# Patient Record
Sex: Female | Born: 1940 | Race: White | Hispanic: No | Smoking: Never smoker
Health system: Southern US, Community
[De-identification: ages and names within clinical notes are randomized; demographics above are authoritative.]

## PROBLEM LIST (undated history)

## (undated) DIAGNOSIS — C50919 Malignant neoplasm of unspecified site of unspecified female breast: Secondary | ICD-10-CM

## (undated) DIAGNOSIS — K5792 Diverticulitis of intestine, part unspecified, without perforation or abscess without bleeding: Secondary | ICD-10-CM

## (undated) DIAGNOSIS — E785 Hyperlipidemia, unspecified: Secondary | ICD-10-CM

## (undated) DIAGNOSIS — Z86010 Personal history of colonic polyps: Secondary | ICD-10-CM

## (undated) DIAGNOSIS — Z923 Personal history of irradiation: Secondary | ICD-10-CM

## (undated) DIAGNOSIS — K5790 Diverticulosis of intestine, part unspecified, without perforation or abscess without bleeding: Secondary | ICD-10-CM

## (undated) DIAGNOSIS — R011 Cardiac murmur, unspecified: Secondary | ICD-10-CM

## (undated) DIAGNOSIS — J302 Other seasonal allergic rhinitis: Secondary | ICD-10-CM

## (undated) DIAGNOSIS — R202 Paresthesia of skin: Secondary | ICD-10-CM

## (undated) DIAGNOSIS — I10 Essential (primary) hypertension: Secondary | ICD-10-CM

## (undated) DIAGNOSIS — Z87440 Personal history of urinary (tract) infections: Secondary | ICD-10-CM

## (undated) DIAGNOSIS — M810 Age-related osteoporosis without current pathological fracture: Secondary | ICD-10-CM

## (undated) DIAGNOSIS — J189 Pneumonia, unspecified organism: Secondary | ICD-10-CM

## (undated) HISTORY — DX: Diverticulitis of intestine, part unspecified, without perforation or abscess without bleeding: K57.92

## (undated) HISTORY — PX: DILATION AND CURETTAGE OF UTERUS: SHX78

## (undated) HISTORY — PX: ABDOMINAL SURGERY: SHX537

## (undated) HISTORY — DX: Diverticulosis of intestine, part unspecified, without perforation or abscess without bleeding: K57.90

## (undated) HISTORY — DX: Malignant neoplasm of unspecified site of unspecified female breast: C50.919

## (undated) HISTORY — DX: Age-related osteoporosis without current pathological fracture: M81.0

## (undated) HISTORY — DX: Hyperlipidemia, unspecified: E78.5

## (undated) HISTORY — DX: Essential (primary) hypertension: I10

## (undated) HISTORY — PX: TONSILLECTOMY AND ADENOIDECTOMY: SUR1326

## (undated) HISTORY — PX: BREAST LUMPECTOMY: SHX2

## (undated) HISTORY — DX: Personal history of colonic polyps: Z86.010

---

## 1963-03-30 HISTORY — PX: ABDOMINAL HYSTERECTOMY: SHX81

## 1999-08-25 ENCOUNTER — Other Ambulatory Visit: Admission: RE | Admit: 1999-08-25 | Discharge: 1999-08-25 | Payer: Self-pay | Admitting: *Deleted

## 1999-10-30 ENCOUNTER — Encounter: Payer: Self-pay | Admitting: Family Medicine

## 1999-10-30 ENCOUNTER — Encounter: Admission: RE | Admit: 1999-10-30 | Discharge: 1999-10-30 | Payer: Self-pay | Admitting: Family Medicine

## 2000-09-01 ENCOUNTER — Other Ambulatory Visit: Admission: RE | Admit: 2000-09-01 | Discharge: 2000-09-01 | Payer: Self-pay | Admitting: *Deleted

## 2000-11-01 ENCOUNTER — Encounter: Admission: RE | Admit: 2000-11-01 | Discharge: 2000-11-01 | Payer: Self-pay | Admitting: Family Medicine

## 2000-11-01 ENCOUNTER — Encounter: Payer: Self-pay | Admitting: Family Medicine

## 2002-03-29 HISTORY — PX: COLONOSCOPY: SHX174

## 2002-04-11 ENCOUNTER — Encounter: Admission: RE | Admit: 2002-04-11 | Discharge: 2002-04-11 | Payer: Self-pay | Admitting: *Deleted

## 2002-04-16 ENCOUNTER — Encounter (INDEPENDENT_AMBULATORY_CARE_PROVIDER_SITE_OTHER): Payer: Self-pay | Admitting: Specialist

## 2002-04-16 ENCOUNTER — Other Ambulatory Visit: Admission: RE | Admit: 2002-04-16 | Discharge: 2002-04-16 | Payer: Self-pay | Admitting: Diagnostic Radiology

## 2002-04-16 ENCOUNTER — Encounter: Admission: RE | Admit: 2002-04-16 | Discharge: 2002-04-16 | Payer: Self-pay | Admitting: *Deleted

## 2002-04-16 HISTORY — PX: BREAST BIOPSY: SHX20

## 2002-04-24 ENCOUNTER — Encounter: Payer: Self-pay | Admitting: Surgery

## 2002-04-24 ENCOUNTER — Ambulatory Visit (HOSPITAL_COMMUNITY): Admission: RE | Admit: 2002-04-24 | Discharge: 2002-04-24 | Payer: Self-pay | Admitting: Surgery

## 2002-05-02 DIAGNOSIS — Z853 Personal history of malignant neoplasm of breast: Secondary | ICD-10-CM | POA: Insufficient documentation

## 2002-05-02 DIAGNOSIS — C50212 Malignant neoplasm of upper-inner quadrant of left female breast: Secondary | ICD-10-CM

## 2002-05-04 ENCOUNTER — Encounter: Payer: Self-pay | Admitting: Surgery

## 2002-05-04 ENCOUNTER — Encounter: Admission: RE | Admit: 2002-05-04 | Discharge: 2002-05-04 | Payer: Self-pay | Admitting: Surgery

## 2002-05-07 ENCOUNTER — Ambulatory Visit (HOSPITAL_BASED_OUTPATIENT_CLINIC_OR_DEPARTMENT_OTHER): Admission: RE | Admit: 2002-05-07 | Discharge: 2002-05-07 | Payer: Self-pay | Admitting: Surgery

## 2002-05-07 ENCOUNTER — Encounter: Admission: RE | Admit: 2002-05-07 | Discharge: 2002-05-07 | Payer: Self-pay | Admitting: Surgery

## 2002-05-07 ENCOUNTER — Encounter: Payer: Self-pay | Admitting: Surgery

## 2002-05-07 ENCOUNTER — Encounter (INDEPENDENT_AMBULATORY_CARE_PROVIDER_SITE_OTHER): Payer: Self-pay | Admitting: *Deleted

## 2002-05-17 ENCOUNTER — Ambulatory Visit: Admission: RE | Admit: 2002-05-17 | Discharge: 2002-06-11 | Payer: Self-pay | Admitting: Radiation Oncology

## 2002-06-07 ENCOUNTER — Encounter: Payer: Self-pay | Admitting: Oncology

## 2002-06-07 ENCOUNTER — Ambulatory Visit (HOSPITAL_COMMUNITY): Admission: RE | Admit: 2002-06-07 | Discharge: 2002-06-07 | Payer: Self-pay | Admitting: Oncology

## 2002-06-15 ENCOUNTER — Ambulatory Visit (HOSPITAL_COMMUNITY): Admission: RE | Admit: 2002-06-15 | Discharge: 2002-09-13 | Payer: Self-pay | Admitting: Radiation Oncology

## 2002-06-28 DIAGNOSIS — Z923 Personal history of irradiation: Secondary | ICD-10-CM

## 2002-06-28 HISTORY — DX: Personal history of irradiation: Z92.3

## 2002-08-20 ENCOUNTER — Encounter: Admission: RE | Admit: 2002-08-20 | Discharge: 2002-11-18 | Payer: Self-pay | Admitting: Radiation Oncology

## 2002-09-19 ENCOUNTER — Encounter: Payer: Self-pay | Admitting: Oncology

## 2002-09-19 ENCOUNTER — Ambulatory Visit (HOSPITAL_COMMUNITY): Admission: RE | Admit: 2002-09-19 | Discharge: 2002-09-19 | Payer: Self-pay | Admitting: Oncology

## 2003-03-06 ENCOUNTER — Encounter: Admission: RE | Admit: 2003-03-06 | Discharge: 2003-03-06 | Payer: Self-pay | Admitting: Oncology

## 2003-04-09 ENCOUNTER — Other Ambulatory Visit: Admission: RE | Admit: 2003-04-09 | Discharge: 2003-04-09 | Payer: Self-pay | Admitting: Obstetrics and Gynecology

## 2003-04-25 ENCOUNTER — Ambulatory Visit: Admission: RE | Admit: 2003-04-25 | Discharge: 2003-04-25 | Payer: Self-pay | Admitting: Radiation Oncology

## 2004-03-16 ENCOUNTER — Encounter: Admission: RE | Admit: 2004-03-16 | Discharge: 2004-03-16 | Payer: Self-pay | Admitting: Oncology

## 2004-03-25 ENCOUNTER — Ambulatory Visit: Payer: Self-pay | Admitting: Oncology

## 2004-07-31 ENCOUNTER — Ambulatory Visit: Payer: Self-pay | Admitting: Oncology

## 2004-10-12 ENCOUNTER — Ambulatory Visit: Payer: Self-pay | Admitting: Internal Medicine

## 2004-10-27 ENCOUNTER — Ambulatory Visit: Payer: Self-pay | Admitting: Family Medicine

## 2004-11-16 ENCOUNTER — Encounter: Admission: RE | Admit: 2004-11-16 | Discharge: 2005-02-14 | Payer: Self-pay | Admitting: Internal Medicine

## 2004-12-14 ENCOUNTER — Emergency Department (HOSPITAL_COMMUNITY): Admission: EM | Admit: 2004-12-14 | Discharge: 2004-12-14 | Payer: Self-pay | Admitting: Emergency Medicine

## 2005-02-16 ENCOUNTER — Ambulatory Visit: Payer: Self-pay | Admitting: Oncology

## 2005-03-17 ENCOUNTER — Encounter: Admission: RE | Admit: 2005-03-17 | Discharge: 2005-03-17 | Payer: Self-pay | Admitting: Oncology

## 2005-06-29 ENCOUNTER — Encounter: Admission: RE | Admit: 2005-06-29 | Discharge: 2005-06-29 | Payer: Self-pay | Admitting: Surgery

## 2005-08-10 ENCOUNTER — Ambulatory Visit: Payer: Self-pay | Admitting: Oncology

## 2005-08-12 LAB — CBC WITH DIFFERENTIAL/PLATELET
Basophils Absolute: 0.1 10*3/uL (ref 0.0–0.1)
EOS%: 2.7 % (ref 0.0–7.0)
Eosinophils Absolute: 0.2 10*3/uL (ref 0.0–0.5)
HCT: 37.8 % (ref 34.8–46.6)
HGB: 13 g/dL (ref 11.6–15.9)
MCH: 29.1 pg (ref 26.0–34.0)
MONO#: 0.6 10*3/uL (ref 0.1–0.9)
NEUT#: 4.6 10*3/uL (ref 1.5–6.5)
NEUT%: 62.3 % (ref 39.6–76.8)
RDW: 15.9 % — ABNORMAL HIGH (ref 11.3–14.5)
WBC: 7.4 10*3/uL (ref 3.9–10.0)
lymph#: 1.9 10*3/uL (ref 0.9–3.3)

## 2005-08-12 LAB — COMPREHENSIVE METABOLIC PANEL
AST: 15 U/L (ref 0–37)
Albumin: 4.3 g/dL (ref 3.5–5.2)
Alkaline Phosphatase: 93 U/L (ref 39–117)
Calcium: 9.5 mg/dL (ref 8.4–10.5)
Chloride: 100 mEq/L (ref 96–112)
Glucose, Bld: 96 mg/dL (ref 70–99)
Potassium: 4 mEq/L (ref 3.5–5.3)
Sodium: 140 mEq/L (ref 135–145)
Total Protein: 7.3 g/dL (ref 6.0–8.3)

## 2006-02-09 ENCOUNTER — Ambulatory Visit: Payer: Self-pay | Admitting: Oncology

## 2006-02-09 ENCOUNTER — Ambulatory Visit: Payer: Self-pay | Admitting: Internal Medicine

## 2006-02-09 LAB — CONVERTED CEMR LAB
ALT: 16 units/L (ref 0–40)
AST: 17 units/L (ref 0–37)
BUN: 17 mg/dL (ref 6–23)
Basophils Relative: 0.9 % (ref 0.0–1.0)
Eosinophil percent: 2.8 % (ref 0.0–5.0)
Hgb A1c MFr Bld: 6.5 % — ABNORMAL HIGH (ref 4.6–6.0)
Lymphocytes Relative: 25.4 % (ref 12.0–46.0)
Microalb Creat Ratio: 2.3 mg/g (ref 0.0–30.0)
Neutro Abs: 4.7 10*3/uL (ref 1.4–7.7)
Platelets: 312 10*3/uL (ref 150–400)
RBC: 4.52 M/uL (ref 3.87–5.11)
RDW: 14 % (ref 11.5–14.6)
WBC: 7.3 10*3/uL (ref 4.5–10.5)

## 2006-02-14 LAB — CBC WITH DIFFERENTIAL/PLATELET
Basophils Absolute: 0 10*3/uL (ref 0.0–0.1)
EOS%: 2 % (ref 0.0–7.0)
Eosinophils Absolute: 0.2 10*3/uL (ref 0.0–0.5)
HCT: 35 % (ref 34.8–46.6)
HGB: 12 g/dL (ref 11.6–15.9)
MCH: 29.5 pg (ref 26.0–34.0)
MCV: 86.1 fL (ref 81.0–101.0)
NEUT#: 5.4 10*3/uL (ref 1.5–6.5)
NEUT%: 68.5 % (ref 39.6–76.8)
RDW: 14.7 % — ABNORMAL HIGH (ref 11.3–14.5)
lymph#: 1.7 10*3/uL (ref 0.9–3.3)

## 2006-02-14 LAB — LACTATE DEHYDROGENASE: LDH: 156 U/L (ref 94–250)

## 2006-02-14 LAB — COMPREHENSIVE METABOLIC PANEL
BUN: 18 mg/dL (ref 6–23)
CO2: 30 mEq/L (ref 19–32)
Calcium: 9.3 mg/dL (ref 8.4–10.5)
Chloride: 103 mEq/L (ref 96–112)
Creatinine, Ser: 0.75 mg/dL (ref 0.40–1.20)
Glucose, Bld: 114 mg/dL — ABNORMAL HIGH (ref 70–99)
Total Bilirubin: 0.3 mg/dL (ref 0.3–1.2)

## 2006-03-02 ENCOUNTER — Ambulatory Visit: Payer: Self-pay | Admitting: Internal Medicine

## 2006-03-17 ENCOUNTER — Encounter: Admission: RE | Admit: 2006-03-17 | Discharge: 2006-03-17 | Payer: Self-pay | Admitting: Oncology

## 2006-06-02 ENCOUNTER — Ambulatory Visit: Payer: Self-pay | Admitting: Internal Medicine

## 2006-06-02 LAB — CONVERTED CEMR LAB
ALT: 16 units/L (ref 0–40)
AST: 18 units/L (ref 0–37)
Hgb A1c MFr Bld: 6.6 % — ABNORMAL HIGH (ref 4.6–6.0)
Total CHOL/HDL Ratio: 3.9
Triglycerides: 151 mg/dL — ABNORMAL HIGH (ref 0–149)
VLDL: 30 mg/dL (ref 0–40)

## 2006-06-10 ENCOUNTER — Ambulatory Visit: Payer: Self-pay | Admitting: Internal Medicine

## 2006-08-19 ENCOUNTER — Ambulatory Visit: Payer: Self-pay | Admitting: Oncology

## 2006-08-24 LAB — COMPREHENSIVE METABOLIC PANEL
BUN: 17 mg/dL (ref 6–23)
CO2: 24 mEq/L (ref 19–32)
Creatinine, Ser: 0.68 mg/dL (ref 0.40–1.20)
Glucose, Bld: 98 mg/dL (ref 70–99)
Total Bilirubin: 0.3 mg/dL (ref 0.3–1.2)

## 2006-08-24 LAB — CBC WITH DIFFERENTIAL/PLATELET
BASO%: 0.5 % (ref 0.0–2.0)
Basophils Absolute: 0 10*3/uL (ref 0.0–0.1)
Eosinophils Absolute: 0.2 10*3/uL (ref 0.0–0.5)
HCT: 36.2 % (ref 34.8–46.6)
LYMPH%: 23.1 % (ref 14.0–48.0)
MCHC: 34.5 g/dL (ref 32.0–36.0)
MONO#: 0.6 10*3/uL (ref 0.1–0.9)
NEUT%: 64.7 % (ref 39.6–76.8)
Platelets: 250 10*3/uL (ref 145–400)
WBC: 6.7 10*3/uL (ref 3.9–10.0)

## 2006-08-24 LAB — LACTATE DEHYDROGENASE: LDH: 166 U/L (ref 94–250)

## 2006-08-24 LAB — CANCER ANTIGEN 27.29: CA 27.29: 9 U/mL (ref 0–39)

## 2006-08-31 ENCOUNTER — Encounter: Payer: Self-pay | Admitting: Internal Medicine

## 2006-09-13 ENCOUNTER — Ambulatory Visit: Payer: Self-pay | Admitting: Internal Medicine

## 2006-09-16 ENCOUNTER — Encounter (INDEPENDENT_AMBULATORY_CARE_PROVIDER_SITE_OTHER): Payer: Self-pay | Admitting: *Deleted

## 2006-09-16 LAB — CONVERTED CEMR LAB
Creatinine,U: 179.7 mg/dL
Hgb A1c MFr Bld: 6.5 % — ABNORMAL HIGH (ref 4.6–6.0)
LDL Cholesterol: 98 mg/dL (ref 0–99)
Microalb Creat Ratio: 3.3 mg/g (ref 0.0–30.0)
VLDL: 34 mg/dL (ref 0–40)

## 2006-10-03 ENCOUNTER — Ambulatory Visit: Payer: Self-pay | Admitting: Internal Medicine

## 2006-10-25 ENCOUNTER — Encounter: Payer: Self-pay | Admitting: Internal Medicine

## 2006-10-27 ENCOUNTER — Telehealth (INDEPENDENT_AMBULATORY_CARE_PROVIDER_SITE_OTHER): Payer: Self-pay | Admitting: *Deleted

## 2006-11-25 ENCOUNTER — Encounter (INDEPENDENT_AMBULATORY_CARE_PROVIDER_SITE_OTHER): Payer: Self-pay | Admitting: *Deleted

## 2006-11-29 ENCOUNTER — Encounter: Admission: RE | Admit: 2006-11-29 | Discharge: 2006-11-29 | Payer: Self-pay | Admitting: Oncology

## 2006-11-29 ENCOUNTER — Ambulatory Visit: Payer: Self-pay | Admitting: Internal Medicine

## 2006-12-02 ENCOUNTER — Encounter (INDEPENDENT_AMBULATORY_CARE_PROVIDER_SITE_OTHER): Payer: Self-pay | Admitting: *Deleted

## 2007-02-06 ENCOUNTER — Ambulatory Visit: Payer: Self-pay | Admitting: Internal Medicine

## 2007-02-06 LAB — CONVERTED CEMR LAB
AST: 19 units/L (ref 0–37)
BUN: 14 mg/dL (ref 6–23)
Cholesterol: 196 mg/dL (ref 0–200)
HDL: 62.4 mg/dL (ref >39.0)
LDL Cholesterol: 96 mg/dL (ref 0–99)
Potassium: 4 meq/L (ref 3.5–5.1)
Triglycerides: 188 mg/dL — ABNORMAL HIGH (ref 0–149)
VLDL: 38 mg/dL (ref 0–40)

## 2007-02-10 DIAGNOSIS — I10 Essential (primary) hypertension: Secondary | ICD-10-CM | POA: Insufficient documentation

## 2007-02-10 DIAGNOSIS — Z9089 Acquired absence of other organs: Secondary | ICD-10-CM | POA: Insufficient documentation

## 2007-02-10 DIAGNOSIS — I152 Hypertension secondary to endocrine disorders: Secondary | ICD-10-CM | POA: Insufficient documentation

## 2007-02-13 ENCOUNTER — Ambulatory Visit: Payer: Self-pay | Admitting: Internal Medicine

## 2007-02-13 DIAGNOSIS — R7303 Prediabetes: Secondary | ICD-10-CM | POA: Insufficient documentation

## 2007-02-15 ENCOUNTER — Ambulatory Visit: Payer: Self-pay | Admitting: Oncology

## 2007-02-17 LAB — COMPREHENSIVE METABOLIC PANEL
AST: 15 U/L (ref 0–37)
Alkaline Phosphatase: 84 U/L (ref 39–117)
BUN: 21 mg/dL (ref 6–23)
Creatinine, Ser: 0.73 mg/dL (ref 0.40–1.20)
Potassium: 4.1 mEq/L (ref 3.5–5.3)
Total Bilirubin: 0.3 mg/dL (ref 0.3–1.2)

## 2007-02-17 LAB — CBC WITH DIFFERENTIAL/PLATELET
Basophils Absolute: 0 10*3/uL (ref 0.0–0.1)
EOS%: 2.1 % (ref 0.0–7.0)
Eosinophils Absolute: 0.2 10*3/uL (ref 0.0–0.5)
HGB: 12.2 g/dL (ref 11.6–15.9)
NEUT#: 4.7 10*3/uL (ref 1.5–6.5)
RDW: 15.4 % — ABNORMAL HIGH (ref 11.3–14.5)
lymph#: 1.8 10*3/uL (ref 0.9–3.3)

## 2007-02-21 ENCOUNTER — Encounter: Payer: Self-pay | Admitting: Internal Medicine

## 2007-02-21 LAB — VITAMIN D PNL(25-HYDRXY+1,25-DIHY)-BLD
Vit D, 1,25-Dihydroxy: 33 pg/mL (ref 6–62)
Vit D, 25-Hydroxy: 39 ng/mL (ref 30–89)

## 2007-03-20 ENCOUNTER — Encounter: Admission: RE | Admit: 2007-03-20 | Discharge: 2007-03-20 | Payer: Self-pay | Admitting: Oncology

## 2007-05-08 ENCOUNTER — Encounter: Payer: Self-pay | Admitting: Internal Medicine

## 2007-07-19 ENCOUNTER — Telehealth (INDEPENDENT_AMBULATORY_CARE_PROVIDER_SITE_OTHER): Payer: Self-pay | Admitting: *Deleted

## 2007-08-01 ENCOUNTER — Encounter: Payer: Self-pay | Admitting: Internal Medicine

## 2007-08-01 ENCOUNTER — Telehealth (INDEPENDENT_AMBULATORY_CARE_PROVIDER_SITE_OTHER): Payer: Self-pay | Admitting: *Deleted

## 2007-08-09 ENCOUNTER — Ambulatory Visit: Payer: Self-pay | Admitting: Internal Medicine

## 2007-08-09 LAB — CONVERTED CEMR LAB
Cholesterol: 209 mg/dL (ref 0–200)
Total CHOL/HDL Ratio: 3.6
Triglycerides: 131 mg/dL (ref 0–149)
VLDL: 26 mg/dL (ref 0–40)

## 2007-08-16 ENCOUNTER — Ambulatory Visit: Payer: Self-pay | Admitting: Internal Medicine

## 2007-08-16 DIAGNOSIS — T887XXA Unspecified adverse effect of drug or medicament, initial encounter: Secondary | ICD-10-CM | POA: Insufficient documentation

## 2007-08-16 LAB — CONVERTED CEMR LAB
Cholesterol, target level: 200 mg/dL
LDL Goal: 100 mg/dL

## 2007-08-17 ENCOUNTER — Ambulatory Visit: Payer: Self-pay | Admitting: Oncology

## 2007-09-01 ENCOUNTER — Encounter: Payer: Self-pay | Admitting: Internal Medicine

## 2007-12-18 ENCOUNTER — Ambulatory Visit: Payer: Self-pay | Admitting: Internal Medicine

## 2007-12-25 ENCOUNTER — Ambulatory Visit: Payer: Self-pay | Admitting: Internal Medicine

## 2007-12-25 DIAGNOSIS — E1169 Type 2 diabetes mellitus with other specified complication: Secondary | ICD-10-CM | POA: Insufficient documentation

## 2007-12-25 DIAGNOSIS — E785 Hyperlipidemia, unspecified: Secondary | ICD-10-CM | POA: Insufficient documentation

## 2007-12-25 LAB — CONVERTED CEMR LAB
Cholesterol: 182 mg/dL (ref 0–200)
Creatinine, Ser: 0.7 mg/dL (ref 0.4–1.2)
HDL: 59.9 mg/dL (ref >39.0)
Total CHOL/HDL Ratio: 3
Triglycerides: 128 mg/dL (ref 0–149)

## 2008-03-26 ENCOUNTER — Encounter: Admission: RE | Admit: 2008-03-26 | Discharge: 2008-03-26 | Payer: Self-pay | Admitting: Oncology

## 2008-04-16 ENCOUNTER — Encounter: Payer: Self-pay | Admitting: Internal Medicine

## 2008-04-23 ENCOUNTER — Ambulatory Visit: Payer: Self-pay | Admitting: Internal Medicine

## 2008-04-28 LAB — CONVERTED CEMR LAB: Hgb A1c MFr Bld: 6.5 % — ABNORMAL HIGH (ref 4.6–6.0)

## 2008-04-29 ENCOUNTER — Encounter: Payer: Self-pay | Admitting: Internal Medicine

## 2008-05-01 ENCOUNTER — Encounter: Payer: Self-pay | Admitting: Internal Medicine

## 2008-08-22 ENCOUNTER — Ambulatory Visit: Payer: Self-pay | Admitting: Internal Medicine

## 2008-08-24 LAB — CONVERTED CEMR LAB: Hgb A1c MFr Bld: 6.4 % (ref 4.6–6.5)

## 2008-08-29 ENCOUNTER — Encounter (INDEPENDENT_AMBULATORY_CARE_PROVIDER_SITE_OTHER): Payer: Self-pay | Admitting: *Deleted

## 2008-09-06 ENCOUNTER — Ambulatory Visit: Payer: Self-pay | Admitting: Oncology

## 2008-09-06 LAB — CBC WITH DIFFERENTIAL/PLATELET
Basophils Absolute: 0 10*3/uL (ref 0.0–0.1)
EOS%: 3.5 % (ref 0.0–7.0)
Eosinophils Absolute: 0.2 10*3/uL (ref 0.0–0.5)
LYMPH%: 26 % (ref 14.0–49.7)
MCH: 29.7 pg (ref 25.1–34.0)
MCV: 85.6 fL (ref 79.5–101.0)
MONO%: 8.5 % (ref 0.0–14.0)
NEUT#: 3.9 10*3/uL (ref 1.5–6.5)
Platelets: 215 10*3/uL (ref 145–400)
RBC: 4.12 10*6/uL (ref 3.70–5.45)

## 2008-09-09 LAB — COMPREHENSIVE METABOLIC PANEL
AST: 17 U/L (ref 0–37)
Alkaline Phosphatase: 67 U/L (ref 39–117)
BUN: 21 mg/dL (ref 6–23)
Glucose, Bld: 85 mg/dL (ref 70–99)
Sodium: 142 mEq/L (ref 135–145)
Total Bilirubin: 0.5 mg/dL (ref 0.3–1.2)

## 2008-09-09 LAB — VITAMIN D 25 HYDROXY (VIT D DEFICIENCY, FRACTURES): Vit D, 25-Hydroxy: 38 ng/mL (ref 30–89)

## 2008-09-13 ENCOUNTER — Encounter: Payer: Self-pay | Admitting: Internal Medicine

## 2008-11-06 ENCOUNTER — Telehealth (INDEPENDENT_AMBULATORY_CARE_PROVIDER_SITE_OTHER): Payer: Self-pay | Admitting: *Deleted

## 2008-12-25 ENCOUNTER — Ambulatory Visit: Payer: Self-pay | Admitting: Internal Medicine

## 2008-12-25 LAB — CONVERTED CEMR LAB
ALT: 16 units/L (ref 0–35)
AST: 21 units/L (ref 0–37)
Albumin: 3.8 g/dL (ref 3.5–5.2)
Alkaline Phosphatase: 72 units/L (ref 39–117)
Cholesterol: 177 mg/dL (ref 0–200)
Creatinine, Ser: 0.8 mg/dL (ref 0.4–1.2)
Creatinine,U: 208.8 mg/dL
HDL: 57.4 mg/dL (ref >39.00)
Microalb Creat Ratio: 4.8 mg/g (ref 0.0–30.0)
TSH: 1.55 microintl units/mL (ref 0.35–5.50)
Total Protein: 7.3 g/dL (ref 6.0–8.3)
Triglycerides: 112 mg/dL (ref 0.0–149.0)

## 2009-01-02 ENCOUNTER — Ambulatory Visit: Payer: Self-pay | Admitting: Internal Medicine

## 2009-03-27 ENCOUNTER — Encounter: Admission: RE | Admit: 2009-03-27 | Discharge: 2009-03-27 | Payer: Self-pay | Admitting: Oncology

## 2009-04-18 ENCOUNTER — Encounter: Payer: Self-pay | Admitting: Internal Medicine

## 2009-07-03 ENCOUNTER — Ambulatory Visit: Payer: Self-pay | Admitting: Internal Medicine

## 2009-07-07 LAB — CONVERTED CEMR LAB: Hgb A1c MFr Bld: 6.2 % (ref 4.6–6.5)

## 2009-08-21 ENCOUNTER — Encounter: Payer: Self-pay | Admitting: Internal Medicine

## 2009-09-03 ENCOUNTER — Ambulatory Visit: Payer: Self-pay | Admitting: Oncology

## 2009-09-05 LAB — CBC WITH DIFFERENTIAL/PLATELET
BASO%: 0.3 % (ref 0.0–2.0)
Eosinophils Absolute: 0.2 10*3/uL (ref 0.0–0.5)
HCT: 35.5 % (ref 34.8–46.6)
MCHC: 34 g/dL (ref 31.5–36.0)
MONO#: 0.5 10*3/uL (ref 0.1–0.9)
NEUT#: 3.8 10*3/uL (ref 1.5–6.5)
NEUT%: 65.2 % (ref 38.4–76.8)
RBC: 4.08 10*6/uL (ref 3.70–5.45)
WBC: 5.9 10*3/uL (ref 3.9–10.3)
lymph#: 1.4 10*3/uL (ref 0.9–3.3)

## 2009-09-05 LAB — COMPREHENSIVE METABOLIC PANEL
ALT: 13 U/L (ref 0–35)
CO2: 25 mEq/L (ref 19–32)
Calcium: 9.5 mg/dL (ref 8.4–10.5)
Chloride: 104 mEq/L (ref 96–112)
Glucose, Bld: 104 mg/dL — ABNORMAL HIGH (ref 70–99)
Sodium: 139 mEq/L (ref 135–145)
Total Protein: 6.7 g/dL (ref 6.0–8.3)

## 2009-09-05 LAB — LACTATE DEHYDROGENASE: LDH: 159 U/L (ref 94–250)

## 2009-09-12 ENCOUNTER — Encounter: Payer: Self-pay | Admitting: Internal Medicine

## 2010-01-08 ENCOUNTER — Ambulatory Visit: Payer: Self-pay | Admitting: Internal Medicine

## 2010-01-19 LAB — CONVERTED CEMR LAB: Hgb A1c MFr Bld: 6.5 % (ref 4.6–6.5)

## 2010-01-28 ENCOUNTER — Ambulatory Visit: Payer: Self-pay | Admitting: Internal Medicine

## 2010-01-28 DIAGNOSIS — M1990 Unspecified osteoarthritis, unspecified site: Secondary | ICD-10-CM | POA: Insufficient documentation

## 2010-01-30 LAB — CONVERTED CEMR LAB: Uric Acid, Serum: 5.9 mg/dL (ref 2.4–7.0)

## 2010-03-10 ENCOUNTER — Telehealth (INDEPENDENT_AMBULATORY_CARE_PROVIDER_SITE_OTHER): Payer: Self-pay | Admitting: *Deleted

## 2010-04-02 ENCOUNTER — Encounter
Admission: RE | Admit: 2010-04-02 | Discharge: 2010-04-02 | Payer: Self-pay | Source: Home / Self Care | Attending: Oncology | Admitting: Oncology

## 2010-04-23 ENCOUNTER — Encounter: Payer: Self-pay | Admitting: Internal Medicine

## 2010-04-28 NOTE — Letter (Signed)
Summary: [facility]   Imported By: Lanelle Bal 05/06/2009 09:19:29  _____________________________________________________________________  External Attachment:    Type:   Image     Comment:   External Document

## 2010-04-28 NOTE — Letter (Signed)
[facility]   Imported By: Lanelle Bal 10/06/2009 09:09:00  _____________________________________________________________________  External Attachment:    Type:   Image     Comment:   External Document

## 2010-04-28 NOTE — Letter (Signed)
Summary: Tiffany Hood OD  Tiffany Hood OD   Imported By: Lanelle Bal 08/30/2009 09:38:38  _____________________________________________________________________  External Attachment:    Type:   Image     Comment:   External Document

## 2010-04-28 NOTE — Assessment & Plan Note (Signed)
Summary: review meds and glucose readings, fasting labs///sph   Vital Signs:  Patient profile:   70 year old female Height:      63 inches Weight:      152.4 pounds BMI:     27.09 Pulse rate:   72 / minute Resp:     15 per minute BP sitting:   118 / 70  (left arm) Cuff size:   large  Vitals Entered By: Shonna Chock CMA (January 28, 2010 9:30 AM) CC: Follow-up visit: Discuss meds and bloodsugar readings, fasting if any labs due, Type 2 diabetes mellitus follow-up Comments **Off Actos since Aug due to Info released concerning- relation to Bladder Cancer   CC:  Follow-up visit: Discuss meds and bloodsugar readings, fasting if any labs due, and Type 2 diabetes mellitus follow-up.  History of Present Illness: Type 2 Diabetes Mellitus Follow-Up      This is a 70 year old woman who presents for Type 2 diabetes mellitus follow-up.  The patient reports polyuria ( ? from diuretic) and weight loss ( 4-5# with diet change & exercise), but denies polydipsia, blurred vision, self managed hypoglycemia, and numbness of extremities.  The patient denies the following symptoms: neuropathic pain, chest pain, vomiting, orthostatic symptoms, poor wound healing, intermittent claudication, vision loss, and foot ulcer.  Since the last visit the patient reports good dietary compliance and exercising regularly(3X/ week as [employer]).  The patient has been measuring capillary blood glucose before breakfast( 92-117).  Since the last visit, the patient reports having had eye care by an Ophthalmologist ( no retinopathy) but  no foot care.  She is concerned about a "hereditary lump" ( F,bro, sister all had this; none had gout) which hurts occasionally. A1c 6.5% on 01/08/2010; it was 6.2% six months ago.She D/Ced Actos 08/2011because of possible bladder cancer from Actos . She has had breast cancer.  Allergies (verified): No Known Drug Allergies  Review of Systems MS:  Complains of joint pain, joint redness, and joint  swelling; Rx: massage .  Physical Exam  General:  well-nourished,in no acute distress; alert,appropriate and cooperative throughout examination Lungs:  Normal respiratory effort, chest expands symmetrically. Lungs are clear to auscultation, no crackles or wheezes. Heart:  normal rate, regular rhythm, no gallop, no rub, no JVD, no HJR, and grade 1 /6 systolic murmur.   Abdomen:  Bowel sounds positive,abdomen soft and non-tender without masses, organomegaly or hernias noted. No AAA or bruits Pulses:  R and L carotid,radial,dorsalis pedis and posterior tibial pulses are full and equal bilaterally Extremities:  No clubbing, cyanosis, edema. Classic OA hand changes. Minimal crepitus of kness . Full ROM . Osteoma  L great toe. Pes planus. Good nail health Neurologic:  alert & oriented X3 and sensation intact to light touch over feet.   Skin:  Intact without suspicious lesions or rashes Psych:  memory intact for recent and remote, normally interactive, and good eye contact.     Impression & Recommendations:  Problem # 1:  DIABETES MELLITUS, TYPE II, CONTROLLED (ICD-250.00)  The following medications were removed from the medication list:    Actos 30 Mg Tabs (Pioglitazone hcl) .Marland Kitchen... 1 by mouth qd Her updated medication list for this problem includes:    Cozaar 50 Mg Tabs (Losartan potassium) .Marland Kitchen... 1 by mouth qd    Adult Aspirin Low Strength 81 Mg Tbdp (Aspirin) .Marland Kitchen... 1 on m,w,f  Problem # 2:  ARTHRALGIA (ICD-719.40)  R/O gout ( doubt); probabled benign Osteoma   Orders: Venipuncture (  16109) TLB-Uric Acid, Blood (84550-URIC) T-Toe(s) (73660TC)  Complete Medication List: 1)  Triamterene-hctz 75-50 Mg Tabs (Triamterene-hctz) .... 1/2 tab daily 2)  Cozaar 50 Mg Tabs (Losartan potassium) .Marland Kitchen.. 1 by mouth qd 3)  Adult Aspirin Low Strength 81 Mg Tbdp (Aspirin) .Marland Kitchen.. 1 on m,w,f 4)  Arimidex 1 Mg Tabs (Anastrozole) .Marland Kitchen.. 1 by mouth qd 5)  Pravastatin Sodium 40 Mg Tabs (Pravastatin sodium) .Marland Kitchen.. 1  by mouth once daily **appointment due** 6)  Caltate With Vit D  7)  Vitamin E 400 Iu  8)  Folic Acid 1 Mg Tabs (Folic acid) .Marland Kitchen.. 1 by mouth once daily 9)  Multivitamin  10)  Fish Oil 1gram Qd  11)  Onetouch Ultra Test Strp (Glucose blood) .... Use as directed once daily 12)  Onetouch Ultrasoft Lancets Misc (Lancets) .... Use as directed once daily 13)  Phisohex 3 % Liqd (Hexachlorophene) .... Mouthwash: use two times a day for tooth implant  Patient Instructions: 1)  Consume < 30 grams of HFCS sugar/ day. Wear support shoes with arch supports. 2)  Please schedule a follow-up appointment in 3 months. 3)  BUN,creat, K+ prior to visit, ICD-9:401.9 4)  Lipid Panel prior to visit, ICD-9:272.4 5)  HbgA1C prior to visit, ICD-9:250.00 6)  Hepatic Panel prior to visit, ICD-9:995.20 7)  TSH prior to visit, ICD-9:244.9.   Orders Added: 1)  Est. Patient Level IV [60454] 2)  Venipuncture [09811] 3)  TLB-Uric Acid, Blood [84550-URIC] 4)  T-Toe(s) [73660TC]

## 2010-04-30 ENCOUNTER — Other Ambulatory Visit: Payer: Self-pay | Admitting: Internal Medicine

## 2010-04-30 ENCOUNTER — Other Ambulatory Visit (INDEPENDENT_AMBULATORY_CARE_PROVIDER_SITE_OTHER): Payer: Medicare Other

## 2010-04-30 ENCOUNTER — Ambulatory Visit: Admit: 2010-04-30 | Payer: Self-pay | Admitting: Internal Medicine

## 2010-04-30 ENCOUNTER — Encounter (INDEPENDENT_AMBULATORY_CARE_PROVIDER_SITE_OTHER): Payer: Self-pay | Admitting: *Deleted

## 2010-04-30 ENCOUNTER — Other Ambulatory Visit: Payer: Self-pay | Admitting: Surgery

## 2010-04-30 DIAGNOSIS — E785 Hyperlipidemia, unspecified: Secondary | ICD-10-CM

## 2010-04-30 DIAGNOSIS — Z9889 Other specified postprocedural states: Secondary | ICD-10-CM

## 2010-04-30 DIAGNOSIS — E119 Type 2 diabetes mellitus without complications: Secondary | ICD-10-CM

## 2010-04-30 DIAGNOSIS — E039 Hypothyroidism, unspecified: Secondary | ICD-10-CM

## 2010-04-30 LAB — CREATININE, SERUM: Creatinine, Ser: 0.6 mg/dL (ref 0.4–1.2)

## 2010-04-30 LAB — LDL CHOLESTEROL, DIRECT: Direct LDL: 98 mg/dL

## 2010-04-30 LAB — HEPATIC FUNCTION PANEL
Alkaline Phosphatase: 83 U/L (ref 39–117)
Bilirubin, Direct: 0.1 mg/dL (ref 0.0–0.3)
Total Protein: 7.1 g/dL (ref 6.0–8.3)

## 2010-04-30 LAB — LIPID PANEL
HDL: 57.8 mg/dL (ref >39.00)
Total CHOL/HDL Ratio: 3
VLDL: 46.6 mg/dL — ABNORMAL HIGH (ref 0.0–40.0)

## 2010-04-30 LAB — HEMOGLOBIN A1C: Hgb A1c MFr Bld: 6.6 % — ABNORMAL HIGH (ref 4.6–6.5)

## 2010-04-30 NOTE — Progress Notes (Signed)
Summary: Losartan 90 day prescription plus refills  Phone Note Refill Request Message from:  Patient on March 10, 2010 11:51 AM  Refills Requested: Medication #1:  COZAAR 50 MG  TABS 1 by mouth qd needs 90 day prescription plus refills----uses mail order pharmacy, but prefers to pick up prescription and mail it herself---I told her it would be ready in 24 hours (Wednesday after lunch) unless she heard from [place]  Next Appointment Scheduled: Thurs 04/30/2010--lab only Initial call taken by: Jerolyn Shin,  March 10, 2010 11:53 AM    Prescriptions: COZAAR 50 MG  TABS (LOSARTAN POTASSIUM) 1 by mouth qd  #90 x 2   Entered by:   Shonna Chock CMA   Authorized by:   Marga Melnick MD   Signed by:   Shonna Chock CMA on 03/10/2010   Method used:   Print then Give to Patient   RxID:   [id number]

## 2010-05-07 ENCOUNTER — Ambulatory Visit
Admission: RE | Admit: 2010-05-07 | Discharge: 2010-05-07 | Disposition: A | Discharge status: Home / Self Care | Payer: Medicare Other | Source: Ambulatory Visit | Attending: Surgery | Admitting: Surgery

## 2010-05-07 DIAGNOSIS — Z9889 Other specified postprocedural states: Secondary | ICD-10-CM

## 2010-05-07 MED ORDER — GADOBENATE DIMEGLUMINE 529 MG/ML IV SOLN
13.0000 mL | Freq: Once | INTRAVENOUS | Status: AC | PRN
  Administered 2010-05-07: 13 mL via INTRAVENOUS

## 2010-05-20 NOTE — Letter (Signed)
Summary: [facility]   Imported By: Maryln Gottron 05/11/2010 13:38:13  _____________________________________________________________________  External Attachment:    Type:   Image     Comment:   External Document

## 2010-08-14 NOTE — Op Note (Signed)
NAME:  Tiffany Hood, Tiffany Hood                        ACCOUNT NO.:  [id number]   MEDICAL RECORD NO.:  [id number]                   PATIENT TYPE:  AMB   LOCATION:  DSC                                  FACILITY:  [facility]   PHYSICIAN:  Currie Paris, M.D.           DATE OF BIRTH:  Aug 13, 1940   DATE OF PROCEDURE:  05/07/2002  DATE OF DISCHARGE:                                 OPERATIVE REPORT   CCS:  #[id number]   PREOPERATIVE DIAGNOSIS:  Carcinoma of the left breast, upper inner quadrant.   POSTOPERATIVE DIAGNOSIS:  Carcinoma of the left breast, upper inner  quadrant.   OPERATION:  Needle guided excision, left breast cancer, with blue dye  injection and sentinel node dissection.   SURGEON:  Currie Paris, M.D.   ANESTHESIA:  General (LMA).   INDICATIONS FOR PROCEDURE:  This patient is a 70 year old who had a  mammogram and ultrasound finding of a mass which core biopsy showed to be a  small invasive cancer.  An MRI showed an adjacent 4.0 mm area that was  thought to be associated.  After a discussion with the patient, she elected  to proceed to a needle guided wide local excision with sentinel node  evaluation.   DESCRIPTION OF PROCEDURE:  The patient was seen in the holding area and had  no further questions.  The radioactive isotope had been injected, as had the  guide wire been placed.  An X marked the area on the breast where the mass  was noted to be on ultrasound.  Preoperatively the patient had no further  questions, so she was taken to the operating room, and after satisfactory  general anesthesia, I injected some Lymphazurin blue, 4 mL, circumareolarly.  The breast was then prepped and draped.  Using the Neoprobe, I identified a  hot area in the axilla and marked it.  I did not see any hot air areas near  the sternum.  I made an elliptical incision directly over the mass and did a  wide excision using cautery.  I went about 2.0 cm above the wire in the  breast tissue,  and then down to the chest wall.  Then I came around medially  down to the chest wall, then inferiorly, going at least 2.0 cm beyond or  inferior to where the guide wire came and then laterally just at the edge of  the incision, since I thought I had the area encompassed nicely.  This  therefore was a complete excision of skin and subcutaneous tissue, breast  down to chest wall.  The mass was not definitely palpable within the  specimen, but I was beyond the tip of the guide wire and felt that we had  good margins.  While waiting for specimen mammography, I injected some Marcaine here to  help with postoperative analgesia, and went ahead and approached the axilla.  An axillary incision was made and using  the Neoprobe, I was able to dissect  out two nodes that were hot, one more so than the other.  Neither were blue,  although I did see a blue lymphatic coming and what appeared to be draining  towards the first of these two nodes, but the blue dye had not yet entered  the node.  These were both excised and no other hot areas were noted.  In  fact, the counts on these were around 200, with a background once they were  removed of 0-5 in the axilla.  No additional blue nodes were found.  No  additional lymphatics were found.  No palpably abnormal nodes were noted.  Again I injected some Marcaine here to help with postoperative analgesia.  I  had used some clips for blood vessel and lymphatic control during this  dissection.  This incision was closed with #3-0 Vicryl, followed by #4-0  Monocryl subcuticular.  Attention was returned to the lumpectomy site.  Radiology did report that  the specimen was within the specimen mammogram.  I put four little clips  around the margins and two median clips to mark deep in the superficial  margins, and then closed the subcutaneous with some #3-0 Vicryl, and the  skin with #4-0 Monocryl subcuticular.  The patient tolerated the procedure well.  There were no  operative  complications.  All counts were correct.                                                Currie Paris, M.D.    CJS/MEDQ  D:  05/07/2002  T:  05/07/2002  Job:  [id number]   cc:   Lacretia Leigh. Quintella Reichert, M.D.  [place] [address] [place]  [place] [address]  Fax: [phone]   [facility]

## 2010-08-17 ENCOUNTER — Other Ambulatory Visit: Payer: Self-pay | Admitting: Internal Medicine

## 2010-09-03 ENCOUNTER — Other Ambulatory Visit (INDEPENDENT_AMBULATORY_CARE_PROVIDER_SITE_OTHER): Payer: Medicare Other

## 2010-09-03 DIAGNOSIS — E119 Type 2 diabetes mellitus without complications: Secondary | ICD-10-CM

## 2010-09-03 NOTE — Progress Notes (Signed)
Labs only

## 2010-09-17 ENCOUNTER — Other Ambulatory Visit: Payer: Self-pay | Admitting: Oncology

## 2010-09-17 ENCOUNTER — Encounter (HOSPITAL_BASED_OUTPATIENT_CLINIC_OR_DEPARTMENT_OTHER): Payer: Medicare Other | Admitting: Oncology

## 2010-09-17 DIAGNOSIS — I10 Essential (primary) hypertension: Secondary | ICD-10-CM

## 2010-09-17 DIAGNOSIS — E7800 Pure hypercholesterolemia, unspecified: Secondary | ICD-10-CM

## 2010-09-17 DIAGNOSIS — C50219 Malignant neoplasm of upper-inner quadrant of unspecified female breast: Secondary | ICD-10-CM

## 2010-09-17 LAB — CBC WITH DIFFERENTIAL/PLATELET
BASO%: 0.4 % (ref 0.0–2.0)
Basophils Absolute: 0 10*3/uL (ref 0.0–0.1)
EOS%: 3.2 % (ref 0.0–7.0)
Eosinophils Absolute: 0.2 10*3/uL (ref 0.0–0.5)
HCT: 38.2 % (ref 34.8–46.6)
HGB: 12.8 g/dL (ref 11.6–15.9)
LYMPH%: 23.2 % (ref 14.0–49.7)
MCH: 29.2 pg (ref 25.1–34.0)
MCHC: 33.6 g/dL (ref 31.5–36.0)
MCV: 87.1 fL (ref 79.5–101.0)
MONO#: 0.5 10*3/uL (ref 0.1–0.9)
MONO%: 7.9 % (ref 0.0–14.0)
NEUT#: 4.3 10*3/uL (ref 1.5–6.5)
NEUT%: 65.3 % (ref 38.4–76.8)
Platelets: 224 10*3/uL (ref 145–400)
RBC: 4.38 10*6/uL (ref 3.70–5.45)
RDW: 14.7 % — ABNORMAL HIGH (ref 11.2–14.5)
WBC: 6.6 10*3/uL (ref 3.9–10.3)
lymph#: 1.5 10*3/uL (ref 0.9–3.3)

## 2010-09-18 LAB — COMPREHENSIVE METABOLIC PANEL
ALT: 15 U/L (ref 0–35)
Albumin: 4.2 g/dL (ref 3.5–5.2)
CO2: 30 mEq/L (ref 19–32)
Calcium: 9.5 mg/dL (ref 8.4–10.5)
Chloride: 103 mEq/L (ref 96–112)
Glucose, Bld: 117 mg/dL — ABNORMAL HIGH (ref 70–99)
Potassium: 4.2 mEq/L (ref 3.5–5.3)
Sodium: 141 mEq/L (ref 135–145)
Total Bilirubin: 0.4 mg/dL (ref 0.3–1.2)
Total Protein: 6.6 g/dL (ref 6.0–8.3)

## 2010-09-18 LAB — CANCER ANTIGEN 27.29: CA 27.29: 14 U/mL (ref 0–39)

## 2010-09-18 LAB — LACTATE DEHYDROGENASE: LDH: 135 U/L (ref 94–250)

## 2010-09-21 ENCOUNTER — Encounter (HOSPITAL_BASED_OUTPATIENT_CLINIC_OR_DEPARTMENT_OTHER): Payer: Medicare Other | Admitting: Oncology

## 2010-09-21 DIAGNOSIS — C50219 Malignant neoplasm of upper-inner quadrant of unspecified female breast: Secondary | ICD-10-CM

## 2010-10-26 ENCOUNTER — Other Ambulatory Visit: Payer: Self-pay | Admitting: Internal Medicine

## 2010-10-29 ENCOUNTER — Other Ambulatory Visit: Payer: Self-pay | Admitting: Internal Medicine

## 2010-10-29 MED ORDER — FOLIC ACID 1 MG PO TABS: 1.0000 mg | ORAL_TABLET | Freq: Every day | ORAL | Status: DC

## 2010-10-29 NOTE — Telephone Encounter (Signed)
RX sent to pharmacy  

## 2010-11-16 ENCOUNTER — Other Ambulatory Visit: Payer: Self-pay | Admitting: Internal Medicine

## 2010-11-16 NOTE — Telephone Encounter (Signed)
Patient needs rx for losartan 50 mg - 90 day  Supply - patient will pick up Tuesday afternoon [id number]

## 2010-11-17 MED ORDER — LOSARTAN POTASSIUM 50 MG PO TABS: 50.0000 mg | ORAL_TABLET | Freq: Every day | ORAL | Status: DC

## 2010-11-17 NOTE — Telephone Encounter (Signed)
Patient aware rx is at the front desk ready for pick-up

## 2010-11-28 ENCOUNTER — Encounter (INDEPENDENT_AMBULATORY_CARE_PROVIDER_SITE_OTHER): Payer: Self-pay | Admitting: Surgery

## 2011-01-13 ENCOUNTER — Other Ambulatory Visit: Payer: Self-pay | Admitting: Internal Medicine

## 2011-01-13 DIAGNOSIS — D7289 Other specified disorders of white blood cells: Secondary | ICD-10-CM

## 2011-01-14 ENCOUNTER — Other Ambulatory Visit (INDEPENDENT_AMBULATORY_CARE_PROVIDER_SITE_OTHER): Payer: Medicare Other

## 2011-01-14 DIAGNOSIS — D7289 Other specified disorders of white blood cells: Secondary | ICD-10-CM

## 2011-01-14 DIAGNOSIS — E119 Type 2 diabetes mellitus without complications: Secondary | ICD-10-CM

## 2011-01-14 NOTE — Progress Notes (Signed)
12  

## 2011-03-17 ENCOUNTER — Encounter: Payer: Self-pay | Admitting: Internal Medicine

## 2011-03-17 ENCOUNTER — Ambulatory Visit (INDEPENDENT_AMBULATORY_CARE_PROVIDER_SITE_OTHER): Payer: Medicare Other | Admitting: Internal Medicine

## 2011-03-17 VITALS — BP 116/70 | HR 98 | Temp 97.6°F | Wt 145.2 lb

## 2011-03-17 DIAGNOSIS — J019 Acute sinusitis, unspecified: Secondary | ICD-10-CM

## 2011-03-17 DIAGNOSIS — R05 Cough: Secondary | ICD-10-CM

## 2011-03-17 DIAGNOSIS — R059 Cough, unspecified: Secondary | ICD-10-CM

## 2011-03-17 DIAGNOSIS — J029 Acute pharyngitis, unspecified: Secondary | ICD-10-CM

## 2011-03-17 MED ORDER — BENZONATATE 100 MG PO CAPS: 100.0000 mg | ORAL_CAPSULE | Freq: Two times a day (BID) | ORAL | Status: AC | PRN

## 2011-03-17 MED ORDER — AMOXICILLIN 500 MG PO CAPS: 500.0000 mg | ORAL_CAPSULE | Freq: Three times a day (TID) | ORAL | Status: AC

## 2011-03-17 NOTE — Progress Notes (Signed)
  Subjective:    Patient ID: Tiffany Hood, female    DOB: Aug 15, 1940, 70 y.o.   MRN: [id number]  HPI Respiratory tract infection Onset/symptoms:12/14 as itchy , sore throat Exposures (illness/environmental/extrinsic):no Progression of symptoms:to head congestion Treatments/response:Mucinex DM helped;Zicam, Coricidin, Robitussin, & Nyquil didn't help Present symptoms: Fever/chills/sweats:no Frontal headache:no Facial pain:no Nasal purulence:yes, brown/ gray; minor epistaxis  Dental pain:no Lymphadenopathy:yes Wheezing/shortness of breath:no Cough/sputum/hemoptysis:cough with scant yellow sputum Associated extrinsic/allergic symptoms:itchy eyes/ sneezing:no Smoking history:never                                                                 Review of Systems     Objective:   Physical Exam  General appearance is of good health and nourishment; no acute distress or increased work of breathing is present.  No  lymphadenopathy about the head, neck, or axilla noted.   Eyes: No conjunctival inflammation or lid edema is present. .  Ears:  External ear exam shows no significant lesions or deformities.  Otoscopic examination reveals clear canals, tympanic membranes are intact bilaterally without bulging, retraction, inflammation or discharge. TMs dull  Nose:  External nasal examination shows no deformity or inflammation. Nasal mucosa are pink and moist without lesions or exudates. No septal dislocation .No obstruction to airflow.   Oral exam: Dental hygiene is good; lips and gums are healthy appearing.There is no oropharyngeal erythema or exudate noted.L oropharynx lower than R Neck:  No deformities, thyromegaly, masses, or tenderness noted.   Supple with full range of motion without pain.   Heart:  Normal rate and regular rhythm. S1 and S2 normal without gallop, murmur, click, rub or other extra sounds.   Lungs:Chest clear to auscultation; no wheezes, rhonchi,rales ,or rubs  present.No increased work of breathing.    Extremities:  No cyanosis, edema, or clubbing  noted . OA finger changes   Skin: Warm & dry           Assessment & Plan:   #1 rhinosinusitis  #2 cough with scant sputum, possibly from postnasal drainage; rule out concomitant bronchitis.  Plan see orders recommendations.

## 2011-03-17 NOTE — Patient Instructions (Signed)
Plain Mucinex for thick secretions ;force NON dairy fluids. Use a Neti pot daily as needed for sinus congestion  

## 2011-03-26 ENCOUNTER — Other Ambulatory Visit: Payer: Self-pay | Admitting: Oncology

## 2011-03-26 ENCOUNTER — Telehealth: Payer: Self-pay | Admitting: Oncology

## 2011-03-26 DIAGNOSIS — Z780 Asymptomatic menopausal state: Secondary | ICD-10-CM

## 2011-03-26 DIAGNOSIS — Z1231 Encounter for screening mammogram for malignant neoplasm of breast: Secondary | ICD-10-CM

## 2011-03-26 NOTE — Telephone Encounter (Signed)
S/w pt today re appts for 1/18 bone density and mammo and 6/21 lb and 6/25 PR (mosaiq - 09/21/10 pof).

## 2011-04-05 ENCOUNTER — Other Ambulatory Visit: Payer: Self-pay | Admitting: Internal Medicine

## 2011-04-16 ENCOUNTER — Ambulatory Visit: Payer: Medicare Other

## 2011-04-16 ENCOUNTER — Other Ambulatory Visit: Payer: Medicare Other

## 2011-04-26 ENCOUNTER — Other Ambulatory Visit: Payer: Self-pay | Admitting: Internal Medicine

## 2011-04-27 NOTE — Telephone Encounter (Signed)
Lipid/Hep 272.4/995.20  

## 2011-04-30 ENCOUNTER — Ambulatory Visit
Admission: RE | Admit: 2011-04-30 | Discharge: 2011-04-30 | Disposition: A | Discharge status: Home / Self Care | Payer: Medicare Other | Source: Ambulatory Visit | Attending: Oncology | Admitting: Oncology

## 2011-04-30 DIAGNOSIS — Z780 Asymptomatic menopausal state: Secondary | ICD-10-CM

## 2011-04-30 DIAGNOSIS — Z1231 Encounter for screening mammogram for malignant neoplasm of breast: Secondary | ICD-10-CM

## 2011-05-27 ENCOUNTER — Other Ambulatory Visit: Payer: Self-pay | Admitting: Internal Medicine

## 2011-05-28 NOTE — Telephone Encounter (Signed)
Patient has appt scheduled in April.  Prescription refilled until then.

## 2011-07-14 ENCOUNTER — Other Ambulatory Visit: Payer: Self-pay | Admitting: Internal Medicine

## 2011-07-16 ENCOUNTER — Other Ambulatory Visit (INDEPENDENT_AMBULATORY_CARE_PROVIDER_SITE_OTHER): Payer: Medicare Other

## 2011-07-16 DIAGNOSIS — E119 Type 2 diabetes mellitus without complications: Secondary | ICD-10-CM

## 2011-07-16 LAB — HEMOGLOBIN A1C: Hgb A1c MFr Bld: 6.4 % (ref 4.6–6.5)

## 2011-07-16 NOTE — Progress Notes (Signed)
LABS ONLY  

## 2011-08-24 ENCOUNTER — Other Ambulatory Visit (INDEPENDENT_AMBULATORY_CARE_PROVIDER_SITE_OTHER): Payer: Medicare Other

## 2011-08-24 ENCOUNTER — Telehealth: Payer: Self-pay | Admitting: Internal Medicine

## 2011-08-24 DIAGNOSIS — T887XXA Unspecified adverse effect of drug or medicament, initial encounter: Secondary | ICD-10-CM

## 2011-08-24 DIAGNOSIS — E785 Hyperlipidemia, unspecified: Secondary | ICD-10-CM

## 2011-08-24 LAB — LIPID PANEL
HDL: 61.5 mg/dL (ref >39.00)
Total CHOL/HDL Ratio: 3
VLDL: 40.4 mg/dL — ABNORMAL HIGH (ref 0.0–40.0)

## 2011-08-24 LAB — HEPATIC FUNCTION PANEL
AST: 18 U/L (ref 0–37)
Albumin: 3.8 g/dL (ref 3.5–5.2)
Alkaline Phosphatase: 77 U/L (ref 39–117)
Bilirubin, Direct: 0 mg/dL (ref 0.0–0.3)
Total Bilirubin: 0.4 mg/dL (ref 0.3–1.2)

## 2011-08-24 LAB — LDL CHOLESTEROL, DIRECT: Direct LDL: 111.6 mg/dL

## 2011-08-24 NOTE — Progress Notes (Signed)
Labs only

## 2011-08-24 NOTE — Telephone Encounter (Signed)
Pt came into the office today for labs and states she would like a 90 day supply of pravastatin sodium 40mg . Last fill 07-15-11. She uses [facility] on [address].

## 2011-08-24 NOTE — Telephone Encounter (Signed)
Patient had labs done today, rx request will be held until labs return and reviewed

## 2011-08-25 MED ORDER — PRAVASTATIN SODIUM 40 MG PO TABS: ORAL_TABLET | ORAL | Status: DC

## 2011-08-25 NOTE — Telephone Encounter (Signed)
RX sent in, Per Dr.Hopper patient needs to schedule a CPX with in the next 3 months   Left message on voicemail for patient to return call when available

## 2011-08-25 NOTE — Telephone Encounter (Signed)
OK # 90 

## 2011-08-25 NOTE — Telephone Encounter (Signed)
Dr.Hopper please advise on refill request. Patient had labs yesterday and LDL is 111 (increased from 98, 1 year ago)

## 2011-08-26 NOTE — Telephone Encounter (Signed)
Patient with pending appointment 10/19/11

## 2011-09-16 ENCOUNTER — Ambulatory Visit (HOSPITAL_BASED_OUTPATIENT_CLINIC_OR_DEPARTMENT_OTHER): Payer: Medicare Other

## 2011-09-16 DIAGNOSIS — C50219 Malignant neoplasm of upper-inner quadrant of unspecified female breast: Secondary | ICD-10-CM

## 2011-09-16 LAB — CBC WITH DIFFERENTIAL/PLATELET
BASO%: 0.5 % (ref 0.0–2.0)
Eosinophils Absolute: 0.2 10*3/uL (ref 0.0–0.5)
MCHC: 33.2 g/dL (ref 31.5–36.0)
MONO#: 0.7 10*3/uL (ref 0.1–0.9)
NEUT#: 4.3 10*3/uL (ref 1.5–6.5)
RBC: 4.39 10*6/uL (ref 3.70–5.45)
RDW: 14.6 % — ABNORMAL HIGH (ref 11.2–14.5)
WBC: 7 10*3/uL (ref 3.9–10.3)

## 2011-09-17 ENCOUNTER — Other Ambulatory Visit: Payer: Medicare Other | Admitting: Lab

## 2011-09-17 LAB — COMPREHENSIVE METABOLIC PANEL
ALT: 16 U/L (ref 0–35)
Albumin: 4.1 g/dL (ref 3.5–5.2)
Alkaline Phosphatase: 71 U/L (ref 39–117)
CO2: 28 mEq/L (ref 19–32)
Glucose, Bld: 108 mg/dL — ABNORMAL HIGH (ref 70–99)
Potassium: 4.4 mEq/L (ref 3.5–5.3)
Sodium: 138 mEq/L (ref 135–145)
Total Bilirubin: 0.4 mg/dL (ref 0.3–1.2)
Total Protein: 6.6 g/dL (ref 6.0–8.3)

## 2011-09-17 LAB — VITAMIN D 25 HYDROXY (VIT D DEFICIENCY, FRACTURES): Vit D, 25-Hydroxy: 57 ng/mL (ref 30–89)

## 2011-09-21 ENCOUNTER — Ambulatory Visit (HOSPITAL_BASED_OUTPATIENT_CLINIC_OR_DEPARTMENT_OTHER): Payer: Medicare Other | Admitting: Oncology

## 2011-09-21 VITALS — BP 138/83 | HR 87 | Temp 97.6°F | Ht 63.0 in | Wt 150.8 lb

## 2011-09-21 DIAGNOSIS — Z853 Personal history of malignant neoplasm of breast: Secondary | ICD-10-CM

## 2011-09-21 DIAGNOSIS — E559 Vitamin D deficiency, unspecified: Secondary | ICD-10-CM

## 2011-09-21 DIAGNOSIS — M810 Age-related osteoporosis without current pathological fracture: Secondary | ICD-10-CM

## 2011-09-21 DIAGNOSIS — Z170 Estrogen receptor positive status [ER+]: Secondary | ICD-10-CM

## 2011-09-21 DIAGNOSIS — C50219 Malignant neoplasm of upper-inner quadrant of unspecified female breast: Secondary | ICD-10-CM

## 2011-09-21 MED ORDER — ALENDRONATE SODIUM 70 MG PO TABS: 70.0000 mg | ORAL_TABLET | ORAL | Status: DC

## 2011-09-21 NOTE — Progress Notes (Signed)
Hematology and Oncology Follow Up Visit  Tiffany Hood [id number] 01/11/1941 71 y.o. 09/21/2011 12:05 PM   DIAGNOSIS:   Breast cancer  PAST THERAPY:  1. T2 N0 breast cancer, status post lumpectomy 05/07/2002.  Status post radiation therapy completed 08/20/02.  On Arimidex since June 2004. 2. Hypercholesterolemia. 3. Hypertension.   Interim History:  Patient returns for followup she is doing well. She has no complaints she is on the same medication as before. She has no side effects from taking Arimidex.  Medications: I have reviewed the patient's current medications.  Allergies: No Known Allergies  Past Medical History, Surgical history, Social history, and Family History were reviewed and updated.  Review of Systems: Constitutional:  Negative for fever, chills, night sweats, anorexia, weight loss, pain. Cardiovascular: no chest pain or dyspnea on exertion Respiratory: negative Neurological: negative Dermatological: negative ENT: negative Skin Gastrointestinal: negative Genito-Urinary: negative Hematological and Lymphatic: negative Breast: negative Musculoskeletal: negative Remaining ROS negative.  Physical Exam:  Blood pressure 138/83, pulse 87, temperature 97.6 F (36.4 C), height 5\' 3"  (1.6 m), weight 150 lb 12.8 oz (68.402 kg).  ECOG: 0HEENT:  Sclerae anicteric, conjunctivae pink.  Oropharynx clear.  No mucositis or candidiasis.  Nodes:  No cervical, supraclavicular, or axillary lymphadenopathy palpated.  Breast Exam:  Right breast is benign.  No masses, discharge, skin change, or nipple inversion.  Left breast is benign.  No masses, discharge, skin change, or nipple inversion..  Lungs:  Clear to auscultation bilaterally.  No crackles, rhonchi, or wheezes.  Heart:  Regular rate and rhythm.  Abdomen:  Soft, nontender.  Positive bowel sounds.  No organomegaly or masses palpated.  Musculoskeletal:  No focal spinal tenderness to palpation.  Extremities:  Benign.  No  peripheral edema or cyanosis.  Skin:  Benign.  Neuro:  Nonfocal.       Lab Results: Lab Results  Component Value Date   WBC 7.0 09/16/2011   HGB 12.6 09/16/2011   HCT 37.8 09/16/2011   MCV 86.2 09/16/2011   PLT 239 09/16/2011     Chemistry      Component Value Date/Time   NA 138 09/16/2011 1107   K 4.4 09/16/2011 1107   CL 102 09/16/2011 1107   CO2 28 09/16/2011 1107   BUN 26* 09/16/2011 1107   CREATININE 0.69 09/16/2011 1107      Component Value Date/Time   CALCIUM 9.6 09/16/2011 1107   ALKPHOS 71 09/16/2011 1107   AST 16 09/16/2011 1107   ALT 16 09/16/2011 1107   BILITOT 0.4 09/16/2011 1107       Radiological Studies:  No results found.   IMPRESSIONS AND PLAN: A 71 y.o. 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 Reviewed results of her bone density test which show osteoporosis in her forearm and borderline osteopenia in her hip. I discussed these results and indicated that it would likely be time for her to discontinue the Arimidex and began Fosamax to help with her bones. Her vitamin D level is excellent. Her most recent mammogram was normal. She will discontinue Arimidex begin Fosamax and I will see her in a years time. I did review her labs with her as well.  Spent more than half the time coordinating care, as well as discussion of BMI and its implications.      Redmond Whittley 6/25/201312:05 PM Cell [phone]

## 2011-09-22 ENCOUNTER — Telehealth: Payer: Self-pay | Admitting: *Deleted

## 2011-09-22 NOTE — Telephone Encounter (Signed)
Gave patient appointment for 2014 made patient appointment for mammogram over at the breast center

## 2011-10-19 ENCOUNTER — Encounter: Payer: Self-pay | Admitting: Internal Medicine

## 2011-10-19 ENCOUNTER — Ambulatory Visit (INDEPENDENT_AMBULATORY_CARE_PROVIDER_SITE_OTHER): Payer: Medicare Other | Admitting: Internal Medicine

## 2011-10-19 VITALS — BP 118/64 | HR 66 | Temp 98.3°F | Resp 12 | Ht 62.08 in | Wt 151.8 lb

## 2011-10-19 DIAGNOSIS — I10 Essential (primary) hypertension: Secondary | ICD-10-CM

## 2011-10-19 DIAGNOSIS — K5790 Diverticulosis of intestine, part unspecified, without perforation or abscess without bleeding: Secondary | ICD-10-CM | POA: Insufficient documentation

## 2011-10-19 DIAGNOSIS — Z0000 Encounter for general adult medical examination without abnormal findings: Secondary | ICD-10-CM

## 2011-10-19 DIAGNOSIS — E119 Type 2 diabetes mellitus without complications: Secondary | ICD-10-CM

## 2011-10-19 DIAGNOSIS — E785 Hyperlipidemia, unspecified: Secondary | ICD-10-CM

## 2011-10-19 DIAGNOSIS — K5730 Diverticulosis of large intestine without perforation or abscess without bleeding: Secondary | ICD-10-CM

## 2011-10-19 NOTE — Assessment & Plan Note (Signed)
Blood pressure appears to be well controlled; blood pressure goals discussed

## 2011-10-19 NOTE — Progress Notes (Signed)
Subjective:    Patient ID: Tiffany Hood, female    DOB: 1940/08/26, 71 y.o.   MRN: [id number]  HPI Medicare Wellness Visit:  The following psychosocial & medical history were reviewed as required by Medicare.   Social history: caffeine: none , alcohol:  none ,  tobacco use : never  & exercise :3X/ week @ [employer].   Home & personal  safety / fall risk: no issues activities of daily living: no limitations , seatbelt use : yes , and smoke alarm employment : yes .  Power of Attorney/Living Will status : NO  Vision ( as recorded per Nurse) & Hearing  evaluation : Ophth exam last week, negative. No hearing evaluation. Orientation :oriented x 3  , memory & recall :good,  math testing: good,and mood & affect : normal . Depression / anxiety: denied Travel history : 2008 [place]  , immunization status :Shingles needed, transfusion history:no , and preventive health surveillance ( colonoscopies, BMD , etc as per protocol/ [facility]): colonoscopy 2004, Dental care:  Seen this year . Chart reviewed &  Updated. Active issues reviewed & addressed.       Review of Systems HYPERTENSION: Disease Monitoring: Blood pressure range-not monitored @  home  Chest pain, palpitations-no      Dyspnea- no Medications: Compliance- yes  Lightheadedness,Syncope-no    Edema- no  DIABETES: Disease Monitoring: Blood Sugar ranges-103-110 Polyuria/phagia/dipsia- only with HCTZ; nocturia X 1      Visual problems- no Medications: Compliance- no meds; low carb diet Hypoglycemic symptoms- no  HYPERLIPIDEMIA: Disease Monitoring: See symptoms for Hypertension Medications: Compliance- yes  Abd pain, bowel changes- no   Muscle aches- no       Objective:   Physical Exam Gen.:  well-nourished in appearance. Alert, appropriate and cooperative throughout exam. Head: Normocephalic without obvious abnormalities Eyes: No corneal or conjunctival inflammation noted.  Extraocular motion intact. Vision grossly normal. Ears:  External  ear exam reveals no significant lesions or deformities. Canals clear .TMs normal. Hearing is grossly normal bilaterally. Nose: External nasal exam reveals no deformity or inflammation. Nasal mucosa are pink and moist. No lesions or exudates noted.  Mouth: Oral mucosa and oropharynx reveal no lesions or exudates. Teeth in good repair. Neck: No deformities, masses, or tenderness noted. Range of motion & Thyroid normal. Lungs: Normal respiratory effort; chest expands symmetrically. Lungs are clear to auscultation without rales, wheezes, or increased work of breathing. Heart: Normal rate and rhythm. Normal S1 and S2. No gallop, click, or rub. Grade 1/2 over 6 systolic murmur  Abdomen: Bowel sounds normal; abdomen soft and nontender. No masses, organomegaly or hernias noted. Genitalia: Dr Seymour Bars                                             Musculoskeletal/extremities: No deformity or scoliosis noted of  the thoracic or lumbar spine. No clubbing, cyanosis, edema, or deformity noted. Range of motion  normal .Tone & strength  normal.Joints : classic OA finger changes. Nail health  good. Vascular: Carotid, radial artery, dorsalis pedis and  posterior tibial pulses are full and equal. No bruits present. Neurologic: Alert and oriented x3. Deep tendon reflexes symmetrical and normal.          Skin: Intact without suspicious lesions or rashes. Lymph: No cervical, axillary lymphadenopathy present. Psych: Mood and affect are normal. Normally interactive  Assessment & Plan:  #1 Medicare Wellness Exam; criteria met ; data entered #2 Problem List reviewed ; Assessment/ Recommendations made Plan: see Orders

## 2011-10-19 NOTE — Patient Instructions (Addendum)
Preventive Health Care: Exercise  30-45  minutes a day, 3-4 days a week. Walking is especially valuable in preventing Osteoporosis. Health Care Power of Attorney & Living Will place you in charge of your health care  decisions. Verify these are  in place. The most common cause of elevated triglycerides is the ingestion of sugar from high fructose corn syrup sources added to processed foods & drinks.  Eat a low-fat diet with lots of fruits and vegetables, up to 7-9 servings per day. Consume less than 30 (preferably ZERO) grams of sugar per day from foods & drinks with High Fructose Corn Syrup (HFCS) sugar as #1,2,3 or # 4 on label.Whole Foods, [employer] & [employer] do not carry products with HFCS. Follow a  low carb nutrition program such as [place] or [employer]  to prevent Diabetes progression . White carbohydrates (potatoes, rice, bread, and pasta) have a high spike of sugar and a high load of sugar. For example a  baked potato has a cup of sugar and a  french fry  2 teaspoons of sugar. Yams, wild  rice, whole grained bread &  wheat pasta have been much lower spike and load of  sugar. Portions should be the size of a deck of cards or your palm.  To prevent palpitations or premature beats, avoid stimulants such as decongestants, diet pills, nicotine, or caffeine (coffee, tea, cola, or chocolate) to excess.  Please  schedule fasting Labs in October: Lipids,urine microalbumin, A1c. PLEASE BRING THESE INSTRUCTIONS TO FOLLOW UP  LAB APPOINTMENT.This will guarantee correct labs are drawn, eliminating need for repeat blood sampling ( needle sticks ! ). Diagnoses /Codes:250.00, 272.4 Please try to go on My Chart within the next 24 hours to allow me to release the results directly to you.

## 2011-10-19 NOTE — Assessment & Plan Note (Signed)
Fasting blood sugars indicate good control. A1c was 6.4% on 07/16/11. A1c should be checked every 6 months

## 2011-10-19 NOTE — Assessment & Plan Note (Signed)
Major dietary component remains; triglycerides were 202 on 08/24/11. LDL has improved dramatically to 111.6. HDL is protective at 61.5. Nutritional interventions stressed

## 2011-11-15 ENCOUNTER — Other Ambulatory Visit: Payer: Self-pay | Admitting: Internal Medicine

## 2011-12-07 ENCOUNTER — Telehealth: Payer: Self-pay | Admitting: Internal Medicine

## 2011-12-07 NOTE — Telephone Encounter (Signed)
lmovm

## 2011-12-07 NOTE — Telephone Encounter (Signed)
Absolutely, as long as they have checked and verified covered

## 2011-12-07 NOTE — Telephone Encounter (Signed)
Patient would like to come in and get a Shingles shot. She has already spoken with her insurance company about cost. Is it ok to schedule her?

## 2011-12-08 NOTE — Telephone Encounter (Signed)
Pt scheduled  

## 2011-12-09 ENCOUNTER — Ambulatory Visit (INDEPENDENT_AMBULATORY_CARE_PROVIDER_SITE_OTHER): Payer: Medicare Other | Admitting: Internal Medicine

## 2011-12-09 DIAGNOSIS — Z23 Encounter for immunization: Secondary | ICD-10-CM

## 2011-12-09 DIAGNOSIS — Z2911 Encounter for prophylactic immunotherapy for respiratory syncytial virus (RSV): Secondary | ICD-10-CM

## 2011-12-10 NOTE — Progress Notes (Signed)
  Subjective:    Patient ID: Tiffany Hood, female    DOB: Jan 30, 1941, 71 y.o.   MRN: [id number]  HPI here for Shingles shot only    Review of Systems     Objective:   Physical Exam        Assessment & Plan:

## 2011-12-27 ENCOUNTER — Other Ambulatory Visit: Payer: Self-pay | Admitting: Internal Medicine

## 2012-01-12 ENCOUNTER — Other Ambulatory Visit: Payer: Self-pay | Admitting: Internal Medicine

## 2012-01-18 ENCOUNTER — Other Ambulatory Visit: Payer: Self-pay | Admitting: Internal Medicine

## 2012-01-18 DIAGNOSIS — T887XXA Unspecified adverse effect of drug or medicament, initial encounter: Secondary | ICD-10-CM

## 2012-01-18 DIAGNOSIS — E119 Type 2 diabetes mellitus without complications: Secondary | ICD-10-CM

## 2012-01-18 DIAGNOSIS — E785 Hyperlipidemia, unspecified: Secondary | ICD-10-CM

## 2012-01-19 ENCOUNTER — Other Ambulatory Visit (INDEPENDENT_AMBULATORY_CARE_PROVIDER_SITE_OTHER): Payer: Medicare Other | Admitting: Family Medicine

## 2012-01-19 DIAGNOSIS — T887XXA Unspecified adverse effect of drug or medicament, initial encounter: Secondary | ICD-10-CM

## 2012-01-19 DIAGNOSIS — E119 Type 2 diabetes mellitus without complications: Secondary | ICD-10-CM

## 2012-01-19 DIAGNOSIS — E785 Hyperlipidemia, unspecified: Secondary | ICD-10-CM

## 2012-01-19 DIAGNOSIS — Z853 Personal history of malignant neoplasm of breast: Secondary | ICD-10-CM

## 2012-01-19 DIAGNOSIS — E559 Vitamin D deficiency, unspecified: Secondary | ICD-10-CM

## 2012-01-19 LAB — COMPREHENSIVE METABOLIC PANEL
Albumin: 3.4 g/dL — ABNORMAL LOW (ref 3.5–5.2)
BUN: 27 mg/dL — ABNORMAL HIGH (ref 6–23)
Calcium: 9 mg/dL (ref 8.4–10.5)
Chloride: 103 mEq/L (ref 96–112)
Glucose, Bld: 76 mg/dL (ref 70–99)
Potassium: 4.3 mEq/L (ref 3.5–5.1)

## 2012-01-19 LAB — CBC WITH DIFFERENTIAL/PLATELET
Basophils Absolute: 0 10*3/uL (ref 0.0–0.1)
Eosinophils Absolute: 0.4 10*3/uL (ref 0.0–0.7)
Hemoglobin: 12.2 g/dL (ref 12.0–15.0)
Lymphocytes Relative: 24.7 % (ref 12.0–46.0)
MCHC: 32.4 g/dL (ref 30.0–36.0)
MCV: 87.2 fl (ref 78.0–100.0)
Monocytes Absolute: 0.5 10*3/uL (ref 0.1–1.0)
Neutro Abs: 3.8 10*3/uL (ref 1.4–7.7)
Neutrophils Relative %: 60.2 % (ref 43.0–77.0)
RDW: 15.4 % — ABNORMAL HIGH (ref 11.5–14.6)

## 2012-01-19 LAB — LIPID PANEL
LDL Cholesterol: 84 mg/dL (ref 0–99)
Total CHOL/HDL Ratio: 3
VLDL: 37 mg/dL (ref 0.0–40.0)

## 2012-01-19 NOTE — Addendum Note (Signed)
Addended by: Mauri Reading on: 01/19/2012 12:36 PM   Modules accepted: Orders

## 2012-01-20 LAB — MICROALBUMIN / CREATININE URINE RATIO
Creatinine,U: 116.8 mg/dL
Microalb, Ur: 1.6 mg/dL (ref 0.0–1.9)

## 2012-01-20 NOTE — Addendum Note (Signed)
Addended by: Silvio Pate D on: 01/20/2012 10:49 AM   Modules accepted: Orders

## 2012-01-21 LAB — MICROALBUMIN / CREATININE URINE RATIO
Creatinine,U: 111 mg/dL
Microalb Creat Ratio: 0.2 mg/g (ref 0.0–30.0)

## 2012-03-29 HISTORY — PX: OTHER SURGICAL HISTORY: SHX169

## 2012-04-06 ENCOUNTER — Encounter: Payer: Self-pay | Admitting: Internal Medicine

## 2012-04-06 ENCOUNTER — Ambulatory Visit (INDEPENDENT_AMBULATORY_CARE_PROVIDER_SITE_OTHER): Payer: Medicare Other | Admitting: Internal Medicine

## 2012-04-06 VITALS — BP 124/78 | HR 85 | Temp 98.0°F | Wt 155.0 lb

## 2012-04-06 DIAGNOSIS — M7130 Other bursal cyst, unspecified site: Secondary | ICD-10-CM

## 2012-04-06 DIAGNOSIS — M1990 Unspecified osteoarthritis, unspecified site: Secondary | ICD-10-CM

## 2012-04-06 DIAGNOSIS — E119 Type 2 diabetes mellitus without complications: Secondary | ICD-10-CM

## 2012-04-06 LAB — MICROALBUMIN / CREATININE URINE RATIO: Creatinine,U: 167.7 mg/dL

## 2012-04-06 NOTE — Assessment & Plan Note (Signed)
A1c was 6.4 in October 2013. Although the glucoses suggest excellent control; A1c will be rechecked because of dietary changes and steroid injections

## 2012-04-06 NOTE — Assessment & Plan Note (Signed)
There appears to be a synovial cyst of the DIP joint of the left index finger ; evaluation by a hand surgeon is recommended

## 2012-04-06 NOTE — Progress Notes (Signed)
  Subjective:    Patient ID: Tiffany Hood, female    DOB: [birth year], 72 y.o.   MRN: [id number]  HPI  She noted a lesion at the DIP joint along the radial surface of the left index finger in November without trigger or injury. It has remained essentially stable as to size and color. It is nontender except when there is blunt trauma.   She is on alendronate from her oncologist, Dr Donnie Coffin, for osteoporosis.  Her mother had degenerative joint disease in her father had rheumatoid arthritis    Review of Systems She's had steroid injections at the base of both thumbs in 2013 by Dr. Ciro Backer, orthopedist.  Despite a change in her diary holidays and a steroid injections; her glucoses have been well controlled with no fasting blood sugars higher than 110.     Objective:   Physical Exam   She appears healthy and well-nourished  She has classic Roxy Manns arthritic changes in hands with osteophyte formation at the DIP areas. There appears to be a synovial cyst of the left index PIP joint which is firm and nontender. Clinically this is not appear to be a ganglion as it is off center along the radial aspect of the DIP joint There is full range of motion of all joints. She has mild crepitus in the knees  She has no lymphadenopathy about the neck, axilla, or epitrochlear areas.  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

## 2012-04-06 NOTE — Patient Instructions (Addendum)
Review and correct the record as indicated. Please share record with all medical staff seen.  

## 2012-04-29 HISTORY — PX: OTHER SURGICAL HISTORY: SHX169

## 2012-05-13 ENCOUNTER — Other Ambulatory Visit: Payer: Self-pay

## 2012-05-23 ENCOUNTER — Ambulatory Visit: Payer: Medicare Other

## 2012-05-30 ENCOUNTER — Ambulatory Visit
Admission: RE | Admit: 2012-05-30 | Discharge: 2012-05-30 | Disposition: A | Discharge status: Home / Self Care | Payer: Medicare Other | Source: Ambulatory Visit | Attending: Oncology | Admitting: Oncology

## 2012-05-30 DIAGNOSIS — Z1231 Encounter for screening mammogram for malignant neoplasm of breast: Secondary | ICD-10-CM

## 2012-05-30 DIAGNOSIS — E559 Vitamin D deficiency, unspecified: Secondary | ICD-10-CM

## 2012-05-30 DIAGNOSIS — Z853 Personal history of malignant neoplasm of breast: Secondary | ICD-10-CM

## 2012-06-17 ENCOUNTER — Encounter: Payer: Self-pay | Admitting: Oncology

## 2012-06-17 ENCOUNTER — Telehealth: Payer: Self-pay | Admitting: Oncology

## 2012-07-24 ENCOUNTER — Other Ambulatory Visit: Payer: Self-pay | Admitting: Internal Medicine

## 2012-08-08 ENCOUNTER — Other Ambulatory Visit: Payer: Self-pay | Admitting: Internal Medicine

## 2012-08-08 NOTE — Telephone Encounter (Signed)
Med filled.  

## 2012-08-17 ENCOUNTER — Telehealth: Payer: Self-pay | Admitting: *Deleted

## 2012-08-17 NOTE — Telephone Encounter (Signed)
Received call back from patient to reschedule her appt.  Confirmed appt. For 10/09/12 at 1030 for labs and 11am with Dr. Welton Flakes.

## 2012-09-15 ENCOUNTER — Telehealth: Payer: Self-pay | Admitting: Oncology

## 2012-09-15 NOTE — Telephone Encounter (Signed)
, °

## 2012-09-19 ENCOUNTER — Other Ambulatory Visit: Payer: Self-pay | Admitting: Obstetrics & Gynecology

## 2012-09-19 DIAGNOSIS — N83209 Unspecified ovarian cyst, unspecified side: Secondary | ICD-10-CM

## 2012-09-20 ENCOUNTER — Ambulatory Visit
Admission: RE | Admit: 2012-09-20 | Discharge: 2012-09-20 | Disposition: A | Discharge status: Home / Self Care | Payer: Medicare Other | Source: Ambulatory Visit | Attending: Obstetrics & Gynecology | Admitting: Obstetrics & Gynecology

## 2012-09-20 DIAGNOSIS — N83209 Unspecified ovarian cyst, unspecified side: Secondary | ICD-10-CM

## 2012-09-20 MED ORDER — IOHEXOL 300 MG/ML  SOLN
100.0000 mL | Freq: Once | INTRAMUSCULAR | Status: AC | PRN
  Administered 2012-09-20: 100 mL via INTRAVENOUS

## 2012-09-21 ENCOUNTER — Other Ambulatory Visit: Payer: Medicare Other | Admitting: Lab

## 2012-09-21 ENCOUNTER — Ambulatory Visit: Payer: Medicare Other | Admitting: Family

## 2012-09-21 ENCOUNTER — Ambulatory Visit: Payer: Medicare Other | Admitting: Oncology

## 2012-09-22 ENCOUNTER — Other Ambulatory Visit: Payer: Self-pay | Admitting: Obstetrics & Gynecology

## 2012-09-22 DIAGNOSIS — R1900 Intra-abdominal and pelvic swelling, mass and lump, unspecified site: Secondary | ICD-10-CM

## 2012-10-03 ENCOUNTER — Ambulatory Visit
Admission: RE | Admit: 2012-10-03 | Discharge: 2012-10-03 | Disposition: A | Discharge status: Home / Self Care | Payer: Medicare Other | Source: Ambulatory Visit | Attending: Obstetrics & Gynecology | Admitting: Obstetrics & Gynecology

## 2012-10-03 DIAGNOSIS — R1900 Intra-abdominal and pelvic swelling, mass and lump, unspecified site: Secondary | ICD-10-CM

## 2012-10-03 MED ORDER — GADOBENATE DIMEGLUMINE 529 MG/ML IV SOLN
14.0000 mL | Freq: Once | INTRAVENOUS | Status: AC | PRN
  Administered 2012-10-03: 14 mL via INTRAVENOUS

## 2012-10-05 ENCOUNTER — Encounter (INDEPENDENT_AMBULATORY_CARE_PROVIDER_SITE_OTHER): Payer: Self-pay | Admitting: General Surgery

## 2012-10-05 ENCOUNTER — Ambulatory Visit (INDEPENDENT_AMBULATORY_CARE_PROVIDER_SITE_OTHER): Payer: Medicare Other | Admitting: General Surgery

## 2012-10-05 VITALS — BP 114/70 | HR 80 | Temp 97.1°F | Resp 16 | Ht 62.5 in | Wt 147.6 lb

## 2012-10-05 DIAGNOSIS — K5732 Diverticulitis of large intestine without perforation or abscess without bleeding: Secondary | ICD-10-CM | POA: Insufficient documentation

## 2012-10-05 MED ORDER — CIPROFLOXACIN HCL 500 MG PO TABS: 500.0000 mg | ORAL_TABLET | Freq: Two times a day (BID) | ORAL | Status: AC

## 2012-10-05 MED ORDER — METRONIDAZOLE 500 MG PO TABS: 500.0000 mg | ORAL_TABLET | Freq: Three times a day (TID) | ORAL | Status: DC

## 2012-10-05 NOTE — Progress Notes (Signed)
Subjective:     Patient ID: Tiffany Hood, female   DOB: Apr 28, 1940, 72 y.o.   MRN: [id number]  HPI We are asked to see the patient in consultation by Dr. Seymour Bars to evaluate her for diverticular abscess. The patient is a 72 year old white female who for started having left lower quadrant pain around June 15. She states she had a fever for 3 days. She went to her GYN doctor to get a scan on her abdomen which questioned whether she could have an ovarian mass. She then underwent MRI study which further suggested that this area was actually a diverticular abscess of her sigmoid colon. She was never placed on antibiotics. She has had some intermittent nausea. She has not noticed any blood in her stool. Her last colonoscopy was approximately 10 years ago when she was told she had diverticulosis. Her CT scan also showed a 4 mm nodule in her lung that was difficult to characterize.  Review of Systems  Constitutional: Positive for fever and fatigue.  HENT: Negative.   Eyes: Negative.   Respiratory: Negative.   Cardiovascular: Negative.   Gastrointestinal: Positive for abdominal pain and abdominal distention. Negative for blood in stool.  Endocrine: Negative.   Genitourinary: Negative.   Musculoskeletal: Negative.   Skin: Negative.   Allergic/Immunologic: Negative.   Neurological: Negative.   Hematological: Negative.   Psychiatric/Behavioral: Negative.        Objective:   Physical Exam  Constitutional: She is oriented to person, place, and time. She appears well-developed and well-nourished.  HENT:  Head: Normocephalic and atraumatic.  Eyes: Conjunctivae and EOM are normal. Pupils are equal, round, and reactive to light.  Neck: Normal range of motion. Neck supple.  Cardiovascular: Normal rate, regular rhythm and normal heart sounds.   Pulmonary/Chest: Effort normal and breath sounds normal.  Abdominal: Soft. Bowel sounds are normal.  There is mild tenderness in her left lower quadrant. There  is no guarding or peritonitis. There is no palpable mass.  Musculoskeletal: Normal range of motion.  Neurological: She is alert and oriented to person, place, and time.  Skin: Skin is warm and dry.  Psychiatric: She has a normal mood and affect. Her behavior is normal.       Assessment:     The patient appears to have a diverticular abscess of her sigmoid colon.     Plan:     Since she is tolerating this well, at this point I would treat her with oral Cipro and Flagyl and plan to have her return for followup in approximately 2 weeks to check her progress. If she improves then she will probably need a colonoscopy to further evaluate her colon. She has seen [facility] in the past.

## 2012-10-05 NOTE — Patient Instructions (Signed)
Take cipro and flagyl for 2 weeks

## 2012-10-09 ENCOUNTER — Other Ambulatory Visit: Payer: Medicare Other | Admitting: Lab

## 2012-10-09 ENCOUNTER — Ambulatory Visit: Payer: Medicare Other | Admitting: Oncology

## 2012-10-16 ENCOUNTER — Ambulatory Visit (INDEPENDENT_AMBULATORY_CARE_PROVIDER_SITE_OTHER): Payer: Medicare Other | Admitting: General Surgery

## 2012-10-16 ENCOUNTER — Encounter (INDEPENDENT_AMBULATORY_CARE_PROVIDER_SITE_OTHER): Payer: Self-pay | Admitting: General Surgery

## 2012-10-16 VITALS — BP 120/70 | HR 76 | Temp 99.0°F | Resp 16 | Ht 62.5 in | Wt 146.6 lb

## 2012-10-16 DIAGNOSIS — K5732 Diverticulitis of large intestine without perforation or abscess without bleeding: Secondary | ICD-10-CM

## 2012-10-16 NOTE — Progress Notes (Signed)
Subjective:     Patient ID: Tiffany Hood, female   DOB: July 22, 1940, 72 y.o.   MRN: [id number]  HPI The patient is a 72 year old white female who we saw 2 weeks ago with sigmoid colon diverticulitis. We started her on Cipro and Flagyl. The antibiotics caused her some diarrhea so she cut the Flagyl back to twice a day. She is now starting to have formed stool again. Her abdominal pain is almost completely resolved and she states that she feels 300% better  Review of Systems  Constitutional: Negative.   HENT: Negative.   Eyes: Negative.   Respiratory: Negative.   Cardiovascular: Negative.   Gastrointestinal: Positive for abdominal pain and diarrhea.  Endocrine: Negative.   Genitourinary: Negative.   Musculoskeletal: Negative.   Skin: Negative.   Allergic/Immunologic: Negative.   Neurological: Negative.   Hematological: Negative.   Psychiatric/Behavioral: Negative.        Objective:   Physical Exam  Constitutional: She is oriented to person, place, and time. She appears well-developed and well-nourished.  HENT:  Head: Normocephalic and atraumatic.  Eyes: Conjunctivae and EOM are normal. Pupils are equal, round, and reactive to light.  Neck: Normal range of motion. Neck supple.  Cardiovascular: Normal rate, regular rhythm and normal heart sounds.   Pulmonary/Chest: Effort normal and breath sounds normal.  Abdominal: Soft. Bowel sounds are normal.  She has very mild tenderness in the left lower quadrant but no guarding or peritonitis. There is no palpable mass.  Musculoskeletal: Normal range of motion.  Neurological: She is alert and oriented to person, place, and time.  Skin: Skin is warm and dry.  Psychiatric: She has a normal mood and affect. Her behavior is normal.       Assessment:     The patient appears to have a sigmoid colon diverticulitis that is resolving on antibiotic therapy.     Plan:     At this point I would like her to continue her current course of  antibiotics. We will plan to repeat her CT scan in about 3 weeks and see her back for a recheck once the scan is done

## 2012-10-16 NOTE — Patient Instructions (Addendum)
Refill the antibiotics if your pain recurs

## 2012-10-17 ENCOUNTER — Encounter: Payer: Self-pay | Admitting: Gastroenterology

## 2012-10-17 ENCOUNTER — Encounter (INDEPENDENT_AMBULATORY_CARE_PROVIDER_SITE_OTHER): Payer: Self-pay

## 2012-10-17 ENCOUNTER — Telehealth (INDEPENDENT_AMBULATORY_CARE_PROVIDER_SITE_OTHER): Payer: Self-pay | Admitting: General Surgery

## 2012-10-17 NOTE — Telephone Encounter (Signed)
Pt returned my call and I informed her that her Ct scan is on 8/11 at 11:45 at the [address] location.  Explained the contrast instructions and that she should have no solid foods after midnight.

## 2012-10-17 NOTE — Telephone Encounter (Signed)
LMOM asking pt to return my call. This is in regards to me giving instructions on her CT scan.

## 2012-10-30 ENCOUNTER — Encounter: Payer: Self-pay | Admitting: Oncology

## 2012-10-30 ENCOUNTER — Ambulatory Visit (AMBULATORY_SURGERY_CENTER): Payer: Medicare Other | Admitting: *Deleted

## 2012-10-30 ENCOUNTER — Encounter: Payer: Self-pay | Admitting: Gastroenterology

## 2012-10-30 ENCOUNTER — Ambulatory Visit (HOSPITAL_BASED_OUTPATIENT_CLINIC_OR_DEPARTMENT_OTHER): Payer: Medicare Other | Admitting: Oncology

## 2012-10-30 ENCOUNTER — Telehealth: Payer: Self-pay | Admitting: *Deleted

## 2012-10-30 ENCOUNTER — Other Ambulatory Visit: Payer: Medicare Other | Admitting: Lab

## 2012-10-30 ENCOUNTER — Ambulatory Visit (HOSPITAL_BASED_OUTPATIENT_CLINIC_OR_DEPARTMENT_OTHER): Payer: Medicare Other | Admitting: Lab

## 2012-10-30 VITALS — BP 138/83 | HR 101 | Temp 98.0°F | Resp 20 | Ht 62.5 in | Wt 146.3 lb

## 2012-10-30 VITALS — Ht 62.5 in | Wt 147.0 lb

## 2012-10-30 DIAGNOSIS — M810 Age-related osteoporosis without current pathological fracture: Secondary | ICD-10-CM

## 2012-10-30 DIAGNOSIS — Z853 Personal history of malignant neoplasm of breast: Secondary | ICD-10-CM

## 2012-10-30 DIAGNOSIS — E119 Type 2 diabetes mellitus without complications: Secondary | ICD-10-CM

## 2012-10-30 DIAGNOSIS — K5732 Diverticulitis of large intestine without perforation or abscess without bleeding: Secondary | ICD-10-CM

## 2012-10-30 DIAGNOSIS — I10 Essential (primary) hypertension: Secondary | ICD-10-CM

## 2012-10-30 LAB — CBC WITH DIFFERENTIAL/PLATELET
Basophils Absolute: 0 10*3/uL (ref 0.0–0.1)
EOS%: 4.4 % (ref 0.0–7.0)
Eosinophils Absolute: 0.4 10*3/uL (ref 0.0–0.5)
HCT: 38.7 % (ref 34.8–46.6)
HGB: 12.8 g/dL (ref 11.6–15.9)
MCH: 27.8 pg (ref 25.1–34.0)
MONO#: 0.6 10*3/uL (ref 0.1–0.9)
NEUT#: 5.7 10*3/uL (ref 1.5–6.5)
NEUT%: 66.5 % (ref 38.4–76.8)
lymph#: 1.8 10*3/uL (ref 0.9–3.3)

## 2012-10-30 LAB — COMPREHENSIVE METABOLIC PANEL (CC13)
ALT: 19 U/L (ref 0–55)
CO2: 28 mEq/L (ref 22–29)
Calcium: 10.6 mg/dL — ABNORMAL HIGH (ref 8.4–10.4)
Chloride: 104 mEq/L (ref 98–109)
Sodium: 142 mEq/L (ref 136–145)
Total Protein: 7.9 g/dL (ref 6.4–8.3)

## 2012-10-30 MED ORDER — MOVIPREP 100 G PO SOLR: ORAL | Status: DC

## 2012-10-30 NOTE — Progress Notes (Signed)
No allergies to eggs or soy. No problems with anesthesia.  

## 2012-10-30 NOTE — Telephone Encounter (Signed)
appts made and printed...td 

## 2012-10-30 NOTE — Patient Instructions (Addendum)
Proceed with blood work today  I will see you back in 1 year

## 2012-10-31 LAB — VITAMIN D 25 HYDROXY (VIT D DEFICIENCY, FRACTURES): Vit D, 25-Hydroxy: 75 ng/mL (ref 30–89)

## 2012-10-31 NOTE — Progress Notes (Signed)
Pt scheduled for colonoscopy with Dr. Jarold Motto 11/08/2012.  Previous colonoscopy with Dr. Leone Payor 2004.  Procedure rescheduled for 8/15 with Dr. Leone Payor.  Pt is referred to Dr. Leone Payor by Dr. Chevis Pretty for current diverticulitis flare.  (see OV with Dr. Carolynne Edouard 7/10)  She has finished taking Cipro and is currently taking Flagyl.  She denies abdominal pain and rectal bleeding.  She has follow up CT scan scheduled for Monday 8/11 and follow up  appt with Dr. Carolynne Edouard on 8/19.  I scheduled pt for OV with Dr. Leone Payor on Monday 8/11 at 3:15.  She was given instructions for colonoscopy while in [place] and colon scheduled for Friday 8/15 was not cancelled.

## 2012-11-01 ENCOUNTER — Other Ambulatory Visit: Payer: Self-pay

## 2012-11-06 ENCOUNTER — Ambulatory Visit
Admission: RE | Admit: 2012-11-06 | Discharge: 2012-11-06 | Disposition: A | Discharge status: Home / Self Care | Payer: Medicare Other | Source: Ambulatory Visit | Attending: General Surgery | Admitting: General Surgery

## 2012-11-06 ENCOUNTER — Other Ambulatory Visit: Payer: Self-pay | Admitting: Oncology

## 2012-11-06 ENCOUNTER — Other Ambulatory Visit: Payer: Self-pay | Admitting: Internal Medicine

## 2012-11-06 ENCOUNTER — Encounter: Payer: Self-pay | Admitting: Internal Medicine

## 2012-11-06 ENCOUNTER — Ambulatory Visit (INDEPENDENT_AMBULATORY_CARE_PROVIDER_SITE_OTHER): Payer: Medicare Other | Admitting: Internal Medicine

## 2012-11-06 VITALS — BP 118/70 | HR 121 | Ht 62.5 in | Wt 146.4 lb

## 2012-11-06 DIAGNOSIS — Z853 Personal history of malignant neoplasm of breast: Secondary | ICD-10-CM

## 2012-11-06 DIAGNOSIS — K5732 Diverticulitis of large intestine without perforation or abscess without bleeding: Secondary | ICD-10-CM

## 2012-11-06 MED ORDER — IOHEXOL 300 MG/ML  SOLN
100.0000 mL | Freq: Once | INTRAMUSCULAR | Status: AC | PRN
  Administered 2012-11-06: 100 mL via INTRAVENOUS

## 2012-11-06 NOTE — Assessment & Plan Note (Signed)
Improved but abscess has evolved into a gas collection with ? Connection to vagina. She has not noted any discharge or pneumaturia, etc. I think it should be ok to proceed with a colonoscopy but will clarify with Dr. Carolynne Edouard. Clinically she is resolved.

## 2012-11-06 NOTE — Progress Notes (Signed)
  Subjective:    Patient ID: Tiffany Hood, female    DOB: 1940-10-14, 72 y.o.   MRN: [id number]  HPI This very nice lady is here because she has a screening colonoscopy  Arranged for 8/15. She has been under Dr. Billey Chang care for diverticulitis with abscess and had a CT today that shows improvement but there is evolution to a gas-filled structure adjacent to sigmoid with possible track towards vagina - though no ckear fistula. She feels completely well and has completed Abx. She is going to travel to be with her son who needs surgery in the near future and is going to [place] in September.  Last colonoscopy 2004 - no polyps.  Medications, allergies, past medical history, past surgical history, family history and social history are reviewed and updated in the EMR.  Review of Systems As above    Objective:   Physical Exam WDWN NAD Lungs are clear Heart S1S2 no murmur Abdomen soft, BS+, no tenderness or mas    Assessment & Plan:  Diverticulitis of sigmoid colon with abscess

## 2012-11-06 NOTE — Telephone Encounter (Signed)
Noted, will send to MD as a Lorain Childes

## 2012-11-06 NOTE — Patient Instructions (Addendum)
Dr. Leone Payor is going to consult with Dr. Carolynne Edouard and we will contact you before 11/10/12 to advise about prepping for the colonoscopy.   I appreciate the opportunity to care for you.

## 2012-11-08 ENCOUNTER — Encounter: Payer: Medicare Other | Admitting: Gastroenterology

## 2012-11-09 ENCOUNTER — Telehealth: Payer: Self-pay

## 2012-11-09 ENCOUNTER — Telehealth (INDEPENDENT_AMBULATORY_CARE_PROVIDER_SITE_OTHER): Payer: Self-pay

## 2012-11-09 ENCOUNTER — Telehealth: Payer: Self-pay | Admitting: Internal Medicine

## 2012-11-09 NOTE — Telephone Encounter (Signed)
LMOM> No action needs to be taken now from her Ct results. OK to go ahead with colonoscopy 8/15. Pt to follow up with Dr Carolynne Edouard at scheduled appt.

## 2012-11-09 NOTE — Telephone Encounter (Signed)
Per dr Leone Payor he has sent a TE to pts surgeon and is waiting for response and his CMA PJ is working on this as well per dr Leone Payor. Pt informed of this and was told to do just clear liquids as directed for today until she hears from [place] this afternoon. Pt verbalized understanding of instructions. ewm

## 2012-11-09 NOTE — Telephone Encounter (Signed)
Patient informed and verbalized understanding that Dr. Carolynne Edouard has approved Dr. Leone Payor proceeding with the colonoscopy 11/10/12, patient will prep today.

## 2012-11-10 ENCOUNTER — Encounter: Payer: Self-pay | Admitting: Internal Medicine

## 2012-11-10 ENCOUNTER — Ambulatory Visit (AMBULATORY_SURGERY_CENTER): Payer: Medicare Other | Admitting: Internal Medicine

## 2012-11-10 VITALS — BP 137/71 | HR 74 | Temp 97.6°F | Resp 20 | Ht 62.0 in | Wt 146.0 lb

## 2012-11-10 DIAGNOSIS — K5732 Diverticulitis of large intestine without perforation or abscess without bleeding: Secondary | ICD-10-CM

## 2012-11-10 DIAGNOSIS — D126 Benign neoplasm of colon, unspecified: Secondary | ICD-10-CM

## 2012-11-10 DIAGNOSIS — K648 Other hemorrhoids: Secondary | ICD-10-CM

## 2012-11-10 DIAGNOSIS — K5730 Diverticulosis of large intestine without perforation or abscess without bleeding: Secondary | ICD-10-CM

## 2012-11-10 MED ORDER — SODIUM CHLORIDE 0.9 % IV SOLN: 500.0000 mL | INTRAVENOUS | Status: DC

## 2012-11-10 NOTE — Progress Notes (Signed)
Called to room to assist during endoscopic procedure.  Patient ID and intended procedure confirmed with present staff. Received instructions for my participation in the procedure from the performing physician.  

## 2012-11-10 NOTE — Progress Notes (Signed)
Patient did not have preoperative order for IV antibiotic SSI prophylaxis. (G8918)  Patient did not experience any of the following events: a burn prior to discharge; a fall within the facility; wrong site/side/patient/procedure/implant event; or a hospital transfer or hospital admission upon discharge from the facility. (G8907)  

## 2012-11-10 NOTE — Progress Notes (Signed)
A/ox3 pleased with MAC, report to Wendy RN 

## 2012-11-10 NOTE — Op Note (Signed)
[facility] [address] [place], [address]   COLONOSCOPY PROCEDURE REPORT  PATIENT: Tiffany Hood, Tiffany Hood  MR#: [id number] BIRTHDATE: 1940/12/06 , 71  yrs. old GENDER: Female ENDOSCOPIST: Iva Boop, MD, [facility] PROCEDURE DATE:  11/10/2012 PROCEDURE:   Colonoscopy with biopsy ASA CLASS:   Class II INDICATIONS:diverticulitis with abscess. MEDICATIONS: propofol (Diprivan) 350mg  IV, MAC sedation, administered by CRNA, and These medications were titrated to patient response per physician's verbal order  DESCRIPTION OF PROCEDURE:   After the risks benefits and alternatives of the procedure were thoroughly explained, informed consent was obtained.  A digital rectal exam revealed no abnormalities of the rectum.   The LB [id number]-[id number]  endoscope was introduced through the anus and advanced to the cecum, which was identified by both the appendix and ileocecal valve. No adverse events experienced.   The quality of the prep was excellent, using MoviPrep  The instrument was then slowly withdrawn as the colon was fully examined.  COLON FINDINGS: There was severe diverticulosis noted in the sigmoid colon with associated colonic narrowing and angulation in distal sigmoid.   Moderate diverticulosis was noted throughout the entire examined colon otherwise.   Four sessile polyps measuring 1-2 mm in size were found at the cecum.  A polypectomy was performed with cold forceps.  The resection was complete and the polyp tissue was completely retrieved.   The colon mucosa was otherwise normal.   A right colon retroflexion was performed.  Retroflexed views revealed no abnormalities. The time to cecum=15 minutes 18 seconds. Withdrawal time=7 minutes 04 seconds.  The scope was withdrawn and the procedure completed. COMPLICATIONS: There were no complications.  ENDOSCOPIC IMPRESSION: 1.   There was severe diverticulosis noted in the distal sigmoid colon with stenosis requiring use of  gastroscope to pass (failed with adult and pediatric colonoscopes). 2.   Moderate diverticulosis was noted throughout the entire examined colon otherwise 3.   Four sessile polyps measuring 1-2 mm in size were found at the cecum; polypectomy was performed with cold forceps 4.   The colon mucosa was otherwise normal  RECOMMENDATIONS: 1.  Timing of repeat colonoscopy will be determined by pathology findings. 2.   F/U Dr.  Carolynne Edouard as planned - she does report she ended up having vaginal dc after CT scan 8/11.  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   eSigned:  Iva Boop, MD, [facility] 11/10/2012 10:05 AM  cc: Pecola Lawless, MD, Chevis Pretty, MD, and The Patient

## 2012-11-10 NOTE — Patient Instructions (Addendum)
I found and removed 4 tiny polyps that look benign. There was no diverticulitis but there is narrowing of the colon from prior problems/scarring and I had to use a small scope to pass through. You also have some hemorrhoids.  The leakage of fluid from vagina after the CT suggests you may have a connection between the abscess site/bowel and vagina. Please watch for any mor discharge from that area.  I will let you know pathology results and when to have another routine colonoscopy by mail. I appreciate the opportunity to care for you.  Iva Boop, MD, FACG  YOU HAD AN ENDOSCOPIC PROCEDURE TODAY AT THE [facility]: Refer to the procedure report that was given to you for any specific questions about what was found during the examination.  If the procedure report does not answer your questions, please call your gastroenterologist to clarify.  If you requested that your care partner not be given the details of your procedure findings, then the procedure report has been included in a sealed envelope for you to review at your convenience later.  YOU SHOULD EXPECT: Some feelings of bloating in the abdomen. Passage of more gas than usual.  Walking can help get rid of the air that was put into your GI tract during the procedure and reduce the bloating. If you had a lower endoscopy (such as a colonoscopy or flexible sigmoidoscopy) you may notice spotting of blood in your stool or on the toilet paper. If you underwent a bowel prep for your procedure, then you may not have a normal bowel movement for a few days.  DIET: Your first meal following the procedure should be a light meal and then it is ok to progress to your normal diet.  A half-sandwich or bowl of soup is an example of a good first meal.  Heavy or fried foods are harder to digest and may make you feel nauseous or bloated.  Likewise meals heavy in dairy and vegetables can cause extra gas to form and this can also increase the bloating.   Drink plenty of fluids but you should avoid alcoholic beverages for 24 hours.  ACTIVITY: Your care partner should take you home directly after the procedure.  You should plan to take it easy, moving slowly for the rest of the day.  You can resume normal activity the day after the procedure however you should NOT DRIVE or use heavy machinery for 24 hours (because of the sedation medicines used during the test).    SYMPTOMS TO REPORT IMMEDIATELY: A gastroenterologist can be reached at any hour.  During normal business hours, 8:30 AM to 5:00 PM Monday through Friday, call [phone].  After hours and on weekends, please call the GI answering service at [phone] who will take a message and have the physician on call contact you.   Following lower endoscopy (colonoscopy or flexible sigmoidoscopy):  Excessive amounts of blood in the stool  Significant tenderness or worsening of abdominal pains  Swelling of the abdomen that is new, acute  Fever of 100F or higher  FOLLOW UP: If any biopsies were taken you will be contacted by phone or by letter within the next 1-3 weeks.  Call your gastroenterologist if you have not heard about the biopsies in 3 weeks.  Our staff will call the home number listed on your records the next business day following your procedure to check on you and address any questions or concerns that you may have at that  time regarding the information given to you following your procedure. This is a courtesy call and so if there is no answer at the home number and we have not heard from you through the emergency physician on call, we will assume that you have returned to your regular daily activities without incident.  SIGNATURES/CONFIDENTIALITY: You and/or your care partner have signed paperwork which will be entered into your electronic medical record.  These signatures attest to the fact that that the information above on your After Visit Summary has been reviewed and is  understood.  Full responsibility of the confidentiality of this discharge information lies with you and/or your care-partner.

## 2012-11-13 ENCOUNTER — Telehealth: Payer: Self-pay | Admitting: *Deleted

## 2012-11-13 NOTE — Telephone Encounter (Signed)
  Follow up Call-  Call back number 11/10/2012  Post procedure Call Back phone  # [phone]  Permission to leave phone message Yes     No answer, unable to leave message "mail box full".Marland KitchenMarland KitchenMarland Kitchen

## 2012-11-14 ENCOUNTER — Encounter (INDEPENDENT_AMBULATORY_CARE_PROVIDER_SITE_OTHER): Payer: Medicare Other | Admitting: General Surgery

## 2012-11-15 ENCOUNTER — Encounter: Payer: Self-pay | Admitting: Internal Medicine

## 2012-11-15 DIAGNOSIS — Z860100 Personal history of colon polyps, unspecified: Secondary | ICD-10-CM

## 2012-11-15 HISTORY — DX: Personal history of colon polyps, unspecified: Z86.0100

## 2012-11-15 HISTORY — DX: Personal history of colonic polyps: Z86.010

## 2012-11-15 NOTE — Progress Notes (Signed)
Quick Note:  2 diminutive adenomas Repeat colon 5 yrs 2019 ______

## 2012-11-19 NOTE — Progress Notes (Signed)
OFFICE PROGRESS NOTE  CC**  Marga Melnick, MD [phone] 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 She went on to complete radiation therapy adjuvantly on 08/19/2012.  #2 patient has been on Arimidex 1 mg daily since June 2004.  CURRENT THERAPY: observation  INTERVAL HISTORY: Gurleen Larrivee 72 y.o. female returns for followup visit. She is establishing her care with me in absence of Dr. Donnie Coffin. Clinically she seems to be doing well without any problems. She denies any fevers chills night sweats headaches shortness of breath chest pains palpitations no myalgias and arthralgias. Remainder of the 10 point review of systems is negative.  MEDICAL HISTORY: Past Medical History  Diagnosis Date  . Hypertension   . Hyperlipidemia   . Osteoporosis     on Arimidex  . Diabetes mellitus   . Breast cancer left  . Diverticulitis     09/14/12  . Diverticulosis   . Personal history of colonic adenomas 11/15/2012    ALLERGIES:  has No Known Allergies.  MEDICATIONS:  Current Outpatient Prescriptions  Medication Sig Dispense Refill  . aspirin 81 MG tablet Take 81 mg by mouth. M/W/F       . Calcium Carbonate-Vitamin D (CALTRATE 600+D PO) Take 1 capsule by mouth daily.       . folic acid (FOLVITE) 1 MG tablet TAKE ONE TABLET BY MOUTH EVERY DAY  100 tablet  1  . Lancets (ONETOUCH ULTRASOFT) lancets USE AS DIRECTED ONCE DAILY  100 each  3  . meloxicam (MOBIC) 15 MG tablet Take 15 mg by mouth daily.      . Multiple Vitamin (MULTIVITAMIN) tablet Take 1 tablet by mouth daily.        . Omega-3 Fatty Acids (FISH OIL) 1000 MG CAPS Take by mouth daily.        . ONE TOUCH ULTRA TEST test strip USE AS DIRECTED ONCE DAILY  100 each  3  . triamterene-hydrochlorothiazide (MAXZIDE) 75-50 MG per tablet TAKE ONE-HALF TABLET BY MOUTH EVERY DAY  90  tablet  0  . vitamin E 400 UNIT capsule Take 400 Units by mouth daily.        Marland Kitchen alendronate (FOSAMAX) 70 MG tablet TAKE ONE TABLET BY MOUTH ONCE A WEEK WITH  A  FULL  GLASS  OF  WATER  ON  AN  EMPTY  STOMACH  12 tablet  PRN  . losartan (COZAAR) 50 MG tablet TAKE ONE TABLET BY MOUTH EVERY DAY  90 tablet  1  . pravastatin (PRAVACHOL) 40 MG tablet TAKE ONE TABLET BY MOUTH EVERY DAY  90 tablet  0   No current facility-administered medications for this visit.    SURGICAL HISTORY:  Past Surgical History  Procedure Laterality Date  . Mastectomy partial / lumpectomy w/ axillary lymphadenectomy Left 05/07/2002    with SLN; 35 treatments of radiation  . Abdominal hysterectomy  1965    ovaries remain; for dysfunctional menses; ? endometriosis  . Tonsillectomy and adenoidectomy    . Colonoscopy  2004     Tics  . Removal bone spur Left 2/14    2nd finger    REVIEW OF SYSTEMS:  Pertinent items are noted in HPI.   HEALTH MAINTENANCE:  PHYSICAL EXAMINATION: Blood pressure 138/83, pulse 101, temperature 98 F (36.7 C), temperature source Oral, resp. rate 20, height 5' 2.5" (1.588 m), weight  146 lb 4.8 oz (66.361 kg). Body mass index is 26.32 kg/(m^2). ECOG PERFORMANCE STATUS: 0 - Asymptomatic   General appearance: alert, cooperative and appears stated age Resp: clear to auscultation bilaterally Cardio: regular rate and rhythm GI: soft, non-tender; bowel sounds normal; no masses,  no organomegaly Extremities: extremities normal, atraumatic, no cyanosis or edema Neurologic: Grossly normal   LABORATORY DATA: Lab Results  Component Value Date   WBC 8.5 10/30/2012   HGB 12.8 10/30/2012   HCT 38.7 10/30/2012   MCV 84.4 10/30/2012   PLT 256 10/30/2012      Chemistry      Component Value Date/Time   NA 142 10/30/2012 1325   NA 139 01/19/2012 1220   K 4.3 10/30/2012 1325   K 4.3 01/19/2012 1220   CL 103 01/19/2012 1220   CO2 28 10/30/2012 1325   CO2 30 01/19/2012 1220   BUN 19.9 10/30/2012 1325    BUN 27* 01/19/2012 1220   CREATININE 0.7 10/30/2012 1325   CREATININE 0.8 01/19/2012 1220      Component Value Date/Time   CALCIUM 10.6* 10/30/2012 1325   CALCIUM 9.0 01/19/2012 1220   ALKPHOS 71 10/30/2012 1325   ALKPHOS 61 01/19/2012 1220   AST 21 10/30/2012 1325   AST 14 01/19/2012 1220   ALT 19 10/30/2012 1325   ALT 19 01/19/2012 1220   BILITOT 0.30 10/30/2012 1325   BILITOT 0.4 01/19/2012 1220       RADIOGRAPHIC STUDIES:  Ct Abdomen Pelvis W Contrast  11/06/2012   *RADIOLOGY REPORT*  Clinical Data: Diverticulitis  CT ABDOMEN AND PELVIS WITH CONTRAST  Technique:  Multidetector CT imaging of the abdomen and pelvis was performed following the standard protocol during bolus administration of intravenous contrast.  Contrast: OMNIPAQUE IOHEXOL 300 MG/ML  SOLN  Comparison: MRI from 10/03/2012.  CT from 09/20/2012.  Findings: No focal abnormalities seen in the liver or spleen.  The stomach, pancreas, gallbladder, and adrenal glands are unremarkable.  Large duodenal diverticulum noted.  Cortical scarring is noted in both kidneys with 9 mm cyst in the upper pole of the left kidney.  No abdominal aortic aneurysm.  There is no free fluid or lymphadenopathy in the abdomen.  Imaging through the pelvis shows no intraperitoneal free fluid.  No pelvic sidewall lymphadenopathy.  The bladder is unremarkable. Uterus is surgically absent.  The pericolonic abscess seen on the previous MRI has evolved now into a focal collection of gas adjacent to the proximal sigmoid colon.  From this location, there is extraluminal gas tracking down under the sigmoid colon towards the left vaginal cuff.  The gas generates mass effect on the posterolateral left side of the bladder.  There also appears to be some gas in the upper vagina. No evidence for gas within the urinary bladder and there is no substantial asymmetric posterior bladder wall thickening.  Bone windows reveal no worrisome lytic or sclerotic osseous lesions.   IMPRESSION: Previously visualized collection of gas and fluid adjacent to the proximal sigmoid colon has evolved into a simple thin-walled gas collection on the current study.  However, there is now gas tracking from this location under the sigmoid colon towards the midline pelvis and gas appears to be extraperitoneal, in the soft tissues of the pelvic floor. There may be some gas in the upper vagina.  There is no free fluid or pericolonic fluid collection on today's study to suggest a frank abscess.  Colovaginal fistula is not excluded.  The gas tracks right up to  the posterolateral bladder wall, but there is no focal bladder wall thickening as typically seen in cases of colovesical fistula.   Original Report Authenticated By: Kennith Center, M.D.    ASSESSMENT: 72 year old female with  #1 history of breast cancer that was ER positive status post lumpectomy followed by radiation therapy on Arimidex for 5 years. Overall she's doing well she is seen on a yearly basis. She has no evidence of recurrent disease  PLAN:   #1 we will see her back in one years time   All questions were answered. The patient knows to call the clinic with any problems, questions or concerns. We can certainly see the patient much sooner if necessary.  I spent 15 minutes counseling the patient face to face. The total time spent in the appointment was 20 minutes.    Drue Second, MD Medical/Oncology [facility] [phone] (beeper) [phone] (Office)

## 2012-11-30 ENCOUNTER — Encounter (INDEPENDENT_AMBULATORY_CARE_PROVIDER_SITE_OTHER): Payer: Self-pay | Admitting: General Surgery

## 2012-11-30 ENCOUNTER — Ambulatory Visit (INDEPENDENT_AMBULATORY_CARE_PROVIDER_SITE_OTHER): Payer: Medicare Other | Admitting: General Surgery

## 2012-11-30 VITALS — BP 130/70 | HR 74 | Resp 18 | Ht 62.5 in | Wt 142.0 lb

## 2012-11-30 DIAGNOSIS — K5732 Diverticulitis of large intestine without perforation or abscess without bleeding: Secondary | ICD-10-CM

## 2012-11-30 NOTE — Patient Instructions (Signed)
Plan for contrast enema study then lap assisted sigmoid colectomy

## 2012-12-26 ENCOUNTER — Other Ambulatory Visit: Payer: Medicare Other

## 2012-12-27 ENCOUNTER — Ambulatory Visit
Admission: RE | Admit: 2012-12-27 | Discharge: 2012-12-27 | Disposition: A | Discharge status: Home / Self Care | Payer: Medicare Other | Source: Ambulatory Visit | Attending: General Surgery | Admitting: General Surgery

## 2012-12-27 DIAGNOSIS — K5732 Diverticulitis of large intestine without perforation or abscess without bleeding: Secondary | ICD-10-CM

## 2013-01-02 ENCOUNTER — Encounter: Payer: Self-pay | Admitting: Internal Medicine

## 2013-01-08 ENCOUNTER — Other Ambulatory Visit (INDEPENDENT_AMBULATORY_CARE_PROVIDER_SITE_OTHER): Payer: Self-pay | Admitting: General Surgery

## 2013-01-08 NOTE — Progress Notes (Signed)
Patient ID: Tiffany Hood, female   DOB: 1940/06/27, 72 y.o.   MRN: [id number]  Chief Complaint  Patient presents with  . Follow-up    1 mo recheck diverticulitis    HPI Tiffany Hood is a 72 y.o. female.  The patient is a 72 year old white female who we have been following for sigmoid diverticulitis with abscess. Although she has improved on antibiotics her abscess cavity has never gone away. More recently she has been complaining of an off and on vaginal discharge. She describes it as a brownish discharge. She denies any Odor to the discharge. She denies any fevers or chills. She continues to have some left lower quadrant pain. HPI  Past Medical History  Diagnosis Date  . Hypertension   . Hyperlipidemia   . Osteoporosis     on Arimidex  . Diabetes mellitus   . Breast cancer left  . Diverticulitis     09/14/12  . Diverticulosis   . Personal history of colonic adenomas 11/15/2012    Past Surgical History  Procedure Laterality Date  . Mastectomy partial / lumpectomy w/ axillary lymphadenectomy Left 05/07/2002    with SLN; 35 treatments of radiation  . Abdominal hysterectomy  1965    ovaries remain; for dysfunctional menses; ? endometriosis  . Tonsillectomy and adenoidectomy    . Colonoscopy  2004     Tics  . Removal bone spur Left 2/14    2nd finger    Family History  Problem Relation Age of Onset  . Stroke Mother 22  . Hypertension Mother   . Lung cancer Father   . Hypertension Sister   . Hypertension Brother   . Stroke Brother 49  . Heart disease Brother 72  . Diabetes Neg Hx   . Colon cancer Neg Hx     Social History History  Substance Use Topics  . Smoking status: Never Smoker   . Smokeless tobacco: Never Used  . Alcohol Use: No    No Known Allergies  Current Outpatient Prescriptions  Medication Sig Dispense Refill  . alendronate (FOSAMAX) 70 MG tablet TAKE ONE TABLET BY MOUTH ONCE A WEEK WITH  A  FULL  GLASS  OF  WATER  ON  AN  EMPTY  STOMACH  12  tablet  PRN  . aspirin 81 MG tablet Take 81 mg by mouth. M/W/F       . Calcium Carbonate-Vitamin D (CALTRATE 600+D PO) Take 1 capsule by mouth daily.       . folic acid (FOLVITE) 1 MG tablet TAKE ONE TABLET BY MOUTH EVERY DAY  100 tablet  1  . Lancets (ONETOUCH ULTRASOFT) lancets USE AS DIRECTED ONCE DAILY  100 each  3  . losartan (COZAAR) 50 MG tablet TAKE ONE TABLET BY MOUTH EVERY DAY  90 tablet  1  . meloxicam (MOBIC) 15 MG tablet Take 15 mg by mouth daily.      . Multiple Vitamin (MULTIVITAMIN) tablet Take 1 tablet by mouth daily.        . Omega-3 Fatty Acids (FISH OIL) 1000 MG CAPS Take by mouth daily.        . ONE TOUCH ULTRA TEST test strip USE AS DIRECTED ONCE DAILY  100 each  3  . pravastatin (PRAVACHOL) 40 MG tablet TAKE ONE TABLET BY MOUTH EVERY DAY  90 tablet  0  . triamterene-hydrochlorothiazide (MAXZIDE) 75-50 MG per tablet TAKE ONE-HALF TABLET BY MOUTH EVERY DAY  90 tablet  0  . vitamin E 400 UNIT capsule  Take 400 Units by mouth daily.         No current facility-administered medications for this visit.    Review of Systems Review of Systems  Constitutional: Negative.   HENT: Negative.   Eyes: Negative.   Respiratory: Negative.   Cardiovascular: Negative.   Gastrointestinal: Positive for abdominal pain.  Endocrine: Negative.   Genitourinary: Positive for vaginal discharge.  Musculoskeletal: Negative.   Skin: Negative.   Allergic/Immunologic: Negative.   Neurological: Negative.   Hematological: Negative.   Psychiatric/Behavioral: Negative.     Blood pressure 130/70, pulse 74, resp. rate 18, height 5' 2.5" (1.588 m), weight 142 lb (64.411 kg).  Physical Exam Physical Exam  Constitutional: She is oriented to person, place, and time. She appears well-developed and well-nourished.  HENT:  Head: Normocephalic and atraumatic.  Eyes: Conjunctivae and EOM are normal. Pupils are equal, round, and reactive to light.  Neck: Normal range of motion. Neck supple.   Cardiovascular: Normal rate, regular rhythm and normal heart sounds.   Pulmonary/Chest: Effort normal and breath sounds normal.  Abdominal: Soft. Bowel sounds are normal.  There is still some left lower quadrant tenderness but no guarding or peritonitis  Musculoskeletal: Normal range of motion.  Neurological: She is alert and oriented to person, place, and time.  Skin: Skin is warm and dry.  Psychiatric: She has a normal mood and affect. Her behavior is normal.    Data Reviewed As above  Assessment    The patient has a complicated diverticulitis with abscess that has not fully resolved with antibiotics. Because of this I think she has a high chance of recurrence and would probably benefit from an elective sigmoid colectomy. We will also plan to obtain a barium enema to look for any sign of colovaginal fistula. I've discussed with her in detail the risks and benefits of the operation to remove the diseased segment of her sigmoid colon including the risk of leak as well as some of the technical aspects of the surgery and she understands and wishes to proceed     Plan    Plan for laparoscopic assisted sigmoid colectomy        TOTH III,Patton Swisher S 01/08/2013, 4:58 PM

## 2013-01-09 ENCOUNTER — Telehealth (INDEPENDENT_AMBULATORY_CARE_PROVIDER_SITE_OTHER): Payer: Self-pay

## 2013-01-09 NOTE — Telephone Encounter (Signed)
Called pt and let her know I put surgery orders in schedulers box and they would be calling her to get her scheduled. Went over medication list and advised her to stop her asa, fish oil and vitamin e 5 days prior to surgery. I let her know I mailed her one day miralax prep with NO abx to her. She will call if she has any questions regarding it.

## 2013-01-17 ENCOUNTER — Telehealth (INDEPENDENT_AMBULATORY_CARE_PROVIDER_SITE_OTHER): Payer: Self-pay

## 2013-01-17 NOTE — Telephone Encounter (Signed)
Message copied by Brennan Bailey on Wed Jan 17, 2013  4:17 PM ------      Message from: Larry Sierras      Created: Thu Jan 04, 2013  2:51 PM      Regarding: SX ORDERS?      Contact: [phone]       PT REQ STATUS ON SCHED SX.  HAVE ORDERS BEEN WRITTEN? GM ------

## 2013-01-17 NOTE — Telephone Encounter (Signed)
Patient called back at this time to state that if Dr. Carolynne Edouard is ok with her putting off surgery then that is what she wants to do.  Patient states she will get back in touch with [place] end of November, beginning of December to reschedule for January.  Patient states she doesn't feel she has any questions and doesn't need to come in tomorrow so appt cancelled tomorrow and PO in December cancelled per patient's request.  Request to cancel surgery taken to Cecilie Kicks who states she will work on it.

## 2013-01-17 NOTE — Telephone Encounter (Signed)
LMOM>Pt scheduled. She asked schedulers if she could wait until next year to have surgery and Dr Carolynne Edouard said yes. She probably would not have to repeat barium enema study. She does not have to come in tomorrow unless she fells she needs to talk to him. If she wants to come in can she come in earlier around 3:30?

## 2013-01-18 ENCOUNTER — Encounter (INDEPENDENT_AMBULATORY_CARE_PROVIDER_SITE_OTHER): Payer: Medicare Other | Admitting: General Surgery

## 2013-02-05 ENCOUNTER — Inpatient Hospital Stay (HOSPITAL_COMMUNITY): Admission: RE | Admit: 2013-02-05 | Payer: Medicare Other | Source: Ambulatory Visit | Admitting: General Surgery

## 2013-02-05 ENCOUNTER — Encounter (HOSPITAL_COMMUNITY): Admission: RE | Payer: Self-pay | Source: Ambulatory Visit

## 2013-02-05 SURGERY — COLECTOMY, SIGMOID, LAPAROSCOPIC
Anesthesia: General

## 2013-02-06 ENCOUNTER — Other Ambulatory Visit: Payer: Self-pay | Admitting: Internal Medicine

## 2013-02-06 NOTE — Telephone Encounter (Signed)
Folic acid and pravastatin refilled for one month. OV due

## 2013-02-13 ENCOUNTER — Other Ambulatory Visit: Payer: Self-pay | Admitting: *Deleted

## 2013-02-13 MED ORDER — ONETOUCH ULTRASOFT LANCETS MISC: Status: DC

## 2013-02-13 MED ORDER — GLUCOSE BLOOD VI STRP: ORAL_STRIP | Status: DC

## 2013-02-13 NOTE — Telephone Encounter (Signed)
Lancets and test strips refilled per protocol

## 2013-02-27 ENCOUNTER — Encounter (INDEPENDENT_AMBULATORY_CARE_PROVIDER_SITE_OTHER): Payer: Medicare Other | Admitting: General Surgery

## 2013-03-27 ENCOUNTER — Telehealth: Payer: Self-pay

## 2013-03-27 NOTE — Telephone Encounter (Addendum)
Left message for call back  identifiable Medication and allergies:  Reviewed and updated  90 day supply/mail order: na Local pharmacy: [facility]   Immunizations due:  Tdap and PNA  A/P:   No changes to FH or PSH or personal hx CCS--10/2012--Dr Gessner--adenomatous polyps--next due 2019 MMG--05/2012--neg Dexa--04/2011 Flu vaccine--12/2012 Shingles--11/2011  To Discuss with Provider: Pending surgery in Jan for sigmoid colon surgery Needs Tdap and PNA; checking with insurance to see if they will pay for at today's visit.

## 2013-03-30 ENCOUNTER — Encounter: Payer: Self-pay | Admitting: Internal Medicine

## 2013-03-30 ENCOUNTER — Ambulatory Visit (INDEPENDENT_AMBULATORY_CARE_PROVIDER_SITE_OTHER): Payer: Medicare Other | Admitting: Internal Medicine

## 2013-03-30 VITALS — BP 126/70 | HR 88 | Temp 98.0°F | Ht 62.5 in | Wt 148.0 lb

## 2013-03-30 DIAGNOSIS — Z23 Encounter for immunization: Secondary | ICD-10-CM

## 2013-03-30 DIAGNOSIS — I10 Essential (primary) hypertension: Secondary | ICD-10-CM

## 2013-03-30 DIAGNOSIS — R7309 Other abnormal glucose: Secondary | ICD-10-CM

## 2013-03-30 DIAGNOSIS — Z0000 Encounter for general adult medical examination without abnormal findings: Secondary | ICD-10-CM

## 2013-03-30 DIAGNOSIS — E785 Hyperlipidemia, unspecified: Secondary | ICD-10-CM

## 2013-03-30 DIAGNOSIS — M8580 Other specified disorders of bone density and structure, unspecified site: Secondary | ICD-10-CM | POA: Insufficient documentation

## 2013-03-30 DIAGNOSIS — R7303 Prediabetes: Secondary | ICD-10-CM

## 2013-03-30 MED ORDER — TRIAMTERENE-HCTZ 75-50 MG PO TABS: ORAL_TABLET | ORAL | Status: DC

## 2013-03-30 MED ORDER — PRAVASTATIN SODIUM 40 MG PO TABS: ORAL_TABLET | ORAL | Status: DC

## 2013-03-30 MED ORDER — LOSARTAN POTASSIUM 50 MG PO TABS: ORAL_TABLET | ORAL | Status: DC

## 2013-03-30 NOTE — Patient Instructions (Signed)
Please  schedule fasting Labs : calcium,Lipids,  TSH.Marland Kitchen

## 2013-03-30 NOTE — Progress Notes (Signed)
Subjective:    Patient ID: Tiffany Hood, female    DOB: 1940-05-19, 73 y.o.   MRN: [id number]  HPI Medicare Wellness Visit: Psychosocial and medical history were reviewed as required by Medicare (history related to abuse, antisocial behavior , firearm risk). Social history: Caffeine:no  , Alcohol:no  , Tobacco DUK:GURKY Exercise:see below Personal safety/fall risk:no Limitations of activities of daily living:no Seatbelt/ smoke alarm use:yes Healthcare Power of Attorney/Living Will status: needed Ophthalmologic exam status:overdue Hearing evaluation status:not current Orientation: Oriented X3 Memory and recall: good Spelling  testing: good Depression/anxiety assessment: no Foreign travel history:[place] 9/14 Immunization status for influenza/pneumonia/ shingles /tetanus:tetanus today Transfusion history:no Preventive health care maintenance status: Colonoscopy/BMD/mammogram/Pap as per protocol/standard care:UTD Dental care:every 6 mos Chart reviewed and updated. Active issues reviewed and addressed as documented below.    Review of Systems A heart healthy diet is followed; exercise encompasses 30 minutes 2  times per week as walking without symptoms.  Family history is positive for premature coronary disease. Advanced cholesterol testing reveals  LDL goal is less than 100 ; ideally <70 . There is medication compliance with the statin.  Low dose ASA taken Specifically denied are  chest pain, palpitations, dyspnea, or claudication.  Significant abdominal symptoms with diverticulitis; resection planned this month. No memory deficit or myalgias not present. BP controlled @ MD visits.     Objective:   Physical Exam  Gen.: Healthy and well-nourished in appearance. Alert, appropriate and cooperative throughout exam.Appears younger than stated age  Head: Normocephalic without obvious abnormalities  Eyes: No corneal or conjunctival inflammation noted. Pupils equal round reactive to  light and accommodation. Extraocular motion intact. Vision grossly normal with lenses Ears: External  ear exam reveals no significant lesions or deformities. Canals clear .TMs normal. Hearing is grossly normal bilaterally. Nose: External nasal exam reveals no deformity or inflammation. Nasal mucosa are pink and moist. No lesions or exudates noted.  Mouth: Oral mucosa and oropharynx reveal no lesions or exudates. Teeth in good repair. Neck: No deformities, masses, or tenderness noted. Range of motion normal. Thyroid :L lobe > R w/o nodules. Lungs: Normal respiratory effort; chest expands symmetrically. Lungs are clear to auscultation without rales, wheezes, or increased work of breathing. Heart: Normal rate and rhythm. Normal S1 and S2. No gallop, click, or rub. S4 w/o murmur. Abdomen: Bowel sounds normal; abdomen soft and nontender. No masses, organomegaly or hernias noted. Genitalia:  as per Gyn                                  Musculoskeletal/extremities: No deformity or scoliosis noted of  the thoracic or lumbar spine.   No clubbing, cyanosis, edema, or significant extremity  deformity noted. Range of motion normal .Tone & strength normal. 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 Fingernail  health good. Able to lie down & sit up w/o help. Negative SLR bilaterally Vascular: Carotid, radial artery, dorsalis pedis and  posterior tibial pulses are full and equal. No bruits present. Neurologic: Alert and oriented x3. Deep tendon reflexes symmetrical and normal.        Skin: Intact without suspicious lesions or rashes. Lymph: No cervical, axillary lymphadenopathy present. Psych: Mood and affect are normal. Normally interactive  Assessment & Plan:  #1 Medicare Wellness Exam; criteria met ; data entered #2 Problem List/Diagnoses reviewed Plan:  Assessments made/ Orders entered  

## 2013-03-30 NOTE — Progress Notes (Signed)
Pre visit review using our clinic review tool, if applicable. No additional management support is needed unless otherwise documented below in the visit note. 

## 2013-04-02 ENCOUNTER — Other Ambulatory Visit (INDEPENDENT_AMBULATORY_CARE_PROVIDER_SITE_OTHER): Payer: Medicare Other

## 2013-04-02 DIAGNOSIS — R7303 Prediabetes: Secondary | ICD-10-CM

## 2013-04-02 DIAGNOSIS — R7309 Other abnormal glucose: Secondary | ICD-10-CM

## 2013-04-02 DIAGNOSIS — E785 Hyperlipidemia, unspecified: Secondary | ICD-10-CM

## 2013-04-02 LAB — LIPID PANEL
Cholesterol: 188 mg/dL (ref 0–200)
HDL: 56.5 mg/dL (ref >39.00)
Total CHOL/HDL Ratio: 3
Triglycerides: 222 mg/dL — ABNORMAL HIGH (ref 0.0–149.0)
VLDL: 44.4 mg/dL — ABNORMAL HIGH (ref 0.0–40.0)

## 2013-04-02 LAB — HEMOGLOBIN A1C: Hgb A1c MFr Bld: 6.5 % (ref 4.6–6.5)

## 2013-04-02 LAB — TSH: TSH: 1.34 u[IU]/mL (ref 0.35–5.50)

## 2013-04-02 LAB — CALCIUM: CALCIUM: 8.8 mg/dL (ref 8.4–10.5)

## 2013-04-03 ENCOUNTER — Other Ambulatory Visit: Payer: Self-pay | Admitting: Internal Medicine

## 2013-04-03 ENCOUNTER — Encounter (HOSPITAL_COMMUNITY): Payer: Self-pay | Admitting: Pharmacy Technician

## 2013-04-03 DIAGNOSIS — R7303 Prediabetes: Secondary | ICD-10-CM

## 2013-04-03 LAB — LDL CHOLESTEROL, DIRECT: Direct LDL: 94.6 mg/dL

## 2013-04-05 ENCOUNTER — Encounter (HOSPITAL_COMMUNITY)
Admission: RE | Admit: 2013-04-05 | Discharge: 2013-04-05 | Disposition: A | Discharge status: Home / Self Care | Payer: Medicare Other | Source: Ambulatory Visit | Attending: General Surgery | Admitting: General Surgery

## 2013-04-05 ENCOUNTER — Encounter (HOSPITAL_COMMUNITY): Payer: Self-pay

## 2013-04-05 DIAGNOSIS — Z01818 Encounter for other preprocedural examination: Secondary | ICD-10-CM | POA: Insufficient documentation

## 2013-04-05 DIAGNOSIS — Z01812 Encounter for preprocedural laboratory examination: Secondary | ICD-10-CM | POA: Insufficient documentation

## 2013-04-05 DIAGNOSIS — Z01810 Encounter for preprocedural cardiovascular examination: Secondary | ICD-10-CM | POA: Insufficient documentation

## 2013-04-05 LAB — CBC
HCT: 37.4 % (ref 36.0–46.0)
Hemoglobin: 12.4 g/dL (ref 12.0–15.0)
MCH: 28.5 pg (ref 26.0–34.0)
MCHC: 33.2 g/dL (ref 30.0–36.0)
MCV: 86 fL (ref 78.0–100.0)
PLATELETS: 229 10*3/uL (ref 150–400)
RBC: 4.35 MIL/uL (ref 3.87–5.11)
RDW: 14.5 % (ref 11.5–15.5)
WBC: 8.2 10*3/uL (ref 4.0–10.5)

## 2013-04-05 LAB — BASIC METABOLIC PANEL
BUN: 18 mg/dL (ref 6–23)
CALCIUM: 9.4 mg/dL (ref 8.4–10.5)
CO2: 28 mEq/L (ref 19–32)
Chloride: 98 mEq/L (ref 96–112)
Creatinine, Ser: 0.66 mg/dL (ref 0.50–1.10)
GFR calc Af Amer: >90 mL/min (ref >90)
GFR, EST NON AFRICAN AMERICAN: 87 mL/min — AB (ref >90)
Glucose, Bld: 95 mg/dL (ref 70–99)
Potassium: 4.2 mEq/L (ref 3.7–5.3)
Sodium: 139 mEq/L (ref 137–147)

## 2013-04-05 MED ORDER — CHLORHEXIDINE GLUCONATE 4 % EX LIQD: 1.0000 "application " | Freq: Once | CUTANEOUS | Status: DC

## 2013-04-05 NOTE — Pre-Procedure Instructions (Signed)
Tiffany Hood  04/05/2013   Your procedure is scheduled on:  April 13, 2013  Report to [place]  2 * 3 at 5:30 AM.  Call this number if you have problems the morning of surgery: [phone]   Remember:   Do not eat food or drink liquids after midnight.   Take these medicines the morning of surgery with A SIP OF WATER: (fosamax)alendronate    STOP ASPIRIN, MOBIC(MELOXICAM), FISH OIL, HERBAL MEDICATIONS  1/10   Do not wear jewelry, make-up or nail polish.  Do not wear lotions, powders, or perfumes. You may wear deodorant.  Do not shave 48 hours prior to surgery. Men may shave face and neck.  Do not bring valuables to the hospital.  [facility] is not responsible for any belongings or valuables.               Contacts, dentures or bridgework may not be worn into surgery.  Leave suitcase in the car. After surgery it may be brought to your room.  For patients admitted to the hospital, discharge time is determined by your treatment team.               Patients discharged the day of surgery will not be allowed to drive home.  Name and phone number of your driver:   Special Instructions: Shower using CHG 2 nights before surgery and the night before surgery.  If you shower the day of surgery use CHG.  Use special wash - you have one bottle of CHG for all showers.  You should use approximately 1/3 of the bottle for each shower.   Please read over the following fact sheets that you were given: Pain Booklet, Coughing and Deep Breathing and Surgical Site Infection Prevention

## 2013-04-13 ENCOUNTER — Inpatient Hospital Stay (HOSPITAL_COMMUNITY)
Admission: RE | Admit: 2013-04-13 | Discharge: 2013-04-18 | DRG: 331 | Disposition: A | Discharge status: Home / Self Care | Payer: Medicare Other | Source: Ambulatory Visit | Attending: General Surgery | Admitting: General Surgery

## 2013-04-13 ENCOUNTER — Encounter (HOSPITAL_COMMUNITY): Payer: Medicare Other | Admitting: Anesthesiology

## 2013-04-13 ENCOUNTER — Inpatient Hospital Stay (HOSPITAL_COMMUNITY): Payer: Medicare Other | Admitting: Anesthesiology

## 2013-04-13 ENCOUNTER — Encounter (HOSPITAL_COMMUNITY)
Admission: RE | Disposition: A | Discharge status: Home / Self Care | Payer: Self-pay | Source: Ambulatory Visit | Attending: General Surgery

## 2013-04-13 ENCOUNTER — Encounter (HOSPITAL_COMMUNITY): Payer: Self-pay | Admitting: Anesthesiology

## 2013-04-13 DIAGNOSIS — M810 Age-related osteoporosis without current pathological fracture: Secondary | ICD-10-CM | POA: Diagnosis present

## 2013-04-13 DIAGNOSIS — Z7982 Long term (current) use of aspirin: Secondary | ICD-10-CM

## 2013-04-13 DIAGNOSIS — Z853 Personal history of malignant neoplasm of breast: Secondary | ICD-10-CM

## 2013-04-13 DIAGNOSIS — Z823 Family history of stroke: Secondary | ICD-10-CM

## 2013-04-13 DIAGNOSIS — E785 Hyperlipidemia, unspecified: Secondary | ICD-10-CM | POA: Diagnosis present

## 2013-04-13 DIAGNOSIS — K5730 Diverticulosis of large intestine without perforation or abscess without bleeding: Secondary | ICD-10-CM

## 2013-04-13 DIAGNOSIS — K5732 Diverticulitis of large intestine without perforation or abscess without bleeding: Principal | ICD-10-CM | POA: Diagnosis present

## 2013-04-13 DIAGNOSIS — Z79899 Other long term (current) drug therapy: Secondary | ICD-10-CM

## 2013-04-13 DIAGNOSIS — E119 Type 2 diabetes mellitus without complications: Secondary | ICD-10-CM | POA: Diagnosis present

## 2013-04-13 DIAGNOSIS — Z8249 Family history of ischemic heart disease and other diseases of the circulatory system: Secondary | ICD-10-CM

## 2013-04-13 DIAGNOSIS — I10 Essential (primary) hypertension: Secondary | ICD-10-CM | POA: Diagnosis present

## 2013-04-13 DIAGNOSIS — Z801 Family history of malignant neoplasm of trachea, bronchus and lung: Secondary | ICD-10-CM

## 2013-04-13 HISTORY — DX: Cardiac murmur, unspecified: R01.1

## 2013-04-13 HISTORY — PX: LAPAROSCOPIC SIGMOID COLECTOMY: SHX5928

## 2013-04-13 LAB — GLUCOSE, CAPILLARY
GLUCOSE-CAPILLARY: 129 mg/dL — AB (ref 70–99)
GLUCOSE-CAPILLARY: 185 mg/dL — AB (ref 70–99)

## 2013-04-13 LAB — CBC
HCT: 37.7 % (ref 36.0–46.0)
Hemoglobin: 12.7 g/dL (ref 12.0–15.0)
MCH: 29.1 pg (ref 26.0–34.0)
MCHC: 33.7 g/dL (ref 30.0–36.0)
MCV: 86.5 fL (ref 78.0–100.0)
PLATELETS: 228 10*3/uL (ref 150–400)
RBC: 4.36 MIL/uL (ref 3.87–5.11)
RDW: 14.6 % (ref 11.5–15.5)
WBC: 17.7 10*3/uL — ABNORMAL HIGH (ref 4.0–10.5)

## 2013-04-13 LAB — CREATININE, SERUM
CREATININE: 0.61 mg/dL (ref 0.50–1.10)
GFR calc Af Amer: >90 mL/min (ref >90)
GFR calc non Af Amer: 89 mL/min — ABNORMAL LOW (ref >90)

## 2013-04-13 SURGERY — COLECTOMY, SIGMOID, LAPAROSCOPIC
Anesthesia: General

## 2013-04-13 MED ORDER — OXYCODONE HCL 5 MG PO TABS: 5.0000 mg | ORAL_TABLET | Freq: Once | ORAL | Status: DC | PRN

## 2013-04-13 MED ORDER — KCL IN DEXTROSE-NACL 20-5-0.9 MEQ/L-%-% IV SOLN
INTRAVENOUS | Status: DC
  Administered 2013-04-13 – 2013-04-14 (×2): via INTRAVENOUS
  Filled 2013-04-13 (×4): qty 1000

## 2013-04-13 MED ORDER — ONDANSETRON HCL 4 MG/2ML IJ SOLN: 4.0000 mg | Freq: Four times a day (QID) | INTRAMUSCULAR | Status: DC | PRN

## 2013-04-13 MED ORDER — SODIUM CHLORIDE 0.9 % IV SOLN
Freq: Once | INTRAVENOUS | Status: DC
  Filled 2013-04-13: qty 1

## 2013-04-13 MED ORDER — INSULIN ASPART 100 UNIT/ML ~~LOC~~ SOLN
0.0000 [IU] | Freq: Four times a day (QID) | SUBCUTANEOUS | Status: DC
  Administered 2013-04-13 – 2013-04-16 (×4): 2 [IU] via SUBCUTANEOUS
  Administered 2013-04-17 – 2013-04-18 (×3): 1 [IU] via SUBCUTANEOUS

## 2013-04-13 MED ORDER — PROPOFOL 10 MG/ML IV BOLUS
INTRAVENOUS | Status: DC | PRN
  Administered 2013-04-13: 120 mg via INTRAVENOUS

## 2013-04-13 MED ORDER — FENTANYL CITRATE 0.05 MG/ML IJ SOLN
INTRAMUSCULAR | Status: DC | PRN
Start: 2013-04-13 — End: 2013-04-13
  Administered 2013-04-13 (×3): 100 ug via INTRAVENOUS
  Administered 2013-04-13 (×4): 50 ug via INTRAVENOUS

## 2013-04-13 MED ORDER — DEXTROSE 5 % IV SOLN
INTRAVENOUS | Status: AC
  Filled 2013-04-13 (×2): qty 1

## 2013-04-13 MED ORDER — ONDANSETRON HCL 4 MG PO TABS: 4.0000 mg | ORAL_TABLET | Freq: Four times a day (QID) | ORAL | Status: DC | PRN

## 2013-04-13 MED ORDER — ROCURONIUM BROMIDE 100 MG/10ML IV SOLN
INTRAVENOUS | Status: DC | PRN
  Administered 2013-04-13: 50 mg via INTRAVENOUS

## 2013-04-13 MED ORDER — MORPHINE SULFATE 4 MG/ML IJ SOLN
4.0000 mg | INTRAMUSCULAR | Status: DC | PRN
  Administered 2013-04-13 – 2013-04-16 (×13): 4 mg via INTRAVENOUS
  Filled 2013-04-13 (×13): qty 1

## 2013-04-13 MED ORDER — MIDAZOLAM HCL 5 MG/5ML IJ SOLN
INTRAMUSCULAR | Status: DC | PRN
  Administered 2013-04-13: 2 mg via INTRAVENOUS

## 2013-04-13 MED ORDER — SODIUM CHLORIDE 0.9 % IR SOLN
Status: DC | PRN
  Administered 2013-04-13 (×2): 2000 mL

## 2013-04-13 MED ORDER — GLYCOPYRROLATE 0.2 MG/ML IJ SOLN
INTRAMUSCULAR | Status: DC | PRN
  Administered 2013-04-13: 0.6 mg via INTRAVENOUS

## 2013-04-13 MED ORDER — INSULIN ASPART 100 UNIT/ML ~~LOC~~ SOLN: 0.0000 [IU] | SUBCUTANEOUS | Status: DC

## 2013-04-13 MED ORDER — LACTATED RINGERS IV SOLN
INTRAVENOUS | Status: DC | PRN
  Administered 2013-04-13 (×3): via INTRAVENOUS

## 2013-04-13 MED ORDER — ONDANSETRON HCL 4 MG/2ML IJ SOLN
INTRAMUSCULAR | Status: DC | PRN
  Administered 2013-04-13: 4 mg via INTRAVENOUS

## 2013-04-13 MED ORDER — HYDROMORPHONE HCL PF 1 MG/ML IJ SOLN
INTRAMUSCULAR | Status: AC
  Administered 2013-04-13: 0.25 mg via INTRAVENOUS
  Filled 2013-04-13: qty 1

## 2013-04-13 MED ORDER — HYDROMORPHONE HCL PF 1 MG/ML IJ SOLN
0.2500 mg | INTRAMUSCULAR | Status: DC | PRN
  Administered 2013-04-13: 0.25 mg via INTRAVENOUS

## 2013-04-13 MED ORDER — LIDOCAINE HCL (CARDIAC) 20 MG/ML IV SOLN
INTRAVENOUS | Status: DC | PRN
  Administered 2013-04-13 (×3): 10 mg via INTRAVENOUS
  Administered 2013-04-13: 50 mg via INTRAVENOUS

## 2013-04-13 MED ORDER — HEPARIN SODIUM (PORCINE) 5000 UNIT/ML IJ SOLN
5000.0000 [IU] | Freq: Three times a day (TID) | INTRAMUSCULAR | Status: DC
  Administered 2013-04-14 – 2013-04-15 (×4): 5000 [IU] via SUBCUTANEOUS
  Filled 2013-04-13 (×8): qty 1

## 2013-04-13 MED ORDER — PROMETHAZINE HCL 25 MG/ML IJ SOLN: 6.2500 mg | INTRAMUSCULAR | Status: DC | PRN

## 2013-04-13 MED ORDER — DEXTROSE 5 % IV SOLN
1.0000 g | INTRAVENOUS | Status: DC | PRN
  Administered 2013-04-13: 2 g via INTRAVENOUS

## 2013-04-13 MED ORDER — OXYCODONE HCL 5 MG/5ML PO SOLN: 5.0000 mg | Freq: Once | ORAL | Status: DC | PRN

## 2013-04-13 MED ORDER — BUPIVACAINE-EPINEPHRINE (PF) 0.25% -1:200000 IJ SOLN
INTRAMUSCULAR | Status: AC
  Filled 2013-04-13: qty 30

## 2013-04-13 MED ORDER — DIPHENHYDRAMINE HCL 50 MG/ML IJ SOLN
INTRAMUSCULAR | Status: DC | PRN
  Administered 2013-04-13: 12.5 mg via INTRAVENOUS

## 2013-04-13 MED ORDER — NEOSTIGMINE METHYLSULFATE 1 MG/ML IJ SOLN
INTRAMUSCULAR | Status: DC | PRN
  Administered 2013-04-13: 5 mg via INTRAVENOUS

## 2013-04-13 MED ORDER — BUPIVACAINE-EPINEPHRINE 0.25% -1:200000 IJ SOLN
INTRAMUSCULAR | Status: DC | PRN
  Administered 2013-04-13: 14 mL

## 2013-04-13 MED ORDER — POVIDONE-IODINE 10 % EX OINT
TOPICAL_OINTMENT | CUTANEOUS | Status: AC
  Filled 2013-04-13: qty 28.35

## 2013-04-13 SURGICAL SUPPLY — 82 items
APPLIER CLIP ROT 10 11.4 M/L (STAPLE)
APR CLP MED LRG 11.4X10 (STAPLE)
BLADE SURG 10 STRL SS (BLADE) ×2 IMPLANT
BLADE SURG ROTATE 9660 (MISCELLANEOUS) ×1 IMPLANT
CANISTER SUCTION 2500CC (MISCELLANEOUS) ×2 IMPLANT
CELLS DAT CNTRL 66122 CELL SVR (MISCELLANEOUS) IMPLANT
CHLORAPREP W/TINT 26ML (MISCELLANEOUS) ×2 IMPLANT
CLIP APPLIE ROT 10 11.4 M/L (STAPLE) IMPLANT
COVER MAYO STAND STRL (DRAPES) ×4 IMPLANT
COVER SURGICAL LIGHT HANDLE (MISCELLANEOUS) ×2 IMPLANT
DECANTER SPIKE VIAL GLASS SM (MISCELLANEOUS) ×2 IMPLANT
DRAPE PROXIMA HALF (DRAPES) ×4 IMPLANT
DRAPE UTILITY 15X26 W/TAPE STR (DRAPE) ×10 IMPLANT
DRAPE WARM FLUID 44X44 (DRAPE) ×2 IMPLANT
DRSG OPSITE POSTOP 4X10 (GAUZE/BANDAGES/DRESSINGS) IMPLANT
DRSG OPSITE POSTOP 4X8 (GAUZE/BANDAGES/DRESSINGS) ×1 IMPLANT
DRSG TEGADERM 2-3/8X2-3/4 SM (GAUZE/BANDAGES/DRESSINGS) ×3 IMPLANT
ELECT BLADE 6.5 EXT (BLADE) ×2 IMPLANT
ELECT CAUTERY BLADE 6.4 (BLADE) ×4 IMPLANT
ELECT REM PT RETURN 9FT ADLT (ELECTROSURGICAL) ×2
ELECTRODE REM PT RTRN 9FT ADLT (ELECTROSURGICAL) ×1 IMPLANT
GAUZE SPONGE 2X2 8PLY NS (GAUZE/BANDAGES/DRESSINGS) ×1 IMPLANT
GEL ULTRASOUND 20GR AQUASONIC (MISCELLANEOUS) IMPLANT
GLOVE BIO SURGEON STRL SZ7.5 (GLOVE) ×5 IMPLANT
GLOVE BIOGEL PI IND STRL 6.5 (GLOVE) IMPLANT
GLOVE BIOGEL PI IND STRL 7.0 (GLOVE) IMPLANT
GLOVE BIOGEL PI IND STRL 8 (GLOVE) IMPLANT
GLOVE BIOGEL PI INDICATOR 6.5 (GLOVE) ×2
GLOVE BIOGEL PI INDICATOR 7.0 (GLOVE) ×4
GLOVE BIOGEL PI INDICATOR 8 (GLOVE) ×1
GLOVE SURG SS PI 7.0 STRL IVOR (GLOVE) ×3 IMPLANT
GOWN SPEC L3 MED W/TWL (GOWN DISPOSABLE) ×3 IMPLANT
GOWN STRL NON-REIN LRG LVL3 (GOWN DISPOSABLE) ×14 IMPLANT
KIT BASIN OR (CUSTOM PROCEDURE TRAY) ×2 IMPLANT
KIT ROOM TURNOVER OR (KITS) ×2 IMPLANT
LEGGING LITHOTOMY PAIR STRL (DRAPES) ×2 IMPLANT
LIGASURE IMPACT 36 18CM CVD LR (INSTRUMENTS) ×1 IMPLANT
NS IRRIG 1000ML POUR BTL (IV SOLUTION) ×4 IMPLANT
PAD ARMBOARD 7.5X6 YLW CONV (MISCELLANEOUS) ×4 IMPLANT
PENCIL BUTTON HOLSTER BLD 10FT (ELECTRODE) ×4 IMPLANT
RETRACTOR WND ALEXIS 18 MED (MISCELLANEOUS) IMPLANT
RTRCTR WOUND ALEXIS 18CM MED (MISCELLANEOUS)
SCALPEL HARMONIC ACE (MISCELLANEOUS) ×2 IMPLANT
SCISSORS LAP 5X35 DISP (ENDOMECHANICALS) ×1 IMPLANT
SET IRRIG TUBING LAPAROSCOPIC (IRRIGATION / IRRIGATOR) IMPLANT
SLEEVE ENDOPATH XCEL 5M (ENDOMECHANICALS) ×4 IMPLANT
SPECIMEN JAR LARGE (MISCELLANEOUS) ×2 IMPLANT
SPONGE LAP 18X18 X RAY DECT (DISPOSABLE) ×2 IMPLANT
STAPLER CIRC CVD 29MM 37CM (STAPLE) ×1 IMPLANT
STAPLER CIRC ILS CVD 33MM 37CM (STAPLE) ×1 IMPLANT
STAPLER CUT CVD 40MM BLUE (STAPLE) ×1 IMPLANT
STAPLER CUT RELOAD BLUE (STAPLE) ×1 IMPLANT
STAPLER PROXIMATE 75MM BLUE (STAPLE) ×2 IMPLANT
STAPLER VISISTAT 35W (STAPLE) ×3 IMPLANT
SURGILUBE 2OZ TUBE FLIPTOP (MISCELLANEOUS) ×3 IMPLANT
SUT PDS AB 1 CT  36 (SUTURE) ×2
SUT PDS AB 1 CT 36 (SUTURE) ×2 IMPLANT
SUT PDS AB 1 TP1 96 (SUTURE) ×2 IMPLANT
SUT PROLENE 2 0 CT2 30 (SUTURE) IMPLANT
SUT PROLENE 2 0 KS (SUTURE) IMPLANT
SUT PROLENE 2 0 SH DA (SUTURE) ×2 IMPLANT
SUT SILK 2 0 (SUTURE) ×2
SUT SILK 2 0 SH CR/8 (SUTURE) ×2 IMPLANT
SUT SILK 2-0 18XBRD TIE 12 (SUTURE) ×1 IMPLANT
SUT SILK 3 0 (SUTURE) ×2
SUT SILK 3 0 SH CR/8 (SUTURE) ×2 IMPLANT
SUT SILK 3-0 18XBRD TIE 12 (SUTURE) ×1 IMPLANT
SYR BULB IRRIGATION 50ML (SYRINGE) ×3 IMPLANT
SYS LAPSCP GELPORT 120MM (MISCELLANEOUS)
SYSTEM LAPSCP GELPORT 120MM (MISCELLANEOUS) IMPLANT
TOWEL OR 17X26 10 PK STRL BLUE (TOWEL DISPOSABLE) ×4 IMPLANT
TRAY FOLEY CATH 14FRSI W/METER (CATHETERS) ×1 IMPLANT
TRAY FOLEY CATH 16FRSI W/METER (SET/KITS/TRAYS/PACK) ×1 IMPLANT
TRAY LAPAROSCOPIC (CUSTOM PROCEDURE TRAY) ×2 IMPLANT
TRAY PROCTOSCOPIC FIBER OPTIC (SET/KITS/TRAYS/PACK) ×3 IMPLANT
TROCAR XCEL 12X100 BLDLESS (ENDOMECHANICALS) IMPLANT
TROCAR XCEL BLUNT TIP 100MML (ENDOMECHANICALS) ×1 IMPLANT
TROCAR XCEL NON-BLD 11X100MML (ENDOMECHANICALS) IMPLANT
TROCAR XCEL NON-BLD 5MMX100MML (ENDOMECHANICALS) ×2 IMPLANT
TUBE CONNECTING 12X1/4 (SUCTIONS) ×4 IMPLANT
WATER STERILE IRR 1000ML POUR (IV SOLUTION) ×2 IMPLANT
YANKAUER SUCT BULB TIP NO VENT (SUCTIONS) ×4 IMPLANT

## 2013-04-13 NOTE — Anesthesia Procedure Notes (Signed)
Procedure Name: Intubation Date/Time: 04/13/2013 7:55 AM Performed by: Maude Leriche D Pre-anesthesia Checklist: Patient identified, Emergency Drugs available, Suction available, Patient being monitored and Timeout performed Patient Re-evaluated:Patient Re-evaluated prior to inductionOxygen Delivery Method: Circle system utilized Preoxygenation: Pre-oxygenation with 100% oxygen Intubation Type: IV induction Ventilation: Mask ventilation without difficulty Laryngoscope Size: Miller and 2 Grade View: Grade I Tube type: Oral Tube size: 7.5 mm Number of attempts: 1 Airway Equipment and Method: Stylet Placement Confirmation: ETT inserted through vocal cords under direct vision,  positive ETCO2 and breath sounds checked- equal and bilateral Secured at: 22 cm Tube secured with: Tape Dental Injury: Teeth and Oropharynx as per pre-operative assessment

## 2013-04-13 NOTE — Progress Notes (Signed)
Report given to Angel RN.

## 2013-04-13 NOTE — Transfer of Care (Signed)
Immediate Anesthesia Transfer of Care Note  Patient: Tiffany Hood  Procedure(s) Performed: Procedure(s): LAPAROSCOPIC ASSISTED SIGMOID COLECTOMY (N/A)  Patient Location: PACU  Anesthesia Type:General  Level of Consciousness: sedated  Airway & Oxygen Therapy: Patient Spontanous Breathing and Patient connected to face mask oxygen  Post-op Assessment: Report given to PACU RN and Post -op Vital signs reviewed and stable  Post vital signs: Reviewed and stable  Complications: No apparent anesthesia complications

## 2013-04-13 NOTE — Anesthesia Postprocedure Evaluation (Signed)
  Anesthesia Post-op Note  Patient: Tiffany Hood  Procedure(s) Performed: Procedure(s): LAPAROSCOPIC ASSISTED SIGMOID COLECTOMY (N/A)  Patient Location: PACU  Anesthesia Type:General  Level of Consciousness: awake and alert   Airway and Oxygen Therapy: Patient Spontanous Breathing  Post-op Pain: mild  Post-op Assessment: Post-op Vital signs reviewed  Post-op Vital Signs: stable  Complications: No apparent anesthesia complications

## 2013-04-13 NOTE — Anesthesia Preprocedure Evaluation (Addendum)
Anesthesia Evaluation  Patient identified by MRN, date of birth, ID band Patient awake    Reviewed: Allergy & Precautions, H&P , NPO status , Patient's Chart, lab work & pertinent test results  History of Anesthesia Complications Negative for: history of anesthetic complications  Airway Mallampati: I  Neck ROM: Full    Dental  (+) Teeth Intact and Dental Advisory Given   Pulmonary neg pulmonary ROS,  breath sounds clear to auscultation        Cardiovascular hypertension, Pt. on medications Rhythm:Regular Rate:Normal     Neuro/Psych    GI/Hepatic   Endo/Other  diabetes  Renal/GU      Musculoskeletal   Abdominal   Peds  Hematology   Anesthesia Other Findings   Reproductive/Obstetrics                          Anesthesia Physical Anesthesia Plan  ASA: II  Anesthesia Plan: General   Post-op Pain Management:    Induction: Intravenous  Airway Management Planned: Oral ETT  Additional Equipment:   Intra-op Plan:   Post-operative Plan: Extubation in OR  Informed Consent: I have reviewed the patients History and Physical, chart, labs and discussed the procedure including the risks, benefits and alternatives for the proposed anesthesia with the patient or authorized representative who has indicated his/her understanding and acceptance.   Dental advisory given  Plan Discussed with: CRNA, Surgeon and Anesthesiologist  Anesthesia Plan Comments:        Anesthesia Quick Evaluation

## 2013-04-13 NOTE — H&P (Signed)
Tiffany Hood  11/30/2012 2:20 PM   Office Visit  MRN:  [id number]   Description: 73 year old female  Provider: Merrie Roof, MD  Department: [facility]          Diagnoses      Diverticulitis of sigmoid colon    -  Primary      562.11             Reason for Visit      Follow-up      1 mo recheck diverticulitis               Current Vitals - Last Recorded      BP Pulse Resp Ht Wt BMI      130/70 74 18 5' 2.5" (1.588 m) 142 lb (64.411 kg) 25.54 kg/m2              Progress Notes      Merrie Roof, MD at 01/08/2013  4:58 PM      Status: Signed            Patient ID: Tiffany Hood, female   DOB: [birth year], 73 y.o.   MRN: [id number]    Chief Complaint   Patient presents with   .  Follow-up       1 mo recheck diverticulitis        HPI Tiffany Hood is a 73 y.o. female.  The patient is a 73 year old white female who we have been following for sigmoid diverticulitis with abscess. Although she has improved on antibiotics her abscess cavity has never gone away. More recently she has been complaining of an off and on vaginal discharge. She describes it as a brownish discharge. She denies any Odor to the discharge. She denies any fevers or chills. She continues to have some left lower quadrant pain. HPI    Past Medical History   Diagnosis  Date   .  Hypertension     .  Hyperlipidemia     .  Osteoporosis         on Arimidex   .  Diabetes mellitus     .  Breast cancer  left   .  Diverticulitis         09/14/12   .  Diverticulosis     .  Personal history of colonic adenomas  11/15/2012         Past Surgical History   Procedure  Laterality  Date   .  Mastectomy partial / lumpectomy w/ axillary lymphadenectomy  Left  05/07/2002       with SLN; 35 treatments of radiation   .  Abdominal hysterectomy    1965       ovaries remain; for dysfunctional menses; ? endometriosis   .  Tonsillectomy and adenoidectomy       .  Colonoscopy    2004        Tics   .   Removal bone spur  Left  2/14       2nd finger         Family History   Problem  Relation  Age of Onset   .  Stroke  Mother  66   .  Hypertension  Mother     .  Lung cancer  Father     .  Hypertension  Sister     .  Hypertension  Brother     .  Stroke  Brother  27   .  Heart disease  Brother  3352   .  Diabetes  Neg Hx     .  Colon cancer  Neg Hx          Social History History   Substance Use Topics   .  Smoking status:  Never Smoker    .  Smokeless tobacco:  Never Used   .  Alcohol Use:  No        No Known Allergies    Current Outpatient Prescriptions   Medication  Sig  Dispense  Refill   .  alendronate (FOSAMAX) 70 MG tablet  TAKE ONE TABLET BY MOUTH ONCE A WEEK WITH  A  FULL  GLASS  OF  WATER  ON  AN  EMPTY  STOMACH   12 tablet   PRN   .  aspirin 81 MG tablet  Take 81 mg by mouth. M/W/F          .  Calcium Carbonate-Vitamin D (CALTRATE 600+D PO)  Take 1 capsule by mouth daily.          .  folic acid (FOLVITE) 1 MG tablet  TAKE ONE TABLET BY MOUTH EVERY DAY   100 tablet   1   .  Lancets (ONETOUCH ULTRASOFT) lancets  USE AS DIRECTED ONCE DAILY   100 each   3   .  losartan (COZAAR) 50 MG tablet  TAKE ONE TABLET BY MOUTH EVERY DAY   90 tablet   1   .  meloxicam (MOBIC) 15 MG tablet  Take 15 mg by mouth daily.         .  Multiple Vitamin (MULTIVITAMIN) tablet  Take 1 tablet by mouth daily.           .  Omega-3 Fatty Acids (FISH OIL) 1000 MG CAPS  Take by mouth daily.           .  ONE TOUCH ULTRA TEST test strip  USE AS DIRECTED ONCE DAILY   100 each   3   .  pravastatin (PRAVACHOL) 40 MG tablet  TAKE ONE TABLET BY MOUTH EVERY DAY   90 tablet   0   .  triamterene-hydrochlorothiazide (MAXZIDE) 75-50 MG per tablet  TAKE ONE-HALF TABLET BY MOUTH EVERY DAY   90 tablet   0   .  vitamin E 400 UNIT capsule  Take 400 Units by mouth daily.               No current facility-administered medications for this visit.        Review of Systems Review of Systems   Constitutional: Negative.   HENT: Negative.   Eyes: Negative.   Respiratory: Negative.   Cardiovascular: Negative.   Gastrointestinal: Positive for abdominal pain.  Endocrine: Negative.   Genitourinary: Positive for vaginal discharge.  Musculoskeletal: Negative.   Skin: Negative.   Allergic/Immunologic: Negative.   Neurological: Negative.   Hematological: Negative.   Psychiatric/Behavioral: Negative.       Blood pressure 130/70, pulse 74, resp. rate 18, height 5' 2.5" (1.588 m), weight 142 lb (64.411 kg).   Physical Exam Physical Exam  Constitutional: She is oriented to person, place, and time. She appears well-developed and well-nourished.  HENT:   Head: Normocephalic and atraumatic.  Eyes: Conjunctivae and EOM are normal. Pupils are equal, round, and reactive to light.  Neck: Normal range of motion. Neck supple.  Cardiovascular: Normal rate, regular rhythm and normal heart sounds.   Pulmonary/Chest: Effort normal and breath sounds  normal.  Abdominal: Soft. Bowel sounds are normal.  There is still some left lower quadrant tenderness but no guarding or peritonitis  Musculoskeletal: Normal range of motion.  Neurological: She is alert and oriented to person, place, and time.  Skin: Skin is warm and dry.  Psychiatric: She has a normal mood and affect. Her behavior is normal.      Data Reviewed As above   Assessment    The patient has a complicated diverticulitis with abscess that has not fully resolved with antibiotics. Because of this I think she has a high chance of recurrence and would probably benefit from an elective sigmoid colectomy. We will also plan to obtain a barium enema to look for any sign of colovaginal fistula. I've discussed with her in detail the risks and benefits of the operation to remove the diseased segment of her sigmoid colon including the risk of leak as well as some of the technical aspects of the surgery and she understands and wishes to  proceed      Plan    Plan for laparoscopic assisted sigmoid colectomy

## 2013-04-13 NOTE — Progress Notes (Signed)
Arrived to 6n4 from PACU, sleepy but easily arousable, family at bedside, denies nausea/pain at this time, oriented to room and surroundings.

## 2013-04-13 NOTE — Op Note (Addendum)
04/13/2013  10:48 AM  PATIENT:  Tiffany Hood  73 y.o. female  PRE-OPERATIVE DIAGNOSIS:  sigmoid diverticultis  POST-OPERATIVE DIAGNOSIS:  sigmoid diverticultis  PROCEDURE:  Procedure(s): LAPAROSCOPIC ASSISTED SIGMOID COLECTOMY (N/A) with mobilization of splenic flexure  SURGEON:  Surgeon(s) and Role:    * Merrie Roof, MD - Primary    * Ralene Ok, MD - Assisting  PHYSICIAN ASSISTANT:   ASSISTANTS: Dr. Rosendo Gros   ANESTHESIA:   general  EBL:  Total I/O In: 2000 [I.V.:2000] Out: 300 [Urine:100; Blood:200]  BLOOD ADMINISTERED:none  DRAINS: none   LOCAL MEDICATIONS USED:  MARCAINE     SPECIMEN:  Source of Specimen:  sigmoid colon  DISPOSITION OF SPECIMEN:  PATHOLOGY  COUNTS:  YES  TOURNIQUET:  * No tourniquets in log *  DICTATION: .Dragon Dictation After informed consent was obtained patient brought to the operating room and placed in the supine position on the operating table. After adequate induction of general anesthesia the patient was placed in lithotomy position and all pressure points were padded. Her abdomen was prepped with ChloraPrep, allowed to dry, and draped in usual sterile manner and her perineum was prepped with Betadine. Initially a 5 mm Optiview port was used to access the abdominal cavity on the right side of the abdomen under direct vision. The abdomen was then insufflated with carbon dioxide without difficulty. 2 other 5 mm ports were placed under direct vision on the right side of the abdomen. The left colon all the way to through splenic flexure was mobilized by incising its retroperitoneal attachment along the white line of Toldt. The sigmoid colon was densely inflamed to the pelvic sidewall and because of this we decided to open through a lower midline incision. This incision was made with a 10 blade knife. The incision was carried to the skin and subcutaneous tissue sharply with electrocautery the linea alba was identified. The linea alba was  also incised with the electrocautery. The preperitoneal space was probed with a finger into the peritoneum was opened and access was gained to the abdominal cavity. The rest of the incision was opened under direct vision. A Balfour retractor was deployed. The small intestine was packed into the upper abdomen. The sigmoid colon was densely adherent to the pelvic sidewall but we were able to dissect into this by blunt finger dissection. Some of this inflammatory change was densely adherent to the left edge of the vaginal stump. This was taken down sharply with the electrocautery. An obvious fistula was not able to be demonstrated. We did identify the left ureter and it was well down away from where we were dissecting. We then chose a site on the left colon where the colon appeared normal and open the mesentery at this point bluntly with a Kelly clamp. A GIA-75 stapler was placed across the colon at this point clamped and fired the bowel divided the colon between staple lines. The mesentery to the sigmoid colon was taken down sharply with the LigaSure. A site was chosen below the area of disease on the rectum where the rectum appeared healthy. The mesentery at this point was opened as the mesenteric dissection was brought up to the rectal wall. A blue load contour stapler was placed across the rectum at this point clamped and fired by dividing the rectum between staple lines. We then tried to perform a rigid sigmoidoscopy to see if we could get to the staple line from below and the rectum at this point was tethered to the  retroperitoneum and we could not drive up to it very well. We then chose to remove and asked her to edges or so of rectum so that the anastomosis would be a straight shot without chance of distal obstruction. We removed this rectum by dissecting the mesentery sharply with the LigaSure and the  Distal rectum was then stapled again with a blue load contour. The staple line seemed to be in much better  position. The staple line on the rocks small colon was then removed sharply with the electrocautery. A 2-0 Prolene pursestring stitch was placed around the edge of the colon. The colon was sized with sizers and we chose a 29 mm stapler. The anvil was placed in the proximal colon and the purse string stitch was cinched down and tied. We then brought the 29 mm stapling device up through the anus and into the rectal stump. The spike was deployed on the anterior rectal wall in good position. The anvil was then connected to the spike and the device was closed into the green zone. The stapler was then clamped and fired thereby creating a nice colorectal anastomosis. The staple line was reinforced with several 2-0 silk stitches. The stapling device was gently removed without difficulty. Next with a clamp on the proximal colon and the pelvis filled with saline we performed a rigid sigmoidoscopy and insufflated the colon and rectum. There was no bubbling in the saline. The insufflation was then removed and the abdomen was irrigated with copious amounts of saline. The sponges and retractors were removed. All gown and glove and drapes were replaced with clean. The abdomen was then irrigated again with copious amounts saline. The abdomen was generally inspected and no other abnormalities were noted. The fascia of the anterior abdominal wall was then closed with 2 running #1 double-stranded looped PDS sutures. The subcutaneous tissue was irrigated with copious amounts of saline and Betadine. The skin and port sites were then closed with staples. Sterile dressings were applied. The patient tolerated the procedure well. At the end of the case all needle sponge and instrument counts were correct. The patient was then awakened and taken to recovery in stable condition.  PLAN OF CARE: Admit to inpatient   PATIENT DISPOSITION:  PACU - hemodynamically stable.   Delay start of Pharmacological VTE agent (>24hrs) due to surgical  blood loss or risk of bleeding: no

## 2013-04-14 LAB — CBC
HCT: 35.8 % — ABNORMAL LOW (ref 36.0–46.0)
Hemoglobin: 11.7 g/dL — ABNORMAL LOW (ref 12.0–15.0)
MCH: 28.7 pg (ref 26.0–34.0)
MCHC: 32.7 g/dL (ref 30.0–36.0)
MCV: 87.7 fL (ref 78.0–100.0)
PLATELETS: 223 10*3/uL (ref 150–400)
RBC: 4.08 MIL/uL (ref 3.87–5.11)
RDW: 15.3 % (ref 11.5–15.5)
WBC: 15.7 10*3/uL — ABNORMAL HIGH (ref 4.0–10.5)

## 2013-04-14 LAB — BASIC METABOLIC PANEL
BUN: 16 mg/dL (ref 6–23)
CALCIUM: 7.9 mg/dL — AB (ref 8.4–10.5)
CO2: 24 mEq/L (ref 19–32)
CREATININE: 0.59 mg/dL (ref 0.50–1.10)
Chloride: 106 mEq/L (ref 96–112)
GFR calc Af Amer: >90 mL/min (ref >90)
GFR calc non Af Amer: >90 mL/min (ref >90)
Glucose, Bld: 147 mg/dL — ABNORMAL HIGH (ref 70–99)
Potassium: 3.7 mEq/L (ref 3.7–5.3)
Sodium: 141 mEq/L (ref 137–147)

## 2013-04-14 LAB — GLUCOSE, CAPILLARY
GLUCOSE-CAPILLARY: 164 mg/dL — AB (ref 70–99)
Glucose-Capillary: 105 mg/dL — ABNORMAL HIGH (ref 70–99)
Glucose-Capillary: 109 mg/dL — ABNORMAL HIGH (ref 70–99)
Glucose-Capillary: 112 mg/dL — ABNORMAL HIGH (ref 70–99)
Glucose-Capillary: 149 mg/dL — ABNORMAL HIGH (ref 70–99)

## 2013-04-14 MED ORDER — POTASSIUM CHLORIDE IN NACL 20-0.9 MEQ/L-% IV SOLN
INTRAVENOUS | Status: DC
Start: 2013-04-14 — End: 2013-04-18
  Administered 2013-04-14 – 2013-04-17 (×6): via INTRAVENOUS
  Filled 2013-04-14 (×10): qty 1000

## 2013-04-14 NOTE — Progress Notes (Signed)
1 Day Post-Op  Subjective: Complains of soreness but seems to be doing ok  Objective: Vital signs in last 24 hours: Temp:  [97.2 F (36.2 C)-98.1 F (36.7 C)] 97.9 F (36.6 C) (01/17 0300) Pulse Rate:  [61-106] 99 (01/17 0300) Resp:  [10-20] 20 (01/17 0300) BP: (147-178)/(60-82) 164/65 mmHg (01/17 0300) SpO2:  [97 %-100 %] 97 % (01/17 0300) Weight:  [148 lb (67.132 kg)] 148 lb (67.132 kg) (01/16 1700) Last BM Date: 04/12/13  Intake/Output from previous day: 01/16 0701 - 01/17 0700 In: 3493.3 [I.V.:3493.3] Out: 1230 [Urine:800; Emesis/NG output:230; Blood:200] Intake/Output this shift:    Resp: clear to auscultation bilaterally Cardio: regular rate and rhythm GI: soft, appropriately tender. few bs. incision looks good  Lab Results:   Recent Labs  04/13/13 1500 04/14/13 0337  WBC 17.7* 15.7*  HGB 12.7 11.7*  HCT 37.7 35.8*  PLT 228 223   BMET  Recent Labs  04/13/13 1500 04/14/13 0337  NA  --  141  K  --  3.7  CL  --  106  CO2  --  24  GLUCOSE  --  147*  BUN  --  16  CREATININE 0.61 0.59  CALCIUM  --  7.9*   PT/INR No results found for this basename: LABPROT, INR,  in the last 72 hours ABG No results found for this basename: PHART, PCO2, PO2, HCO3,  in the last 72 hours  Studies/Results: No results found.  Anti-infectives: Anti-infectives   Start     Dose/Rate Route Frequency Ordered Stop   04/13/13 0945  bacitracin 50,000 Units, gentamicin (GARAMYCIN) 80 mg, ceFAZolin (ANCEF) 1 g in sodium chloride 0.9 % 1,000 mL  Status:  Discontinued      Irrigation Once 04/13/13 0935 04/13/13 0950   04/13/13 0720  dextrose 5 % with cefOXitin (MEFOXIN) ADS Med    Comments:  Maude Leriche   : cabinet override      04/13/13 0720 04/13/13 1929      Assessment/Plan: s/p Procedure(s): LAPAROSCOPIC ASSISTED SIGMOID COLECTOMY (N/A) Continue ng and bowel rest OOB to chair Will remove D5 from IVF for increased blood sugars  LOS: 1 day    TOTH III,PAUL  S 04/14/2013

## 2013-04-15 LAB — BASIC METABOLIC PANEL
BUN: 14 mg/dL (ref 6–23)
CALCIUM: 7.7 mg/dL — AB (ref 8.4–10.5)
CO2: 25 mEq/L (ref 19–32)
Chloride: 109 mEq/L (ref 96–112)
Creatinine, Ser: 0.53 mg/dL (ref 0.50–1.10)
GFR calc Af Amer: >90 mL/min (ref >90)
GFR calc non Af Amer: >90 mL/min (ref >90)
GLUCOSE: 103 mg/dL — AB (ref 70–99)
Potassium: 3.7 mEq/L (ref 3.7–5.3)
Sodium: 147 mEq/L (ref 137–147)

## 2013-04-15 LAB — TYPE AND SCREEN
ABO/RH(D): O POS
ANTIBODY SCREEN: NEGATIVE

## 2013-04-15 LAB — GLUCOSE, CAPILLARY
Glucose-Capillary: 110 mg/dL — ABNORMAL HIGH (ref 70–99)
Glucose-Capillary: 95 mg/dL (ref 70–99)
Glucose-Capillary: 92 mg/dL (ref 70–99)

## 2013-04-15 LAB — CBC
HEMATOCRIT: 30.6 % — AB (ref 36.0–46.0)
HEMOGLOBIN: 9.8 g/dL — AB (ref 12.0–15.0)
MCH: 28.6 pg (ref 26.0–34.0)
MCHC: 32 g/dL (ref 30.0–36.0)
MCV: 89.2 fL (ref 78.0–100.0)
Platelets: 193 10*3/uL (ref 150–400)
RBC: 3.43 MIL/uL — AB (ref 3.87–5.11)
RDW: 16 % — ABNORMAL HIGH (ref 11.5–15.5)
WBC: 12.7 10*3/uL — ABNORMAL HIGH (ref 4.0–10.5)

## 2013-04-15 LAB — ABO/RH: ABO/RH(D): O POS

## 2013-04-15 MED ORDER — CHLORHEXIDINE GLUCONATE 0.12 % MT SOLN
15.0000 mL | Freq: Two times a day (BID) | OROMUCOSAL | Status: DC
  Administered 2013-04-15 – 2013-04-16 (×3): 15 mL via OROMUCOSAL
  Filled 2013-04-15 (×4): qty 15

## 2013-04-15 MED ORDER — BIOTENE DRY MOUTH MT LIQD
15.0000 mL | Freq: Two times a day (BID) | OROMUCOSAL | Status: DC
  Administered 2013-04-15 – 2013-04-16 (×4): 15 mL via OROMUCOSAL

## 2013-04-15 MED ORDER — PANTOPRAZOLE SODIUM 40 MG IV SOLR
40.0000 mg | Freq: Two times a day (BID) | INTRAVENOUS | Status: DC
  Administered 2013-04-15 – 2013-04-16 (×4): 40 mg via INTRAVENOUS
  Filled 2013-04-15 (×8): qty 40

## 2013-04-15 NOTE — Progress Notes (Signed)
2 Days Post-Op  Subjective: Still having soreness when she moves. Ng output has turned bloody overnight  Objective: Vital signs in last 24 hours: Temp:  [98 F (36.7 C)-99 F (37.2 C)] 98 F (36.7 C) (01/18 0506) Pulse Rate:  [90-95] 90 (01/18 0506) Resp:  [17-20] 19 (01/18 0506) BP: (127-146)/(56-71) 127/56 mmHg (01/18 0506) SpO2:  [98 %] 98 % (01/18 0506) Last BM Date: 04/12/13  Intake/Output from previous day: 01/17 0701 - 01/18 0700 In: 2200 [I.V.:1900; NG/GT:300] Out: 2300 [Urine:1450; Emesis/NG output:850] Intake/Output this shift:    Resp: clear to auscultation bilaterally Cardio: regular rate and rhythm GI: soft, tender near incisions. incisions look ok. few bs  Lab Results:   Recent Labs  04/14/13 0337 04/15/13 0600  WBC 15.7* 12.7*  HGB 11.7* 9.8*  HCT 35.8* 30.6*  PLT 223 193   BMET  Recent Labs  04/14/13 0337 04/15/13 0600  NA 141 147  K 3.7 3.7  CL 106 109  CO2 24 25  GLUCOSE 147* 103*  BUN 16 14  CREATININE 0.59 0.53  CALCIUM 7.9* 7.7*   PT/INR No results found for this basename: LABPROT, INR,  in the last 72 hours ABG No results found for this basename: PHART, PCO2, PO2, HCO3,  in the last 72 hours  Studies/Results: No results found.  Anti-infectives: Anti-infectives   Start     Dose/Rate Route Frequency Ordered Stop   04/13/13 0945  bacitracin 50,000 Units, gentamicin (GARAMYCIN) 80 mg, ceFAZolin (ANCEF) 1 g in sodium chloride 0.9 % 1,000 mL  Status:  Discontinued      Irrigation Once 04/13/13 0935 04/13/13 0950   04/13/13 0720  dextrose 5 % with cefOXitin (MEFOXIN) ADS Med    Comments:  Maude Leriche   : cabinet override      04/13/13 0720 04/13/13 1929      Assessment/Plan: s/p Procedure(s): LAPAROSCOPIC ASSISTED SIGMOID COLECTOMY (N/A) will try clamping ng today Add protonix for possible GI bleed. Hold heparin and type and screen OOB Hold antihypertensives until bowel function returns  LOS: 2 days    TOTH III,PAUL  S 04/15/2013

## 2013-04-16 LAB — CBC WITH DIFFERENTIAL/PLATELET
Basophils Absolute: 0 10*3/uL (ref 0.0–0.1)
Basophils Relative: 0 % (ref 0–1)
Eosinophils Absolute: 0.1 10*3/uL (ref 0.0–0.7)
Eosinophils Relative: 1 % (ref 0–5)
HEMATOCRIT: 28.3 % — AB (ref 36.0–46.0)
HEMOGLOBIN: 9.2 g/dL — AB (ref 12.0–15.0)
LYMPHS PCT: 6 % — AB (ref 12–46)
Lymphs Abs: 0.7 10*3/uL (ref 0.7–4.0)
MCH: 28.7 pg (ref 26.0–34.0)
MCHC: 32.5 g/dL (ref 30.0–36.0)
MCV: 88.2 fL (ref 78.0–100.0)
MONO ABS: 0.6 10*3/uL (ref 0.1–1.0)
MONOS PCT: 5 % (ref 3–12)
NEUTROS ABS: 11.7 10*3/uL — AB (ref 1.7–7.7)
Neutrophils Relative %: 89 % — ABNORMAL HIGH (ref 43–77)
Platelets: 192 10*3/uL (ref 150–400)
RBC: 3.21 MIL/uL — ABNORMAL LOW (ref 3.87–5.11)
RDW: 15.9 % — ABNORMAL HIGH (ref 11.5–15.5)
WBC: 13.1 10*3/uL — AB (ref 4.0–10.5)

## 2013-04-16 LAB — BASIC METABOLIC PANEL
BUN: 20 mg/dL (ref 6–23)
CO2: 23 mEq/L (ref 19–32)
Calcium: 7.9 mg/dL — ABNORMAL LOW (ref 8.4–10.5)
Chloride: 110 mEq/L (ref 96–112)
Creatinine, Ser: 0.56 mg/dL (ref 0.50–1.10)
GFR calc non Af Amer: >90 mL/min (ref >90)
Glucose, Bld: 169 mg/dL — ABNORMAL HIGH (ref 70–99)
POTASSIUM: 3.7 meq/L (ref 3.7–5.3)
Sodium: 146 mEq/L (ref 137–147)

## 2013-04-16 LAB — GLUCOSE, CAPILLARY
GLUCOSE-CAPILLARY: 161 mg/dL — AB (ref 70–99)
GLUCOSE-CAPILLARY: 104 mg/dL — AB (ref 70–99)
Glucose-Capillary: 155 mg/dL — ABNORMAL HIGH (ref 70–99)
Glucose-Capillary: 93 mg/dL (ref 70–99)
Glucose-Capillary: 96 mg/dL (ref 70–99)

## 2013-04-16 NOTE — Progress Notes (Signed)
Pt up to bathroom to void, passing gas.  Pt diet has been advanced to clears, pt tolerating the clears at this time.

## 2013-04-16 NOTE — Progress Notes (Signed)
3 Days Post-Op  Subjective: She is feeling a little better. She has started passing some flatus  Objective: Vital signs in last 24 hours: Temp:  [98 F (36.7 C)-98.6 F (37 C)] 98.6 F (37 C) (01/18 2045) Pulse Rate:  [79-90] 79 (01/18 2045) Resp:  [18-19] 18 (01/18 2045) BP: (121-128)/(56-81) 121/81 mmHg (01/18 2045) SpO2:  [96 %-99 %] 96 % (01/18 2045) Last BM Date: 04/12/13  Intake/Output from previous day:   Intake/Output this shift:    Resp: clear to auscultation bilaterally Cardio: regular rate and rhythm GI: soft, appropriately tender. good bs. incision ok  Lab Results:   Recent Labs  04/14/13 0337 04/15/13 0600  WBC 15.7* 12.7*  HGB 11.7* 9.8*  HCT 35.8* 30.6*  PLT 223 193   BMET  Recent Labs  04/14/13 0337 04/15/13 0600  NA 141 147  K 3.7 3.7  CL 106 109  CO2 24 25  GLUCOSE 147* 103*  BUN 16 14  CREATININE 0.59 0.53  CALCIUM 7.9* 7.7*   PT/INR No results found for this basename: LABPROT, INR,  in the last 72 hours ABG No results found for this basename: PHART, PCO2, PO2, HCO3,  in the last 72 hours  Studies/Results: No results found.  Anti-infectives: Anti-infectives   Start     Dose/Rate Route Frequency Ordered Stop   04/13/13 0945  bacitracin 50,000 Units, gentamicin (GARAMYCIN) 80 mg, ceFAZolin (ANCEF) 1 g in sodium chloride 0.9 % 1,000 mL  Status:  Discontinued      Irrigation Once 04/13/13 0935 04/13/13 0950   04/13/13 0720  dextrose 5 % with cefOXitin (MEFOXIN) ADS Med    Comments:  Maude Leriche   : cabinet override      04/13/13 0720 04/13/13 1929      Assessment/Plan: s/p Procedure(s): LAPAROSCOPIC ASSISTED SIGMOID COLECTOMY (N/A) Advance diet. Start clears in am Check hg in am D/c ng  LOS: 3 days    TOTH III,PAUL S 04/16/2013

## 2013-04-17 ENCOUNTER — Encounter (HOSPITAL_COMMUNITY): Payer: Self-pay | Admitting: General Surgery

## 2013-04-17 LAB — GLUCOSE, CAPILLARY
GLUCOSE-CAPILLARY: 131 mg/dL — AB (ref 70–99)
Glucose-Capillary: 104 mg/dL — ABNORMAL HIGH (ref 70–99)
Glucose-Capillary: 140 mg/dL — ABNORMAL HIGH (ref 70–99)
Glucose-Capillary: 120 mg/dL — ABNORMAL HIGH (ref 70–99)

## 2013-04-17 MED ORDER — TRIAMTERENE-HCTZ 75-50 MG PO TABS
1.0000 | ORAL_TABLET | Freq: Every day | ORAL | Status: DC
  Administered 2013-04-17: 1 via ORAL
  Filled 2013-04-17 (×3): qty 1

## 2013-04-17 MED ORDER — PANTOPRAZOLE SODIUM 40 MG PO TBEC
40.0000 mg | DELAYED_RELEASE_TABLET | Freq: Two times a day (BID) | ORAL | Status: DC
  Administered 2013-04-17 – 2013-04-18 (×3): 40 mg via ORAL
  Filled 2013-04-17 (×3): qty 1

## 2013-04-17 MED ORDER — LOSARTAN POTASSIUM 50 MG PO TABS
50.0000 mg | ORAL_TABLET | Freq: Every day | ORAL | Status: DC
  Administered 2013-04-17 – 2013-04-18 (×2): 50 mg via ORAL
  Filled 2013-04-17 (×2): qty 1

## 2013-04-17 NOTE — Progress Notes (Signed)
4 Days Post-Op  Subjective: She continues to feel a little better each day. She got a good nights sleep last night.  Objective: Vital signs in last 24 hours: Temp:  [97.8 F (36.6 C)-98.2 F (36.8 C)] 98 F (36.7 C) (01/20 0610) Pulse Rate:  [87-95] 92 (01/20 0610) Resp:  [18-20] 18 (01/20 0610) BP: (133-154)/(56-80) 154/80 mmHg (01/20 0610) SpO2:  [92 %-93 %] 93 % (01/20 0610) Last BM Date: 04/16/13  Intake/Output from previous day: 01/19 0701 - 01/20 0700 In: 2377 [P.O.:360; I.V.:2017] Out: -  Intake/Output this shift:    Resp: clear to auscultation bilaterally Cardio: regular rate and rhythm GI: soft, mild tenderness. good bs. incision looks good  Lab Results:   Recent Labs  04/15/13 0600 04/16/13 0550  WBC 12.7* 13.1*  HGB 9.8* 9.2*  HCT 30.6* 28.3*  PLT 193 192   BMET  Recent Labs  04/15/13 0600 04/16/13 0550  NA 147 146  K 3.7 3.7  CL 109 110  CO2 25 23  GLUCOSE 103* 169*  BUN 14 20  CREATININE 0.53 0.56  CALCIUM 7.7* 7.9*   PT/INR No results found for this basename: LABPROT, INR,  in the last 72 hours ABG No results found for this basename: PHART, PCO2, PO2, HCO3,  in the last 72 hours  Studies/Results: No results found.  Anti-infectives: Anti-infectives   Start     Dose/Rate Route Frequency Ordered Stop   04/13/13 0945  bacitracin 50,000 Units, gentamicin (GARAMYCIN) 80 mg, ceFAZolin (ANCEF) 1 g in sodium chloride 0.9 % 1,000 mL  Status:  Discontinued      Irrigation Once 04/13/13 0935 04/13/13 0950   04/13/13 0720  dextrose 5 % with cefOXitin (MEFOXIN) ADS Med    Comments:  Maude Leriche   : cabinet override      04/13/13 0720 04/13/13 1929      Assessment/Plan: s/p Procedure(s): LAPAROSCOPIC ASSISTED SIGMOID COLECTOMY (N/A) Advance diet Restart home antihypertensive meds ambulate  LOS: 4 days    TOTH III,PAUL S 04/17/2013

## 2013-04-18 LAB — GLUCOSE, CAPILLARY: GLUCOSE-CAPILLARY: 135 mg/dL — AB (ref 70–99)

## 2013-04-18 MED ORDER — OXYCODONE-ACETAMINOPHEN 5-325 MG PO TABS: 1.0000 | ORAL_TABLET | ORAL | Status: DC | PRN

## 2013-04-18 NOTE — Progress Notes (Signed)
5 Days Post-Op  Subjective: Her BP med made her pee all night. Otherwise she feels good and wants to go home  Objective: Vital signs in last 24 hours: Temp:  [97.5 F (36.4 C)-99 F (37.2 C)] 98.6 F (37 C) (01/21 0621) Pulse Rate:  [93-95] 93 (01/21 0621) Resp:  [16-18] 16 (01/21 0621) BP: (139-161)/(64-80) 139/64 mmHg (01/21 0621) SpO2:  [98 %-99 %] 98 % (01/21 0621) Last BM Date: 04/17/13  Intake/Output from previous day: 01/20 0701 - 01/21 0700 In: 922 [P.O.:240; I.V.:682] Out: -  Intake/Output this shift:    Resp: clear to auscultation bilaterally Cardio: regular rate and rhythm GI: soft, nontender. incision looks good. good bs  Lab Results:   Recent Labs  04/16/13 0550  WBC 13.1*  HGB 9.2*  HCT 28.3*  PLT 192   BMET  Recent Labs  04/16/13 0550  NA 146  K 3.7  CL 110  CO2 23  GLUCOSE 169*  BUN 20  CREATININE 0.56  CALCIUM 7.9*   PT/INR No results found for this basename: LABPROT, INR,  in the last 72 hours ABG No results found for this basename: PHART, PCO2, PO2, HCO3,  in the last 72 hours  Studies/Results: No results found.  Anti-infectives: Anti-infectives   Start     Dose/Rate Route Frequency Ordered Stop   04/13/13 0945  bacitracin 50,000 Units, gentamicin (GARAMYCIN) 80 mg, ceFAZolin (ANCEF) 1 g in sodium chloride 0.9 % 1,000 mL  Status:  Discontinued      Irrigation Once 04/13/13 0935 04/13/13 0950   04/13/13 0720  dextrose 5 % with cefOXitin (MEFOXIN) ADS Med    Comments:  Maude Leriche   : cabinet override      04/13/13 0720 04/13/13 1929      Assessment/Plan: s/p Procedure(s): LAPAROSCOPIC ASSISTED SIGMOID COLECTOMY (N/A) Discharge  LOS: 5 days    TOTH III,PAUL S 04/18/2013

## 2013-04-18 NOTE — Progress Notes (Signed)
DC home with friend, verbally understood DC instructions, no questions asked, refused wc at dc. Ambulated to lobby- gait steady.

## 2013-04-18 NOTE — Discharge Summary (Signed)
Physician Discharge Summary  Patient ID: Tiffany Hood MRN: [id number] DOB/AGE: 1941/03/17 73 y.o.  Admit date: 04/13/2013 Discharge date: 04/18/2013  Admission Diagnoses:  Discharge Diagnoses:  Active Problems:   Diverticulitis large intestine w/o perforation or abscess w/o bleeding   Discharged Condition: good  Hospital Course: the pt underwent lap assisted sigmoid colectomy. She had an uneventful postop course. On postop day 4 we advanced her diet and on pod 5 she was ready for discharge home  Consults: None  Significant Diagnostic Studies: none  Treatments: surgery: as above  Discharge Exam: Blood pressure 139/64, pulse 93, temperature 98.6 F (37 C), temperature source Oral, resp. rate 16, height 5\' 2"  (1.575 m), weight 148 lb (67.132 kg), SpO2 98.00%. GI: soft, nontender. incision looks good. good bs  Disposition: Final discharge disposition not confirmed  Discharge Orders   Future Appointments Provider Department Dept Phone   11/01/2013 10:00 AM [facility] [phone]   11/01/2013 10:30 AM Deatra Robinson, MD [facility] [phone]   Future Orders Complete By Expires   Call MD for:  difficulty breathing, headache or visual disturbances  As directed    Call MD for:  extreme fatigue  As directed    Call MD for:  hives  As directed    Call MD for:  persistant dizziness or light-headedness  As directed    Call MD for:  persistant nausea and vomiting  As directed    Call MD for:  redness, tenderness, or signs of infection (pain, swelling, redness, odor or green/yellow discharge around incision site)  As directed    Call MD for:  severe uncontrolled pain  As directed    Call MD for:  temperature >100.4  As directed    Diet - low sodium heart healthy  As directed    Discharge instructions  As directed    Comments:     May shower. No heavy lifting. Diet as tolerated. Resume home meds. Use  colace and miralax to avoid constipation   Increase activity slowly  As directed    No wound care  As directed        Medication List         alendronate 70 MG tablet  Commonly known as:  FOSAMAX  Take 70 mg by mouth every Saturday. Take with a full glass of water on an empty stomach.     aspirin 81 MG tablet  Take 81 mg by mouth every Monday, Wednesday, and Friday.     CALTRATE 600+D PO  Take 1 capsule by mouth daily.     Fish Oil 1000 MG Caps  Take 1,000 mg by mouth daily.     folic acid 1 MG tablet  Commonly known as:  FOLVITE  Take 1 mg by mouth daily.     losartan 50 MG tablet  Commonly known as:  COZAAR  Take 50 mg by mouth daily.     meloxicam 15 MG tablet  Commonly known as:  MOBIC  Take 15 mg by mouth daily as needed (hand swelling).     multivitamin tablet  Take 1 tablet by mouth daily.     oxyCODONE-acetaminophen 5-325 MG per tablet  Commonly known as:  ROXICET  Take 1-2 tablets by mouth every 4 (four) hours as needed for severe pain.     pravastatin 40 MG tablet  Commonly known as:  PRAVACHOL  Take 40 mg by mouth daily.  SYSTANE 0.4-0.3 % Soln  Generic drug:  Polyethyl Glycol-Propyl Glycol  Place 1 drop into both eyes every morning.     triamterene-hydrochlorothiazide 75-50 MG per tablet  Commonly known as:  MAXZIDE  Take 0.5 tablets by mouth daily.     vitamin E 400 UNIT capsule  Take 400 Units by mouth daily.           Follow-up Information   Follow up with Merrie Roof, MD In 1 week. (for staple removal)    Specialty:  General Surgery   Contact information:   [place] [address] [phone]       Signed: Merrie Roof 04/18/2013, 7:40 AM

## 2013-04-20 ENCOUNTER — Telehealth (INDEPENDENT_AMBULATORY_CARE_PROVIDER_SITE_OTHER): Payer: Self-pay

## 2013-04-20 NOTE — Telephone Encounter (Signed)
Patient states she is having abdomen spasm, denies N/V,Consitpation,temp. Dr. Marlou Starks ordered Robaxin 500 mg one po tid/prn # 20 , one refill. Called to [facility] # [phone] Advised patient to call [facility] then go to ER if N/V , temp 100.3 greater, pain becomes worse. Patient verbalized understanding

## 2013-04-21 ENCOUNTER — Inpatient Hospital Stay (HOSPITAL_COMMUNITY)
Admission: EM | Admit: 2013-04-21 | Discharge: 2013-05-07 | DRG: 863 | Disposition: A | Discharge status: Skilled Nursing Facility | Payer: Medicare Other | Attending: General Surgery | Admitting: General Surgery

## 2013-04-21 ENCOUNTER — Encounter (HOSPITAL_COMMUNITY): Payer: Self-pay | Admitting: Emergency Medicine

## 2013-04-21 ENCOUNTER — Telehealth (INDEPENDENT_AMBULATORY_CARE_PROVIDER_SITE_OTHER): Payer: Self-pay | Admitting: Surgery

## 2013-04-21 DIAGNOSIS — Z853 Personal history of malignant neoplasm of breast: Secondary | ICD-10-CM

## 2013-04-21 DIAGNOSIS — K5732 Diverticulitis of large intestine without perforation or abscess without bleeding: Secondary | ICD-10-CM

## 2013-04-21 DIAGNOSIS — I10 Essential (primary) hypertension: Secondary | ICD-10-CM | POA: Diagnosis present

## 2013-04-21 DIAGNOSIS — R509 Fever, unspecified: Secondary | ICD-10-CM | POA: Diagnosis present

## 2013-04-21 DIAGNOSIS — Z823 Family history of stroke: Secondary | ICD-10-CM

## 2013-04-21 DIAGNOSIS — L02219 Cutaneous abscess of trunk, unspecified: Secondary | ICD-10-CM | POA: Diagnosis present

## 2013-04-21 DIAGNOSIS — E785 Hyperlipidemia, unspecified: Secondary | ICD-10-CM | POA: Diagnosis present

## 2013-04-21 DIAGNOSIS — T148XXA Other injury of unspecified body region, initial encounter: Secondary | ICD-10-CM

## 2013-04-21 DIAGNOSIS — T819XXA Unspecified complication of procedure, initial encounter: Secondary | ICD-10-CM

## 2013-04-21 DIAGNOSIS — N731 Chronic parametritis and pelvic cellulitis: Secondary | ICD-10-CM | POA: Diagnosis present

## 2013-04-21 DIAGNOSIS — T8149XA Infection following a procedure, other surgical site, initial encounter: Secondary | ICD-10-CM

## 2013-04-21 DIAGNOSIS — D649 Anemia, unspecified: Secondary | ICD-10-CM | POA: Diagnosis present

## 2013-04-21 DIAGNOSIS — L03319 Cellulitis of trunk, unspecified: Secondary | ICD-10-CM

## 2013-04-21 DIAGNOSIS — L089 Local infection of the skin and subcutaneous tissue, unspecified: Secondary | ICD-10-CM | POA: Diagnosis present

## 2013-04-21 DIAGNOSIS — T8140XA Infection following a procedure, unspecified, initial encounter: Principal | ICD-10-CM | POA: Diagnosis present

## 2013-04-21 DIAGNOSIS — M810 Age-related osteoporosis without current pathological fracture: Secondary | ICD-10-CM | POA: Diagnosis present

## 2013-04-21 DIAGNOSIS — Z7982 Long term (current) use of aspirin: Secondary | ICD-10-CM

## 2013-04-21 DIAGNOSIS — Z8249 Family history of ischemic heart disease and other diseases of the circulatory system: Secondary | ICD-10-CM

## 2013-04-21 DIAGNOSIS — I808 Phlebitis and thrombophlebitis of other sites: Secondary | ICD-10-CM | POA: Diagnosis not present

## 2013-04-21 DIAGNOSIS — Z79899 Other long term (current) drug therapy: Secondary | ICD-10-CM

## 2013-04-21 DIAGNOSIS — Y832 Surgical operation with anastomosis, bypass or graft as the cause of abnormal reaction of the patient, or of later complication, without mention of misadventure at the time of the procedure: Secondary | ICD-10-CM | POA: Diagnosis present

## 2013-04-21 DIAGNOSIS — E119 Type 2 diabetes mellitus without complications: Secondary | ICD-10-CM | POA: Diagnosis present

## 2013-04-21 LAB — BASIC METABOLIC PANEL
BUN: 15 mg/dL (ref 6–23)
CO2: 25 mEq/L (ref 19–32)
Calcium: 8.8 mg/dL (ref 8.4–10.5)
Chloride: 96 mEq/L (ref 96–112)
Creatinine, Ser: 0.6 mg/dL (ref 0.50–1.10)
GFR calc Af Amer: >90 mL/min (ref >90)
GFR calc non Af Amer: 90 mL/min — ABNORMAL LOW (ref >90)
Glucose, Bld: 103 mg/dL — ABNORMAL HIGH (ref 70–99)
Potassium: 3.6 mEq/L — ABNORMAL LOW (ref 3.7–5.3)
Sodium: 136 mEq/L — ABNORMAL LOW (ref 137–147)

## 2013-04-21 LAB — OCCULT BLOOD, POC DEVICE: Fecal Occult Bld: NEGATIVE

## 2013-04-21 LAB — CBC WITH DIFFERENTIAL/PLATELET
Basophils Absolute: 0 10*3/uL (ref 0.0–0.1)
Basophils Relative: 0 % (ref 0–1)
EOS PCT: 1 % (ref 0–5)
Eosinophils Absolute: 0.2 10*3/uL (ref 0.0–0.7)
HCT: 29.3 % — ABNORMAL LOW (ref 36.0–46.0)
Hemoglobin: 10.1 g/dL — ABNORMAL LOW (ref 12.0–15.0)
Lymphocytes Relative: 6 % — ABNORMAL LOW (ref 12–46)
Lymphs Abs: 0.9 10*3/uL (ref 0.7–4.0)
MCH: 29 pg (ref 26.0–34.0)
MCHC: 34.5 g/dL (ref 30.0–36.0)
MCV: 84.2 fL (ref 78.0–100.0)
Monocytes Absolute: 1.3 10*3/uL — ABNORMAL HIGH (ref 0.1–1.0)
Monocytes Relative: 8 % (ref 3–12)
Neutro Abs: 14.2 10*3/uL — ABNORMAL HIGH (ref 1.7–7.7)
Neutrophils Relative %: 86 % — ABNORMAL HIGH (ref 43–77)
Platelets: 282 10*3/uL (ref 150–400)
RBC: 3.48 MIL/uL — ABNORMAL LOW (ref 3.87–5.11)
RDW: 14.8 % (ref 11.5–15.5)
WBC: 16.6 10*3/uL — ABNORMAL HIGH (ref 4.0–10.5)

## 2013-04-21 LAB — LACTIC ACID, PLASMA: LACTIC ACID, VENOUS: 1.2 mmol/L (ref 0.5–2.2)

## 2013-04-21 MED ORDER — SIMVASTATIN 40 MG PO TABS
40.0000 mg | ORAL_TABLET | Freq: Every day | ORAL | Status: DC
  Administered 2013-04-21 – 2013-05-06 (×16): 40 mg via ORAL
  Filled 2013-04-21 (×19): qty 1

## 2013-04-21 MED ORDER — FOLIC ACID 1 MG PO TABS
1.0000 mg | ORAL_TABLET | Freq: Every day | ORAL | Status: DC
  Administered 2013-04-21 – 2013-05-07 (×17): 1 mg via ORAL
  Filled 2013-04-21 (×17): qty 1

## 2013-04-21 MED ORDER — ACETAMINOPHEN 325 MG PO TABS
650.0000 mg | ORAL_TABLET | Freq: Four times a day (QID) | ORAL | Status: DC | PRN
Start: 2013-04-21 — End: 2013-05-07
  Administered 2013-04-30 – 2013-05-01 (×2): 650 mg via ORAL
  Filled 2013-04-21 (×3): qty 2

## 2013-04-21 MED ORDER — TRIAMTERENE-HCTZ 75-50 MG PO TABS
0.5000 | ORAL_TABLET | Freq: Every day | ORAL | Status: DC
  Filled 2013-04-21: qty 0.5

## 2013-04-21 MED ORDER — CEFAZOLIN SODIUM 1-5 GM-% IV SOLN
1.0000 g | Freq: Three times a day (TID) | INTRAVENOUS | Status: DC
Start: 2013-04-21 — End: 2013-04-25
  Administered 2013-04-21 – 2013-04-25 (×12): 1 g via INTRAVENOUS
  Filled 2013-04-21 (×16): qty 50

## 2013-04-21 MED ORDER — POTASSIUM CHLORIDE IN NACL 20-0.9 MEQ/L-% IV SOLN
INTRAVENOUS | Status: DC
  Administered 2013-04-21 – 2013-04-24 (×6): via INTRAVENOUS
  Filled 2013-04-21 (×8): qty 1000

## 2013-04-21 MED ORDER — MORPHINE SULFATE 4 MG/ML IJ SOLN: 4.0000 mg | Freq: Once | INTRAMUSCULAR | Status: DC

## 2013-04-21 MED ORDER — LOSARTAN POTASSIUM 50 MG PO TABS
50.0000 mg | ORAL_TABLET | Freq: Every day | ORAL | Status: DC
  Administered 2013-04-22 – 2013-05-07 (×16): 50 mg via ORAL
  Filled 2013-04-21 (×17): qty 1

## 2013-04-21 MED ORDER — OXYCODONE HCL 5 MG PO TABS
5.0000 mg | ORAL_TABLET | ORAL | Status: DC | PRN
  Administered 2013-04-21 – 2013-05-06 (×18): 5 mg via ORAL
  Filled 2013-04-21 (×20): qty 1

## 2013-04-21 MED ORDER — ASPIRIN 81 MG PO TABS: 81.0000 mg | ORAL_TABLET | ORAL | Status: DC

## 2013-04-21 MED ORDER — ACETAMINOPHEN 650 MG RE SUPP: 650.0000 mg | Freq: Four times a day (QID) | RECTAL | Status: DC | PRN

## 2013-04-21 MED ORDER — SODIUM CHLORIDE 0.9 % IV SOLN
INTRAVENOUS | Status: DC
Start: 2013-04-21 — End: 2013-04-21
  Administered 2013-04-21: 14:00:00 via INTRAVENOUS

## 2013-04-21 MED ORDER — ONDANSETRON HCL 4 MG/2ML IJ SOLN: 4.0000 mg | Freq: Four times a day (QID) | INTRAMUSCULAR | Status: DC | PRN

## 2013-04-21 MED ORDER — ASPIRIN EC 81 MG PO TBEC
81.0000 mg | DELAYED_RELEASE_TABLET | ORAL | Status: DC
  Administered 2013-04-23 – 2013-05-07 (×7): 81 mg via ORAL
  Filled 2013-04-21 (×7): qty 1

## 2013-04-21 MED ORDER — MORPHINE SULFATE 2 MG/ML IJ SOLN
1.0000 mg | INTRAMUSCULAR | Status: DC | PRN
  Administered 2013-04-21: 2 mg via INTRAVENOUS
  Administered 2013-04-22: 4 mg via INTRAVENOUS
  Administered 2013-04-22 (×2): 2 mg via INTRAVENOUS
  Administered 2013-04-23: 4 mg via INTRAVENOUS
  Administered 2013-04-23: 2 mg via INTRAVENOUS
  Filled 2013-04-21 (×4): qty 1
  Filled 2013-04-21: qty 2
  Filled 2013-04-21: qty 1
  Filled 2013-04-21: qty 2

## 2013-04-21 MED ORDER — ENOXAPARIN SODIUM 40 MG/0.4ML ~~LOC~~ SOLN
40.0000 mg | SUBCUTANEOUS | Status: DC
  Administered 2013-04-21 – 2013-04-24 (×4): 40 mg via SUBCUTANEOUS
  Filled 2013-04-21 (×6): qty 0.4

## 2013-04-21 MED ORDER — IOHEXOL 300 MG/ML  SOLN
25.0000 mL | INTRAMUSCULAR | Status: DC
  Administered 2013-04-21: 25 mL via ORAL

## 2013-04-21 MED ORDER — ALENDRONATE SODIUM 70 MG PO TABS: 70.0000 mg | ORAL_TABLET | ORAL | Status: DC

## 2013-04-21 MED ORDER — METHOCARBAMOL 500 MG PO TABS
250.0000 mg | ORAL_TABLET | Freq: Three times a day (TID) | ORAL | Status: DC | PRN
  Administered 2013-04-21 – 2013-05-02 (×2): 250 mg via ORAL
  Filled 2013-04-21 (×2): qty 1

## 2013-04-21 NOTE — Telephone Encounter (Signed)
Patient called back complaining of wound infection.  Wound now red with purulent drainage.  Patient states running fever to 101.  Instructed to come to ER for evaluation by Emergency Room physician.  May need wound opened and dressed.  May need oral antibiotics.  She understands and agrees to come to the emergency dept.  Earnstine Regal, MD, [facility] Office: [phone]

## 2013-04-21 NOTE — Telephone Encounter (Signed)
Patient called with small drainage from upper incision.  Also requesting more help at home (?).  Will ask [facility] nurse to call on Monday 1/26 and assess progress.  May need to come to office early next week for wound check by Dr. Marlou Starks.  Earnstine Regal, MD, [facility] Office: [phone]

## 2013-04-21 NOTE — ED Notes (Signed)
General surgery at bedside. 

## 2013-04-21 NOTE — H&P (Signed)
Tiffany Hood is an 73 y.o. female.   Chief Complaint: Wounded drainage HPI: She is status post a laparoscopic-assisted sigmoid colectomy by Dr. Marlou Starks on January 16. She did well and was discharged home on January 21. She started developing erythema and drainage from her incision so presented to the emergency department. She reports a fever of 101. She has had loose bowel movements with questionable Purulence in the bowel movements. She has spasmodic abdominal pain.  Past Medical History  Diagnosis Date  . Hypertension   . Hyperlipidemia   . Osteoporosis     Fosamax Rxed 2/13  . Breast cancer left  . Diverticulitis     09/14/12  . Diverticulosis   . Personal history of colonic adenomas 11/15/2012  . Heart murmur   . Diabetes mellitus     TYPE 2    Past Surgical History  Procedure Laterality Date  . Abdominal hysterectomy  1965    ovaries remain; for dysfunctional menses; ? endometriosis  . Tonsillectomy and adenoidectomy    . Colonoscopy  2004     Tics  . Removal bone spur Left 2/14    2nd finger  . Colon polyps  2014    Dr Carlean Purl  . Breast lumpectomy Left   . Laparoscopic sigmoid colectomy  04/13/2013    DR TOTH  . Laparoscopic sigmoid colectomy N/A 04/13/2013    Procedure: LAPAROSCOPIC ASSISTED SIGMOID COLECTOMY;  Surgeon: Merrie Roof, MD;  Location: [place];  Service: General;  Laterality: N/A;  . Abdominal surgery      Family History  Problem Relation Age of Onset  . Stroke Mother 58  . Hypertension Mother   . Lung cancer Father     smoker  . Hypertension Sister   . Hypertension Brother   . Stroke Brother 29  . Heart disease Brother 18  . Diabetes Neg Hx   . Colon cancer Neg Hx    Social History:  reports that she has never smoked. She has never used smokeless tobacco. She reports that she does not drink alcohol or use illicit drugs.  Allergies: No Known Allergies   (Not in a hospital admission)  Results for orders placed during the hospital encounter of  04/21/13 (from the past 48 hour(s))  CBC WITH DIFFERENTIAL     Status: Abnormal   Collection Time    04/21/13  1:56 PM      Result Value Range   WBC 16.6 (*) 4.0 - 10.5 K/uL   RBC 3.48 (*) 3.87 - 5.11 MIL/uL   Hemoglobin 10.1 (*) 12.0 - 15.0 g/dL   HCT 29.3 (*) 36.0 - 46.0 %   MCV 84.2  78.0 - 100.0 fL   MCH 29.0  26.0 - 34.0 pg   MCHC 34.5  30.0 - 36.0 g/dL   RDW 14.8  11.5 - 15.5 %   Platelets 282  150 - 400 K/uL   Neutrophils Relative % 86 (*) 43 - 77 %   Neutro Abs 14.2 (*) 1.7 - 7.7 K/uL   Lymphocytes Relative 6 (*) 12 - 46 %   Lymphs Abs 0.9  0.7 - 4.0 K/uL   Monocytes Relative 8  3 - 12 %   Monocytes Absolute 1.3 (*) 0.1 - 1.0 K/uL   Eosinophils Relative 1  0 - 5 %   Eosinophils Absolute 0.2  0.0 - 0.7 K/uL   Basophils Relative 0  0 - 1 %   Basophils Absolute 0.0  0.0 - 0.1 K/uL  BASIC  METABOLIC PANEL     Status: Abnormal   Collection Time    04/21/13  1:56 PM      Result Value Range   Sodium 136 (*) 137 - 147 mEq/L   Potassium 3.6 (*) 3.7 - 5.3 mEq/L   Chloride 96  96 - 112 mEq/L   CO2 25  19 - 32 mEq/L   Glucose, Bld 103 (*) 70 - 99 mg/dL   BUN 15  6 - 23 mg/dL   Creatinine, Ser 0.60  0.50 - 1.10 mg/dL   Calcium 8.8  8.4 - 10.5 mg/dL   GFR calc non Af Amer 90 (*) >90 mL/min   GFR calc Af Amer >90  >90 mL/min   Comment: (NOTE)     The eGFR has been calculated using the CKD EPI equation.     This calculation has not been validated in all clinical situations.     eGFR's persistently <90 mL/min signify possible Chronic Kidney     Disease.  LACTIC ACID, PLASMA     Status: None   Collection Time    04/21/13  1:56 PM      Result Value Range   Lactic Acid, Venous 1.2  0.5 - 2.2 mmol/L  OCCULT BLOOD, POC DEVICE     Status: None   Collection Time    04/21/13  2:15 PM      Result Value Range   Fecal Occult Bld NEGATIVE  NEGATIVE   No results found.  Review of Systems  All other systems reviewed and are negative.    Blood pressure 118/53, pulse 93,  temperature 99.1 F (37.3 C), temperature source Oral, resp. rate 18, SpO2 97.00%. Physical Exam  Constitutional: She is oriented to person, place, and time. She appears well-developed and well-nourished.  Mildly uncomfortable but in no acute distress  Eyes: Conjunctivae are normal. Pupils are equal, round, and reactive to light.  Neck: Normal range of motion. Neck supple.  Cardiovascular: Normal rate, regular rhythm, normal heart sounds and intact distal pulses.   Respiratory: Effort normal and breath sounds normal. No respiratory distress.  GI: Soft. There is no rebound.  There is cellulitis of her abdominal wound with an obvious wound infection. I removed her staples and several areas of her midline incision and drained a large amount of purulence. I then packed the wound with wet gauze.  Other than her incision, the rest of her abdomen is soft and nontender  Neurological: She is alert and oriented to person, place, and time.  Skin: Skin is warm. There is erythema.  Psychiatric: Her behavior is normal. Judgment normal.     Assessment/Plan Postop wound infection  Given her overall exam,  I do not believe she has an intra-abdominal process or problem with her anastomosis currently. She will be admitted and started on IV antibiotics and wet-to-dry dressing changes on her abdominal wound. If she does not improve, I will then order a CAT scan of her abdomen and pelvis.  Tiffany Hood A 04/21/2013, 2:59 PM

## 2013-04-21 NOTE — ED Provider Notes (Signed)
CSN: [id number]     Arrival date & time 04/21/13  1310 History   First MD Initiated Contact with Patient 04/21/13 1323     Chief Complaint  Patient presents with  . Abdominal Pain  . Fever   (Consider location/radiation/quality/duration/timing/severity/associated sxs/prior Treatment) HPI  Patient to the ER with complaints of abdominal pain. Pt was having chronic diverticulitis for which Gen Surgery decided partial bowel resection would be the most therapeutic treatment. She had the surgery done on 04/13/2013 by Dr. Marlou Starks. It was intended to be a laparotomy but after a kink in the bowel was found it ended up being an open procedure. She says that after the surgery she mainly felt better but then a few days ago she started to feel even worse. She says that she has had a clear pussy drainage coming from her abdominal wound. She also describes having pink puss coming from her rectum. Today after she got up for the first time in a while a large movement of puss and bloody substance came from her rectum. She describes it as "a big mess". She is now wearing depends. A neighbor who is a nurse took her temperature says that she has a fever of 101. Pt has been feeling weak.   Past Medical History  Diagnosis Date  . Hypertension   . Hyperlipidemia   . Osteoporosis     Fosamax Rxed 2/13  . Breast cancer left  . Diverticulitis     09/14/12  . Diverticulosis   . Personal history of colonic adenomas 11/15/2012  . Heart murmur   . Diabetes mellitus     TYPE 2   Past Surgical History  Procedure Laterality Date  . Abdominal hysterectomy  1965    ovaries remain; for dysfunctional menses; ? endometriosis  . Tonsillectomy and adenoidectomy    . Colonoscopy  2004     Tics  . Removal bone spur Left 2/14    2nd finger  . Colon polyps  2014    Dr Carlean Purl  . Breast lumpectomy Left   . Laparoscopic sigmoid colectomy  04/13/2013    DR TOTH  . Laparoscopic sigmoid colectomy N/A 04/13/2013    Procedure:  LAPAROSCOPIC ASSISTED SIGMOID COLECTOMY;  Surgeon: Merrie Roof, MD;  Location: [place];  Service: General;  Laterality: N/A;  . Abdominal surgery     Family History  Problem Relation Age of Onset  . Stroke Mother 37  . Hypertension Mother   . Lung cancer Father     smoker  . Hypertension Sister   . Hypertension Brother   . Stroke Brother 2  . Heart disease Brother 28  . Diabetes Neg Hx   . Colon cancer Neg Hx    History  Substance Use Topics  . Smoking status: Never Smoker   . Smokeless tobacco: Never Used  . Alcohol Use: No   OB History   Grav Para Term Preterm Abortions TAB SAB Ect Mult Living                 Review of Systems The patient denies anorexia vision loss, decreased hearing, hoarseness, chest pain, syncope, dyspnea on exertion, peripheral edema, balance deficits, hemoptysis,  melena, hematochezia, severe indigestion/heartburn, hematuria, incontinence, genital sores, , suspicious skin lesions, transient blindness, difficulty walking, depression, unusual weight change,enlarged lymph nodes, angioedema, and breast masses.  Allergies  Review of patient's allergies indicates no known allergies.  Home Medications   Current Outpatient Rx  Name  Route  Sig  Dispense  Refill  . alendronate (FOSAMAX) 70 MG tablet   Oral   Take 70 mg by mouth every Saturday. Take with a full glass of water on an empty stomach.         Marland Kitchen aspirin 81 MG tablet   Oral   Take 81 mg by mouth every Monday, Wednesday, and Friday.          . Calcium Carbonate-Vitamin D (CALTRATE 600+D PO)   Oral   Take 1 capsule by mouth daily.          . folic acid (FOLVITE) 1 MG tablet   Oral   Take 1 mg by mouth daily.         Marland Kitchen losartan (COZAAR) 50 MG tablet   Oral   Take 50 mg by mouth daily.         . methocarbamol (ROBAXIN) 500 MG tablet   Oral   Take 250 mg by mouth every 8 (eight) hours as needed for muscle spasms.         . Multiple Vitamin (MULTIVITAMIN) tablet   Oral    Take 1 tablet by mouth daily.           . Omega-3 Fatty Acids (FISH OIL) 1000 MG CAPS   Oral   Take 1,000 mg by mouth daily.          Marland Kitchen oxyCODONE-acetaminophen (ROXICET) 5-325 MG per tablet   Oral   Take 1-2 tablets by mouth every 4 (four) hours as needed for severe pain.   50 tablet   0   . Polyethyl Glycol-Propyl Glycol (SYSTANE) 0.4-0.3 % SOLN   Both Eyes   Place 1 drop into both eyes every morning.         . pravastatin (PRAVACHOL) 40 MG tablet   Oral   Take 40 mg by mouth daily.         Marland Kitchen triamterene-hydrochlorothiazide (MAXZIDE) 75-50 MG per tablet   Oral   Take 0.5 tablets by mouth daily.         . vitamin E 400 UNIT capsule   Oral   Take 400 Units by mouth daily.            BP 118/53  Pulse 93  Temp(Src) 99.1 F (37.3 C) (Oral)  Resp 18  SpO2 97% Physical Exam  Nursing note and vitals reviewed. Constitutional: She appears well-developed and well-nourished. No distress.  HENT:  Head: Normocephalic and atraumatic.  Eyes: Pupils are equal, round, and reactive to light.  Neck: Normal range of motion. Neck supple.  Cardiovascular: Normal rate and regular rhythm.   Pulmonary/Chest: Effort normal.  Abdominal: Soft. There is tenderness.    Genitourinary: Guaiac negative stool.  Normal rectal exam. No pain with DRE. No puss from rectum noted.  Neurological: She is alert.  Skin: Skin is warm and dry.    ED Course  Procedures (including critical care time) Labs Review Labs Reviewed  CBC WITH DIFFERENTIAL - Abnormal; Notable for the following:    WBC 16.6 (*)    RBC 3.48 (*)    Hemoglobin 10.1 (*)    HCT 29.3 (*)    Neutrophils Relative % 86 (*)    Neutro Abs 14.2 (*)    Lymphocytes Relative 6 (*)    Monocytes Absolute 1.3 (*)    All other components within normal limits  BASIC METABOLIC PANEL - Abnormal; Notable for the following:    Sodium 136 (*)    Potassium 3.6 (*)  Glucose, Bld 103 (*)    GFR calc non Af Amer 90 (*)    All other  components within normal limits  CULTURE, BLOOD (ROUTINE X 2)  CULTURE, BLOOD (ROUTINE X 2)  LACTIC ACID, PLASMA  OCCULT BLOOD, POC DEVICE   Imaging Review No results found.  EKG Interpretation   None       MDM   1. Post-operative complication   2. Wound infection after surgery      CT abd/pelv are pending. Labs are pending I spoke with general surgery to ensure work-up is appropriate. [facility] says that they will see her in the ED (not sure if it is before or after CT scan).   Pt declines pain medication at this time, she says she is not in pain.  The surgeon asked if I would cancel the CT scan, he says she has a wound infection and may need surgery. He says he will admit.     Linus Mako, PA-C 04/21/13 1454

## 2013-04-21 NOTE — Progress Notes (Signed)
PHARMACIST - PHYSICIAN COMMUNICATION  CONCERNING: P&T Medication Policy Regarding Oral Bisphosphonates  RECOMMENDATION: Your order for alendronate (Fosamax), ibandronate (Boniva), or risedronate (Actonel) has been discontinued at this time.  If the patient's post-hospital medical condition warrants safe use of this class of drugs, please resume the pre-hospital regimen upon discharge.  DESCRIPTION:  Alendronate (Fosamax), ibandronate (Boniva), and risedronate (Actonel) can cause severe esophageal erosions in patients who are unable to remain upright at least 30 minutes after taking this medication.   Since brief interruptions in therapy are thought to have minimal impact on bone mineral density, the [facility] has established that bisphosphonate orders should be routinely discontinued during hospitalization.   To override this safety policy and permit administration of Boniva, Fosamax, or Actonel in the hospital, prescribers must write "DO NOT HOLD" in the comments section when placing the order for this class of medications.  Eudelia Bunch, Pharm.D. [phone] 04/21/2013 5:23 PM

## 2013-04-21 NOTE — ED Notes (Signed)
Pt is here with fever of 101, abdominal spasms, pt had diverticulitis and had to have part of colon removed, pt reports oozing from rectum and abdominal surgical incision.  Pt reports weakness all over.  Surgery done 1-16/15

## 2013-04-22 LAB — CBC
HCT: 26.8 % — ABNORMAL LOW (ref 36.0–46.0)
Hemoglobin: 9 g/dL — ABNORMAL LOW (ref 12.0–15.0)
MCH: 28.3 pg (ref 26.0–34.0)
MCHC: 33.6 g/dL (ref 30.0–36.0)
MCV: 84.3 fL (ref 78.0–100.0)
PLATELETS: 259 10*3/uL (ref 150–400)
RBC: 3.18 MIL/uL — ABNORMAL LOW (ref 3.87–5.11)
RDW: 14.8 % (ref 11.5–15.5)
WBC: 17.2 10*3/uL — ABNORMAL HIGH (ref 4.0–10.5)

## 2013-04-22 MED ORDER — TRIAMTERENE-HCTZ 37.5-25 MG PO TABS
1.0000 | ORAL_TABLET | Freq: Every day | ORAL | Status: DC
Start: 2013-04-22 — End: 2013-05-07
  Administered 2013-04-22 – 2013-05-07 (×16): 1 via ORAL
  Filled 2013-04-22 (×17): qty 1

## 2013-04-22 NOTE — Progress Notes (Signed)
  Subjective: Pt doing well.  Feels much better today.  Objective: Vital signs in last 24 hours: Temp:  [98 F (36.7 C)-99.2 F (37.3 C)] 99.2 F (37.3 C) (01/25 0544) Pulse Rate:  [75-96] 86 (01/25 0544) Resp:  [17-18] 18 (01/25 0544) BP: (96-123)/(39-62) 123/39 mmHg (01/25 0544) SpO2:  [95 %-100 %] 95 % (01/25 0544) Last BM Date: 04/21/13  Intake/Output from previous day: 01/24 0701 - 01/25 0700 In: 857.5 [I.V.:857.5] Out: 250 [Urine:250] Intake/Output this shift: Total I/O In: 240 [P.O.:240] Out: -   PE: Gen:  Alert, NAD, pleasant Card:  RRR, no M/G/R heard Pulm:  CTA, no W/R/R Abd: Soft, NT/ND, +BS, some slight erythema around midline wound,   Lab Results:   Recent Labs  04/21/13 1356 04/22/13 0705  WBC 16.6* 17.2*  HGB 10.1* 9.0*  HCT 29.3* 26.8*  PLT 282 259   BMET  Recent Labs  04/21/13 1356  NA 136*  K 3.6*  CL 96  CO2 25  GLUCOSE 103*  BUN 15  CREATININE 0.60  CALCIUM 8.8   PT/INR No results found for this basename: LABPROT, INR,  in the last 72 hours CMP     Component Value Date/Time   NA 136* 04/21/2013 1356   NA 142 10/30/2012 1325   K 3.6* 04/21/2013 1356   K 4.3 10/30/2012 1325   CL 96 04/21/2013 1356   CO2 25 04/21/2013 1356   CO2 28 10/30/2012 1325   GLUCOSE 103* 04/21/2013 1356   GLUCOSE 87 10/30/2012 1325   BUN 15 04/21/2013 1356   BUN 19.9 10/30/2012 1325   CREATININE 0.60 04/21/2013 1356   CREATININE 0.7 10/30/2012 1325   CALCIUM 8.8 04/21/2013 1356   CALCIUM 10.6* 10/30/2012 1325   PROT 7.9 10/30/2012 1325   PROT 6.4 01/19/2012 1220   ALBUMIN 3.7 10/30/2012 1325   ALBUMIN 3.4* 01/19/2012 1220   AST 21 10/30/2012 1325   AST 14 01/19/2012 1220   ALT 19 10/30/2012 1325   ALT 19 01/19/2012 1220   ALKPHOS 71 10/30/2012 1325   ALKPHOS 61 01/19/2012 1220   BILITOT 0.30 10/30/2012 1325   BILITOT 0.4 01/19/2012 1220   GFRNONAA 90* 04/21/2013 1356   GFRAA >90 04/21/2013 1356   Lipase  No results found for this basename: lipase        Studies/Results: No results found.  Anti-infectives: Anti-infectives   Start     Dose/Rate Route Frequency Ordered Stop   04/21/13 1800  ceFAZolin (ANCEF) IVPB 1 g/50 mL premix     1 g 100 mL/hr over 30 Minutes Intravenous 3 times per day 04/21/13 1717         Assessment/Plan Post-op wound infection POD # 9 s/p lap assisted sigmoid colectomy with mobilization of splenic flexure (Dr. Marlou Starks) Febrile Leukocytosis - 17.2 Anemia - dilutional Hgb 9.0  Plan: 1.  Continue conservative management for now 2.  WD dressing changes BID,  3.  Continue antibiotics (Ancef day #2) 4.  Ambulate and IS 5.  SCD's and lovenox     LOS: 1 day    Coralie Keens 04/22/2013, 12:37 PM Pager: [phone]

## 2013-04-22 NOTE — ED Provider Notes (Signed)
Medical screening examination/treatment/procedure(s) were performed by non-physician practitioner and as supervising physician I was immediately available for consultation/collaboration.   Leota Jacobsen, MD 04/22/13 1113

## 2013-04-23 NOTE — Progress Notes (Signed)
Blood observed mixed with urine in bedside commode. Blood noted at vaginal area while laying in bed. Pt stated that she noted blood from her vaginal area on Saturday before coming to the hospital. Pt stated that she told the physician.

## 2013-04-23 NOTE — Progress Notes (Signed)
  Subjective: She is feeling much better  Objective: Vital signs in last 24 hours: Temp:  [98.2 F (36.8 C)-98.4 F (36.9 C)] 98.4 F (36.9 C) (01/26 0504) Pulse Rate:  [75-82] 80 (01/26 0504) Resp:  [18-20] 20 (01/26 0504) BP: (98-110)/(48-55) 98/51 mmHg (01/26 0504) SpO2:  [95 %-98 %] 95 % (01/26 0504) Last BM Date: 04/21/13  Intake/Output from previous day: 01/25 0701 - 01/26 0700 In: 3094 [P.O.:1320; I.V.:1774] Out: -  Intake/Output this shift:    Resp: clear to auscultation bilaterally Cardio: regular rate and rhythm GI: soft, nontender. open wound looks clean. still with some cellulitis of skin around wound  Lab Results:   Recent Labs  04/21/13 1356 04/22/13 0705  WBC 16.6* 17.2*  HGB 10.1* 9.0*  HCT 29.3* 26.8*  PLT 282 259   BMET  Recent Labs  04/21/13 1356  NA 136*  K 3.6*  CL 96  CO2 25  GLUCOSE 103*  BUN 15  CREATININE 0.60  CALCIUM 8.8   PT/INR No results found for this basename: LABPROT, INR,  in the last 72 hours ABG No results found for this basename: PHART, PCO2, PO2, HCO3,  in the last 72 hours  Studies/Results: No results found.  Anti-infectives: Anti-infectives   Start     Dose/Rate Route Frequency Ordered Stop   04/21/13 1800  ceFAZolin (ANCEF) IVPB 1 g/50 mL premix     1 g 100 mL/hr over 30 Minutes Intravenous 3 times per day 04/21/13 1717        Assessment/Plan: s/p * No surgery found * Advance diet Continue ancef Will get PT and Rehab consults Check wbc tomorrow  LOS: 2 days    TOTH III,Nyaja [place] S 04/23/2013

## 2013-04-24 LAB — CBC WITH DIFFERENTIAL/PLATELET
Basophils Absolute: 0 10*3/uL (ref 0.0–0.1)
Basophils Relative: 0 % (ref 0–1)
Eosinophils Absolute: 0.6 10*3/uL (ref 0.0–0.7)
Eosinophils Relative: 5 % (ref 0–5)
HCT: 27.6 % — ABNORMAL LOW (ref 36.0–46.0)
Hemoglobin: 9.2 g/dL — ABNORMAL LOW (ref 12.0–15.0)
LYMPHS ABS: 1.5 10*3/uL (ref 0.7–4.0)
LYMPHS PCT: 12 % (ref 12–46)
MCH: 28.7 pg (ref 26.0–34.0)
MCHC: 33.3 g/dL (ref 30.0–36.0)
MCV: 86 fL (ref 78.0–100.0)
Monocytes Absolute: 1.3 10*3/uL — ABNORMAL HIGH (ref 0.1–1.0)
Monocytes Relative: 11 % (ref 3–12)
NEUTROS ABS: 9 10*3/uL — AB (ref 1.7–7.7)
NEUTROS PCT: 73 % (ref 43–77)
PLATELETS: 354 10*3/uL (ref 150–400)
RBC: 3.21 MIL/uL — AB (ref 3.87–5.11)
RDW: 14.9 % (ref 11.5–15.5)
WBC: 12.3 10*3/uL — AB (ref 4.0–10.5)

## 2013-04-24 LAB — BASIC METABOLIC PANEL
BUN: 6 mg/dL (ref 6–23)
CHLORIDE: 101 meq/L (ref 96–112)
CO2: 25 mEq/L (ref 19–32)
Calcium: 8.4 mg/dL (ref 8.4–10.5)
Creatinine, Ser: 0.51 mg/dL (ref 0.50–1.10)
GFR calc Af Amer: >90 mL/min (ref >90)
GLUCOSE: 104 mg/dL — AB (ref 70–99)
POTASSIUM: 4.4 meq/L (ref 3.7–5.3)
SODIUM: 139 meq/L (ref 137–147)

## 2013-04-24 NOTE — Progress Notes (Signed)
  Subjective: She continues to feel better.  Objective: Vital signs in last 24 hours: Temp:  [98 F (36.7 C)] 98 F (36.7 C) (01/27 0545) Pulse Rate:  [77-95] 95 (01/27 0545) Resp:  [18] 18 (01/27 0545) BP: (98-129)/(40-55) 109/40 mmHg (01/27 0545) SpO2:  [95 %-100 %] 95 % (01/27 0545) Weight:  [156 lb 12 oz (71.1 kg)] 156 lb 12 oz (71.1 kg) (01/26 1052) Last BM Date: 04/24/13  Intake/Output from previous day: 01/26 0701 - 01/27 0700 In: 830 [P.O.:830] Out: -  Intake/Output this shift:    Resp: clear to auscultation bilaterally Cardio: regular rate and rhythm GI: soft, nontender. open wound looks clean. positive bm  Lab Results:   Recent Labs  04/22/13 0705 04/24/13 0530  WBC 17.2* 12.3*  HGB 9.0* 9.2*  HCT 26.8* 27.6*  PLT 259 354   BMET  Recent Labs  04/21/13 1356 04/24/13 0530  NA 136* 139  K 3.6* 4.4  CL 96 101  CO2 25 25  GLUCOSE 103* 104*  BUN 15 6  CREATININE 0.60 0.51  CALCIUM 8.8 8.4   PT/INR No results found for this basename: LABPROT, INR,  in the last 72 hours ABG No results found for this basename: PHART, PCO2, PO2, HCO3,  in the last 72 hours  Studies/Results: No results found.  Anti-infectives: Anti-infectives   Start     Dose/Rate Route Frequency Ordered Stop   04/21/13 1800  ceFAZolin (ANCEF) IVPB 1 g/50 mL premix     1 g 100 mL/hr over 30 Minutes Intravenous 3 times per day 04/21/13 1717        Assessment/Plan: s/p * No surgery found * Advance diet Continue IV abx until wbc normalizes PT Await rehab evaluation  LOS: 3 days    TOTH III,PAUL S 04/24/2013

## 2013-04-24 NOTE — Progress Notes (Signed)
Patient admitted with wound drainage as well as fever on 04/21/13. She will need therapy evaluations to determine appropriated rehab needs. She reports that she has walked around the unit twice today with stand by assist and is walking to the bathroom with nursing. She states that her bed at home is too high/difficulty to get into, she is concerned about wound management and would like to go to [facility] for rehab.  Will defer CIR consult--is at supervision level per PT. Would recommend walking bid with nursing to prevent deconditioning.

## 2013-04-24 NOTE — Evaluation (Signed)
Physical Therapy Evaluation Patient Details Name: Tiffany Hood MRN: [id number] DOB: [birth year] Today's Date: 04/24/2013 Time: 1020-1039 PT Time Calculation (min): 19 min  PT Assessment / Plan / Recommendation History of Present Illness  pt presents with recent Lap Chole and now with wound infection.    Clinical Impression  Pt generally deconditioned and notes she has been having difficulty trying to manage at home.  Pt does not have any family available for support and pt worries about wound management as she feels she can not change dressing appropriately.  At this time SNF is safest D/C option for pt.  Will continue to follow.      PT Assessment  Patient needs continued PT services    Follow Up Recommendations  SNF    Does the patient have the potential to tolerate intense rehabilitation      Barriers to Discharge Decreased caregiver support      Equipment Recommendations  None recommended by PT    Recommendations for Other Services     Frequency Min 3X/week    Precautions / Restrictions Precautions Precautions: None Restrictions Weight Bearing Restrictions: No   Pertinent Vitals/Pain Abdominal "Discomfort"      Mobility  Transfers Overall transfer level: Needs assistance Equipment used: None Transfers: Sit to/from Stand Sit to Stand: Supervision General transfer comment: pt requires increased time and cues for UE use.   Ambulation/Gait Ambulation/Gait assistance: Supervision Ambulation Distance (Feet): 500 Feet Assistive device: None Gait Pattern/deviations: Step-through pattern;Decreased stride length General Gait Details: pt moves slowly and requires S for safety.  pt indicates discomfort in abdomen during mobility.      Exercises     PT Diagnosis: Difficulty walking  PT Problem List: Decreased activity tolerance;Decreased balance;Decreased mobility;Pain PT Treatment Interventions: DME instruction;Gait training;Stair training;Functional mobility  training;Therapeutic activities;Therapeutic exercise;Balance training;Patient/family education     PT Goals(Current goals can be found in the care plan section) Acute Rehab PT Goals Patient Stated Goal: Get back to normal PT Goal Formulation: With patient Time For Goal Achievement: 05/08/13 Potential to Achieve Goals: Good  Visit Information  Last PT Received On: 04/24/13 Assistance Needed: +1 History of Present Illness: pt presents with recent Lap Chole and now with wound infection.         Prior [place] expects to be discharged to:: Skilled nursing facility Living Arrangements: Alone Additional Comments: pt has been unable to manage at home alone.   Prior Function Level of Independence: Independent Comments: pt was independent prior to recent surgery.   Communication Communication: No difficulties    Cognition  Cognition Arousal/Alertness: Awake/alert Behavior During Therapy: WFL for tasks assessed/performed Overall Cognitive Status: Within Functional Limits for tasks assessed    Extremity/Trunk Assessment Upper Extremity Assessment Upper Extremity Assessment: Overall WFL for tasks assessed Lower Extremity Assessment Lower Extremity Assessment: Overall WFL for tasks assessed   Balance Balance Overall balance assessment: Needs assistance Standing balance support: No upper extremity supported Standing balance-Leahy Scale: Fair Standing balance comment: pt needs A with any balance challenges due to deconditioning and abdominal pain.    End of Session PT - End of Session Activity Tolerance: Patient tolerated treatment well Patient left: in chair;with call bell/phone within reach Nurse Communication: Mobility status  GP     Catarina Hartshorn, Pearl 04/24/2013, 1:31 PM

## 2013-04-25 ENCOUNTER — Inpatient Hospital Stay (HOSPITAL_COMMUNITY): Payer: Medicare Other

## 2013-04-25 MED ORDER — ENOXAPARIN SODIUM 40 MG/0.4ML ~~LOC~~ SOLN: 40.0000 mg | SUBCUTANEOUS | Status: DC

## 2013-04-25 MED ORDER — IOHEXOL 300 MG/ML  SOLN
80.0000 mL | Freq: Once | INTRAMUSCULAR | Status: AC | PRN
  Administered 2013-04-25: 80 mL via INTRAVENOUS

## 2013-04-25 MED ORDER — CIPROFLOXACIN HCL 500 MG PO TABS
500.0000 mg | ORAL_TABLET | Freq: Two times a day (BID) | ORAL | Status: DC
  Administered 2013-04-25 – 2013-05-03 (×16): 500 mg via ORAL
  Filled 2013-04-25 (×24): qty 1

## 2013-04-25 MED ORDER — IOHEXOL 300 MG/ML  SOLN
25.0000 mL | INTRAMUSCULAR | Status: AC
  Administered 2013-04-25 (×2): 25 mL via ORAL

## 2013-04-25 NOTE — Progress Notes (Signed)
IR aware of drain request for pelvic abscess. Reviewed by Dr. Vernard Gambles. NPO p MN Hold Lovenox this pm. IR team will see pt in am to discuss procedure.  Ascencion Dike PA-C Interventional Radiology 04/25/2013 5:00 PM

## 2013-04-25 NOTE — Progress Notes (Signed)
  Subjective: Complains mostly of discharge from vagina  Objective: Vital signs in last 24 hours: Temp:  [98.2 F (36.8 C)-98.4 F (36.9 C)] 98.4 F (36.9 C) (01/28 0625) Pulse Rate:  [70-78] 70 (01/27 2128) Resp:  [16-17] 16 (01/28 0625) BP: (112-121)/(54-58) 117/58 mmHg (01/28 0625) SpO2:  [98 %-100 %] 98 % (01/28 0625) Last BM Date: 04/24/13  Intake/Output from previous day: 01/27 0701 - 01/28 0700 In: 4219.5 [I.V.:3669.5; IV Piggyback:550] Out: 200 [Urine:200] Intake/Output this shift:    Resp: clear to auscultation bilaterally Cardio: regular rate and rhythm GI: soft, minimal tenderness. incision open with small exudate at bottom but otherwise clean  Lab Results:   Recent Labs  04/24/13 0530  WBC 12.3*  HGB 9.2*  HCT 27.6*  PLT 354   BMET  Recent Labs  04/24/13 0530  NA 139  K 4.4  CL 101  CO2 25  GLUCOSE 104*  BUN 6  CREATININE 0.51  CALCIUM 8.4   PT/INR No results found for this basename: LABPROT, INR,  in the last 72 hours ABG No results found for this basename: PHART, PCO2, PO2, HCO3,  in the last 72 hours  Studies/Results: No results found.  Anti-infectives: Anti-infectives   Start     Dose/Rate Route Frequency Ordered Stop   04/21/13 1800  ceFAZolin (ANCEF) IVPB 1 g/50 mL premix     1 g 100 mL/hr over 30 Minutes Intravenous 3 times per day 04/21/13 1717        Assessment/Plan: s/p * No surgery found * Continue ancef. Will get CT to evaluate for possible abscess given vaginal discharge Will need SNF placement  LOS: 4 days    TOTH III,Lindey Renzulli S 04/25/2013

## 2013-04-26 ENCOUNTER — Inpatient Hospital Stay (HOSPITAL_COMMUNITY): Payer: Medicare Other

## 2013-04-26 LAB — PROTIME-INR
INR: 1.06 (ref 0.00–1.49)
Prothrombin Time: 13.6 seconds (ref 11.6–15.2)

## 2013-04-26 LAB — APTT: aPTT: 31 seconds (ref 24–37)

## 2013-04-26 MED ORDER — FENTANYL CITRATE 0.05 MG/ML IJ SOLN
INTRAMUSCULAR | Status: AC
  Filled 2013-04-26: qty 4

## 2013-04-26 MED ORDER — MIDAZOLAM HCL 2 MG/2ML IJ SOLN
INTRAMUSCULAR | Status: AC
  Filled 2013-04-26: qty 4

## 2013-04-26 MED ORDER — KCL IN DEXTROSE-NACL 20-5-0.9 MEQ/L-%-% IV SOLN
INTRAVENOUS | Status: DC
  Administered 2013-04-26 (×2): via INTRAVENOUS
  Filled 2013-04-26 (×11): qty 1000

## 2013-04-26 MED ORDER — FENTANYL CITRATE 0.05 MG/ML IJ SOLN
INTRAMUSCULAR | Status: AC | PRN
  Administered 2013-04-26 (×3): 50 ug via INTRAVENOUS

## 2013-04-26 MED ORDER — LIDOCAINE HCL 1 % IJ SOLN
INTRAMUSCULAR | Status: AC
  Filled 2013-04-26: qty 10

## 2013-04-26 MED ORDER — MIDAZOLAM HCL 2 MG/2ML IJ SOLN
INTRAMUSCULAR | Status: AC | PRN
  Administered 2013-04-26 (×3): 1 mg via INTRAVENOUS

## 2013-04-26 NOTE — Progress Notes (Signed)
Clinical Social Work Department CLINICAL SOCIAL WORK PLACEMENT NOTE 04/26/2013  Patient:  Tiffany Hood, Tiffany Hood  Account Number:  [id number] Admit date:  04/21/2013  Clinical Social Worker:  Pete Pelt, CLINICAL SOCIAL WORKER  Date/time:  04/26/2013 04:16 PM  Clinical Social Work is seeking post-discharge placement for this patient at the following level of care:   SKILLED NURSING   (*CSW will update this form in Epic as items are completed)   04/26/2013  Patient/family provided with [place] Department of Clinical Social Work's list of facilities offering this level of care within the geographic area requested by the patient (or if unable, by the patient's family).  04/26/2013  Patient/family informed of their freedom to choose among providers that offer the needed level of care, that participate in Medicare, Medicaid or managed care program needed by the patient, have an available bed and are willing to accept the patient.  04/26/2013  Patient/family informed of [facility]' ownership interest in [facility], as well as of the fact that they are under no obligation to receive care at this facility.  PASARR submitted to EDS on 04/26/2013 PASARR number received from EDS on 04/26/2013  FL2 transmitted to all facilities in geographic area requested by pt/family on  04/26/2013 FL2 transmitted to all facilities within larger geographic area on 04/26/2013  Patient informed that his/her managed care company has contracts with or will negotiate with  certain facilities, including the following:     Patient/family informed of bed offers received:   Patient chooses bed at  Physician recommends and patient chooses bed at    Patient to be transferred to  on   Patient to be transferred to facility by   The following physician request were entered in Epic:   Additional Comments:   [place] 1-16;  6N1-16 Phone: [phone]

## 2013-04-26 NOTE — Progress Notes (Signed)
PT Cancellation Note  Patient Details Name: Tiffany Hood MRN: [id number] DOB: Nov 17, 1940   Cancelled Treatment:    Reason Eval/Treat Not Completed: Other (comment) (Pt NPO awaiting procedure and has been up ambulating in room, just got back into bed. States she feels too weak now but will ambulate later today after her procedure if she is feeling better.  PT to check back tomorrow. )   Melvern Banker 04/26/2013, 10:29 AM Lavonia Dana, PT  [phone] 04/26/2013

## 2013-04-26 NOTE — Progress Notes (Signed)
Subjective: No complaints  Objective: Vital signs in last 24 hours: Temp:  [98.1 F (36.7 C)-98.4 F (36.9 C)] 98.1 F (36.7 C) (01/28 2149) Pulse Rate:  [86-102] 86 (01/28 2149) Resp:  [16-20] 20 (01/28 2149) BP: (100-117)/(52-58) 100/52 mmHg (01/28 2149) SpO2:  [95 %-98 %] 96 % (01/28 2149) Last BM Date: 04/25/13  Intake/Output from previous day: 01/28 0701 - 01/29 0700 In: 302 [P.O.:222; I.V.:80] Out: 1150 [Urine:1150] Intake/Output this shift: Total I/O In: -  Out: 425 [Urine:425]  Resp: clear to auscultation bilaterally Cardio: regular rate and rhythm GI: soft, minimal tenderness. open wound relatively clean  Lab Results:   Recent Labs  04/24/13 0530  WBC 12.3*  HGB 9.2*  HCT 27.6*  PLT 354   BMET  Recent Labs  04/24/13 0530  NA 139  K 4.4  CL 101  CO2 25  GLUCOSE 104*  BUN 6  CREATININE 0.51  CALCIUM 8.4   PT/INR No results found for this basename: LABPROT, INR,  in the last 72 hours ABG No results found for this basename: PHART, PCO2, PO2, HCO3,  in the last 72 hours  Studies/Results: Ct Abdomen Pelvis W Contrast  04/25/2013   CLINICAL DATA:  Diverticulitis. Approximately 2 weeks status post sigmoid colectomy. Wound drainage, fever, and diarrhea.  EXAM: CT ABDOMEN AND PELVIS WITH CONTRAST  TECHNIQUE: Multidetector CT imaging of the abdomen and pelvis was performed using the standard protocol following bolus administration of intravenous contrast.  CONTRAST:  86mL OMNIPAQUE IOHEXOL 300 MG/ML  SOLN  COMPARISON:  11/06/2012  FINDINGS: Images through the lung bases show mild infiltrate or atelectasis in the posterior right lower lobe which is new since previous study. Pneumonia cannot be excluded.  Anastomotic staple seen at the rectosigmoid junction. Some extraluminal air is seen within the central sigmoid mesocolon and a right pelvic fluid collection containing an air-fluid level is seen which measures 2.9 x 5.3 cm on image 62. This is suspicious  for abscess. There is no evidence of oral contrast leak or extravasation. No evidence of free intraperitoneal air. No evidence of bowel obstruction  Gas is noted in the urinary bladder, but no definite fistula identified. This may be due to recent bladder catheterization. No other inflammatory process or abnormal fluid collections identified.  Tiny cyst seen in the anterior left hepatic lobe which is stable. No liver masses are identified. The gallbladder, pancreas, spleen, and adrenal glands are normal in appearance. Tiny renal cysts are again noted, however there is no evidence of a renal mass or hydronephrosis. No lymphadenopathy identified.  IMPRESSION: 5.3 cm right pelvic fluid collection, suspicious for postop abscess. Extraluminal gas also noted in sigmoid mesocolon, however no oral contrast extravasation visualized.  Small amount of gas noted in urinary bladder. Recommend clinical correlation for recent bladder catheterization. If none, a colo-vesicle fistula or gas-forming urinary tract infection would be considerations, and correlation with urinalysis suggested.  Mild infiltrate or atelectasis in posterior right lower lobe.   Electronically Signed   By: Earle Gell M.D.   On: 04/25/2013 14:01    Anti-infectives: Anti-infectives   Start     Dose/Rate Route Frequency Ordered Stop   04/25/13 2000  ciprofloxacin (CIPRO) tablet 500 mg     500 mg Oral 2 times daily 04/25/13 1647     04/21/13 1800  ceFAZolin (ANCEF) IVPB 1 g/50 mL premix  Status:  Discontinued     1 g 100 mL/hr over 30 Minutes Intravenous 3 times per day 04/21/13 1717 04/25/13 1744  Assessment/Plan: s/p * No surgery found * Continue abx Plan for perc drain of abscess today  LOS: 5 days    TOTH III,Tatsuya Okray S 04/26/2013

## 2013-04-26 NOTE — Procedures (Signed)
Interventional Radiology Procedure Note  Procedure: Placement of 63F right transgluteal drain.  50 mL purulent fluid aspirated and a sample sent for culture.  Complications: None Recommendations: - Maintain to gravity bag drainage - Flush TID - Recommend tube injection under fluoro prior to removal to exclude fistula   Signed,  Criselda Peaches, MD [facility]

## 2013-04-26 NOTE — H&P (Signed)
Tiffany Hood is an 73 y.o. female.   Chief Complaint: sigmoid colectomy for diverticulitis performed 1/16 Did well for days Returns to ER with abd pain; fever and incisional and vaginal drainage CT revelas pelvic collection Now scheduled for abscess drain placement  HPI: HTN; HLD; Breast Ca; DM; diverticulitis  Past Medical History  Diagnosis Date  . Hypertension   . Hyperlipidemia   . Osteoporosis     Fosamax Rxed 2/13  . Breast cancer left  . Diverticulitis     09/14/12  . Diverticulosis   . Personal history of colonic adenomas 11/15/2012  . Heart murmur   . Diabetes mellitus     TYPE 2    Past Surgical History  Procedure Laterality Date  . Abdominal hysterectomy  1965    ovaries remain; for dysfunctional menses; ? endometriosis  . Tonsillectomy and adenoidectomy    . Colonoscopy  2004     Tics  . Removal bone spur Left 2/14    2nd finger  . Colon polyps  2014    Dr Carlean Purl  . Breast lumpectomy Left   . Laparoscopic sigmoid colectomy  04/13/2013    DR TOTH  . Laparoscopic sigmoid colectomy N/A 04/13/2013    Procedure: LAPAROSCOPIC ASSISTED SIGMOID COLECTOMY;  Surgeon: Merrie Roof, MD;  Location: [facility];  Service: General;  Laterality: N/A;  . Abdominal surgery      Family History  Problem Relation Age of Onset  . Stroke Mother 58  . Hypertension Mother   . Lung cancer Father     smoker  . Hypertension Sister   . Hypertension Brother   . Stroke Brother 36  . Heart disease Brother 63  . Diabetes Neg Hx   . Colon cancer Neg Hx    Social History:  reports that she has never smoked. She has never used smokeless tobacco. She reports that she does not drink alcohol or use illicit drugs.  Allergies: No Known Allergies  Medications Prior to Admission  Medication Sig Dispense Refill  . alendronate (FOSAMAX) 70 MG tablet Take 70 mg by mouth every Saturday. Take with a full glass of water on an empty stomach.      Marland Kitchen aspirin 81 MG tablet Take 81 mg by mouth  every Monday, Wednesday, and Friday.       . Calcium Carbonate-Vitamin D (CALTRATE 600+D PO) Take 1 capsule by mouth daily.       . folic acid (FOLVITE) 1 MG tablet Take 1 mg by mouth daily.      Marland Kitchen losartan (COZAAR) 50 MG tablet Take 50 mg by mouth daily.      . methocarbamol (ROBAXIN) 500 MG tablet Take 250 mg by mouth every 8 (eight) hours as needed for muscle spasms.      . Multiple Vitamin (MULTIVITAMIN) tablet Take 1 tablet by mouth daily.        . Omega-3 Fatty Acids (FISH OIL) 1000 MG CAPS Take 1,000 mg by mouth daily.       Marland Kitchen oxyCODONE-acetaminophen (ROXICET) 5-325 MG per tablet Take 1-2 tablets by mouth every 4 (four) hours as needed for severe pain.  50 tablet  0  . Polyethyl Glycol-Propyl Glycol (SYSTANE) 0.4-0.3 % SOLN Place 1 drop into both eyes every morning.      . pravastatin (PRAVACHOL) 40 MG tablet Take 40 mg by mouth daily.      Marland Kitchen triamterene-hydrochlorothiazide (MAXZIDE) 75-50 MG per tablet Take 0.5 tablets by mouth daily.      Marland Kitchen  vitamin E 400 UNIT capsule Take 400 Units by mouth daily.          No results found for this or any previous visit (from the past 48 hour(s)). Ct Abdomen Pelvis W Contrast  04/25/2013   CLINICAL DATA:  Diverticulitis. Approximately 2 weeks status post sigmoid colectomy. Wound drainage, fever, and diarrhea.  EXAM: CT ABDOMEN AND PELVIS WITH CONTRAST  TECHNIQUE: Multidetector CT imaging of the abdomen and pelvis was performed using the standard protocol following bolus administration of intravenous contrast.  CONTRAST:  74mL OMNIPAQUE IOHEXOL 300 MG/ML  SOLN  COMPARISON:  11/06/2012  FINDINGS: Images through the lung bases show mild infiltrate or atelectasis in the posterior right lower lobe which is new since previous study. Pneumonia cannot be excluded.  Anastomotic staple seen at the rectosigmoid junction. Some extraluminal air is seen within the central sigmoid mesocolon and a right pelvic fluid collection containing an air-fluid level is seen which  measures 2.9 x 5.3 cm on image 62. This is suspicious for abscess. There is no evidence of oral contrast leak or extravasation. No evidence of free intraperitoneal air. No evidence of bowel obstruction  Gas is noted in the urinary bladder, but no definite fistula identified. This may be due to recent bladder catheterization. No other inflammatory process or abnormal fluid collections identified.  Tiny cyst seen in the anterior left hepatic lobe which is stable. No liver masses are identified. The gallbladder, pancreas, spleen, and adrenal glands are normal in appearance. Tiny renal cysts are again noted, however there is no evidence of a renal mass or hydronephrosis. No lymphadenopathy identified.  IMPRESSION: 5.3 cm right pelvic fluid collection, suspicious for postop abscess. Extraluminal gas also noted in sigmoid mesocolon, however no oral contrast extravasation visualized.  Small amount of gas noted in urinary bladder. Recommend clinical correlation for recent bladder catheterization. If none, a colo-vesicle fistula or gas-forming urinary tract infection would be considerations, and correlation with urinalysis suggested.  Mild infiltrate or atelectasis in posterior right lower lobe.   Electronically Signed   By: Earle Gell M.D.   On: 04/25/2013 14:01    Review of Systems  Constitutional: Positive for weight loss. Negative for fever.  Respiratory: Negative for shortness of breath.   Gastrointestinal: Positive for nausea and abdominal pain. Negative for vomiting.  Neurological: Positive for weakness.    Blood pressure 101/78, pulse 72, temperature 97.8 F (36.6 C), temperature source Oral, resp. rate 18, height 5\' 2"  (1.575 m), weight 156 lb 12 oz (71.1 kg), SpO2 97.00%. Physical Exam  Constitutional: She is oriented to person, place, and time. She appears well-nourished.  Cardiovascular: Normal rate, regular rhythm and normal heart sounds.   No murmur heard. Respiratory: Effort normal and breath  sounds normal. She has no wheezes.  GI: Soft. She exhibits distension. There is tenderness.  Musculoskeletal: Normal range of motion.  Neurological: She is alert and oriented to person, place, and time.  Skin: Skin is warm and dry.  Psychiatric: She has a normal mood and affect. Her behavior is normal. Judgment and thought content normal.     Assessment/Plan Pelvic abscess post sigmoid colectomy 1/16 Now scheduled for abscess drain placement Pt aware of procedure benefits and risks and agreeable to proceed Consent signed and in chart  Lisanne Ponce A 04/26/2013, 7:40 AM

## 2013-04-26 NOTE — Progress Notes (Signed)
Clinical Social Work Department BRIEF PSYCHOSOCIAL ASSESSMENT 04/26/2013  Patient:  Tiffany Hood, Tiffany Hood     Account Number:  [id number]     Admit date:  04/21/2013  Clinical Social Worker:  Pete Pelt, CLINICAL SOCIAL WORKER  Date/Time:  04/26/2013 04:01 PM  Referred by:  Physician  Date Referred:  04/24/2013 Referred for  SNF Placement   Other Referral:   Interview type:  Patient Other interview type:    PSYCHOSOCIAL DATA Living Status:  ALONE Admitted from facility:   Level of care:   Primary support name:  Suprina Mandeville  [phone] Primary support relationship to patient:  CHILD, ADULT Degree of support available:   Pt has some support from her family and friends.    CURRENT CONCERNS Current Concerns  Post-Acute Placement   Other Concerns:    SOCIAL WORK ASSESSMENT / PLAN CSW met with the Pt at the bedside to discuss d/c planning to SNF. Pt is alert and oriented and able to participate with assessment. Pt was in good spirits as displayed by joking with CSW. CSW informed Pt that a consult was placed for SNF and that CSW was informed that Pt would like to go to [facility]. Pt verified that she would like to go to that facility as the facility is close to Pt home. CSW requested permission to send information out to other facilities in the [facility] area in the event the chosen facility would not be able to make a bed offer. Pt gave verbal permission to fax Pt information to [facility] and CSW will provide offers to Pt in the a.m.   Assessment/plan status:  Information/Referral to [employer] Other assessment/ plan:   Information/referral to community resources:   CSW provided Pt with a SNF listing in the surrounding areas.    PATIENT'S/FAMILY'S RESPONSE TO PLAN OF CARE: Pt was appreciative for assistance with SNF placement and is excited about contuing her recovery. CSW will continue to follow with Pt for d/c planning.      [facility]  4N 1-16;  [phone] Phone: [phone]

## 2013-04-27 ENCOUNTER — Encounter (INDEPENDENT_AMBULATORY_CARE_PROVIDER_SITE_OTHER): Payer: Medicare Other | Admitting: General Surgery

## 2013-04-27 LAB — BASIC METABOLIC PANEL
BUN: 6 mg/dL (ref 6–23)
CHLORIDE: 98 meq/L (ref 96–112)
CO2: 25 mEq/L (ref 19–32)
CREATININE: 0.56 mg/dL (ref 0.50–1.10)
Calcium: 9 mg/dL (ref 8.4–10.5)
GFR calc non Af Amer: >90 mL/min (ref >90)
Glucose, Bld: 153 mg/dL — ABNORMAL HIGH (ref 70–99)
POTASSIUM: 4.2 meq/L (ref 3.7–5.3)
Sodium: 137 mEq/L (ref 137–147)

## 2013-04-27 LAB — CBC WITH DIFFERENTIAL/PLATELET
BASOS ABS: 0 10*3/uL (ref 0.0–0.1)
BASOS PCT: 0 % (ref 0–1)
EOS ABS: 0.6 10*3/uL (ref 0.0–0.7)
Eosinophils Relative: 5 % (ref 0–5)
HCT: 30.5 % — ABNORMAL LOW (ref 36.0–46.0)
HEMOGLOBIN: 10.1 g/dL — AB (ref 12.0–15.0)
Lymphocytes Relative: 11 % — ABNORMAL LOW (ref 12–46)
Lymphs Abs: 1.3 10*3/uL (ref 0.7–4.0)
MCH: 28.6 pg (ref 26.0–34.0)
MCHC: 33.1 g/dL (ref 30.0–36.0)
MCV: 86.4 fL (ref 78.0–100.0)
Monocytes Absolute: 1.1 10*3/uL — ABNORMAL HIGH (ref 0.1–1.0)
Monocytes Relative: 10 % (ref 3–12)
NEUTROS ABS: 8.6 10*3/uL — AB (ref 1.7–7.7)
NEUTROS PCT: 74 % (ref 43–77)
Platelets: 555 10*3/uL — ABNORMAL HIGH (ref 150–400)
RBC: 3.53 MIL/uL — ABNORMAL LOW (ref 3.87–5.11)
RDW: 14.8 % (ref 11.5–15.5)
WBC: 11.6 10*3/uL — ABNORMAL HIGH (ref 4.0–10.5)

## 2013-04-27 LAB — CULTURE, BLOOD (ROUTINE X 2)
Culture: NO GROWTH
Culture: NO GROWTH

## 2013-04-27 NOTE — Progress Notes (Signed)
Physical Therapy Treatment Patient Details Name: Tiffany Hood MRN: [id number] DOB: 12/31/1940 Today's Date: 04/27/2013 Time: 1150-1205 PT Time Calculation (min): 15 min  PT Assessment / Plan / Recommendation  History of Present Illness pt presents with recent Lap Chole and now with wound infection.     PT Comments   Pt reported she has been walking to the bathroom every hour to void. Pt reports pain in her buttock region due to the drain. Pt declined OOB with PT due to this discomfort. Pt agreeable to do bed exercises today. Pt has been progressing towards goals and do recommend continued PT to maximize mobility and independence.  Follow Up Recommendations  SNF     Does the patient have the potential to tolerate intense rehabilitation     Barriers to Discharge        Equipment Recommendations  None recommended by PT    Recommendations for Other Services    Frequency Min 3X/week   Progress towards PT Goals Progress towards PT goals: Progressing toward goals  Plan Current plan remains appropriate    Precautions / Restrictions Precautions Precautions: Other (comment) (drain) Restrictions Weight Bearing Restrictions: No   Pertinent Vitals/Pain     Mobility       Exercises General Exercises - Lower Extremity Ankle Circles/Pumps: AROM;Strengthening;Both;20 reps;Supine Quad Sets: AROM;Strengthening;Both;10 reps;Supine Heel Slides: AROM;Strengthening;Both;10 reps;Supine Hip ABduction/ADduction: AROM;Strengthening;Both;10 reps;Supine Straight Leg Raises: AROM;Strengthening;Both;10 reps;Supine   PT Diagnosis:    PT Problem List:   PT Treatment Interventions:     PT Goals (current goals can now be found in the care plan section)    Visit Information  Last PT Received On: 04/27/13 Assistance Needed: +1 History of Present Illness: pt presents with recent Lap Chole and now with wound infection.      Subjective Data      Cognition  Cognition Arousal/Alertness:  Awake/alert Behavior During Therapy: WFL for tasks assessed/performed Overall Cognitive Status: Within Functional Limits for tasks assessed    Balance     End of Session PT - End of Session Activity Tolerance: Patient limited by pain (pt declined OOB due to buttock pain from drain.) Patient left: in bed;with call bell/phone within reach Nurse Communication: Mobility status   GP     Tiffany Hood 04/27/2013, 12:06 PM

## 2013-04-27 NOTE — Progress Notes (Signed)
   CSW provided list of bed offers for SNF facilities.    CSW to f/u for bed selection and further d/c planning.    [facility]  [address];  [phone] Phone: [phone]

## 2013-04-27 NOTE — Progress Notes (Signed)
Subjective: She continue to feel better. No complaints other than some soreness at the drain site  Objective: Vital signs in last 24 hours: Temp:  [97.9 F (36.6 C)-98.2 F (36.8 C)] 97.9 F (36.6 C) (01/30 0300) Pulse Rate:  [72-87] 87 (01/30 1026) Resp:  [16-18] 16 (01/30 1026) BP: (98-117)/(50-61) 117/50 mmHg (01/30 1026) SpO2:  [97 %-100 %] 97 % (01/30 0300) Last BM Date: 04/25/13  Intake/Output from previous day: 01/29 0701 - 01/30 0700 In: 2062.5 [P.O.:420; I.V.:1632.5] Out: 710 [Urine:700; Drains:10] Intake/Output this shift: Total I/O In: -  Out: 1000 [Urine:1000]  Resp: clear to auscultation bilaterally Cardio: regular rate and rhythm GI: soft, nontender. wound clean. drain output brown liquid  Lab Results:   Recent Labs  04/27/13 1020  WBC 11.6*  HGB 10.1*  HCT 30.5*  PLT 555*   BMET  Recent Labs  04/27/13 1020  NA 137  K 4.2  CL 98  CO2 25  GLUCOSE 153*  BUN 6  CREATININE 0.56  CALCIUM 9.0   PT/INR  Recent Labs  04/26/13 0845  LABPROT 13.6  INR 1.06   ABG No results found for this basename: PHART, PCO2, PO2, HCO3,  in the last 72 hours  Studies/Results: Ct Image Guided Drainage Percut Cath  Peritoneal Retroperit  04/26/2013   CLINICAL DATA:  73 year old female with a recent sigmoid colectomy for diverticulitis on April 13, 2013. She is now admitted with fever and abdominal pain. CT scan reveals fluid and gas collection adjacent to the rectosigmoid anastomosis concerning for abscess. Will proceed with percutaneous drain placement.  EXAM: CT IMAGE GUIDED DRAINAGE PERCUT CATH  PERITONEAL RETROPERIT  Date: 04/26/2013  TECHNIQUE: Informed consent was obtained from the patient following explanation of the procedure, risks, benefits and alternatives. The patient understands, agrees and consents for the procedure. All questions were addressed. A time out was performed.  Maximal barrier sterile technique utilized including caps, mask, sterile  gowns, sterile gloves, large sterile drape, hand hygiene, and Betadine skin prep.  A planning axial CT scan was performed. The fluid and gas collection adjacent to the rectosigmoid anastomosis was successfully identified. A suitable skin entry site was selected and marked. Local anesthesia was attained by infiltration with 1% lidocaine. A small dermatotomy was made under CT fluoroscopic guidance, an 18 gauge trocar needle was carefully advanced through the right piriformis muscle adjacent to the sacrum an into the fluid collection. A 0.035 wire was coiled within the collection and the tract serially dilated to 10 [place]. A Cook 10 [place] all-purpose drainage catheter was then advanced over the wire and formed. Approximately 50 mL of mildly bloody purulent/feculent material was successfully aspirated. A final axial CT image demonstrates good position of the drain in the superior aspect of the fluid and gas collection.  The tube was connected to gravity bag drainage and secured to the skin with 0 Prolene suture and a sterile bandage.  ANESTHESIA/SEDATION: Moderate (conscious) sedation was used. Three mg Versed, 150 mcg Fentanyl were administered intravenously. The patient's vital signs were monitored continuously by radiology nursing throughout the procedure.  Sedation Time: 25 minutes  PROCEDURE: 1. CT-guided placement of a percutaneous drain Interventional Radiologist:  Criselda Peaches, MD  IMPRESSION: Successful placement of a 33 French percutaneous drain into the perianastamotic fluid and gas collection. 50 mL of purulent/feculent material was aspirated. A sample was sent for culture. The drain is left connected to gravity drainage to minimize the risk for fistula formation.  Recommend contrast injection under fluoroscopy prior  to tube removal to exclude the presence of a fistulous connection with the adjacent surgical anastomosis.  Signed,  Criselda Peaches, MD  [facility]ascular [facility] Inter[facility] Radiology  Specialists  [facility]   Electronically Signed   By: Jacqulynn Cadet M.D.   On: 04/26/2013 14:03    Anti-infectives: Anti-infectives   Start     Dose/Rate Route Frequency Ordered Stop   04/25/13 2000  ciprofloxacin (CIPRO) tablet 500 mg     500 mg Oral 2 times daily 04/25/13 1647     04/21/13 1800  ceFAZolin (ANCEF) IVPB 1 g/50 mL premix  Status:  Discontinued     1 g 100 mL/hr over 30 Minutes Intravenous 3 times per day 04/21/13 1717 04/25/13 1744      Assessment/Plan: s/p * No surgery found * Advance diet Continue cipro until sensitivities are back Plan for SNF probably next week  LOS: 6 days    TOTH III,PAUL S 04/27/2013

## 2013-04-27 NOTE — Progress Notes (Signed)
Subjective: TG pelvic abscess drain placed 1/29 Pt feels some better Up in bed  Objective: Vital signs in last 24 hours: Temp:  [97.9 F (36.6 C)-98.2 F (36.8 C)] 97.9 F (36.6 C) (01/30 0300) Pulse Rate:  [72-87] 80 (01/30 0300) Resp:  [10-18] 16 (01/30 0300) BP: (94-116)/(49-64) 98/50 mmHg (01/30 0300) SpO2:  [92 %-100 %] 97 % (01/30 0300) Last BM Date: 04/25/13  Intake/Output from previous day: 01/29 0701 - 01/30 0700 In: 2062.5 [P.O.:420; I.V.:1632.5] Out: 710 [Urine:700; Drains:10] Intake/Output this shift: Total I/O In: -  Out: 1000 [Urine:1000]  PE:  Afeb; vss Output bloody with pus: 10 cc recorded/ 10 cc in bag Site clean and dry Tender to touch site Cx pending   Lab Results:  No results found for this basename: WBC, HGB, HCT, PLT,  in the last 72 hours BMET No results found for this basename: NA, K, CL, CO2, GLUCOSE, BUN, CREATININE, CALCIUM,  in the last 72 hours PT/INR  Recent Labs  04/26/13 0845  LABPROT 13.6  INR 1.06   ABG No results found for this basename: PHART, PCO2, PO2, HCO3,  in the last 72 hours  Studies/Results: Ct Abdomen Pelvis W Contrast  04/25/2013   CLINICAL DATA:  Diverticulitis. Approximately 2 weeks status post sigmoid colectomy. Wound drainage, fever, and diarrhea.  EXAM: CT ABDOMEN AND PELVIS WITH CONTRAST  TECHNIQUE: Multidetector CT imaging of the abdomen and pelvis was performed using the standard protocol following bolus administration of intravenous contrast.  CONTRAST:  39mL OMNIPAQUE IOHEXOL 300 MG/ML  SOLN  COMPARISON:  11/06/2012  FINDINGS: Images through the lung bases show mild infiltrate or atelectasis in the posterior right lower lobe which is new since previous study. Pneumonia cannot be excluded.  Anastomotic staple seen at the rectosigmoid junction. Some extraluminal air is seen within the central sigmoid mesocolon and a right pelvic fluid collection containing an air-fluid level is seen which measures 2.9 x 5.3  cm on image 62. This is suspicious for abscess. There is no evidence of oral contrast leak or extravasation. No evidence of free intraperitoneal air. No evidence of bowel obstruction  Gas is noted in the urinary bladder, but no definite fistula identified. This may be due to recent bladder catheterization. No other inflammatory process or abnormal fluid collections identified.  Tiny cyst seen in the anterior left hepatic lobe which is stable. No liver masses are identified. The gallbladder, pancreas, spleen, and adrenal glands are normal in appearance. Tiny renal cysts are again noted, however there is no evidence of a renal mass or hydronephrosis. No lymphadenopathy identified.  IMPRESSION: 5.3 cm right pelvic fluid collection, suspicious for postop abscess. Extraluminal gas also noted in sigmoid mesocolon, however no oral contrast extravasation visualized.  Small amount of gas noted in urinary bladder. Recommend clinical correlation for recent bladder catheterization. If none, a colo-vesicle fistula or gas-forming urinary tract infection would be considerations, and correlation with urinalysis suggested.  Mild infiltrate or atelectasis in posterior right lower lobe.   Electronically Signed   By: Earle Gell M.D.   On: 04/25/2013 14:01   Ct Image Guided Drainage Percut Cath  Peritoneal Retroperit  04/26/2013   CLINICAL DATA:  73 year old female with a recent sigmoid colectomy for diverticulitis on April 13, 2013. She is now admitted with fever and abdominal pain. CT scan reveals fluid and gas collection adjacent to the rectosigmoid anastomosis concerning for abscess. Will proceed with percutaneous drain placement.  EXAM: CT IMAGE GUIDED DRAINAGE PERCUT CATH  PERITONEAL RETROPERIT  Date: 04/26/2013  TECHNIQUE: Informed consent was obtained from the patient following explanation of the procedure, risks, benefits and alternatives. The patient understands, agrees and consents for the procedure. All questions were  addressed. A time out was performed.  Maximal barrier sterile technique utilized including caps, mask, sterile gowns, sterile gloves, large sterile drape, hand hygiene, and Betadine skin prep.  A planning axial CT scan was performed. The fluid and gas collection adjacent to the rectosigmoid anastomosis was successfully identified. A suitable skin entry site was selected and marked. Local anesthesia was attained by infiltration with 1% lidocaine. A small dermatotomy was made under CT fluoroscopic guidance, an 18 gauge trocar needle was carefully advanced through the right piriformis muscle adjacent to the sacrum an into the fluid collection. A 0.035 wire was coiled within the collection and the tract serially dilated to 10 [place]. A Cook 10 [place] all-purpose drainage catheter was then advanced over the wire and formed. Approximately 50 mL of mildly bloody purulent/feculent material was successfully aspirated. A final axial CT image demonstrates good position of the drain in the superior aspect of the fluid and gas collection.  The tube was connected to gravity bag drainage and secured to the skin with 0 Prolene suture and a sterile bandage.  ANESTHESIA/SEDATION: Moderate (conscious) sedation was used. Three mg Versed, 150 mcg Fentanyl were administered intravenously. The patient's vital signs were monitored continuously by radiology nursing throughout the procedure.  Sedation Time: 25 minutes  PROCEDURE: 1. CT-guided placement of a percutaneous drain Interventional Radiologist:  Criselda Peaches, MD  IMPRESSION: Successful placement of a 23 French percutaneous drain into the perianastamotic fluid and gas collection. 50 mL of purulent/feculent material was aspirated. A sample was sent for culture. The drain is left connected to gravity drainage to minimize the risk for fistula formation.  Recommend contrast injection under fluoroscopy prior to tube removal to exclude the presence of a fistulous connection with the  adjacent surgical anastomosis.  Signed,  Criselda Peaches, MD  [facility]  [facility]iology   Electronically Signed   By: Jacqulynn Cadet M.D.   On: 04/26/2013 14:03    Anti-infectives: Anti-infectives   Start     Dose/Rate Route Frequency Ordered Stop   04/25/13 2000  ciprofloxacin (CIPRO) tablet 500 mg     500 mg Oral 2 times daily 04/25/13 1647     04/21/13 1800  ceFAZolin (ANCEF) IVPB 1 g/50 mL premix  Status:  Discontinued     1 g 100 mL/hr over 30 Minutes Intravenous 3 times per day 04/21/13 1717 04/25/13 1744      Assessment/Plan: s/p * No surgery found *  Pelvic abscess drain placed 1/29 Doing better Drain to remain til output is minimal Will need injection before pull Plan per [facility]   LOS: 6 days    Tiffany Hood A 04/27/2013

## 2013-04-28 MED ORDER — ENOXAPARIN SODIUM 40 MG/0.4ML ~~LOC~~ SOLN
40.0000 mg | SUBCUTANEOUS | Status: DC
  Administered 2013-04-28 – 2013-05-07 (×10): 40 mg via SUBCUTANEOUS
  Filled 2013-04-28 (×13): qty 0.4

## 2013-04-28 NOTE — Progress Notes (Signed)
Subjective Patient is s/p TG drain placed 1/29 for pelvic abscess post operative lap sigmoid colectomy 04/03/13. She states she is feeling ok today, she has good days and bad days no real consistency. She states her pain is minimal and she has been able to walk the halls which she enjoys. She is on a regular diet and tolerating without any N/V. She denies any fever or chills.   Objective: Physical Exam: BP 110/55  Pulse 79  Temp(Src) 98.4 F (36.9 C) (Oral)  Resp 14  Ht 5\' 2"  (1.575 m)  Wt 156 lb 12 oz (71.1 kg)  BMI 28.66 kg/m2  SpO2 96%  General: A&Ox3, NAD, sitting up in chair Abd: surgical wound dressing C/D/I, soft, ND (+) BS, right TG drain dressing C/D/I, output brown purulent minimal output.   Labs: CBC  Recent Labs  04/27/13 1020  WBC 11.6*  HGB 10.1*  HCT 30.5*  PLT 555*   BMET  Recent Labs  04/27/13 1020  NA 137  K 4.2  CL 98  CO2 25  GLUCOSE 153*  BUN 6  CREATININE 0.56  CALCIUM 9.0   LFT No results found for this basename: PROT, ALBUMIN, AST, ALT, ALKPHOS, BILITOT, BILIDIR, IBILI, LIPASE,  in the last 72 hours PT/INR  Recent Labs  04/26/13 0845  LABPROT 13.6  INR 1.06     Studies/Results: Ct Image Guided Drainage Percut Cath  Peritoneal Retroperit  04/26/2013   CLINICAL DATA:  73 year old female with a recent sigmoid colectomy for diverticulitis on April 13, 2013. She is now admitted with fever and abdominal pain. CT scan reveals fluid and gas collection adjacent to the rectosigmoid anastomosis concerning for abscess. Will proceed with percutaneous drain placement.  EXAM: CT IMAGE GUIDED DRAINAGE PERCUT CATH  PERITONEAL RETROPERIT  Date: 04/26/2013  TECHNIQUE: Informed consent was obtained from the patient following explanation of the procedure, risks, benefits and alternatives. The patient understands, agrees and consents for the procedure. All questions were addressed. A time out was performed.  Maximal barrier sterile technique utilized  including caps, mask, sterile gowns, sterile gloves, large sterile drape, hand hygiene, and Betadine skin prep.  A planning axial CT scan was performed. The fluid and gas collection adjacent to the rectosigmoid anastomosis was successfully identified. A suitable skin entry site was selected and marked. Local anesthesia was attained by infiltration with 1% lidocaine. A small dermatotomy was made under CT fluoroscopic guidance, an 18 gauge trocar needle was carefully advanced through the right piriformis muscle adjacent to the sacrum an into the fluid collection. A 0.035 wire was coiled within the collection and the tract serially dilated to 10 [place]. A Cook 10 [place] all-purpose drainage catheter was then advanced over the wire and formed. Approximately 50 mL of mildly bloody purulent/feculent material was successfully aspirated. A final axial CT image demonstrates good position of the drain in the superior aspect of the fluid and gas collection.  The tube was connected to gravity bag drainage and secured to the skin with 0 Prolene suture and a sterile bandage.  ANESTHESIA/SEDATION: Moderate (conscious) sedation was used. Three mg Versed, 150 mcg Fentanyl were administered intravenously. The patient's vital signs were monitored continuously by radiology nursing throughout the procedure.  Sedation Time: 25 minutes  PROCEDURE: 1. CT-guided placement of a percutaneous drain Interventional Radiologist:  Criselda Peaches, MD  IMPRESSION: Successful placement of a 55 French percutaneous drain into the perianastamotic fluid and gas collection. 50 mL of purulent/feculent material was aspirated. A sample was sent for  culture. The drain is left connected to gravity drainage to minimize the risk for fistula formation.  Recommend contrast injection under fluoroscopy prior to tube removal to exclude the presence of a fistulous connection with the adjacent surgical anastomosis.  Signed,  Criselda Peaches, MD  [facility]  Inter[facility] Radiology Special[facility]  [facility] Radiology   Electronically Signed   By: Jacqulynn Cadet M.D.   On: 04/26/2013 14:03    Assessment/Plan: Pelvic abscess s/p TG drain placed, purulent output minimal, will need drain injection prior to removal after repeat CT. Wbc trending down, afebrile, pain controlled.  All other plans per [facility].   LOS: 7 days    Hedy Jacob PA-C 04/28/2013 11:07 AM

## 2013-04-28 NOTE — Progress Notes (Signed)
Subjective: Doing well on full liquids drainage from IR drain is clear.  Objective: Vital signs in last 24 hours: Temp:  [98.4 F (36.9 C)-99.2 F (37.3 C)] 98.4 F (36.9 C) (01/31 0628) Pulse Rate:  [79-87] 79 (01/31 0628) Resp:  [14-16] 14 (01/31 0628) BP: (110-117)/(50-61) 110/55 mmHg (01/31 0628) SpO2:  [93 %-96 %] 96 % (01/31 0628) Last BM Date: 04/27/13 Tm 99.2, VSS Labs OK WBC is coming down. Intake/Output from previous day: 01/30 0701 - 01/31 0700 In: 852.8 [I.V.:847.8] Out: 2005 [Urine:2000; Drains:5] Intake/Output this shift:    General appearance: alert, cooperative and no distress Resp: clear to auscultation bilaterally GI: soft + BS, no distension, taking full liquids.  Open wound is clean some necrotic stuff still at base and the 2 open sites communicate at the base. I have redressed.  Lab Results:   Recent Labs  04/27/13 1020  WBC 11.6*  HGB 10.1*  HCT 30.5*  PLT 555*    BMET  Recent Labs  04/27/13 1020  NA 137  K 4.2  CL 98  CO2 25  GLUCOSE 153*  BUN 6  CREATININE 0.56  CALCIUM 9.0   PT/INR  Recent Labs  04/26/13 0845  LABPROT 13.6  INR 1.06    No results found for this basename: AST, ALT, ALKPHOS, BILITOT, PROT, ALBUMIN,  in the last 168 hours   Lipase  No results found for this basename: lipase     Studies/Results: Ct Image Guided Drainage Percut Cath  Peritoneal Retroperit  04/26/2013   CLINICAL DATA:  73 year old female with a recent sigmoid colectomy for diverticulitis on April 13, 2013. She is now admitted with fever and abdominal pain. CT scan reveals fluid and gas collection adjacent to the rectosigmoid anastomosis concerning for abscess. Will proceed with percutaneous drain placement.  EXAM: CT IMAGE GUIDED DRAINAGE PERCUT CATH  PERITONEAL RETROPERIT  Date: 04/26/2013  TECHNIQUE: Informed consent was obtained from the patient following explanation of the procedure, risks, benefits and alternatives. The patient  understands, agrees and consents for the procedure. All questions were addressed. A time out was performed.  Maximal barrier sterile technique utilized including caps, mask, sterile gowns, sterile gloves, large sterile drape, hand hygiene, and Betadine skin prep.  A planning axial CT scan was performed. The fluid and gas collection adjacent to the rectosigmoid anastomosis was successfully identified. A suitable skin entry site was selected and marked. Local anesthesia was attained by infiltration with 1% lidocaine. A small dermatotomy was made under CT fluoroscopic guidance, an 18 gauge trocar needle was carefully advanced through the right piriformis muscle adjacent to the sacrum an into the fluid collection. A 0.035 wire was coiled within the collection and the tract serially dilated to 10 [place]. A Cook 10 [place] all-purpose drainage catheter was then advanced over the wire and formed. Approximately 50 mL of mildly bloody purulent/feculent material was successfully aspirated. A final axial CT image demonstrates good position of the drain in the superior aspect of the fluid and gas collection.  The tube was connected to gravity bag drainage and secured to the skin with 0 Prolene suture and a sterile bandage.  ANESTHESIA/SEDATION: Moderate (conscious) sedation was used. Three mg Versed, 150 mcg Fentanyl were administered intravenously. The patient's vital signs were monitored continuously by radiology nursing throughout the procedure.  Sedation Time: 25 minutes  PROCEDURE: 1. CT-guided placement of a percutaneous drain Interventional Radiologist:  Tiffany Peaches, MD  IMPRESSION: Successful placement of a 46 French percutaneous drain into the perianastamotic  fluid and gas collection. 50 mL of purulent/feculent material was aspirated. A sample was sent for culture. The drain is left connected to gravity drainage to minimize the risk for fistula formation.  Recommend contrast injection under fluoroscopy prior to  tube removal to exclude the presence of a fistulous connection with the adjacent surgical anastomosis.  Signed,  Tiffany Peaches, MD  [facility]iology [facility]  [facility] Radiology   Electronically Signed   By: Tiffany Hood M.D.   On: 04/26/2013 14:03    Medications: . aspirin EC  81 mg Oral Q M,W,F  . ciprofloxacin  500 mg Oral BID  . folic acid  1 mg Oral Daily  . losartan  50 mg Oral Daily  . simvastatin  40 mg Oral q1800  . triamterene-hydrochlorothiazide  1 tablet Oral Daily    Assessment/Plan Diverticulosis/colonic ademomas;  S/p lap sigmoid colectomy 04/13/13 Dr. Marlou Hood Readmitted on 04/26/13 with 5.3 cm post op pelvic abscess, s/p transgluteal drain placement 04/26/13, by IR. Hypertension AODM 4.5 days of Cefazolin, now no Cipro day 4. Hx of breast cancer (left) 2004  Plan:  Continue current rx, add SCD/lovenox for DVT.           LOS: 7 days    Tiffany Hood 04/28/2013

## 2013-04-28 NOTE — Progress Notes (Signed)
Patient seen and examined.  Agree with PA's note. Drain output purulent.  Will need drain injection prior to its removal.

## 2013-04-29 LAB — CULTURE, ROUTINE-ABSCESS

## 2013-04-29 NOTE — Progress Notes (Signed)
Physical Therapy Discharge Patient Details Name: Tiffany Hood MRN: [id number] DOB: May 11, 1940 Today's Date: 04/29/2013 Time: 3790-2409 PT Time Calculation (min): 26 min  Patient discharged from PT services secondary to goals met and no further PT needs identified.  Please see latest therapy progress note for current level of functioning and progress toward goals.    Progress and discharge plan discussed with patient and/or caregiver: Patient/Caregiver agrees with plan  GP     Herbie Drape 04/29/2013, 12:05 PM

## 2013-04-29 NOTE — Progress Notes (Signed)
Subjective: Had IV infiltrate in RUE.  Objective: Vital signs in last 24 hours: Temp:  [97.8 F (36.6 C)-98.4 F (36.9 C)] 98.3 F (36.8 C) (02/01 0541) Pulse Rate:  [75-87] 75 (02/01 0541) Resp:  [16-17] 17 (02/01 0541) BP: (131-138)/(56-60) 131/56 mmHg (02/01 0541) SpO2:  [97 %-100 %] 98 % (02/01 0541) Last BM Date: 04/29/13  Intake/Output from previous day: 01/31 0701 - 02/01 0700 In: 5  Out: 5 [Drains:5] Intake/Output this shift: Total I/O In: 360 [P.O.:360] Out: -   PE: General- In NAD Abdomen-Soft, trocar site incisions with staples in, open areas of wound clean with no purulent drainage Light brown perc drain output  Lab Results:   Recent Labs  04/27/13 1020  WBC 11.6*  HGB 10.1*  HCT 30.5*  PLT 555*   BMET  Recent Labs  04/27/13 1020  NA 137  K 4.2  CL 98  CO2 25  GLUCOSE 153*  BUN 6  CREATININE 0.56  CALCIUM 9.0   PT/INR No results found for this basename: LABPROT, INR,  in the last 72 hours Comprehensive Metabolic Panel:    Component Value Date/Time   NA 137 04/27/2013 1020   NA 139 04/24/2013 0530   NA 142 10/30/2012 1325   K 4.2 04/27/2013 1020   K 4.4 04/24/2013 0530   K 4.3 10/30/2012 1325   CL 98 04/27/2013 1020   CL 101 04/24/2013 0530   CO2 25 04/27/2013 1020   CO2 25 04/24/2013 0530   CO2 28 10/30/2012 1325   BUN 6 04/27/2013 1020   BUN 6 04/24/2013 0530   BUN 19.9 10/30/2012 1325   CREATININE 0.56 04/27/2013 1020   CREATININE 0.51 04/24/2013 0530   CREATININE 0.7 10/30/2012 1325   GLUCOSE 153* 04/27/2013 1020   GLUCOSE 104* 04/24/2013 0530   GLUCOSE 87 10/30/2012 1325   CALCIUM 9.0 04/27/2013 1020   CALCIUM 8.4 04/24/2013 0530   CALCIUM 10.6* 10/30/2012 1325   AST 21 10/30/2012 1325   AST 14 01/19/2012 1220   AST 16 09/16/2011 1107   ALT 19 10/30/2012 1325   ALT 19 01/19/2012 1220   ALT 16 09/16/2011 1107   ALKPHOS 71 10/30/2012 1325   ALKPHOS 61 01/19/2012 1220   ALKPHOS 71 09/16/2011 1107   BILITOT 0.30 10/30/2012 1325   BILITOT 0.4  01/19/2012 1220   BILITOT 0.4 09/16/2011 1107   PROT 7.9 10/30/2012 1325   PROT 6.4 01/19/2012 1220   PROT 6.6 09/16/2011 1107   ALBUMIN 3.7 10/30/2012 1325   ALBUMIN 3.4* 01/19/2012 1220   ALBUMIN 4.1 09/16/2011 1107     Studies/Results: No results found.  Anti-infectives: Anti-infectives   Start     Dose/Rate Route Frequency Ordered Stop   04/25/13 2000  ciprofloxacin (CIPRO) tablet 500 mg     500 mg Oral 2 times daily 04/25/13 1647     04/21/13 1800  ceFAZolin (ANCEF) IVPB 1 g/50 mL premix  Status:  Discontinued     1 g 100 mL/hr over 30 Minutes Intravenous 3 times per day 04/21/13 1717 04/25/13 1744      Assessment 1.  S/p lap sigmoid colectomy 04/13/13 Dr. Marlou Starks; Readmitted on 04/26/13 with 5.3 cm post op pelvic abscess-Citrobacter freundii-sensitive to Cipro, s/p transgluteal drain placement 04/26/13, by IR.  2.  Open abdominal wound Hypertension  AODM    LOS: 8 days   Plan: Hot pad for IV infiltration site.  Continue wound care and Cipro.  Drain injection prior to removal.   Hazel Wrinkle J  04/29/2013   

## 2013-04-29 NOTE — Progress Notes (Signed)
Physical Therapy Treatment Patient Details Name: Tiffany Hood MRN: [id number] DOB: 1941-02-02 Today's Date: 04/29/2013 Time: 1540-0867 PT Time Calculation (min): 26 min  PT Assessment / Plan / Recommendation  History of Present Illness pt presents with recent Lap Chole and now with wound infection.     PT Comments   Pt reporting feeling much better, now independent with all functional mobility tasks including stair climbing.  Discussed with patient options for nursing care at d/c to include SNF vs. HH and recommend physician and/or RNCM discuss with patient whether [facility] could meet nursing needs.  Pt does not have any skilled PT needs at this point and expect she will transition back to community-level activity once wound healed and infection controlled.  Recommend pt continue to ambulate in hallways daily and had demonstrated safe, independent level of function.  No further PT needs, discharging from care.   Follow Up Recommendations  No PT follow up     Does the patient have the potential to tolerate intense rehabilitation     Barriers to Discharge        Equipment Recommendations  None recommended by PT    Recommendations for Other Services    Frequency     Progress towards PT Goals Progress towards PT goals: Goals met/education completed, patient discharged from PT  Plan Discharge plan needs to be updated    Precautions / Restrictions Precautions Precautions: Other (comment)   Pertinent Vitals/Pain Minimal, no s/s pain on observation    Mobility  Bed Mobility Overal bed mobility: Independent Transfers Overall transfer level: Independent Equipment used: None Transfers: Sit to/from Stand Sit to Stand: Independent Ambulation/Gait Ambulation/Gait assistance: Independent Ambulation Distance (Feet): 300 Feet (Pt routinely making multiple laps around unit) Assistive device: None Gait Pattern/deviations: WFL(Within Functional Limits) Gait velocity: unmeasured, subjectively  normal Gait velocity interpretation: at or above normal speed for age/gender Stairs: Yes Stairs assistance: Independent Stair Management: One rail Left;Forwards;Step to pattern Number of Stairs: 12 General stair comments: reciprocal to ascend, step-to to descend    Exercises     PT Diagnosis:    PT Problem List:   PT Treatment Interventions:     PT Goals (current goals can now be found in the care plan section) Acute Rehab PT Goals PT Goal Formulation: With patient  Visit Information  Last PT Received On: 04/29/13 Assistance Needed: +1 History of Present Illness: pt presents with recent Lap Chole and now with wound infection.      Subjective Data  Subjective: I feel so much better, getting better every day   Cognition  Cognition Arousal/Alertness: Awake/alert Behavior During Therapy: WFL for tasks assessed/performed Overall Cognitive Status: Within Functional Limits for tasks assessed    Balance  Balance Overall balance assessment: Independent Standing balance-Leahy Scale: Normal Standardized Balance Assessment Standardized Balance Assessment : Dynamic Gait Index Dynamic Gait Index Level Surface: Normal Change in Gait Speed: Normal Gait with Horizontal Head Turns: Normal Gait with Vertical Head Turns: Normal Gait and Pivot Turn: Normal Step Over Obstacle: Normal Step Around Obstacles: Normal Steps: Mild Impairment Total Score: 23 General Comments General comments (skin integrity, edema, etc.): abdominal wound dressing clean, dry and intact; gluteal drain intact with minimal brown drainage noted  End of Session PT - End of Session Activity Tolerance: Patient tolerated treatment well Patient left:  (walking in hallways) Nurse Communication: Mobility status   GP     Herbie Drape 04/29/2013, 12:01 PM

## 2013-04-30 MED ORDER — ENSURE PUDDING PO PUDG
1.0000 | Freq: Three times a day (TID) | ORAL | Status: DC
  Administered 2013-04-30 – 2013-05-07 (×12): 1 via ORAL

## 2013-04-30 MED ORDER — IBUPROFEN 600 MG PO TABS
600.0000 mg | ORAL_TABLET | Freq: Four times a day (QID) | ORAL | Status: DC | PRN
  Administered 2013-05-01 – 2013-05-03 (×2): 600 mg via ORAL
  Filled 2013-04-30 (×2): qty 1

## 2013-04-30 NOTE — Progress Notes (Signed)
  Subjective: Pelvic abscess drain placed 1/29 Draining well- fecal fluid Pt feels better Now with Rt arm phlebitis  Objective: Vital signs in last 24 hours: Temp:  [97.9 F (36.6 C)-98 F (36.7 C)] 97.9 F (36.6 C) (02/02 0510) Pulse Rate:  [72-82] 72 (02/02 0510) Resp:  [16-17] 17 (02/02 0510) BP: (128-137)/(49-60) 130/53 mmHg (02/02 0510) SpO2:  [94 %-98 %] 94 % (02/02 0510) Last BM Date: 04/29/13  Intake/Output from previous day: 02/01 0701 - 02/02 0700 In: 960 [P.O.:960] Out: -  Intake/Output this shift: Total I/O In: 240 [P.O.:240] Out: -   PE: afeb; vss TG pelvic drain intact Fecal fluid output: 30 cc in bag now Site clean and dry Sl tender; no sign of infection    Lab Results:  No results found for this basename: WBC, HGB, HCT, PLT,  in the last 72 hours BMET No results found for this basename: NA, K, CL, CO2, GLUCOSE, BUN, CREATININE, CALCIUM,  in the last 72 hours PT/INR No results found for this basename: LABPROT, INR,  in the last 72 hours ABG No results found for this basename: PHART, PCO2, PO2, HCO3,  in the last 72 hours  Studies/Results: No results found.  Anti-infectives: Anti-infectives   Start     Dose/Rate Route Frequency Ordered Stop   04/25/13 2000  ciprofloxacin (CIPRO) tablet 500 mg     500 mg Oral 2 times daily 04/25/13 1647     04/21/13 1800  ceFAZolin (ANCEF) IVPB 1 g/50 mL premix  Status:  Discontinued     1 g 100 mL/hr over 30 Minutes Intravenous 3 times per day 04/21/13 1717 04/25/13 1744      Assessment/Plan: s/p * No surgery found *  Drain intact Doing well Fecal fluid output--- will need inj of drain before pull Plan per Dr Marlou Starks   LOS: 9 days    Jonhatan Hearty A 04/30/2013

## 2013-04-30 NOTE — Progress Notes (Signed)
  Subjective: Pt developed phlebitis in right arm over weekend. Abdomen is doing ok  Objective: Vital signs in last 24 hours: Temp:  [97.9 F (36.6 C)-98 F (36.7 C)] 97.9 F (36.6 C) (02/02 0510) Pulse Rate:  [72-82] 72 (02/02 0510) Resp:  [16-17] 17 (02/02 0510) BP: (128-137)/(49-60) 130/53 mmHg (02/02 0510) SpO2:  [94 %-98 %] 94 % (02/02 0510) Last BM Date: 04/29/13  Intake/Output from previous day: 02/01 0701 - 02/02 0700 In: 960 [P.O.:960] Out: -  Intake/Output this shift:    Resp: clear to auscultation bilaterally Cardio: regular rate and rhythm GI: soft, minimal tenderness. incision looks clean. drain output brown thin liquid Extremities: swelling and redness with palpable cord in right antecubital fossa  Lab Results:  No results found for this basename: WBC, HGB, HCT, PLT,  in the last 72 hours BMET No results found for this basename: NA, K, CL, CO2, GLUCOSE, BUN, CREATININE, CALCIUM,  in the last 72 hours PT/INR No results found for this basename: LABPROT, INR,  in the last 72 hours ABG No results found for this basename: PHART, PCO2, PO2, HCO3,  in the last 72 hours  Studies/Results: No results found.  Anti-infectives: Anti-infectives   Start     Dose/Rate Route Frequency Ordered Stop   04/25/13 2000  ciprofloxacin (CIPRO) tablet 500 mg     500 mg Oral 2 times daily 04/25/13 1647     04/21/13 1800  ceFAZolin (ANCEF) IVPB 1 g/50 mL premix  Status:  Discontinued     1 g 100 mL/hr over 30 Minutes Intravenous 3 times per day 04/21/13 1717 04/25/13 1744      Assessment/Plan: s/p * No surgery found * Continue oral cipro Add ensure supplements to meals Continue warm compresses to right arm. Add ibuprofen Await bed at snf   LOS: 9 days    TOTH III,Tran Randle S 04/30/2013

## 2013-04-30 NOTE — Progress Notes (Signed)
CSW was contacted by CM concerning d/c planning to SNF.   Pt clinicals faxed to facilities and CSW will give bed offers.    CSW will continue to follow Pt for d/c planning for possible d/c tomorrow.    [facility]  [address];  [phone] Phone: [phone]

## 2013-04-30 NOTE — Care Management Note (Signed)
  Page 1 of 1   04/30/2013     11:25:31 AM   CARE MANAGEMENT NOTE 04/30/2013  Patient:  Tiffany Hood, Tiffany Hood   Account Number:  [id number]  Date Initiated:  04/26/2013  Documentation initiated by:  Sandi Mariscal  Subjective/Objective Assessment:   sigmoid colectomy for diverticulitis 1/16  Did well for days-> Returns to ER w/ abd pain; fever, incisional & vaginal drainage; CT reveals pelvic collection  Now scheduled for abscess drain placement     Action/Plan:   SNF at d/c   Anticipated DC Date:  05/01/2013   Anticipated DC Plan:  SKILLED NURSING FACILITY  In-house referral  Clinical Social Worker         Choice offered to / List presented to:             Status of service:  In process, will continue to follow Medicare Important Message given?   (If response is "NO", the following Medicare IM given date fields will be blank) Date Medicare IM given:   Date Additional Medicare IM given:    Discharge Disposition:    Per UR Regulation:  Reviewed for med. necessity/level of care/duration of stay  If discussed at [place] of Stay Meetings, dates discussed:    Comments:   04-30-13 PT has signed off . Spoke with patient and vistors . Patient would still like to go to SNF for wound care . Left voice mail for [place] [facility]. Magdalen Spatz RN BSN [phone]

## 2013-05-01 LAB — CBC WITH DIFFERENTIAL/PLATELET
BASOS ABS: 0 10*3/uL (ref 0.0–0.1)
BASOS PCT: 0 % (ref 0–1)
Eosinophils Absolute: 0.5 10*3/uL (ref 0.0–0.7)
Eosinophils Relative: 7 % — ABNORMAL HIGH (ref 0–5)
HCT: 32.9 % — ABNORMAL LOW (ref 36.0–46.0)
HEMOGLOBIN: 11 g/dL — AB (ref 12.0–15.0)
Lymphocytes Relative: 21 % (ref 12–46)
Lymphs Abs: 1.5 10*3/uL (ref 0.7–4.0)
MCH: 28.4 pg (ref 26.0–34.0)
MCHC: 33.4 g/dL (ref 30.0–36.0)
MCV: 85 fL (ref 78.0–100.0)
Monocytes Absolute: 0.5 10*3/uL (ref 0.1–1.0)
Monocytes Relative: 7 % (ref 3–12)
NEUTROS ABS: 4.6 10*3/uL (ref 1.7–7.7)
Neutrophils Relative %: 65 % (ref 43–77)
Platelets: 523 10*3/uL — ABNORMAL HIGH (ref 150–400)
RBC: 3.87 MIL/uL (ref 3.87–5.11)
RDW: 14.5 % (ref 11.5–15.5)
WBC: 7.1 10*3/uL (ref 4.0–10.5)

## 2013-05-01 LAB — BASIC METABOLIC PANEL
BUN: 15 mg/dL (ref 6–23)
CHLORIDE: 96 meq/L (ref 96–112)
CO2: 24 mEq/L (ref 19–32)
Calcium: 9.2 mg/dL (ref 8.4–10.5)
Creatinine, Ser: 0.88 mg/dL (ref 0.50–1.10)
GFR calc non Af Amer: 65 mL/min — ABNORMAL LOW (ref >90)
GFR, EST AFRICAN AMERICAN: 75 mL/min — AB (ref >90)
Glucose, Bld: 167 mg/dL — ABNORMAL HIGH (ref 70–99)
POTASSIUM: 3.8 meq/L (ref 3.7–5.3)
Sodium: 137 mEq/L (ref 137–147)

## 2013-05-01 NOTE — Progress Notes (Signed)
CSW spoke with Ardelle Park at [facility] [phone] and the facility is willing to provide a bed for Pt at d/c.    CSW will continue to follow Pt for d/c planning.    [facility]  [address];  [phone] Phone: [phone]

## 2013-05-01 NOTE — Progress Notes (Signed)
CSW provided bed offers to the Pt. Pt's choice is 1. [facility] and 2. [facility]. CSW left a message with Lexine Baton at [facility] to accept offer and is awaiting a call back.    CSW will continue to follow Pt for d/c planning.    [facility]  [address];  [phone] Phone: [phone]

## 2013-05-01 NOTE — Progress Notes (Signed)
  Subjective: TG pelvic abscess drain placed 1/29 Doing better daily Output has now changed to serous color Pt has been ambulating well Eating some  Objective: Vital signs in last 24 hours: Temp:  [97.3 F (36.3 C)-98.1 F (36.7 C)] 97.6 F (36.4 C) (02/03 0643) Pulse Rate:  [67-87] 67 (02/03 0643) Resp:  [18] 18 (02/03 0643) BP: (136-153)/(41-94) 136/59 mmHg (02/03 0643) SpO2:  [97 %-100 %] 100 % (02/03 0643) Last BM Date: 04/30/13  Intake/Output from previous day: 02/02 0701 - 02/03 0700 In: 250 [P.O.:240] Out: 55 [Drains:55] Intake/Output this shift:    PE: vss; afeb Drain intact Output 55 cc yesterday: serous drainage 10 cc in bag now- serous color   Lab Results:  No results found for this basename: WBC, HGB, HCT, PLT,  in the last 72 hours BMET No results found for this basename: NA, K, CL, CO2, GLUCOSE, BUN, CREATININE, CALCIUM,  in the last 72 hours PT/INR No results found for this basename: LABPROT, INR,  in the last 72 hours ABG No results found for this basename: PHART, PCO2, PO2, HCO3,  in the last 72 hours  Studies/Results: No results found.  Anti-infectives: Anti-infectives   Start     Dose/Rate Route Frequency Ordered Stop   04/25/13 2000  ciprofloxacin (CIPRO) tablet 500 mg     500 mg Oral 2 times daily 04/25/13 1647     04/21/13 1800  ceFAZolin (ANCEF) IVPB 1 g/50 mL premix  Status:  Discontinued     1 g 100 mL/hr over 30 Minutes Intravenous 3 times per day 04/21/13 1717 04/25/13 1744      Assessment/Plan: s/p * No surgery found *  TG pelvic abscess drain placed 1/29 intact Output from drain serous color today Changed from fecal appearing yesterday Dr Marlou Starks present in room Would like injection of drain in am--poss pull   LOS: 10 days    Chidinma Clites A 05/01/2013

## 2013-05-01 NOTE — Progress Notes (Signed)
  Subjective: No complaints  Objective: Vital signs in last 24 hours: Temp:  [97.3 F (36.3 C)-98.1 F (36.7 C)] 97.6 F (36.4 C) (02/03 0643) Pulse Rate:  [67-87] 67 (02/03 0643) Resp:  [18] 18 (02/03 0643) BP: (136-153)/(41-94) 136/59 mmHg (02/03 0643) SpO2:  [97 %-100 %] 100 % (02/03 0643) Last BM Date: 04/30/13  Intake/Output from previous day: 02/02 0701 - 02/03 0700 In: 250 [P.O.:240] Out: 55 [Drains:55] Intake/Output this shift:    Resp: clear to auscultation bilaterally Cardio: regular rate and rhythm GI: soft, minimal tenderness. wound looks clean. drain output clearing Extremities: less redness in antecubital fossa. still swollen  Lab Results:  No results found for this basename: WBC, HGB, HCT, PLT,  in the last 72 hours BMET No results found for this basename: NA, K, CL, CO2, GLUCOSE, BUN, CREATININE, CALCIUM,  in the last 72 hours PT/INR No results found for this basename: LABPROT, INR,  in the last 72 hours ABG No results found for this basename: PHART, PCO2, PO2, HCO3,  in the last 72 hours  Studies/Results: No results found.  Anti-infectives: Anti-infectives   Start     Dose/Rate Route Frequency Ordered Stop   04/25/13 2000  ciprofloxacin (CIPRO) tablet 500 mg     500 mg Oral 2 times daily 04/25/13 1647     04/21/13 1800  ceFAZolin (ANCEF) IVPB 1 g/50 mL premix  Status:  Discontinued     1 g 100 mL/hr over 30 Minutes Intravenous 3 times per day 04/21/13 1717 04/25/13 1744      Assessment/Plan: Tiffany [name] * No surgery found * Continue cipro Continue warm compresses and ibuprofen Plan for drain injection tomorrow Await snf  LOS: 10 days    TOTH III,Tiffany Tiffany Hood 05/01/2013

## 2013-05-01 NOTE — Progress Notes (Signed)
INITIAL NUTRITION ASSESSMENT  DOCUMENTATION CODES Per approved criteria  -Not Applicable   INTERVENTION: Continue Ensure Pudding TID Encourage intake of protein-rich foods  NUTRITION DIAGNOSIS: Increased nutrient needs related to wound healing as evidenced by estimated needs.   Goal: Pt to meet >/= 90% of their estimated nutrition needs   Monitor:  PO intake Weight Labs  Reason for Assessment: Malnutrition Screening Tool, score of 2  73 y.o. female  Admitting Dx: <principal problem not specified>  ASSESSMENT: 73 year old female who is status post a laparoscopic-assisted sigmoid colectomy by Dr. Marlou Starks on January 16. She did well and was discharged home on January 21. She started developing erythema and drainage from her incision so presented to the emergency department.  Pt reports that her appetite is varied but, she forces herself to eat 3 meals daily and she was drinking one Ensure supplements daily at home PTA. She reports eating 50-100% of meals since admission and denies any weight loss. She states she knows protein is important but, she doesn't like meat. Discussed protein-rich foods and encouraged pt to incorporate protein-rich foods at each meal.  Height: Ht Readings from Last 1 Encounters:  04/23/13 5\' 2"  (1.575 m)    Weight: Wt Readings from Last 1 Encounters:  04/23/13 156 lb 12 oz (71.1 kg)    Ideal Body Weight: 110 lbs  % Ideal Body Weight: 142%  Wt Readings from Last 10 Encounters:  04/23/13 156 lb 12 oz (71.1 kg)  04/13/13 148 lb (67.132 kg)  04/13/13 148 lb (67.132 kg)  04/05/13 150 lb 3.2 oz (68.13 kg)  03/30/13 148 lb (67.132 kg)  11/30/12 142 lb (64.411 kg)  11/10/12 146 lb (66.225 kg)  11/06/12 146 lb 6.4 oz (66.407 kg)  10/30/12 147 lb (66.679 kg)  10/30/12 146 lb 4.8 oz (66.361 kg)    Usual Body Weight: 148 lbs  % Usual Body Weight: 105%  BMI:  Body mass index is 28.66 kg/(m^2).  Estimated Nutritional Needs: Kcal:  1700-1900 Protein: 85-100 grams Fluid: 1.7-1.9 L/day  Skin: abdominal incisions with port; buttocks incision with drain  Diet Order: General  EDUCATION NEEDS: -No education needs identified at this time   Intake/Output Summary (Last 24 hours) at 05/01/13 1243 Last data filed at 05/01/13 0900  Gross per 24 hour  Intake    250 ml  Output     55 ml  Net    195 ml    Last BM: 2/2   Labs:   Recent Labs Lab 04/27/13 1020 05/01/13 0920  NA 137 137  K 4.2 3.8  CL 98 96  CO2 25 24  BUN 6 15  CREATININE 0.56 0.88  CALCIUM 9.0 9.2  GLUCOSE 153* 167*    CBG (last 3)  No results found for this basename: GLUCAP,  in the last 72 hours  Scheduled Meds: . aspirin EC  81 mg Oral Q M,W,F  . ciprofloxacin  500 mg Oral BID  . enoxaparin (LOVENOX) injection  40 mg Subcutaneous Q24H  . feeding supplement (ENSURE)  1 Container Oral TID BM  . folic acid  1 mg Oral Daily  . losartan  50 mg Oral Daily  . simvastatin  40 mg Oral q1800  . triamterene-hydrochlorothiazide  1 tablet Oral Daily    Continuous Infusions: . dextrose 5 % and 0.9 % NaCl with KCl 20 mEq/L 10 mL/hr at 04/27/13 1443    Past Medical History  Diagnosis Date  . Hypertension   . Hyperlipidemia   .  Osteoporosis     Fosamax Rxed 2/13  . Breast cancer left  . Diverticulitis     09/14/12  . Diverticulosis   . Personal history of colonic adenomas 11/15/2012  . Heart murmur   . Diabetes mellitus     TYPE 2    Past Surgical History  Procedure Laterality Date  . Abdominal hysterectomy  1965    ovaries remain; for dysfunctional menses; ? endometriosis  . Tonsillectomy and adenoidectomy    . Colonoscopy  2004     Tics  . Removal bone spur Left 2/14    2nd finger  . Colon polyps  2014    Dr Carlean Purl  . Breast lumpectomy Left   . Laparoscopic sigmoid colectomy  04/13/2013    DR TOTH  . Laparoscopic sigmoid colectomy N/A 04/13/2013    Procedure: LAPAROSCOPIC ASSISTED SIGMOID COLECTOMY;  Surgeon: Merrie Roof, MD;  Location: [facility];  Service: General;  Laterality: N/A;  . Abdominal surgery      Pryor Ochoa RD, LDN Inpatient Clinical Dietitian Pager: [phone] After Hours Pager: [phone]

## 2013-05-02 ENCOUNTER — Inpatient Hospital Stay (HOSPITAL_COMMUNITY): Payer: Medicare Other

## 2013-05-02 MED ORDER — IOHEXOL 300 MG/ML  SOLN
50.0000 mL | Freq: Once | INTRAMUSCULAR | Status: AC | PRN
  Administered 2013-05-02: 10 mL

## 2013-05-02 NOTE — Progress Notes (Signed)
CSW received a call from Cooper at [facility] concerning Pt still needing a bed.   CSW contacted Ardelle Park back and confirmed that Pt would possibly need a bed for tomorrow.   Ardelle Park stated they will still be able to offer a bed when Pt is ready.    CSW will continue to follow Pt for d/c planning.    [facility]  4N [address]16;  [phone] Phone: [phone]

## 2013-05-02 NOTE — Progress Notes (Signed)
  Subjective: No complaints   Objective: Vital signs in last 24 hours: Temp:  [97.5 F (36.4 C)-98.2 F (36.8 C)] 97.8 F (36.6 C) (02/04 0500) Pulse Rate:  [68-82] 68 (02/04 0500) Resp:  [17-18] 17 (02/04 0500) BP: (122-146)/(48-60) 146/48 mmHg (02/04 0500) SpO2:  [96 %-100 %] 96 % (02/04 0500) Last BM Date: 04/30/13  Intake/Output from previous day: 02/03 0701 - 02/04 0700 In: 490 [P.O.:480] Out: -  Intake/Output this shift:    Resp: clear to auscultation bilaterally Cardio: regular rate and rhythm GI: soft, nontender. wound looks clean. drain output clearing Extremities: redness resolved. swelling improved  Lab Results:   Recent Labs  05/01/13 0920  WBC 7.1  HGB 11.0*  HCT 32.9*  PLT 523*   BMET  Recent Labs  05/01/13 0920  NA 137  K 3.8  CL 96  CO2 24  GLUCOSE 167*  BUN 15  CREATININE 0.88  CALCIUM 9.2   PT/INR No results found for this basename: LABPROT, INR,  in the last 72 hours ABG No results found for this basename: PHART, PCO2, PO2, HCO3,  in the last 72 hours  Studies/Results: No results found.  Anti-infectives: Anti-infectives   Start     Dose/Rate Route Frequency Ordered Stop   04/25/13 2000  ciprofloxacin (CIPRO) tablet 500 mg     500 mg Oral 2 times daily 04/25/13 1647     04/21/13 1800  ceFAZolin (ANCEF) IVPB 1 g/50 mL premix  Status:  Discontinued     1 g 100 mL/hr over 30 Minutes Intravenous 3 times per day 04/21/13 1717 04/25/13 1744      Assessment/Plan: s/p * No surgery found * Plan for drain injection today May be ready for snf soon  LOS: 11 days    TOTH III,Raegan Sipp S 05/02/2013

## 2013-05-02 NOTE — Progress Notes (Signed)
Patient ID: Tiffany Hood, female   DOB: Aug 23, 1940, 73 y.o.   MRN: [id number]    Pelvic abscess drain placed 1/29  Drain injection performed today. +fistula from abscess to rectum  Drain to remain in place  Plan per Dr Marlou Starks

## 2013-05-02 NOTE — Progress Notes (Signed)
CSW provided list of bed offers for SNF facilities.   Pt is in agreement to receive wound care/rehab at [facility].    CSW will continue to follow Pt for d/c planning.    [facility]  [address];  [phone] Phone: [phone]

## 2013-05-03 MED ORDER — CIPROFLOXACIN HCL 250 MG PO TABS
250.0000 mg | ORAL_TABLET | Freq: Two times a day (BID) | ORAL | Status: DC
  Administered 2013-05-03 – 2013-05-07 (×8): 250 mg via ORAL
  Filled 2013-05-03 (×11): qty 1

## 2013-05-03 MED ORDER — ZOLPIDEM TARTRATE 5 MG PO TABS
5.0000 mg | ORAL_TABLET | Freq: Every evening | ORAL | Status: DC | PRN
  Administered 2013-05-03 – 2013-05-06 (×4): 5 mg via ORAL
  Filled 2013-05-03 (×4): qty 1

## 2013-05-03 NOTE — Progress Notes (Signed)
  Subjective: No complaints other than not getting much sleep. 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 wound clean. drain output clear  Lab Results:   Recent Labs  05/01/13 0920  WBC 7.1  HGB 11.0*  HCT 32.9*  PLT 523*   BMET  Recent Labs  05/01/13 0920  NA 137  K 3.8  CL 96  CO2 24  GLUCOSE 167*  BUN 15  CREATININE 0.88  CALCIUM 9.2   PT/INR No results found for this basename: LABPROT, INR,  in the last 72 hours ABG No results found for this basename: PHART, PCO2, PO2, HCO3,  in the last 72 hours  Studies/Results: Ir Sinus/fist Tube Chk-non Gi  05/02/2013   CLINICAL DATA:  73 year old with the pelvic abscess. 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  EXAM: SINUS TRACT INJECTION/FISTULOGRAM  Physician: Stephan Minister. Henn, MD  MEDICATIONS: None  ANESTHESIA/SEDATION: Moderate sedation time: None  FLUOROSCOPY TIME:  2 min and 6 seconds  PROCEDURE: The patient was placed supine on the interventional table. Contrast was injected through the percutaneous drainage catheter with fluoroscopy. The contrast was aspirated at the end of the procedure and the catheter was flushed with normal saline. Catheter was attached to the gravity bag.  COMPLICATIONS: None  FINDINGS: Pigtail catheter in the pelvis, just right of midline. The initial images demonstrate filling of a small collection around the drain. Additional contrast demonstrates contrast  pooling around a bowel structure. Delayed images demonstrate contrast filling the distal rectum.  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   Electronically Signed   By: Markus Daft M.D.   On: 05/02/2013 11:43    Anti-infectives: Anti-infectives   Start     Dose/Rate Route Frequency Ordered Stop   05/03/13 2000  ciprofloxacin (CIPRO) tablet 250 mg     250 mg Oral 2 times daily 05/03/13 1003     04/25/13 2000  ciprofloxacin (CIPRO) tablet 500 mg  Status:  Discontinued     500 mg Oral 2 times daily 04/25/13 1647 05/03/13 1003   04/21/13 1800  ceFAZolin (ANCEF) IVPB 1 g/50 mL premix  Status:  Discontinued     1 g 100 mL/hr over 30 Minutes Intravenous 3 times per day 04/21/13 1717 04/25/13 1744      Assessment/Plan: s/p * No surgery found * will decrease cipro dose since it upsets her stomach Will add ambien for sleep Will need to keep drain for now and restudy it next week Continue dressing changes bid Waiting for bed at snf  LOS: 12 days    TOTH III,Mariama Saintvil S 05/03/2013

## 2013-05-04 NOTE — Progress Notes (Signed)
Subjective: Patient states she is getting stronger each day, she denies any N/V, and pain around drain site is minimal. She denies any fever or chills.   Objective: Physical Exam: BP 112/56  Pulse 88  Temp(Src) 98.4 F (36.9 C) (Oral)  Resp 20  Ht 5\' 2"  (1.575 m)  Wt 156 lb 12 oz (71.1 kg)  BMI 28.66 kg/m2  SpO2 97%  General: A&Ox3, NAD, sitting up in the chair Abd: Soft, ND, (+) BS, right TG drain intact, 24 hour clear output 3ml  Labs: CBC No results found for this basename: WBC, HGB, HCT, PLT,  in the last 72 hours BMET No results found for this basename: NA, K, CL, CO2, GLUCOSE, BUN, CREATININE, CALCIUM,  in the last 72 hours LFT No results found for this basename: PROT, ALBUMIN, AST, ALT, ALKPHOS, BILITOT, BILIDIR, IBILI, LIPASE,  in the last 72 hours PT/INR No results found for this basename: LABPROT, INR,  in the last 72 hours  Studies/Results: No results found.  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 Wbc trending down, afebrile, pain controlled.  Patient going to SNF soon, repeat imaging next week per [facility]. All other plans per [facility].   LOS: 13 days    Rockney Ghee 05/04/2013 12:26 PM

## 2013-05-04 NOTE — Progress Notes (Signed)
  Subjective: No complaints  Objective: Vital signs in last 24 hours: Temp:  [97.2 F (36.2 C)-98.4 F (36.9 C)] 97.2 F (36.2 C) (02/06 1332) Pulse Rate:  [68-88] 69 (02/06 1332) Resp:  [18-20] 18 (02/06 1332) BP: (112-140)/(55-76) 133/55 mmHg (02/06 1332) SpO2:  [94 %-100 %] 94 % (02/06 1332) Last BM Date: 05/02/13  Intake/Output from previous day: 02/05 0701 - 02/06 0700 In: 720 [P.O.:720] Out: -  Intake/Output this shift:    Resp: clear to auscultation bilaterally Cardio: regular rate and rhythm GI: soft, nontender. wound clean. drain output serous  Lab Results:  No results found for this basename: WBC, HGB, HCT, PLT,  in the last 72 hours BMET No results found for this basename: NA, K, CL, CO2, GLUCOSE, BUN, CREATININE, CALCIUM,  in the last 72 hours PT/INR No results found for this basename: LABPROT, INR,  in the last 72 hours ABG No results found for this basename: PHART, PCO2, PO2, HCO3,  in the last 72 hours  Studies/Results: No results found.  Anti-infectives: Anti-infectives   Start     Dose/Rate Route Frequency Ordered Stop   05/03/13 2000  ciprofloxacin (CIPRO) tablet 250 mg     250 mg Oral 2 times daily 05/03/13 1003     04/25/13 2000  ciprofloxacin (CIPRO) tablet 500 mg  Status:  Discontinued     500 mg Oral 2 times daily 04/25/13 1647 05/03/13 1003   04/21/13 1800  ceFAZolin (ANCEF) IVPB 1 g/50 mL premix  Status:  Discontinued     1 g 100 mL/hr over 30 Minutes Intravenous 3 times per day 04/21/13 1717 04/25/13 1744      Assessment/Plan: s/p * No surgery found * Continue cipro and drain for small contained leak Ambulate Waiting for snf placement  LOS: 13 days    TOTH III,Arriana Lohmann S 05/04/2013

## 2013-05-05 NOTE — Progress Notes (Signed)
  Subjective: Feels good. No complaints  Objective: Vital signs in last 24 hours: Temp:  [97.2 F (36.2 C)-97.8 F (36.6 C)] 97.8 F (36.6 C) (02/07 0607) Pulse Rate:  [69-84] 80 (02/07 0607) Resp:  [18-19] 18 (02/07 0607) BP: (130-146)/(41-55) 146/49 mmHg (02/07 0607) SpO2:  [94 %-100 %] 98 % (02/07 0607) Last BM Date: 05/02/13  Intake/Output from previous day: 02/06 0701 - 02/07 0700 In: -  Out: 15 [Drains:15] Intake/Output this shift:    Resp: clear to auscultation bilaterally Cardio: regular rate and rhythm GI: soft, nontender. wound clean. drain output clear  Lab Results:  No results found for this basename: WBC, HGB, HCT, PLT,  in the last 72 hours BMET No results found for this basename: NA, K, CL, CO2, GLUCOSE, BUN, CREATININE, CALCIUM,  in the last 72 hours PT/INR No results found for this basename: LABPROT, INR,  in the last 72 hours ABG No results found for this basename: PHART, PCO2, PO2, HCO3,  in the last 72 hours  Studies/Results: No results found.  Anti-infectives: Anti-infectives   Start     Dose/Rate Route Frequency Ordered Stop   05/03/13 2000  ciprofloxacin (CIPRO) tablet 250 mg     250 mg Oral 2 times daily 05/03/13 1003     04/25/13 2000  ciprofloxacin (CIPRO) tablet 500 mg  Status:  Discontinued     500 mg Oral 2 times daily 04/25/13 1647 05/03/13 1003   04/21/13 1800  ceFAZolin (ANCEF) IVPB 1 g/50 mL premix  Status:  Discontinued     1 g 100 mL/hr over 30 Minutes Intravenous 3 times per day 04/21/13 1717 04/25/13 1744      Assessment/Plan: s/p * No surgery found * continue cipro and drain for small contained leak Will need drain injection study again later next week Plan for snf on Monday Twice a day dressing changes  LOS: 14 days    TOTH III,Nycholas Rayner S 05/05/2013

## 2013-05-06 NOTE — Progress Notes (Signed)
  Subjective: Doing well. No complaints  Objective: Vital signs in last 24 hours: Temp:  [97.8 F (36.6 C)-98.6 F (37 C)] 98.3 F (36.8 C) (02/07 2153) Pulse Rate:  [76-88] 76 (02/07 2153) Resp:  [16-20] 20 (02/07 2153) BP: (142-146)/(44-67) 144/44 mmHg (02/07 2153) SpO2:  [97 %-98 %] 97 % (02/07 2153) Last BM Date: 05/05/13  Intake/Output from previous day:   Intake/Output this shift:    Resp: clear to auscultation bilaterally Cardio: regular rate and rhythm GI: soft, nontender. wound clean. drain output clear  Lab Results:  No results found for this basename: WBC, HGB, HCT, PLT,  in the last 72 hours BMET No results found for this basename: NA, K, CL, CO2, GLUCOSE, BUN, CREATININE, CALCIUM,  in the last 72 hours PT/INR No results found for this basename: LABPROT, INR,  in the last 72 hours ABG No results found for this basename: PHART, PCO2, PO2, HCO3,  in the last 72 hours  Studies/Results: No results found.  Anti-infectives: Anti-infectives   Start     Dose/Rate Route Frequency Ordered Stop   05/03/13 2000  ciprofloxacin (CIPRO) tablet 250 mg     250 mg Oral 2 times daily 05/03/13 1003     04/25/13 2000  ciprofloxacin (CIPRO) tablet 500 mg  Status:  Discontinued     500 mg Oral 2 times daily 04/25/13 1647 05/03/13 1003   04/21/13 1800  ceFAZolin (ANCEF) IVPB 1 g/50 mL premix  Status:  Discontinued     1 g 100 mL/hr over 30 Minutes Intravenous 3 times per day 04/21/13 1717 04/25/13 1744      Assessment/Plan: s/p * No surgery found * continue cipro and drain for small contained leak Continue dressing changes bid Plan for snf tomorrow Will need drain study again later this week  LOS: 15 days    TOTH Hood,Tiffany Layson S 05/06/2013

## 2013-05-07 NOTE — Progress Notes (Signed)
Chaplain engaged in conversation with patient in hallway. Chaplain offered ministry of presence and support.   05/07/13 1100  Clinical Encounter Type  Visited With Patient  Visit Type Initial

## 2013-05-07 NOTE — Progress Notes (Signed)
  Subjective: No complaints. Ready to go to snf  Objective: Vital signs in last 24 hours: Temp:  [97.1 F (36.2 C)-97.9 F (36.6 C)] 97.1 F (36.2 C) (02/09 0545) Pulse Rate:  [72-80] 72 (02/09 0545) Resp:  [17-19] 17 (02/09 0545) BP: (131-150)/(44-70) 144/47 mmHg (02/09 0545) SpO2:  [97 %-100 %] 100 % (02/09 0545) Last BM Date: 05/06/13  Intake/Output from previous day: 02/08 0701 - 02/09 0700 In: 605 [P.O.:600] Out: 0  Intake/Output this shift:    Resp: clear to auscultation bilaterally Cardio: regular rate and rhythm GI: soft, nontender. wound clean. drain output clear  Lab Results:  No results found for this basename: WBC, HGB, HCT, PLT,  in the last 72 hours BMET No results found for this basename: NA, K, CL, CO2, GLUCOSE, BUN, CREATININE, CALCIUM,  in the last 72 hours PT/INR No results found for this basename: LABPROT, INR,  in the last 72 hours ABG No results found for this basename: PHART, PCO2, PO2, HCO3,  in the last 72 hours  Studies/Results: No results found.  Anti-infectives: Anti-infectives   Start     Dose/Rate Route Frequency Ordered Stop   05/03/13 2000  ciprofloxacin (CIPRO) tablet 250 mg     250 mg Oral 2 times daily 05/03/13 1003     04/25/13 2000  ciprofloxacin (CIPRO) tablet 500 mg  Status:  Discontinued     500 mg Oral 2 times daily 04/25/13 1647 05/03/13 1003   04/21/13 1800  ceFAZolin (ANCEF) IVPB 1 g/50 mL premix  Status:  Discontinued     1 g 100 mL/hr over 30 Minutes Intravenous 3 times per day 04/21/13 1717 04/25/13 1744      Assessment/Plan: s/p * No surgery found * will plan to discharge to snf today Will need to restudy drain later this week Will need bid dressing changes and drain flush with emptying and recording output  LOS: 16 days    TOTH III,Ellias Mcelreath S 05/07/2013

## 2013-05-07 NOTE — Clinical Social Work Placement (Signed)
Clinical Social Work Department CLINICAL SOCIAL WORK PLACEMENT NOTE 05/07/2013  Patient:  Tiffany Hood, Tiffany Hood  Account Number:  [id number] Admit date:  04/21/2013  Clinical Social Worker:  Pete Pelt, CLINICAL SOCIAL WORKER  Date/time:  04/26/2013 04:16 PM  Clinical Social Work is seeking post-discharge placement for this patient at the following level of care:   SKILLED NURSING   (*CSW will update this form in Epic as items are completed)   04/26/2013  Patient/family provided with [facility] Department of Clinical Social Work's list of facilities offering this level of care within the geographic area requested by the patient (or if unable, by the patient's family).  04/26/2013  Patient/family informed of their freedom to choose among providers that offer the needed level of care, that participate in Medicare, Medicaid or managed care program needed by the patient, have an available bed and are willing to accept the patient.  04/26/2013  Patient/family informed of [facility]' ownership interest in [facility], as well as of the fact that they are under no obligation to receive care at this facility.  PASARR submitted to EDS on 04/26/2013 PASARR number received from [employer] on 04/26/2013  FL2 transmitted to all facilities in geographic area requested by pt/family on  04/26/2013 FL2 transmitted to all facilities within larger geographic area on 04/26/2013  Patient informed that his/her managed care company has contracts with or will negotiate with  certain facilities, including the following:     Patient/family informed of bed offers received:  05/02/2013 Patient chooses bed at [facility] Physician recommends and patient chooses bed at    Patient to be transferred to [facility] on  05/07/2013 Patient to be transferred to facility by ambulance  The following physician request were entered in Epic:   Additional Comments: Per  MD patient ready to DC to [facility]. Patient to complete paperwork with Lexine Baton at facility. RN, patient, and facility notified of DC. CSW requested ambulance transport. DC packet left on chart. CSW signing off.   Liz Beach, [place], [place], [phone]

## 2013-05-07 NOTE — Discharge Summary (Signed)
Physician Discharge Summary  Patient ID: Tiffany Hood MRN: [id number] DOB/AGE: 72-08-1940 72 y.o.  Admit date: 04/21/2013 Discharge date: 05/07/2013  Admission Diagnoses:  Discharge Diagnoses:  Active Problems:   Wound infection   Discharged Condition: good  Hospital Course: the pt underwent lap assisted sigmoid colectomy. She initially did well but was readmitted with a superficial wound infection. She had persistent slight elevation of her wbc although she looked well. A ct showed a small fluid collection in the pelvis. This was able to be perc drained. 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 The output has cleared and is minimal. She is ready to go to snf. She will need bid dressing changes wet to dry. She will need the drain emptied and flushed bid. She will need a repeat study of drain later this week with IR.   Consults: None  Significant Diagnostic Studies: none  Treatments: surgery: as above  Discharge Exam: Blood pressure 144/47, pulse 72, temperature 97.1 F (36.2 C), temperature source Axillary, resp. rate 17, height 5\' 2"  (1.575 m), weight 156 lb 12 oz (71.1 kg), SpO2 100.00%. Resp: clear to auscultation bilaterally Cardio: regular rate and rhythm GI: soft, nontender. wound clean. drain output clear  Disposition: 01-Home or Self Care  Discharge Orders   Future Appointments Provider Department Dept Phone   11/01/2013 10:00 AM [facility] [phone]   11/01/2013 10:30 AM Deatra Robinson, MD [facility] [phone]   Future Orders Complete By Expires   Call MD for:  difficulty breathing, headache or visual disturbances  As directed    Call MD for:  extreme fatigue  As directed    Call MD for:  hives  As directed    Call MD for:  persistant dizziness or light-headedness  As directed    Call MD for:  persistant nausea and vomiting  As directed    Call MD for:  redness,  tenderness, or signs of infection (pain, swelling, redness, odor or green/yellow discharge around incision site)  As directed    Call MD for:  severe uncontrolled pain  As directed    Call MD for:  temperature >100.4  As directed    Diet - low sodium heart healthy  As directed    Discharge instructions  As directed    Comments:     Wet to dry dressing changes bid. Flush drain and record output bid. Will need repeat study of drain later this week with IR. Take cipro 250mg  bid until follow up   Increase activity slowly  As directed        Medication List         alendronate 70 MG tablet  Commonly known as:  FOSAMAX  Take 70 mg by mouth every Saturday. Take with a full glass of water on an empty stomach.     aspirin 81 MG tablet  Take 81 mg by mouth every Monday, Wednesday, and Friday.     CALTRATE 600+D PO  Take 1 capsule by mouth daily.     Fish Oil 1000 MG Caps  Take 1,000 mg by mouth daily.     folic acid 1 MG tablet  Commonly known as:  FOLVITE  Take 1 mg by mouth daily.     losartan 50 MG tablet  Commonly known as:  COZAAR  Take 50 mg by mouth daily.     methocarbamol 500 MG tablet  Commonly known as:  ROBAXIN  Take 250 mg by mouth every 8 (eight) hours as needed for muscle spasms.     multivitamin tablet  Take 1 tablet by mouth daily.     oxyCODONE-acetaminophen 5-325 MG per tablet  Commonly known as:  ROXICET  Take 1-2 tablets by mouth every 4 (four) hours as needed for severe pain.     pravastatin 40 MG tablet  Commonly known as:  PRAVACHOL  Take 40 mg by mouth daily.     SYSTANE 0.4-0.3 % Soln  Generic drug:  Polyethyl Glycol-Propyl Glycol  Place 1 drop into both eyes every morning.     triamterene-hydrochlorothiazide 75-50 MG per tablet  Commonly known as:  MAXZIDE  Take 0.5 tablets by mouth daily.     vitamin E 400 UNIT capsule  Take 400 Units by mouth daily.         SignedJovita Kussmaul S 05/07/2013, 10:37 AM

## 2013-05-07 NOTE — Progress Notes (Signed)
Discharge instructions reviewed with pt.  Pt verbalized understanding and had no questions.  Pt discharged in stable condition via EMS to SNF.  Discharge packet and prescriptions sent via EMS with pt.  Eliezer Bottom Waller

## 2013-05-08 ENCOUNTER — Other Ambulatory Visit: Payer: Self-pay | Admitting: *Deleted

## 2013-05-08 MED ORDER — OXYCODONE-ACETAMINOPHEN 5-325 MG PO TABS
ORAL_TABLET | ORAL | Status: DC
Start: 2013-05-08 — End: 2013-07-31

## 2013-05-08 NOTE — Telephone Encounter (Signed)
[facility] 

## 2013-05-11 ENCOUNTER — Telehealth (INDEPENDENT_AMBULATORY_CARE_PROVIDER_SITE_OTHER): Payer: Self-pay

## 2013-05-11 NOTE — Telephone Encounter (Signed)
LMOM> Dr Marlou Starks cancelled office on Wednesday next week. Moved her appt to Friday 2/20 at 4:30

## 2013-05-14 ENCOUNTER — Non-Acute Institutional Stay (SKILLED_NURSING_FACILITY): Payer: Medicare Other | Admitting: Internal Medicine

## 2013-05-14 ENCOUNTER — Telehealth (INDEPENDENT_AMBULATORY_CARE_PROVIDER_SITE_OTHER): Payer: Self-pay

## 2013-05-14 DIAGNOSIS — M810 Age-related osteoporosis without current pathological fracture: Secondary | ICD-10-CM

## 2013-05-14 DIAGNOSIS — I10 Essential (primary) hypertension: Secondary | ICD-10-CM

## 2013-05-14 DIAGNOSIS — L089 Local infection of the skin and subcutaneous tissue, unspecified: Secondary | ICD-10-CM

## 2013-05-14 DIAGNOSIS — D638 Anemia in other chronic diseases classified elsewhere: Secondary | ICD-10-CM

## 2013-05-14 DIAGNOSIS — T148XXA Other injury of unspecified body region, initial encounter: Principal | ICD-10-CM

## 2013-05-14 NOTE — Telephone Encounter (Signed)
Patient states she has a drain that is not draining very much 5-10 cc, she is asking should the nurse at [facility] or our [facility] remove the drain . I advised I would send this to Dr. Marlou Starks and I would call [facility] @ [phone] with his orders Patient is aware she has appt 05-18-13

## 2013-05-14 NOTE — Telephone Encounter (Signed)
Per Dr Marlou Starks drain is NOT to be pulled. She will need a drain study prior to it being removed. We will set that up with IR and call her with that appt.

## 2013-05-16 ENCOUNTER — Encounter (INDEPENDENT_AMBULATORY_CARE_PROVIDER_SITE_OTHER): Payer: Medicare Other | Admitting: General Surgery

## 2013-05-18 ENCOUNTER — Encounter (INDEPENDENT_AMBULATORY_CARE_PROVIDER_SITE_OTHER): Payer: Self-pay | Admitting: General Surgery

## 2013-05-18 ENCOUNTER — Ambulatory Visit (INDEPENDENT_AMBULATORY_CARE_PROVIDER_SITE_OTHER): Payer: Medicare Other | Admitting: General Surgery

## 2013-05-18 ENCOUNTER — Telehealth (INDEPENDENT_AMBULATORY_CARE_PROVIDER_SITE_OTHER): Payer: Self-pay | Admitting: *Deleted

## 2013-05-18 VITALS — BP 118/82 | HR 80 | Temp 98.3°F | Resp 12 | Ht 62.5 in | Wt 140.8 lb

## 2013-05-18 DIAGNOSIS — K5732 Diverticulitis of large intestine without perforation or abscess without bleeding: Secondary | ICD-10-CM

## 2013-05-18 NOTE — Telephone Encounter (Signed)
Dr Marlou Starks okay with these orders as long as she has family to complete dressing changes .

## 2013-05-18 NOTE — Progress Notes (Signed)
Subjective:     Patient ID: Tiffany Hood, female   DOB: 1940/11/15, 73 y.o.   MRN: [id number]  HPI The patient is a 73 year old white female who is about one month status post laparoscopic assisted sigmoid colectomy. Her course has been complicated by a superficial wound infection as well as a small fistula connection to her anastomosis. This was drained percutaneously. She is having no fluid out of the drain anymore. Her appetite is good and her bowels are working normally.  Review of Systems     Objective:   Physical Exam On exam her abdomen is soft and nontender. Her open midline wound is very clean with good granulation tissue. We repacked the wound today and she tolerated it well. Her drain has minimal clear output    Assessment:     The patient is one month status post laparoscopic assisted sigmoid colectomy     Plan:     At this point we will decrease her dressing changes to once a day. We will have interventional radiology study her drain and if there is no further fistula connection they can remove the drain. I will plan to see her back in about 3 weeks to recheck her wound.

## 2013-05-18 NOTE — Telephone Encounter (Signed)
Tiffany Hood with home health called to ask for new orders for dressing changes.  She asked if they can go down to daily changes rather than BID.  She also asked if they can perform the daily changes for 1 week then move to three times per week for the next 5 weeks follow that.  Explained that I will have to send a message to Dr. Marlou Starks to get his opinion on this then we will let her know.  Patient has an appt to see Dr. Marlou Starks this afternoon so decision will probably not be made until after this appt.  Tiffany Hood states understanding of the plan at this time.

## 2013-05-18 NOTE — Patient Instructions (Signed)
Will have IR study drain and pull if it looks ok Go to once a day on the dressing changes

## 2013-05-20 DIAGNOSIS — D638 Anemia in other chronic diseases classified elsewhere: Secondary | ICD-10-CM | POA: Insufficient documentation

## 2013-05-20 NOTE — Progress Notes (Signed)
HISTORY & PHYSICAL  DATE: 05/14/2013   FACILITY: [facility]  LEVEL OF CARE: SNF (31)  ALLERGIES:  No Known Allergies  CHIEF COMPLAINT:  Manage her abdominal wound infection, hypertension and osteoporosis  HISTORY OF PRESENT ILLNESS: patient is a 73 year old Caucasian female who was hospitalized secondary to a postsurgical abdomen wound infection. After hospitalization she is admitted to this facility for short-term rehabilitation and wound care.  WOUND INFECTION: Patient is status post sigmoid colectomy. She was readmitted with a superficial wound infection. CT of the abdomen showed a small fluid collection in the pelvis. This fluid collection was percutaneously drained. 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 Patient denies abdominal pain.  HTN: Pt 's HTN remains stable.  Denies CP, sob, DOE, pedal edema, headaches, dizziness or visual disturbances.  No complications from the medications currently being used.  Last BP : 131/63.  OSTEOPOROSIS: The patient's osteoporosis remains stable. The patient denies recent worsening of pain and the range of motion remains stable. There has not been any evidence of fractures. No complications noted from the medications presently being used.   PAST MEDICAL HISTORY :  Past Medical History  Diagnosis Date  . Hypertension   . Hyperlipidemia   . Osteoporosis     Fosamax Rxed 2/13  . Breast cancer left  . Diverticulitis     09/14/12  . Diverticulosis   . Personal history of colonic adenomas 11/15/2012  . Heart murmur   . Diabetes mellitus     TYPE 2    PAST SURGICAL HISTORY: Past Surgical History  Procedure Laterality Date  . Abdominal hysterectomy  1965    ovaries remain; for dysfunctional menses; ? endometriosis  . Tonsillectomy and adenoidectomy    . Colonoscopy  2004     Tics  . Removal bone spur Left 2/14    2nd finger  . Colon polyps  2014    Dr Carlean Purl  . Breast  lumpectomy Left   . Laparoscopic sigmoid colectomy  04/13/2013    DR TOTH  . Laparoscopic sigmoid colectomy N/A 04/13/2013    Procedure: LAPAROSCOPIC ASSISTED SIGMOID COLECTOMY;  Surgeon: Merrie Roof, MD;  Location: [facility];  Service: General;  Laterality: N/A;  . Abdominal surgery      SOCIAL HISTORY:  reports that she has never smoked. She has never used smokeless tobacco. She reports that she does not drink alcohol or use illicit drugs.  FAMILY HISTORY:  Family History  Problem Relation Age of Onset  . Stroke Mother 87  . Hypertension Mother   . Lung cancer Father     smoker  . Hypertension Sister   . Hypertension Brother   . Stroke Brother 68  . Heart disease Brother 105  . Diabetes Neg Hx   . Colon cancer Neg Hx     CURRENT MEDICATIONS: Reviewed per [facility]  REVIEW OF SYSTEMS:  See HPI otherwise 14 point ROS is negative.  PHYSICAL EXAMINATION  VS:  See VS section  GENERAL: no acute distress, normal body habitus EYES: conjunctivae normal, sclerae normal, normal eye lids MOUTH/THROAT: lips without lesions,no lesions in the mouth,tongue is without lesions,uvula elevates in midline NECK: supple, trachea midline, no neck masses, no thyroid tenderness, no thyromegaly LYMPHATICS: no LAN in the neck, no supraclavicular LAN RESPIRATORY: breathing is even & unlabored, BS CTAB CARDIAC: RRR, no murmur,no extra heart sounds, no edema GI:  ABDOMEN: abdomen soft, normal BS, no masses, no  tenderness , abdomen and wound dressed LIVER/SPLEEN: no hepatomegaly, no splenomegaly MUSCULOSKELETAL: HEAD: normal to inspection & palpation BACK: no kyphosis, scoliosis or spinal processes tenderness EXTREMITIES: LEFT UPPER EXTREMITY: full range of motion, normal strength & tone RIGHT UPPER EXTREMITY:  full range of motion, normal strength & tone LEFT LOWER EXTREMITY:  full range of motion, normal strength & tone RIGHT LOWER EXTREMITY:  full range of motion, normal strength & tone PSYCHIATRIC:  the patient is alert & oriented to person, affect & behavior appropriate  LABS/RADIOLOGY:  Labs reviewed: Basic Metabolic Panel:  Recent Labs  04/24/13 0530 04/27/13 1020 05/01/13 0920  NA 139 137 137  K 4.4 4.2 3.8  CL 101 98 96  CO2 25 25 24   GLUCOSE 104* 153* 167*  BUN 6 6 15   CREATININE 0.51 0.56 0.88  CALCIUM 8.4 9.0 9.2   Liver Function Tests:  Recent Labs  10/30/12 1325  AST 21  ALT 19  ALKPHOS 71  BILITOT 0.30  PROT 7.9  ALBUMIN 3.7   CBC:  Recent Labs  04/24/13 0530 04/27/13 1020 05/01/13 0920  WBC 12.3* 11.6* 7.1  NEUTROABS 9.0* 8.6* 4.6  HGB 9.2* 10.1* 11.0*  HCT 27.6* 30.5* 32.9*  MCV 86.0 86.4 85.0  PLT 354 555* 523*   Lipid Panel:  Recent Labs  04/02/13 0919  HDL 56.50   CBG:  Recent Labs  04/17/13 1802 04/17/13 2340 04/18/13 0601  GLUCAP 120* 131* 135*    CHEST  2 VIEW   COMPARISON:  None.   FINDINGS: The heart size and mediastinal contours are within normal limits. Both lungs are clear. The visualized skeletal structures are unremarkable.   IMPRESSION: No active cardiopulmonary disease.     CT ABDOMEN AND PELVIS WITH CONTRAST   TECHNIQUE: Multidetector CT imaging of the abdomen and pelvis was performed using the standard protocol following bolus administration of intravenous contrast.   CONTRAST:  30mL OMNIPAQUE IOHEXOL 300 MG/ML  SOLN   COMPARISON:  11/06/2012   FINDINGS: Images through the lung bases show mild infiltrate or atelectasis in the posterior right lower lobe which is new since previous study. Pneumonia cannot be excluded.   Anastomotic staple seen at the rectosigmoid junction. Some extraluminal air is seen within the central sigmoid mesocolon and a right pelvic fluid collection containing an air-fluid level is seen which measures 2.9 x 5.3 cm on image 62. This is suspicious for abscess. There is no evidence of oral contrast leak or extravasation. No evidence of free intraperitoneal air. No  evidence of bowel obstruction   Gas is noted in the urinary bladder, but no definite fistula identified. This may be due to recent bladder catheterization. No other inflammatory process or abnormal fluid collections identified.   Tiny cyst seen in the anterior left hepatic lobe which is stable. No liver masses are identified. The gallbladder, pancreas, spleen, and adrenal glands are normal in appearance. Tiny renal cysts are again noted, however there is no evidence of a renal mass or hydronephrosis. No lymphadenopathy identified.   IMPRESSION: 5.3 cm right pelvic fluid collection, suspicious for postop abscess. Extraluminal gas also noted in sigmoid mesocolon, however no oral contrast extravasation visualized.   Small amount of gas noted in urinary bladder. Recommend clinical correlation for recent bladder catheterization. If none, a colo-vesicle fistula or gas-forming urinary tract infection would be considerations, and correlation with urinalysis suggested.   Mild infiltrate or atelectasis in posterior right lower lobe.   SINUS TRACT INJECTION/FISTULOGRAM   Physician: Stephan Minister. Anselm Pancoast,  MD   MEDICATIONS: None   ANESTHESIA/SEDATION: Moderate sedation time: None   FLUOROSCOPY TIME:  2 min and 6 seconds   PROCEDURE: The patient was placed supine on the interventional table. Contrast was injected through the percutaneous drainage catheter with fluoroscopy. The contrast was aspirated at the end of the procedure and the catheter was flushed with normal saline. Catheter was attached to the gravity bag.   COMPLICATIONS: None   FINDINGS: Pigtail catheter in the pelvis, just right of midline. The initial images demonstrate filling of a small collection around the drain. Additional contrast demonstrates contrast pooling around a bowel structure. Delayed images demonstrate contrast filling the distal rectum.   IMPRESSION: Study is positive for a fistulous connection between  the abscess cavity and the rectum.    ASSESSMENT/PLAN:  Postsurgical abdominal wound infection-continue wound care. Hypertension-well controlled Osteoporosis-continue current medications Anemia of chronic disease-recheck Hyperglycemia-recheck Hyperlipidemia-continue Pravachol Check CBC and BMP  I have reviewed patient's medical records received at admission/from hospitalization.  CPT CODE: 49449  Gayani Y Dasanayaka, [place] [phone]

## 2013-05-21 ENCOUNTER — Other Ambulatory Visit: Payer: Self-pay | Admitting: Adult Health

## 2013-05-21 ENCOUNTER — Telehealth (INDEPENDENT_AMBULATORY_CARE_PROVIDER_SITE_OTHER): Payer: Self-pay | Admitting: *Deleted

## 2013-05-21 DIAGNOSIS — Z853 Personal history of malignant neoplasm of breast: Secondary | ICD-10-CM

## 2013-05-21 DIAGNOSIS — E2839 Other primary ovarian failure: Secondary | ICD-10-CM

## 2013-05-21 NOTE — Telephone Encounter (Signed)
I spoke with pt and informed her of the appt for her to have her drain checked at [facility] on 05/24/13 with an arrival time of 10:30am.  She is agreeable with this appt.

## 2013-05-22 ENCOUNTER — Other Ambulatory Visit: Payer: Self-pay | Admitting: Adult Health

## 2013-05-22 DIAGNOSIS — Z853 Personal history of malignant neoplasm of breast: Secondary | ICD-10-CM

## 2013-05-23 ENCOUNTER — Ambulatory Visit: Payer: Medicare Other | Admitting: Internal Medicine

## 2013-05-24 ENCOUNTER — Ambulatory Visit (HOSPITAL_COMMUNITY): Payer: Medicare Other

## 2013-05-28 ENCOUNTER — Encounter (INDEPENDENT_AMBULATORY_CARE_PROVIDER_SITE_OTHER): Payer: Self-pay | Admitting: General Surgery

## 2013-05-28 ENCOUNTER — Ambulatory Visit (HOSPITAL_COMMUNITY)
Admission: RE | Admit: 2013-05-28 | Discharge: 2013-05-28 | Disposition: A | Discharge status: Home / Self Care | Payer: Medicare Other | Source: Ambulatory Visit | Attending: General Surgery | Admitting: General Surgery

## 2013-05-28 DIAGNOSIS — K5732 Diverticulitis of large intestine without perforation or abscess without bleeding: Secondary | ICD-10-CM

## 2013-05-28 DIAGNOSIS — K632 Fistula of intestine: Secondary | ICD-10-CM | POA: Insufficient documentation

## 2013-05-28 MED ORDER — IOHEXOL 300 MG/ML  SOLN
50.0000 mL | Freq: Once | INTRAMUSCULAR | Status: AC | PRN
  Administered 2013-05-28: 8 mL

## 2013-05-29 ENCOUNTER — Telehealth (INDEPENDENT_AMBULATORY_CARE_PROVIDER_SITE_OTHER): Payer: Self-pay

## 2013-05-29 NOTE — Telephone Encounter (Signed)
Called pt and went over her questions. Keep drain for now. Will restudy in 2-3 weeks. [facility] only needs to come out 3x week as long as she has help with dressings. She will discuss this with her HHN. Can stop cipro. Will see her on 3/16 for follow up.

## 2013-05-29 NOTE — Telephone Encounter (Signed)
Message copied by Carlene Coria on Tue May 29, 2013  8:48 AM ------      Message from: Hunt Oris      Created: Mon May 28, 2013 12:25 PM      Regarding: Patient Requesting a Call Back      Contact: [phone]       Patient said she didn't need to speak to the Triage Unit, but she does have questions for Dr. Marlou Starks in regards to:             Drain in right butt cheek- issues      Meds not working      Home health aide needed      New RX because what pt is using now is not working            df ------

## 2013-05-30 NOTE — Telephone Encounter (Signed)
Demeka called back to let us know that patient has been approved to continue daily dressing changes until her next appt on 06/11/13 with Dr. Marlou Starks.

## 2013-05-30 NOTE — Telephone Encounter (Signed)
Demeka with [facility] called because patient is not stating that she doesn't feel comfortable or think it's fair that [place] only comes out 3 times per week and her friends have to do the dressing changes 4 times per week.  I explained to [facility] that we will back them up on whatever order they are willing to do or patient's insurance is willing to pay for.  I explained that if patient's insurance will approve her for daily dressing changes and they have the staff for it then we are fine with that but if they can't go out daily or her insurance won't pay for it then patient needs to find someone that can do her dressing changes.  I explained that I know in other cases sometimes the patient has to pay out of pocket for additional visits if her insurance won't cover what she is wanting.  I explained that we do not perform dressing changes here in the office especially when patient has access to help with her dressing changes.  Demeka states understanding and agreeable with the plan at this time.

## 2013-06-11 ENCOUNTER — Encounter (INDEPENDENT_AMBULATORY_CARE_PROVIDER_SITE_OTHER): Payer: Self-pay | Admitting: General Surgery

## 2013-06-11 ENCOUNTER — Ambulatory Visit (INDEPENDENT_AMBULATORY_CARE_PROVIDER_SITE_OTHER): Payer: Medicare Other | Admitting: General Surgery

## 2013-06-11 ENCOUNTER — Telehealth (INDEPENDENT_AMBULATORY_CARE_PROVIDER_SITE_OTHER): Payer: Self-pay

## 2013-06-11 VITALS — BP 126/74 | HR 72 | Temp 97.5°F | Resp 16 | Ht 62.0 in | Wt 142.0 lb

## 2013-06-11 DIAGNOSIS — L089 Local infection of the skin and subcutaneous tissue, unspecified: Secondary | ICD-10-CM

## 2013-06-11 DIAGNOSIS — T148XXA Other injury of unspecified body region, initial encounter: Secondary | ICD-10-CM

## 2013-06-11 DIAGNOSIS — K5732 Diverticulitis of large intestine without perforation or abscess without bleeding: Secondary | ICD-10-CM

## 2013-06-11 NOTE — Patient Instructions (Addendum)
Will restudy drain next week and remove Continue daily dressing changes

## 2013-06-11 NOTE — Telephone Encounter (Signed)
Spoke to [place] at [place] and gave verbal order that [facility] can continue with daily dressing changes. [place] will pass message along to [facility]

## 2013-06-12 NOTE — Telephone Encounter (Signed)
Tried to call patient today to set her up for her drain check/pull. No answer and no VM. JM

## 2013-06-14 ENCOUNTER — Telehealth (INDEPENDENT_AMBULATORY_CARE_PROVIDER_SITE_OTHER): Payer: Self-pay | Admitting: *Deleted

## 2013-06-14 NOTE — Telephone Encounter (Signed)
I spoke with pt and informed her of the appt for her drain pull at [facility] on 06/19/13 with an arrival time of 10:30am.  She is agreeable with this appt.

## 2013-06-18 ENCOUNTER — Telehealth (INDEPENDENT_AMBULATORY_CARE_PROVIDER_SITE_OTHER): Payer: Self-pay

## 2013-06-18 NOTE — Telephone Encounter (Signed)
Called pt back and told her to keep the appt because Dr Marlou Starks put on the order for IR to pull the drain tomorrow no matter what the results are. She needs this area studied anyway. Advised that Dr Marlou Starks is on call tomorrow all day so if IR sees any issue with the drain being pulled they will page him and speak with him directly. Pt understands these instructions.

## 2013-06-18 NOTE — Telephone Encounter (Signed)
Message copied by Carlene Coria on Mon Jun 18, 2013  4:58 PM ------      Message from: Jeanella Craze      Created: Mon Jun 18, 2013 11:10 AM      Regarding: Tiffany Hood      Contact: [phone]       Patient is supposed to go to [facility] to remove a drain from right buttock. The drain is still producing some fluid. She's unsure what to do. Do you want her to cancel the appointment for tomorrow at 10:30 on 06/19/13 at [facility] in the lab or keep this appointment. The drainage started this past Saturday. Please call to discuss. Thank you. ------

## 2013-06-19 ENCOUNTER — Ambulatory Visit (HOSPITAL_COMMUNITY)
Admission: RE | Admit: 2013-06-19 | Discharge: 2013-06-19 | Disposition: A | Discharge status: Home / Self Care | Payer: Medicare Other | Source: Ambulatory Visit | Attending: General Surgery | Admitting: General Surgery

## 2013-06-19 DIAGNOSIS — T148XXA Other injury of unspecified body region, initial encounter: Secondary | ICD-10-CM

## 2013-06-19 DIAGNOSIS — K632 Fistula of intestine: Secondary | ICD-10-CM | POA: Insufficient documentation

## 2013-06-19 DIAGNOSIS — L089 Local infection of the skin and subcutaneous tissue, unspecified: Secondary | ICD-10-CM

## 2013-06-19 DIAGNOSIS — K5732 Diverticulitis of large intestine without perforation or abscess without bleeding: Secondary | ICD-10-CM

## 2013-06-19 MED ORDER — IOHEXOL 300 MG/ML  SOLN
50.0000 mL | Freq: Once | INTRAMUSCULAR | Status: AC | PRN
  Administered 2013-06-19: 10 mL

## 2013-06-19 NOTE — Procedures (Signed)
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

## 2013-07-03 ENCOUNTER — Ambulatory Visit (INDEPENDENT_AMBULATORY_CARE_PROVIDER_SITE_OTHER): Payer: Medicare Other | Admitting: General Surgery

## 2013-07-03 ENCOUNTER — Encounter (INDEPENDENT_AMBULATORY_CARE_PROVIDER_SITE_OTHER): Payer: Self-pay | Admitting: General Surgery

## 2013-07-03 VITALS — BP 118/80 | HR 75 | Temp 97.8°F | Ht 62.0 in | Wt 145.0 lb

## 2013-07-03 DIAGNOSIS — K5732 Diverticulitis of large intestine without perforation or abscess without bleeding: Secondary | ICD-10-CM

## 2013-07-03 NOTE — Progress Notes (Signed)
Subjective:     Patient ID: Tiffany Hood, female   DOB: [birth year], 73 y.o.   MRN: [id number]  HPI The patient is a 73 year old white female who was just over 2 months out from a sigmoid colectomy that was complicated by a small delayed leak that is well drained with a percutaneous drain. She has no systemic signs of infection. Her main complaint is of pain at the site where the drain entered her buttock area  Review of Systems     Objective:   Physical Exam On exam her abdomen is soft and nontender. The open portions of the midline wound are very clean and shallow now. There is minimal drainage coming from her percutaneous drain    Assessment:     The patient is just over 2 months out from a sigmoid colectomy complicated by a small delayed leak     Plan:     At this point we will continue with percutaneous drainage. I will have her restudied in the next couple weeks. If her fistula track never does completely closed and we may have to consider diversion of stool from the area at some point to see if this area will heal. I have begun this conversation with her but at this point we will continue with conservative management

## 2013-07-03 NOTE — Progress Notes (Signed)
Subjective:     Patient ID: Tiffany Hood, female   DOB: 1940-09-16, 73 y.o.   MRN: [id number]  HPI The patient is a 73 year old white female who is almost 2 months status post sigmoid colectomy for diverticulitis with a possible fistula to her vaginal cuff. She initially did well and then developed a fluid collection near the rectosigmoid. This was drained percutaneously. The drainage cleared but when the drain was restudied there was a small fistula tract to the sigmoid colon. She has For drainage and since then. She is having minimal brownish drainage from the area. She has not had any fevers or chills. Her only pain is that the drain entry site. Her appetite has been good and her bowels are working normally  Review of Systems     Objective:   Physical Exam On exam her abdomen is soft and nontender. The 2 open areas along her midline incision are clean with good granulation tissue.    Assessment:     The patient is almost 2 months status post sigmoid colectomy complicated by a probable small leak     Plan:     At this point we will continue to keep the drain in. I will plan to restudy her in the next couple weeks. We will plan to see her back in one month to check her progress

## 2013-07-03 NOTE — Patient Instructions (Signed)
Will restudy in a couple weeks Shower daily and pack wound with gauze

## 2013-07-04 ENCOUNTER — Telehealth (INDEPENDENT_AMBULATORY_CARE_PROVIDER_SITE_OTHER): Payer: Self-pay | Admitting: *Deleted

## 2013-07-04 ENCOUNTER — Other Ambulatory Visit (INDEPENDENT_AMBULATORY_CARE_PROVIDER_SITE_OTHER): Payer: Self-pay

## 2013-07-04 DIAGNOSIS — K5732 Diverticulitis of large intestine without perforation or abscess without bleeding: Secondary | ICD-10-CM

## 2013-07-04 DIAGNOSIS — L089 Local infection of the skin and subcutaneous tissue, unspecified: Secondary | ICD-10-CM

## 2013-07-04 DIAGNOSIS — T148XXA Other injury of unspecified body region, initial encounter: Secondary | ICD-10-CM

## 2013-07-04 NOTE — Telephone Encounter (Signed)
Called pt and gave her appt information for IR Sinus/Fist Tube Check/ called [employer] and no precert is required.Marland Kitchenjkw

## 2013-07-12 ENCOUNTER — Encounter (HOSPITAL_COMMUNITY): Payer: Self-pay | Admitting: General Surgery

## 2013-07-16 ENCOUNTER — Other Ambulatory Visit (INDEPENDENT_AMBULATORY_CARE_PROVIDER_SITE_OTHER): Payer: Self-pay

## 2013-07-16 ENCOUNTER — Telehealth (INDEPENDENT_AMBULATORY_CARE_PROVIDER_SITE_OTHER): Payer: Self-pay

## 2013-07-16 DIAGNOSIS — K5732 Diverticulitis of large intestine without perforation or abscess without bleeding: Secondary | ICD-10-CM

## 2013-07-16 MED ORDER — CIPROFLOXACIN HCL 500 MG PO TABS: 500.0000 mg | ORAL_TABLET | Freq: Two times a day (BID) | ORAL | Status: AC

## 2013-07-16 MED ORDER — METRONIDAZOLE 250 MG PO TABS: 250.0000 mg | ORAL_TABLET | Freq: Two times a day (BID) | ORAL | Status: AC

## 2013-07-16 NOTE — Telephone Encounter (Signed)
Called pt to let her know I sent below scripts to pharmacy.

## 2013-07-16 NOTE — Telephone Encounter (Signed)
Message copied by Carlene Coria on Mon Jul 16, 2013  2:25 PM ------      Message from: Luella Cook III      Created: Thu Jul 12, 2013  4:05 AM       i want to put her back on cipro and flagyl for the next couple months and see her back about 2 weeks after she starts the abx ------

## 2013-07-20 ENCOUNTER — Ambulatory Visit (HOSPITAL_COMMUNITY)
Admission: RE | Admit: 2013-07-20 | Discharge: 2013-07-20 | Disposition: A | Discharge status: Home / Self Care | Payer: Medicare Other | Source: Ambulatory Visit | Attending: General Surgery | Admitting: General Surgery

## 2013-07-20 DIAGNOSIS — K632 Fistula of intestine: Secondary | ICD-10-CM | POA: Insufficient documentation

## 2013-07-20 DIAGNOSIS — K5732 Diverticulitis of large intestine without perforation or abscess without bleeding: Secondary | ICD-10-CM

## 2013-07-20 MED ORDER — IOHEXOL 300 MG/ML  SOLN
50.0000 mL | Freq: Once | INTRAMUSCULAR | Status: AC | PRN
  Administered 2013-07-20: 4 mL

## 2013-07-31 ENCOUNTER — Ambulatory Visit (INDEPENDENT_AMBULATORY_CARE_PROVIDER_SITE_OTHER): Payer: Medicare Other | Admitting: General Surgery

## 2013-07-31 ENCOUNTER — Encounter (INDEPENDENT_AMBULATORY_CARE_PROVIDER_SITE_OTHER): Payer: Self-pay | Admitting: General Surgery

## 2013-07-31 VITALS — BP 124/74 | HR 80 | Temp 97.5°F | Ht 62.0 in | Wt 147.8 lb

## 2013-07-31 DIAGNOSIS — K5732 Diverticulitis of large intestine without perforation or abscess without bleeding: Secondary | ICD-10-CM

## 2013-07-31 NOTE — Progress Notes (Signed)
Subjective:     Patient ID: Tiffany Hood, female   DOB: 1941/02/22, 73 y.o.   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 Her postoperative course was complicated by a small delayed contained leak. She continues to have a percutaneous drain in place. She otherwise feels well. She denies any fevers or chills. Her appetite is good and her bowels are working normally.  Review of Systems     Objective:   Physical Exam On exam her abdomen is soft and nontender. Her midline wound has completely healed now. Her drain is putting out a very small amount of light brown liquid. Her most recent drain study showed resolution of the abscess cavity but a persistent connection to the rectum    Assessment:     The patient is 3 months status post sigmoid colectomy complicated by a small contained leak     Plan:     At this point she has been on antibiotics for a couple weeks now. I will plan to have the drain removed since there is minimal output in the next week or so. If she gets sick once the drain is removed then we may have to put her in the hospital and consider surgical diversion from the area

## 2013-07-31 NOTE — Patient Instructions (Signed)
Will remove drain next week Continue abx for new couple months

## 2013-08-07 ENCOUNTER — Ambulatory Visit (HOSPITAL_COMMUNITY)
Admission: RE | Admit: 2013-08-07 | Discharge: 2013-08-07 | Disposition: A | Discharge status: Home / Self Care | Payer: Medicare Other | Source: Ambulatory Visit | Attending: General Surgery | Admitting: General Surgery

## 2013-08-07 ENCOUNTER — Other Ambulatory Visit (INDEPENDENT_AMBULATORY_CARE_PROVIDER_SITE_OTHER): Payer: Self-pay | Admitting: General Surgery

## 2013-08-07 DIAGNOSIS — Z4682 Encounter for fitting and adjustment of non-vascular catheter: Secondary | ICD-10-CM | POA: Insufficient documentation

## 2013-08-07 DIAGNOSIS — K5732 Diverticulitis of large intestine without perforation or abscess without bleeding: Secondary | ICD-10-CM

## 2013-08-07 MED ORDER — IOHEXOL 300 MG/ML  SOLN: 50.0000 mL | Freq: Once | INTRAMUSCULAR | Status: AC | PRN

## 2013-08-07 NOTE — Progress Notes (Signed)
Patient ID: Tiffany Hood, female   DOB: April 23, 1940, 73 y.o.   MRN: [id number] Patient has a chronic pelvic drainage catheter.  The catheter was removed without complication.

## 2013-08-27 ENCOUNTER — Encounter (INDEPENDENT_AMBULATORY_CARE_PROVIDER_SITE_OTHER): Payer: Self-pay | Admitting: General Surgery

## 2013-08-27 ENCOUNTER — Ambulatory Visit (INDEPENDENT_AMBULATORY_CARE_PROVIDER_SITE_OTHER): Payer: Medicare Other | Admitting: General Surgery

## 2013-08-27 VITALS — BP 128/72 | HR 68 | Temp 97.2°F | Ht 62.0 in | Wt 151.0 lb

## 2013-08-27 DIAGNOSIS — K5732 Diverticulitis of large intestine without perforation or abscess without bleeding: Secondary | ICD-10-CM

## 2013-08-27 NOTE — Progress Notes (Signed)
Subjective:     Patient ID: Tiffany Hood, female   DOB: [birth year], 73 y.o.   MRN: [id number]  HPI The patient is a 73 year old white female who is almost 5 months out from a sigmoid colectomy. Her course was complicated by a small contained a delayed leak from her colon. Her drain had been draining minimal amounts of fluid so almost one month ago we removed her drain after she had been on Cipro and Flagyl for a couple weeks. She has done well with the drain out. She denies any significant abdominal pain. Her appetite is good and her bowels are moving about 4-5 times a day but they are not diarrhea. She denies any fevers or chills.  Review of Systems     Objective:   Physical Exam On exam her abdomen is soft and nontender. Her midline incision has healed nicely.    Assessment:     The patient is almost 5 months status post sigmoid colectomy complicated by a small contained leak. Her drain is now out.     Plan:     At this point she will continue on Cipro and Flagyl. I will plan to see her back in 2 months at which time we will obtain a Gastrografin enema to see if there is any evidence of leak.

## 2013-08-27 NOTE — Patient Instructions (Addendum)
Continue cipro and flagyl for at least another 2 months then we will get a gastrograffin enema study

## 2013-09-04 ENCOUNTER — Ambulatory Visit
Admission: RE | Admit: 2013-09-04 | Discharge: 2013-09-04 | Disposition: A | Discharge status: Home / Self Care | Payer: Medicare Other | Source: Ambulatory Visit | Attending: Adult Health | Admitting: Adult Health

## 2013-09-04 DIAGNOSIS — Z853 Personal history of malignant neoplasm of breast: Secondary | ICD-10-CM

## 2013-09-04 DIAGNOSIS — E2839 Other primary ovarian failure: Secondary | ICD-10-CM

## 2013-09-12 ENCOUNTER — Telehealth: Payer: Self-pay

## 2013-09-12 NOTE — Telephone Encounter (Signed)
Received by mail bone scan dtd 09/04/13 from [facility] [place].  Original to scan.  Copy to [place].

## 2013-09-17 ENCOUNTER — Telehealth (INDEPENDENT_AMBULATORY_CARE_PROVIDER_SITE_OTHER): Payer: Self-pay | Admitting: General Surgery

## 2013-09-17 NOTE — Telephone Encounter (Signed)
Patient calling in wanting to know if Dr. Marlou Starks would be willing to call her in a Rx for a yeast infection.  She has acquired this since she started her Rx for cipro and flagyl for the diverticulitis.  Informed her that Dr. Marlou Starks is not in the office until tomorrow, she explained she was happy to wait to hear from him until tomorrow.

## 2013-09-18 ENCOUNTER — Other Ambulatory Visit (INDEPENDENT_AMBULATORY_CARE_PROVIDER_SITE_OTHER): Payer: Self-pay | Admitting: General Surgery

## 2013-09-18 DIAGNOSIS — K5732 Diverticulitis of large intestine without perforation or abscess without bleeding: Secondary | ICD-10-CM

## 2013-09-18 MED ORDER — FLUCONAZOLE 200 MG PO TABS: 200.0000 mg | ORAL_TABLET | Freq: Every day | ORAL | Status: DC

## 2013-09-18 NOTE — Telephone Encounter (Signed)
Called pt to let her know her prescription was sent to her pharmacy. Advised her to keep taking cipro and flagyl until her July appt with Dr Marlou Starks.

## 2013-09-24 ENCOUNTER — Other Ambulatory Visit (INDEPENDENT_AMBULATORY_CARE_PROVIDER_SITE_OTHER): Payer: Self-pay | Admitting: General Surgery

## 2013-09-27 ENCOUNTER — Other Ambulatory Visit (INDEPENDENT_AMBULATORY_CARE_PROVIDER_SITE_OTHER): Payer: Self-pay

## 2013-09-27 MED ORDER — CIPROFLOXACIN HCL 500 MG PO TABS: 500.0000 mg | ORAL_TABLET | Freq: Two times a day (BID) | ORAL | Status: DC

## 2013-09-27 NOTE — Telephone Encounter (Signed)
meds already faxed to pharmacy

## 2013-10-23 ENCOUNTER — Telehealth: Payer: Self-pay | Admitting: Hematology and Oncology

## 2013-10-23 NOTE — Telephone Encounter (Signed)
KK PT MOVED TO [facility]. NOT ABLE TO REACH PT RE NEW APPT D/T. NO ANSWER/VM ON HOME PHONE AND CELL VM FULL. SCHEDULE MAILED.

## 2013-10-26 ENCOUNTER — Encounter (INDEPENDENT_AMBULATORY_CARE_PROVIDER_SITE_OTHER): Payer: Medicare Other | Admitting: General Surgery

## 2013-10-29 ENCOUNTER — Encounter (INDEPENDENT_AMBULATORY_CARE_PROVIDER_SITE_OTHER): Payer: Medicare Other | Admitting: General Surgery

## 2013-11-01 ENCOUNTER — Other Ambulatory Visit: Payer: Medicare Other

## 2013-11-01 ENCOUNTER — Ambulatory Visit: Payer: Medicare Other | Admitting: Oncology

## 2013-11-06 ENCOUNTER — Other Ambulatory Visit (INDEPENDENT_AMBULATORY_CARE_PROVIDER_SITE_OTHER): Payer: Self-pay | Admitting: *Deleted

## 2013-11-06 ENCOUNTER — Telehealth (INDEPENDENT_AMBULATORY_CARE_PROVIDER_SITE_OTHER): Payer: Self-pay | Admitting: *Deleted

## 2013-11-06 ENCOUNTER — Ambulatory Visit (INDEPENDENT_AMBULATORY_CARE_PROVIDER_SITE_OTHER): Payer: Medicare Other | Admitting: General Surgery

## 2013-11-06 VITALS — BP 130/78 | HR 90 | Temp 97.7°F | Ht 62.0 in | Wt 155.0 lb

## 2013-11-06 DIAGNOSIS — K5732 Diverticulitis of large intestine without perforation or abscess without bleeding: Secondary | ICD-10-CM

## 2013-11-06 NOTE — Progress Notes (Signed)
Subjective:     Patient ID: Tiffany Hood, female   DOB: [birth year], 73 y.o.   MRN: [id number]  HPI The patient is a 73 year old white female who is about 7 months status post sigmoid colectomy for diverticulitis. Her course was complicated by delayed leak from her anastomosis. This required percutaneous drainage and antibiotics. The drain has now been out for a couple months and she is on a single dose of antibiotics a day. She denies any abdominal pain. Her appetite is good and her bowels are moving about 3-4 times a day. She denies any fevers or chills  Review of Systems     Objective:   Physical Exam On exam her abdomen is soft and nontender. Her midline incision has completely healed.    Assessment:     The patient is several months status post sigmoid colectomy complicated by delayed leak.     Plan:     At this point I will study her with a Gastrografin enema study. If there is no evidence of leak then I think she will be able to stop her antibiotics and we will plan to see her back on a when necessary basis

## 2013-11-06 NOTE — Patient Instructions (Signed)
Add probiotic

## 2013-11-06 NOTE — Telephone Encounter (Signed)
LMOM for pt to return my call regarding her appt for DG Colon.  Advise pt it has been scheduled at [facility], 11-08-13 arriving at 9:15 a.m.  There is no PREP required. If pt needs to reschedule, please have her call [phone].   Thanks!  Anderson Malta

## 2013-11-07 ENCOUNTER — Telehealth (INDEPENDENT_AMBULATORY_CARE_PROVIDER_SITE_OTHER): Payer: Self-pay | Admitting: *Deleted

## 2013-11-07 NOTE — Telephone Encounter (Signed)
Pt is scheduled for DG Colon/ Gastragrafin Enema on 11-12-13.  She said [facility] called and told her that she needed a colon prep.  Pt didn't think she needed one and wasn't given one at the apt.  Please advise if pt needs one, and what does she need?  Thanks!  Anderson Malta

## 2013-11-08 NOTE — Telephone Encounter (Signed)
Tiffany Hood and I called Tiffany Hood over at [facility] radiology and asked about prep pt says she was told to take. Tiffany Hood verified no prep is needed. Called pt and let her know per [facility] radiology, no prep needed.

## 2013-11-12 ENCOUNTER — Ambulatory Visit (HOSPITAL_COMMUNITY)
Admission: RE | Admit: 2013-11-12 | Discharge: 2013-11-12 | Disposition: A | Discharge status: Home / Self Care | Payer: Medicare Other | Source: Ambulatory Visit | Attending: General Surgery | Admitting: General Surgery

## 2013-11-12 DIAGNOSIS — K5732 Diverticulitis of large intestine without perforation or abscess without bleeding: Secondary | ICD-10-CM

## 2013-11-12 DIAGNOSIS — K630 Abscess of intestine: Secondary | ICD-10-CM | POA: Diagnosis present

## 2013-11-12 MED ORDER — IOHEXOL 300 MG/ML  SOLN
450.0000 mL | Freq: Once | INTRAMUSCULAR | Status: AC | PRN
  Administered 2013-11-12: 150 mL via INTRAVENOUS

## 2013-11-22 ENCOUNTER — Other Ambulatory Visit (INDEPENDENT_AMBULATORY_CARE_PROVIDER_SITE_OTHER): Payer: Medicare Other

## 2013-11-22 ENCOUNTER — Telehealth (INDEPENDENT_AMBULATORY_CARE_PROVIDER_SITE_OTHER): Payer: Self-pay

## 2013-11-22 DIAGNOSIS — R7303 Prediabetes: Secondary | ICD-10-CM

## 2013-11-22 DIAGNOSIS — R7309 Other abnormal glucose: Secondary | ICD-10-CM

## 2013-11-22 LAB — MICROALBUMIN / CREATININE URINE RATIO
CREATININE, U: 207.3 mg/dL
Microalb Creat Ratio: 0.6 mg/g (ref 0.0–30.0)
Microalb, Ur: 1.2 mg/dL (ref 0.0–1.9)

## 2013-11-22 LAB — HEMOGLOBIN A1C: HEMOGLOBIN A1C: 6.4 % (ref 4.6–6.5)

## 2013-11-22 NOTE — Telephone Encounter (Signed)
Message copied by Carlene Coria on Thu Nov 22, 2013  4:57 PM ------      Message from: Luella Cook III      Created: Sun Nov 18, 2013  3:00 PM       No more pocket. She may stop her antibiotics ------

## 2013-11-22 NOTE — Telephone Encounter (Signed)
LMOM for pt to call back. Please give below msg.

## 2013-11-23 NOTE — Telephone Encounter (Signed)
Informed pt of message below. Pt verbalized understanding  

## 2014-01-02 ENCOUNTER — Encounter: Payer: Self-pay | Admitting: Internal Medicine

## 2014-01-03 ENCOUNTER — Telehealth: Payer: Self-pay

## 2014-01-03 NOTE — Telephone Encounter (Signed)
No eye exam this year.  Plans to schedule.

## 2014-01-28 ENCOUNTER — Other Ambulatory Visit: Payer: Self-pay | Admitting: *Deleted

## 2014-01-28 DIAGNOSIS — Z853 Personal history of malignant neoplasm of breast: Secondary | ICD-10-CM

## 2014-01-29 ENCOUNTER — Telehealth: Payer: Self-pay | Admitting: Hematology and Oncology

## 2014-01-29 ENCOUNTER — Other Ambulatory Visit (HOSPITAL_BASED_OUTPATIENT_CLINIC_OR_DEPARTMENT_OTHER): Payer: Medicare Other

## 2014-01-29 ENCOUNTER — Ambulatory Visit (HOSPITAL_BASED_OUTPATIENT_CLINIC_OR_DEPARTMENT_OTHER): Payer: Medicare Other | Admitting: Hematology and Oncology

## 2014-01-29 DIAGNOSIS — Z853 Personal history of malignant neoplasm of breast: Secondary | ICD-10-CM

## 2014-01-29 LAB — COMPREHENSIVE METABOLIC PANEL (CC13)
ALK PHOS: 82 U/L (ref 40–150)
ALT: 16 U/L (ref 0–55)
AST: 17 U/L (ref 5–34)
Albumin: 3.9 g/dL (ref 3.5–5.0)
Anion Gap: 11 mEq/L (ref 3–11)
BILIRUBIN TOTAL: 0.2 mg/dL (ref 0.20–1.20)
BUN: 19.4 mg/dL (ref 7.0–26.0)
CO2: 26 mEq/L (ref 22–29)
Calcium: 10.4 mg/dL (ref 8.4–10.4)
Chloride: 101 mEq/L (ref 98–109)
Creatinine: 0.8 mg/dL (ref 0.6–1.1)
GLUCOSE: 102 mg/dL (ref 70–140)
POTASSIUM: 3.9 meq/L (ref 3.5–5.1)
SODIUM: 138 meq/L (ref 136–145)
Total Protein: 7.7 g/dL (ref 6.4–8.3)

## 2014-01-29 LAB — CBC WITH DIFFERENTIAL/PLATELET
BASO%: 0.6 % (ref 0.0–2.0)
Basophils Absolute: 0 10*3/uL (ref 0.0–0.1)
EOS ABS: 0.3 10*3/uL (ref 0.0–0.5)
EOS%: 3.4 % (ref 0.0–7.0)
HCT: 40.8 % (ref 34.8–46.6)
HGB: 13.4 g/dL (ref 11.6–15.9)
LYMPH%: 23.9 % (ref 14.0–49.7)
MCH: 28.3 pg (ref 25.1–34.0)
MCHC: 32.9 g/dL (ref 31.5–36.0)
MCV: 86 fL (ref 79.5–101.0)
MONO#: 0.8 10*3/uL (ref 0.1–0.9)
MONO%: 8.8 % (ref 0.0–14.0)
NEUT%: 63.3 % (ref 38.4–76.8)
NEUTROS ABS: 5.5 10*3/uL (ref 1.5–6.5)
Platelets: 234 10*3/uL (ref 145–400)
RBC: 4.75 10*6/uL (ref 3.70–5.45)
RDW: 14.9 % — AB (ref 11.2–14.5)
WBC: 8.7 10*3/uL (ref 3.9–10.3)
lymph#: 2.1 10*3/uL (ref 0.9–3.3)

## 2014-01-29 NOTE — Progress Notes (Signed)
Patient Care Team: Hendricks Limes, MD as PCP - General (Internal Medicine) Haywood Lasso, MD as Surgeon (General Surgery) Eston Esters, MD as Consulting Physician (Hematology and Oncology)  DIAGNOSIS: 73 year old female with history of breast cancer diagnosed in 2004  PRIOR THERAPY: #1 T1c N0 breast cancer status post lumpectomy 05/07/2002. She went on to complete radiation therapy adjuvantly on 08/19/2012.  #2 patient has been on Arimidex 1 mg daily since June 2004.  CURRENT THERAPY: observation CHIEF COMPLIANT: annual followup of breast cancer  INTERVAL HISTORY: Tiffany Hood is a 73 year old Caucasian with above-mentioned history of breast cancer that was diagnosed in 2004 status post lumpectomy and radiation and received Arimidex for 10 years completed in 2014. She continues to do annual mammograms and followup. She had osteoporosis and that was the reason why she stopped the Arimidex. Since stopping Arimidex, her bone density had improved significantly. She has been on alendronate daily. She denies any new lumps or nodules and feels relatively well and healthy.  REVIEW OF SYSTEMS:   Constitutional: Denies fevers, chills or abnormal weight loss Eyes: Denies blurriness of vision Ears, nose, mouth, throat, and face: Denies mucositis or sore throat Respiratory: Denies cough, dyspnea or wheezes Cardiovascular: Denies palpitation, chest discomfort or lower extremity swelling Gastrointestinal:  Denies nausea, heartburn or change in bowel habits Skin: Denies abnormal skin rashes Lymphatics: Denies new lymphadenopathy or easy bruising Neurological:Denies numbness, tingling or new weaknesses Behavioral/Psych: Mood is stable, no new changes  Breast:  denies any pain or lumps or nodules in either breasts All other systems were reviewed with the patient and are negative.  I have reviewed the past medical history, past surgical history, social history and family history with the  patient and they are unchanged from previous note.  ALLERGIES:  has No Known Allergies.  MEDICATIONS:  Current Outpatient Prescriptions  Medication Sig Dispense Refill  . alendronate (FOSAMAX) 70 MG tablet Take 70 mg by mouth every Saturday. Take with a full glass of water on an empty stomach.    Marland Kitchen aspirin 81 MG tablet Take 81 mg by mouth every Monday, Wednesday, and Friday.     . Calcium Carbonate-Vitamin D (CALTRATE 600+D PO) Take 1 capsule by mouth daily.     . ciprofloxacin (CIPRO) 500 MG tablet Take 1 tablet (500 mg total) by mouth 2 (two) times daily. 60 tablet 0  . fluconazole (DIFLUCAN) 200 MG tablet Take 1 tablet (200 mg total) by mouth daily. 10 tablet 2  . losartan (COZAAR) 50 MG tablet Take 50 mg by mouth daily.    . metroNIDAZOLE (FLAGYL) 250 MG tablet     . Multiple Vitamin (MULTIVITAMIN) tablet Take 1 tablet by mouth daily.      . Omega-3 Fatty Acids (FISH OIL) 1000 MG CAPS Take 1,000 mg by mouth daily.     Vladimir Faster Glycol-Propyl Glycol (SYSTANE) 0.4-0.3 % SOLN Place 1 drop into both eyes every morning.    . pravastatin (PRAVACHOL) 40 MG tablet Take 40 mg by mouth daily.    Marland Kitchen triamterene-hydrochlorothiazide (MAXZIDE) 75-50 MG per tablet Take 0.5 tablets by mouth daily.    . vitamin E 400 UNIT capsule Take 400 Units by mouth daily.       No current facility-administered medications for this visit.    PHYSICAL EXAMINATION: ECOG PERFORMANCE STATUS: 0 - Asymptomatic  Filed Vitals:   01/29/14 1535  BP: 143/68  Pulse: 82  Temp: 98.1 F (36.7 C)  Resp: 18   Filed Weights  01/29/14 1535  Weight: 158 lb 4.8 oz (71.804 kg)    GENERAL:alert, no distress and comfortable SKIN: skin color, texture, turgor are normal, no rashes or significant lesions EYES: normal, Conjunctiva are pink and non-injected, sclera clear OROPHARYNX:no exudate, no erythema and lips, buccal mucosa, and tongue normal  NECK: supple, thyroid normal size, non-tender, without nodularity LYMPH:  no  palpable lymphadenopathy in the cervical, axillary or inguinal LUNGS: clear to auscultation and percussion with normal breathing effort HEART: regular rate & rhythm and no murmurs and no lower extremity edema ABDOMEN:abdomen soft, non-tender and normal bowel sounds Musculoskeletal:no cyanosis of digits and no clubbing  NEURO: alert & oriented x 3 with fluent speech, no focal motor/sensory deficits BREAST: No palpable masses or nodules in either right or left breasts. No palpable axillary supraclavicular or infraclavicular adenopathy no breast tenderness or nipple discharge.   LABORATORY DATA:  I have reviewed the data as listed   Chemistry      Component Value Date/Time   NA 138 01/29/2014 1518   NA 137 05/01/2013 0920   K 3.9 01/29/2014 1518   K 3.8 05/01/2013 0920   CL 96 05/01/2013 0920   CO2 26 01/29/2014 1518   CO2 24 05/01/2013 0920   BUN 19.4 01/29/2014 1518   BUN 15 05/01/2013 0920   CREATININE 0.8 01/29/2014 1518   CREATININE 0.88 05/01/2013 0920      Component Value Date/Time   CALCIUM 10.4 01/29/2014 1518   CALCIUM 9.2 05/01/2013 0920   ALKPHOS 82 01/29/2014 1518   ALKPHOS 61 01/19/2012 1220   AST 17 01/29/2014 1518   AST 14 01/19/2012 1220   ALT 16 01/29/2014 1518   ALT 19 01/19/2012 1220   BILITOT 0.20 01/29/2014 1518   BILITOT 0.4 01/19/2012 1220       Lab Results  Component Value Date   WBC 8.7 01/29/2014   HGB 13.4 01/29/2014   HCT 40.8 01/29/2014   MCV 86.0 01/29/2014   PLT 234 01/29/2014   NEUTROABS 5.5 01/29/2014     RADIOGRAPHIC STUDIES: I have personally reviewed the radiology reports and agreed with their findings. No results found.   ASSESSMENT & PLAN:  Hx breast cancer, IDC, left,UIQ, receptor +, her 2 ? Left breast invasive ductal carcinoma diagnosed in 2004 underwent lumpectomy followed by radiation therapy completed 08/20/2002 then started on Arimidex and receive day for 10 years stopping in 2014. Initial tumor was 1.1 cm in size  grade 1 ER/PR positive HER-2 negative T1 C. N0 M0 stage IA  Surveillance: Today's breast exam was normal. I reviewed her mammograms which were normal as well. These were done in June 2015. Patient continued to undergo mammograms annually and physical exams all  Survivorship:Discussed the importance of physical exercise in decreasing the likelihood of breast cancer recurrence. Recommended 30 mins daily 6 days a week of either brisk walking or cycling or swimming. Encouraged patient to eat more fruits and vegetables and decrease red meat.   Return to clinic in one year for followup   Orders Placed This Encounter  Procedures  . CBC with Differential    Standing Status: Future     Number of Occurrences:      Standing Expiration Date: 01/29/2015  . Comprehensive metabolic panel (Cmet) - [facility]    Standing Status: Future     Number of Occurrences:      Standing Expiration Date: 01/29/2015   The patient has a good understanding of the overall plan. she agrees with it. She will  call with any problems that may develop before her next visit here.  I spent 15 minutes counseling the patient face to face. The total time spent in the appointment was 15 minutes and more than 50% was on counseling and review of test results    Rulon Eisenmenger, MD 01/29/2014 4:14 PM

## 2014-01-29 NOTE — Telephone Encounter (Signed)
, °

## 2014-01-29 NOTE — Assessment & Plan Note (Signed)
Left breast invasive ductal carcinoma diagnosed in 2004 underwent lumpectomy followed by radiation therapy completed 08/20/2002 then started on Arimidex and receive day for 10 years stopping in 2014. Initial tumor was 1.1 cm in size grade 1 ER/PR positive HER-2 negative T1 C. N0 M0 stage IA  Surveillance: Today's breast exam was normal. I reviewed her mammograms which were normal as well. These were done in June 2015. Patient continued to undergo mammograms annually and physical exams all  Survivorship:Discussed the importance of physical exercise in decreasing the likelihood of breast cancer recurrence. Recommended 30 mins daily 6 days a week of either brisk walking or cycling or swimming. Encouraged patient to eat more fruits and vegetables and decrease red meat.   Return to clinic in one year for followup

## 2014-02-05 LAB — HM DIABETES EYE EXAM

## 2014-02-19 ENCOUNTER — Encounter: Payer: Self-pay | Admitting: Internal Medicine

## 2014-05-09 ENCOUNTER — Encounter: Payer: Self-pay | Admitting: Internal Medicine

## 2014-05-09 ENCOUNTER — Ambulatory Visit (INDEPENDENT_AMBULATORY_CARE_PROVIDER_SITE_OTHER): Payer: Medicare Other | Admitting: Internal Medicine

## 2014-05-09 VITALS — BP 140/78 | HR 91 | Temp 97.5°F | Ht 62.0 in | Wt 161.0 lb

## 2014-05-09 DIAGNOSIS — E785 Hyperlipidemia, unspecified: Secondary | ICD-10-CM

## 2014-05-09 DIAGNOSIS — Z860100 Personal history of colon polyps, unspecified: Secondary | ICD-10-CM

## 2014-05-09 DIAGNOSIS — R7309 Other abnormal glucose: Secondary | ICD-10-CM

## 2014-05-09 DIAGNOSIS — I10 Essential (primary) hypertension: Secondary | ICD-10-CM | POA: Diagnosis not present

## 2014-05-09 DIAGNOSIS — R7303 Prediabetes: Secondary | ICD-10-CM

## 2014-05-09 MED ORDER — TRIAMTERENE-HCTZ 75-50 MG PO TABS: 0.5000 | ORAL_TABLET | Freq: Every day | ORAL | Status: DC

## 2014-05-09 MED ORDER — PRAVASTATIN SODIUM 40 MG PO TABS: 40.0000 mg | ORAL_TABLET | Freq: Every day | ORAL | Status: DC

## 2014-05-09 MED ORDER — LOSARTAN POTASSIUM 50 MG PO TABS: 50.0000 mg | ORAL_TABLET | Freq: Every day | ORAL | Status: DC

## 2014-05-09 NOTE — Assessment & Plan Note (Addendum)
NMR Lipoprofile, TSH

## 2014-05-09 NOTE — Assessment & Plan Note (Signed)
BMET WNL 01/29/14 A1c , urine microalbumin

## 2014-05-09 NOTE — Assessment & Plan Note (Signed)
BMET WNL 01/29/14 BP goals reviewed

## 2014-05-09 NOTE — Assessment & Plan Note (Signed)
CBC & dif WNL 01/29/14

## 2014-05-09 NOTE — Patient Instructions (Signed)
  Your next office appointment will be determined based upon review of your pending labs . Those instructions will be transmitted to you through My Chart  Critical values will be called. Followup as needed for any active or acute issue. Please report any significant change in your symptoms.Minimal Blood Pressure Goal= AVERAGE < 140/90;  Ideal is an AVERAGE < 135/85. This AVERAGE should be calculated from @ least 5-7 BP readings taken @ different times of day on different days of week. You should not respond to isolated BP readings , but rather the AVERAGE for that week .Please bring your  blood pressure cuff to office visits to verify that it is reliable.It  can also be checked against the blood pressure device at the pharmacy. Finger or wrist cuffs are not dependable; an arm cuff is.

## 2014-05-09 NOTE — Progress Notes (Signed)
Pre visit review using our clinic review tool, if applicable. No additional management support is needed unless otherwise documented below in the visit note. 

## 2014-05-09 NOTE — Progress Notes (Signed)
Subjective:    Patient ID: Tiffany Hood, female    DOB: 12-22-1940, 74 y.o.   MRN: [id number]  HPI The patient is here to assess status of active health conditions.  PMH, FH, & Social History reviewed & updated.  Compliant with meds w/o adverse side effects. Blood pressure at physician visits typically in the 130s over 77s. She does not have a home blood pressure cuff.  She has had a 15 pound weight gain over the past year. This is in spite of having colon resection in January 4196, complicated by wound infection which required treatment until May.  She is on a heart healthy diet with limited salt intake.  She has been monitoring glucoses; these range 90s to 105. 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  Review of Systems  Significant headaches, epistaxis, chest pain, palpitations, exertional dyspnea, claudication, paroxysmal nocturnal dyspnea, or edema absent. No GI symptoms , memory loss or myalgias    Polyuria, polyphagia, polydipsia absent.  There is no blurred vision, double vision, or loss of vision.   No postural dizziness noted. Denied are numbness, tingling, or burning of the extremities.  No nonhealing skin lesions present.  Weight is stable.     Objective:   Physical Exam  Gen.: Adequately nourished in appearance. Alert, appropriate and cooperative throughout exam.  Appears younger than stated age  Head: Normocephalic without obvious abnormalities  Eyes: No corneal or conjunctival inflammation noted. Pupils equal round reactive to light and accommodation. Extraocular motion intact.  Ears: External  ear exam reveals no significant lesions or deformities. Wax on R. Hearing is grossly normal bilaterally. Nose: External nasal exam reveals no deformity or inflammation. Nasal mucosa are pink and moist. No lesions or exudates noted.   Mouth: Oral mucosa and oropharynx reveal no lesions or exudates. Teeth in good repair. Neck: No  deformities, masses, or tenderness noted. Range of motion &. Thyroid normal. Lungs: Normal respiratory effort; chest expands symmetrically. Lungs are clear to auscultation without rales, wheezes, or increased work of breathing. Heart: Normal rate and rhythm. Normal S1 and S2. No gallop, click, or rub. S4 w/o murmur. Abdomen: Bowel sounds normal; abdomen soft and nontender. No masses, organomegaly or hernias noted. Genitalia:  as per Gyn                                  Musculoskeletal/extremities: No deformity or scoliosis noted of  the thoracic or lumbar spine.  No clubbing, cyanosis, edema, or significant extremity  deformity noted.  Range of motion normal . Tone & strength normal. 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  Fingernail  health good. Crepitus of knees ; L > R Able to lie down & sit up w/o help.  Negative SLR bilaterally Vascular: Carotid, radial artery, dorsalis pedis and  posterior tibial pulses are full and equal. No bruits present. Neurologic: Alert and oriented x3. Deep tendon reflexes symmetrical and normal.  Gait normal   Skin: Intact without suspicious lesions or rashes. Lymph: No cervical, axillary lymphadenopathy present. Psych: Mood and affect are normal. Normally interactive  Assessment & Plan:  See Current Assessment & Plan in Problem List under specific Diagnosis

## 2014-05-10 ENCOUNTER — Other Ambulatory Visit (INDEPENDENT_AMBULATORY_CARE_PROVIDER_SITE_OTHER): Payer: Medicare Other

## 2014-05-10 ENCOUNTER — Other Ambulatory Visit: Payer: Self-pay | Admitting: Internal Medicine

## 2014-05-10 ENCOUNTER — Telehealth: Payer: Self-pay | Admitting: Internal Medicine

## 2014-05-10 DIAGNOSIS — R7309 Other abnormal glucose: Secondary | ICD-10-CM

## 2014-05-10 DIAGNOSIS — E785 Hyperlipidemia, unspecified: Secondary | ICD-10-CM | POA: Diagnosis not present

## 2014-05-10 DIAGNOSIS — R7303 Prediabetes: Secondary | ICD-10-CM

## 2014-05-10 LAB — HEMOGLOBIN A1C: Hgb A1c MFr Bld: 6.6 % — ABNORMAL HIGH (ref 4.6–6.5)

## 2014-05-10 LAB — MICROALBUMIN / CREATININE URINE RATIO
CREATININE, U: 216.8 mg/dL
MICROALB UR: 1.3 mg/dL (ref 0.0–1.9)
MICROALB/CREAT RATIO: 0.6 mg/g (ref 0.0–30.0)

## 2014-05-10 LAB — TSH: TSH: 1.35 u[IU]/mL (ref 0.35–4.50)

## 2014-05-10 NOTE — Telephone Encounter (Signed)
emmi emailed °

## 2014-05-13 ENCOUNTER — Other Ambulatory Visit: Payer: Self-pay | Admitting: Internal Medicine

## 2014-05-13 ENCOUNTER — Other Ambulatory Visit: Payer: Self-pay

## 2014-05-13 DIAGNOSIS — R7303 Prediabetes: Secondary | ICD-10-CM

## 2014-05-13 DIAGNOSIS — E785 Hyperlipidemia, unspecified: Secondary | ICD-10-CM

## 2014-05-13 MED ORDER — PRAVASTATIN SODIUM 40 MG PO TABS: 40.0000 mg | ORAL_TABLET | Freq: Every day | ORAL | Status: DC

## 2014-05-16 LAB — NMR LIPOPROFILE WITH LIPIDS
Cholesterol, Total: 193 mg/dL (ref 100–199)
HDL PARTICLE NUMBER: 43.4 umol/L (ref >=30.5)
HDL Size: 8.9 nm — ABNORMAL LOW (ref >=9.2)
HDL-C: 63 mg/dL (ref >39)
LDL (calc): 80 mg/dL (ref 0–99)
LDL Particle Number: 965 nmol/L (ref <1000)
LDL Size: 20.7 nm (ref >=20.8)
LP-IR SCORE: 60 — AB (ref <=45)
Large HDL-P: 7.2 umol/L (ref >=4.8)
Large VLDL-P: 8.2 nmol/L — ABNORMAL HIGH (ref <=2.7)
Small LDL Particle Number: 424 nmol/L (ref <=527)
Triglycerides: 251 mg/dL — ABNORMAL HIGH (ref 0–149)
VLDL Size: 50 nm — ABNORMAL HIGH (ref <=46.6)

## 2014-06-13 ENCOUNTER — Other Ambulatory Visit: Payer: Self-pay

## 2014-06-13 DIAGNOSIS — Z1231 Encounter for screening mammogram for malignant neoplasm of breast: Secondary | ICD-10-CM

## 2014-07-23 ENCOUNTER — Encounter: Payer: Self-pay | Admitting: Internal Medicine

## 2014-07-23 ENCOUNTER — Ambulatory Visit (INDEPENDENT_AMBULATORY_CARE_PROVIDER_SITE_OTHER): Payer: Medicare Other | Admitting: Internal Medicine

## 2014-07-23 VITALS — BP 136/82 | HR 77 | Temp 98.1°F | Resp 16 | Ht 62.0 in | Wt 162.4 lb

## 2014-07-23 DIAGNOSIS — J209 Acute bronchitis, unspecified: Secondary | ICD-10-CM

## 2014-07-23 DIAGNOSIS — J069 Acute upper respiratory infection, unspecified: Secondary | ICD-10-CM | POA: Diagnosis not present

## 2014-07-23 MED ORDER — AMOXICILLIN 500 MG PO CAPS: 500.0000 mg | ORAL_CAPSULE | Freq: Three times a day (TID) | ORAL | Status: DC

## 2014-07-23 MED ORDER — HYDROCODONE-HOMATROPINE 5-1.5 MG/5ML PO SYRP: 5.0000 mL | ORAL_SOLUTION | Freq: Three times a day (TID) | ORAL | Status: DC | PRN

## 2014-07-23 NOTE — Progress Notes (Signed)
Pre visit review using our clinic review tool, if applicable. No additional management support is needed unless otherwise documented below in the visit note. 

## 2014-07-23 NOTE — Progress Notes (Signed)
   Subjective:    Patient ID: KAI RAILSBACK, female    DOB: 1940-12-29, 74 y.o.   MRN: [id number]  HPI She describes a cough present for 3-4 weeks which is productive of brown/yellow sputum. This has been awakening her at night. It is associated with nasal congestion; nasal obstruction; nasal purulence and lesser volume of light yellow secretions ; & loss of smell. She has minor criteria for sinusitis of fatigue and frontal headache. She has itchy watery eyes as well as some itching in the ears. She has associated sneezing and postnasal drainage.  Allegra-D as been of moderate benefit. She's also use Mucinex DM, Allegra, and an over-the-counter NyQuil type product.     Review of Systems She's had chills without fever. She did describe palpitations after using Allegra-D. She describes some pill dysphagia but no issues with foods or liquids.    Objective:   Physical Exam General appearance:Adequately nourished; no acute distress or increased work of breathing is present.    Lymphatic: No  lymphadenopathy about the head, neck, or axilla .  Eyes: No conjunctival inflammation or lid edema is present. There is no scleral icterus.  Ears:  External ear exam shows no significant lesions or deformities.  Otoscopic examination reveals clear canals, tympanic membranes are intact bilaterally without bulging, retraction, inflammation or discharge. The tympanic membranes are slightly dull .  Nose:  External nasal examination shows no deformity or inflammation. Nasal mucosa are mildly-moderately erythematous without lesions or exudates No septal dislocation or deviation.No obstruction to airflow.   Oral exam: Dental hygiene is good; lips and gums are healthy appearing.There is no oropharyngeal erythema or exudate. She repetitively clears her throat.  Neck:  No deformities, thyromegaly, masses, or tenderness noted.   Supple with full range of motion without pain.   Heart:  Normal rate and regular  rhythm. S1 and S2 normal without gallop, murmur, click, rub or other extra sounds.   Lungs:Chest clear to auscultation; no wheezes, rhonchi,rales ,or rubs present.  Extremities:  No cyanosis, edema, or clubbing  noted    Skin: Warm & dry w/o tenting or jaundice. No significant lesions or rash.        Assessment & Plan:  #1 acute bronchitis w/o bronchospasm #2 URI, acute Plan: See orders and recommendations

## 2014-07-23 NOTE — Patient Instructions (Signed)

## 2014-08-07 ENCOUNTER — Ambulatory Visit (INDEPENDENT_AMBULATORY_CARE_PROVIDER_SITE_OTHER): Payer: Medicare Other | Admitting: Internal Medicine

## 2014-08-07 ENCOUNTER — Encounter: Payer: Self-pay | Admitting: Internal Medicine

## 2014-08-07 ENCOUNTER — Other Ambulatory Visit (INDEPENDENT_AMBULATORY_CARE_PROVIDER_SITE_OTHER): Payer: Medicare Other

## 2014-08-07 VITALS — BP 142/82 | HR 81 | Temp 98.2°F | Resp 15 | Ht 62.0 in | Wt 160.0 lb

## 2014-08-07 DIAGNOSIS — M609 Myositis, unspecified: Secondary | ICD-10-CM

## 2014-08-07 DIAGNOSIS — M791 Myalgia: Secondary | ICD-10-CM | POA: Diagnosis not present

## 2014-08-07 DIAGNOSIS — E559 Vitamin D deficiency, unspecified: Secondary | ICD-10-CM

## 2014-08-07 DIAGNOSIS — IMO0001 Reserved for inherently not codable concepts without codable children: Secondary | ICD-10-CM

## 2014-08-07 DIAGNOSIS — G2581 Restless legs syndrome: Secondary | ICD-10-CM | POA: Diagnosis not present

## 2014-08-07 DIAGNOSIS — D638 Anemia in other chronic diseases classified elsewhere: Secondary | ICD-10-CM | POA: Diagnosis not present

## 2014-08-07 LAB — MAGNESIUM: Magnesium: 2 mg/dL (ref 1.5–2.5)

## 2014-08-07 LAB — BASIC METABOLIC PANEL WITH GFR
BUN: 20 mg/dL (ref 6–23)
CO2: 30 meq/L (ref 19–32)
Calcium: 9.8 mg/dL (ref 8.4–10.5)
Chloride: 101 meq/L (ref 96–112)
Creatinine, Ser: 0.6 mg/dL (ref 0.40–1.20)
GFR: 104.32 mL/min
Glucose, Bld: 99 mg/dL (ref 70–99)
Potassium: 3.5 meq/L (ref 3.5–5.1)
Sodium: 137 meq/L (ref 135–145)

## 2014-08-07 LAB — CK: CK TOTAL: 98 U/L (ref 7–177)

## 2014-08-07 NOTE — Progress Notes (Signed)
   Subjective:    Patient ID: Tiffany Hood, female    DOB: Mar 18, 1941, 74 y.o.   MRN: [id number]  HPI She describes pain below the knees bilaterally for the last 4 nights. There was no trigger or injury prior to onset of symptoms. It only occurs when supine and is noted as level VIII-IX and described as sharp and constant. It will improve when she gets out of bed and ambulates. She's been taking ibuprofen 2 pills at night with minimal benefit. Taking a shower also was of some benefit. She questioned whether this might have been related to taking an antibiotic.  She is on a statin.  She has a history of vitamin D deficiency.   Review of Systems Fever, chills, sweats, or unexplained weight loss not present. There is no numbness, tingling, or weakness in extremities.   No loss of control of bladder or bowels. Radicular type pain absent.     Objective:   Physical Exam Pertinent or positive findings include: Mild PIP/DIP changes in the hands.  Crepitus is present in the knees.  Homans sign is negative bilaterally.  General appearance :adequately nourished; in no distress. Eyes: No conjunctival inflammation or scleral icterus is present. Heart:  Normal rate and regular rhythm. S1 and S2 normal without gallop, murmur, click, rub or other extra sounds   Lungs:Chest clear to auscultation; no wheezes, rhonchi,rales ,or rubs present.No increased work of breathing.  Abdomen: bowel sounds normal, soft and non-tender without masses, organomegaly or hernias noted.  No guarding or rebound. No flank tenderness to percussion. Vascular : all pulses equal ; no bruits present. Skin:Warm & dry.  Intact without suspicious lesions or rashes ; no tenting or jaundice  Lymphatic: No lymphadenopathy is noted about the head, neck, axilla Neuro: Strength, tone & DTRs normal.         Assessment & Plan:  #1 restless leg syndrome  #2 statin therapy  Plan: See orders

## 2014-08-07 NOTE — Progress Notes (Signed)
Pre visit review using our clinic review tool, if applicable. No additional management support is needed unless otherwise documented below in the visit note. 

## 2014-08-07 NOTE — Patient Instructions (Signed)
Perform isometric exercise of calves  ( while seated go up on toes to count of 5 & then onto heels for 5 count) before going to bed; magcal may help the muscle symptoms as we discussed.   Your next office appointment will be determined based upon review of your pending labs  and  xrays  Those instructions will be transmitted to you by My Chart Critical results will be called.   Followup as needed for any active or acute issue. Please report any significant change in your symptoms.

## 2014-08-08 LAB — VITAMIN D 25 HYDROXY (VIT D DEFICIENCY, FRACTURES): VITD: 36.82 ng/mL (ref 30.00–100.00)

## 2014-08-08 LAB — FERRITIN: FERRITIN: 17.6 ng/mL (ref 10.0–291.0)

## 2014-09-09 ENCOUNTER — Ambulatory Visit: Payer: Medicare Other

## 2014-09-09 ENCOUNTER — Ambulatory Visit
Admission: RE | Admit: 2014-09-09 | Discharge: 2014-09-09 | Disposition: A | Discharge status: Home / Self Care | Payer: Medicare Other | Source: Ambulatory Visit

## 2014-09-09 DIAGNOSIS — Z1231 Encounter for screening mammogram for malignant neoplasm of breast: Secondary | ICD-10-CM | POA: Diagnosis not present

## 2014-09-21 IMAGING — XA IR FISTULA/SINUS TRACT
1 series · 13 of 24 positions shown · non-contrast
Comparison: none

CLINICAL DATA: 72-year-old with the pelvic abscess. Evaluate for a
bowel fistula.

[Series 1: run · 13 of 31 slices shown]
[im 1/31]
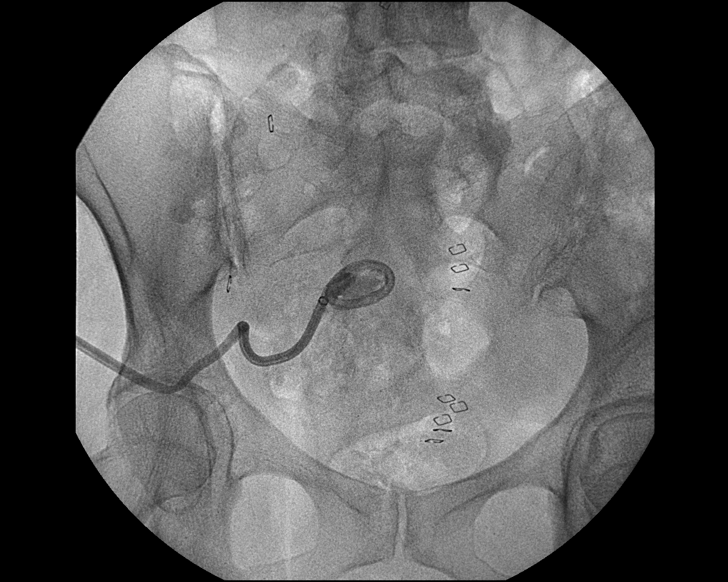
[im 3/31]
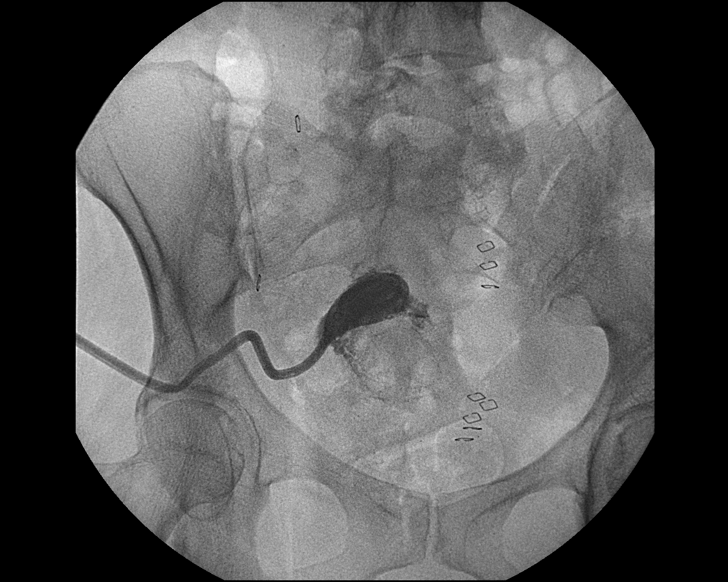
[im 6/31]
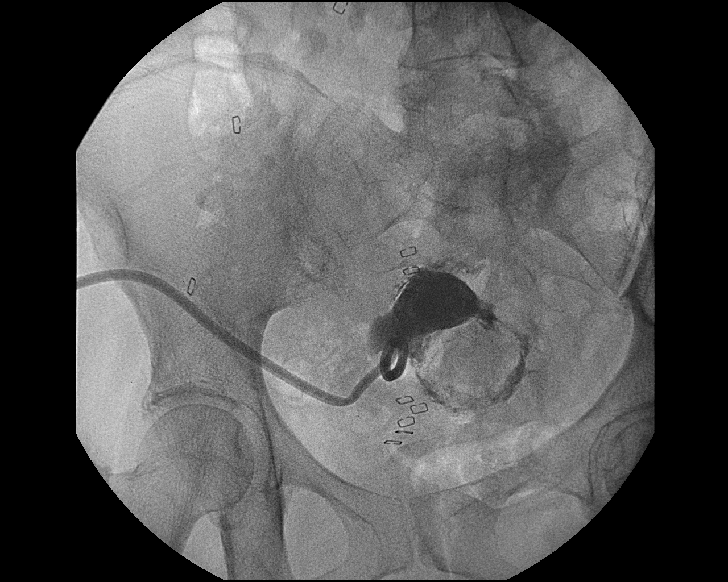
[im 8/31]
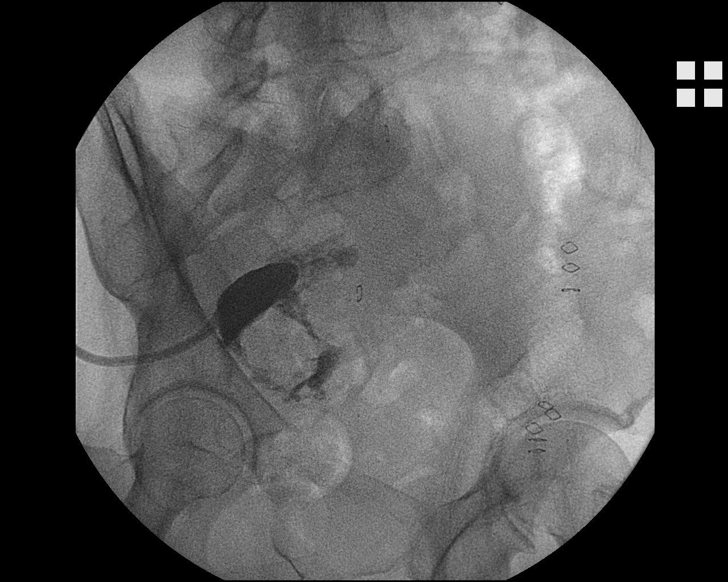
[im 11/31]
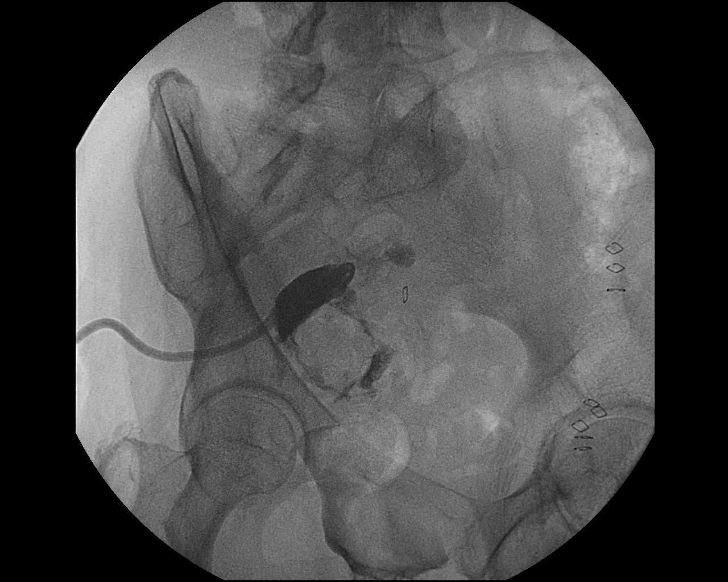
[im 14/31]
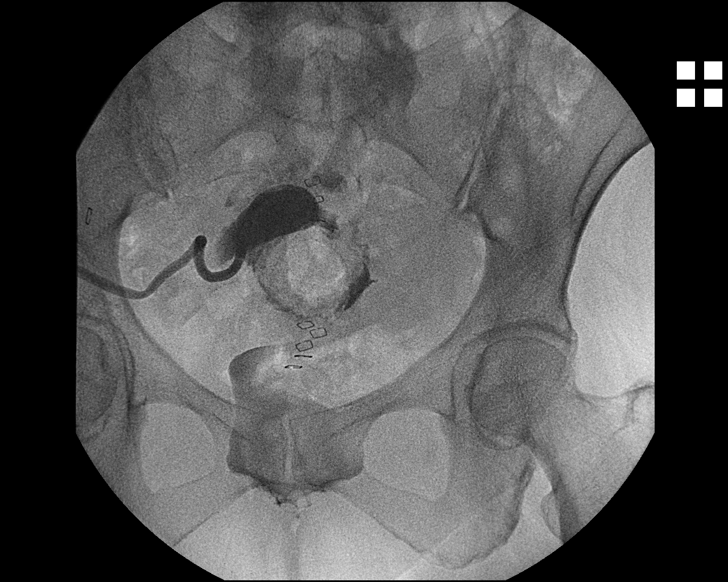
[im 16/31]
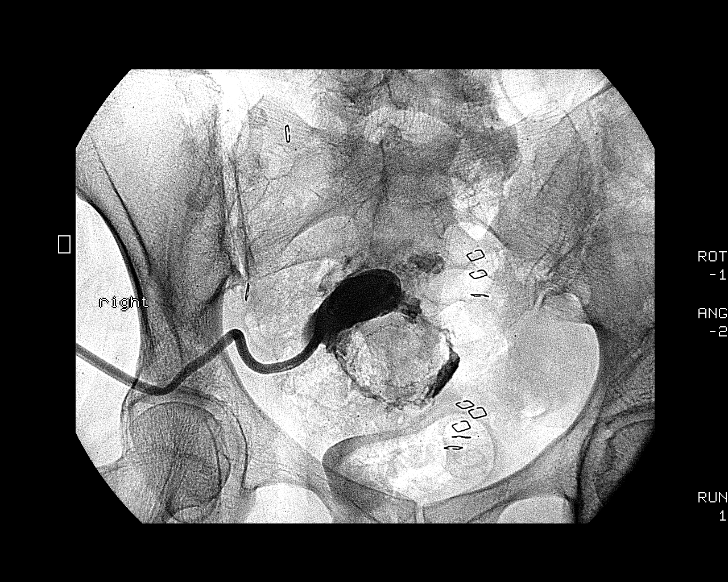
[im 17/31]
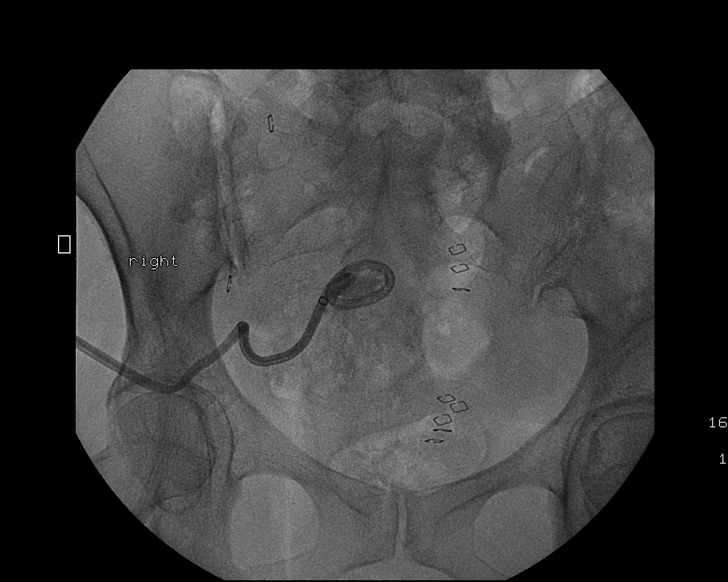
[im 20/31]
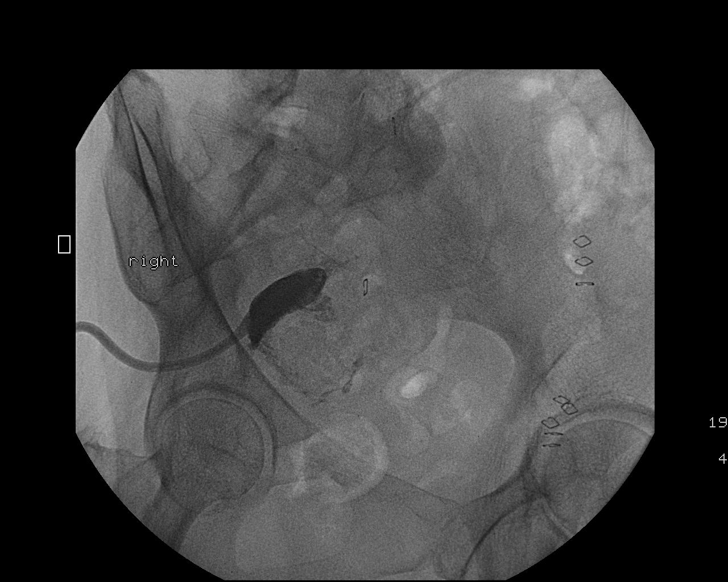
[im 23/31]
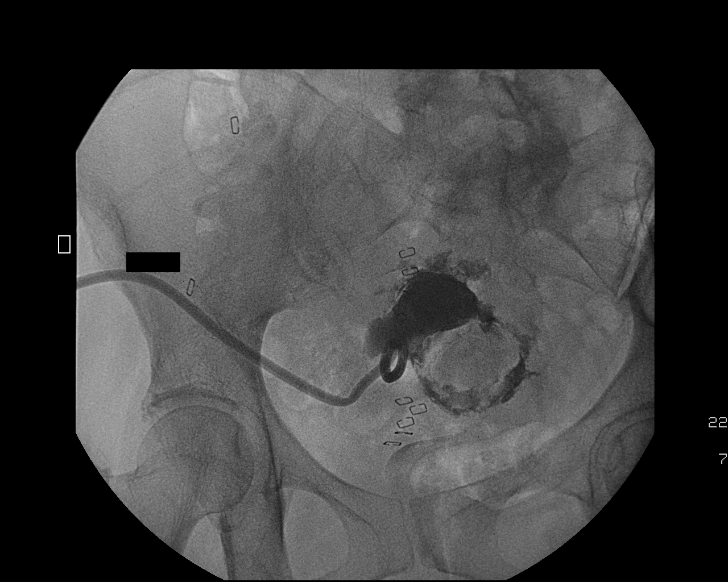
[im 25/31]
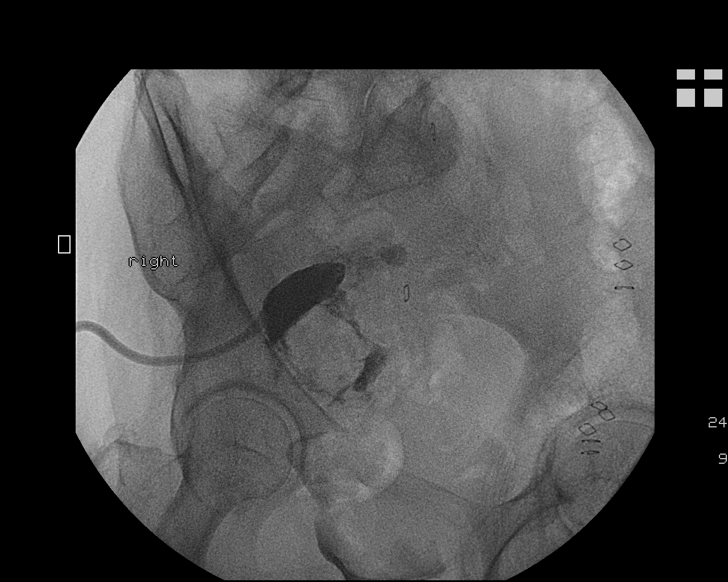
[im 28/31]
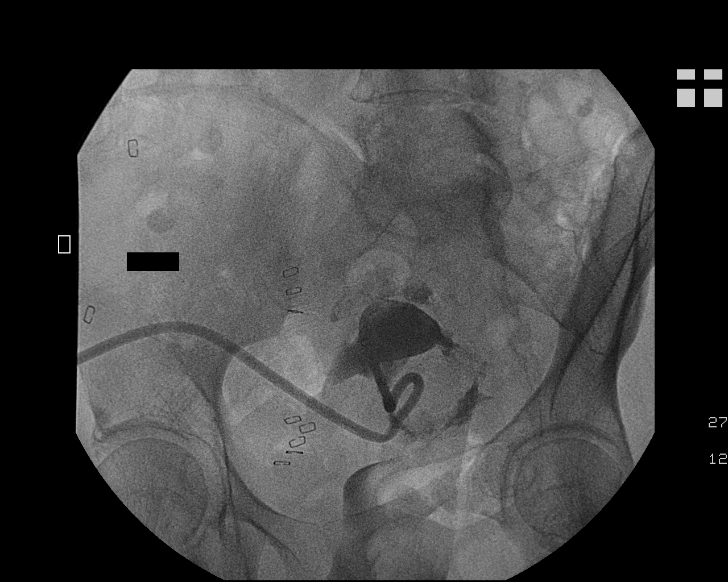
[im 31/31]
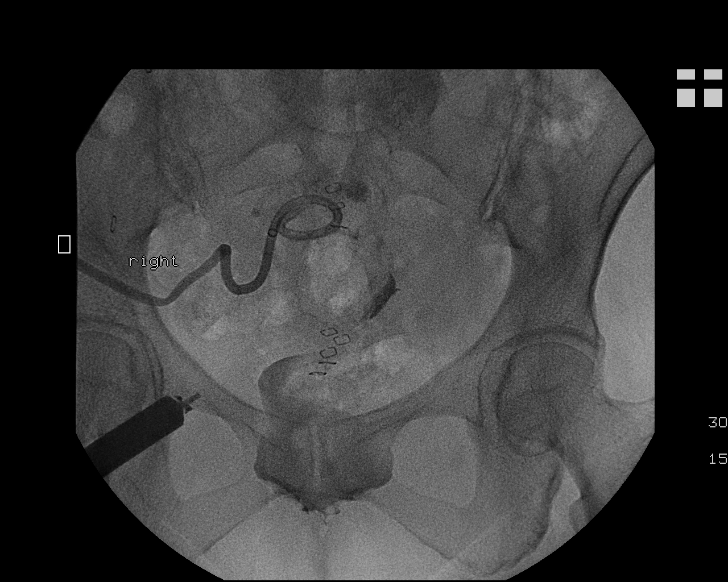

[13 of 24 positions shown; findings below may reference images not displayed]

EXAM:
SINUS TRACT INJECTION/FISTULOGRAM

MEDICATIONS:
None

ANESTHESIA/SEDATION:
Moderate sedation time: None

FLUOROSCOPY TIME:  2 min and 6 seconds

PROCEDURE:
The patient was placed supine on the interventional table. Contrast
was injected through the percutaneous drainage catheter with
fluoroscopy. The contrast was aspirated at the end of the procedure
and the catheter was flushed with normal saline. Catheter was
attached to the gravity bag.

COMPLICATIONS:
None
FINDINGS: Pigtail catheter in the pelvis, just right of midline. The initial
images demonstrate filling of a small collection around the drain.
Additional contrast demonstrates contrast pooling around a bowel
structure. Delayed images demonstrate contrast filling the distal
rectum.
IMPRESSION: Study is positive for a fistulous connection between the abscess
cavity and the rectum.

## 2014-09-23 ENCOUNTER — Other Ambulatory Visit: Payer: Self-pay

## 2014-09-23 ENCOUNTER — Telehealth: Payer: Self-pay | Admitting: Internal Medicine

## 2014-09-23 NOTE — Telephone Encounter (Signed)
Patient is requesting to have a pneumonia shot, please advise

## 2014-09-23 NOTE — Telephone Encounter (Signed)
Spoke with pt and scheduled nurse visit to receive prevnar Thurs 09/26/14 @ 10am

## 2014-09-25 ENCOUNTER — Ambulatory Visit: Payer: Medicare Other | Admitting: Internal Medicine

## 2014-09-26 ENCOUNTER — Ambulatory Visit (INDEPENDENT_AMBULATORY_CARE_PROVIDER_SITE_OTHER): Payer: Medicare Other

## 2014-09-26 DIAGNOSIS — Z23 Encounter for immunization: Secondary | ICD-10-CM

## 2014-11-04 ENCOUNTER — Telehealth: Payer: Self-pay | Admitting: Emergency Medicine

## 2014-11-04 ENCOUNTER — Other Ambulatory Visit: Payer: Self-pay | Admitting: Internal Medicine

## 2014-11-05 ENCOUNTER — Telehealth: Payer: Self-pay | Admitting: Emergency Medicine

## 2014-11-05 DIAGNOSIS — E785 Hyperlipidemia, unspecified: Secondary | ICD-10-CM

## 2014-11-05 MED ORDER — PRAVASTATIN SODIUM 40 MG PO TABS: 40.0000 mg | ORAL_TABLET | Freq: Every day | ORAL | Status: DC

## 2014-11-05 NOTE — Telephone Encounter (Signed)
Refill request for OneTouch Soft Lancets, I do not see these on pts active med list. Please advise

## 2014-11-05 NOTE — Telephone Encounter (Signed)
Refill X 3 mosDx: pre diabetes

## 2014-11-08 ENCOUNTER — Telehealth: Payer: Self-pay | Admitting: *Deleted

## 2014-11-08 MED ORDER — GLUCOSE BLOOD VI STRP: 1.0000 | ORAL_STRIP | Freq: Every day | Status: DC

## 2014-11-08 NOTE — Telephone Encounter (Signed)
Pt states md sent rx for one touch lancets but did not send rx in for strips. Verified pharmacy inform pt will send script to [place]...Johny Chess

## 2014-11-21 ENCOUNTER — Telehealth: Payer: Self-pay | Admitting: Internal Medicine

## 2014-11-21 NOTE — Telephone Encounter (Signed)
Spoke with pt, informed pt that all she needed to have done was the HgB A1C. All other labs were normal that were done in May

## 2014-11-21 NOTE — Telephone Encounter (Signed)
Patient would like to know if she need to come have blood work done, please advise

## 2014-11-22 ENCOUNTER — Other Ambulatory Visit (INDEPENDENT_AMBULATORY_CARE_PROVIDER_SITE_OTHER): Payer: Medicare Other

## 2014-11-22 DIAGNOSIS — R7303 Prediabetes: Secondary | ICD-10-CM

## 2014-11-22 DIAGNOSIS — R7309 Other abnormal glucose: Secondary | ICD-10-CM

## 2014-11-22 LAB — HEMOGLOBIN A1C: HEMOGLOBIN A1C: 6.4 % (ref 4.6–6.5)

## 2015-01-23 DIAGNOSIS — M1812 Unilateral primary osteoarthritis of first carpometacarpal joint, left hand: Secondary | ICD-10-CM | POA: Diagnosis not present

## 2015-01-29 NOTE — Assessment & Plan Note (Signed)
Left breast invasive ductal carcinoma diagnosed in 2004 underwent lumpectomy followed by radiation therapy completed 08/20/2002 then started on Arimidex and receive day for 10 years stopping in 2014. Initial tumor was 1.1 cm in size grade 1 ER/PR positive HER-2 negative T1 C. N0 M0 stage IA  Surveillance: Today's breast exam was normal. I reviewed her mammograms which were normal as well. These were done in June 2016. Patient continued to undergo mammograms annually and physical exams all  Survivorship:Discussed the importance of physical exercise in decreasing the likelihood of breast cancer recurrence. Recommended 30 mins daily 6 days a week of either brisk walking or cycling or swimming. Encouraged patient to eat more fruits and vegetables and decrease red meat.   Return to clinic in one year for followup

## 2015-01-30 ENCOUNTER — Other Ambulatory Visit: Payer: Self-pay | Admitting: *Deleted

## 2015-01-30 ENCOUNTER — Encounter: Payer: Self-pay | Admitting: Hematology and Oncology

## 2015-01-30 ENCOUNTER — Other Ambulatory Visit (HOSPITAL_BASED_OUTPATIENT_CLINIC_OR_DEPARTMENT_OTHER): Payer: Medicare Other

## 2015-01-30 ENCOUNTER — Ambulatory Visit (HOSPITAL_BASED_OUTPATIENT_CLINIC_OR_DEPARTMENT_OTHER): Payer: Medicare Other | Admitting: Hematology and Oncology

## 2015-01-30 ENCOUNTER — Telehealth: Payer: Self-pay | Admitting: Hematology and Oncology

## 2015-01-30 VITALS — BP 130/77 | HR 79 | Temp 98.0°F | Resp 18 | Ht 62.0 in | Wt 160.1 lb

## 2015-01-30 DIAGNOSIS — Z853 Personal history of malignant neoplasm of breast: Secondary | ICD-10-CM

## 2015-01-30 DIAGNOSIS — C50212 Malignant neoplasm of upper-inner quadrant of left female breast: Secondary | ICD-10-CM

## 2015-01-30 LAB — CBC WITH DIFFERENTIAL/PLATELET
BASO%: 0.8 % (ref 0.0–2.0)
Basophils Absolute: 0.1 10*3/uL (ref 0.0–0.1)
EOS%: 3 % (ref 0.0–7.0)
Eosinophils Absolute: 0.3 10*3/uL (ref 0.0–0.5)
HEMATOCRIT: 41.1 % (ref 34.8–46.6)
HEMOGLOBIN: 13.8 g/dL (ref 11.6–15.9)
LYMPH#: 1.7 10*3/uL (ref 0.9–3.3)
LYMPH%: 18.5 % (ref 14.0–49.7)
MCH: 28.8 pg (ref 25.1–34.0)
MCHC: 33.6 g/dL (ref 31.5–36.0)
MCV: 85.6 fL (ref 79.5–101.0)
MONO#: 0.7 10*3/uL (ref 0.1–0.9)
MONO%: 7.3 % (ref 0.0–14.0)
NEUT#: 6.6 10*3/uL — ABNORMAL HIGH (ref 1.5–6.5)
NEUT%: 70.4 % (ref 38.4–76.8)
Platelets: 238 10*3/uL (ref 145–400)
RBC: 4.8 10*6/uL (ref 3.70–5.45)
RDW: 14.7 % — ABNORMAL HIGH (ref 11.2–14.5)
WBC: 9.4 10*3/uL (ref 3.9–10.3)

## 2015-01-30 LAB — COMPREHENSIVE METABOLIC PANEL (CC13)
ALBUMIN: 3.8 g/dL (ref 3.5–5.0)
ALK PHOS: 75 U/L (ref 40–150)
ALT: 15 U/L (ref 0–55)
AST: 15 U/L (ref 5–34)
Anion Gap: 10 mEq/L (ref 3–11)
BUN: 29 mg/dL — AB (ref 7.0–26.0)
CALCIUM: 10.1 mg/dL (ref 8.4–10.4)
CO2: 27 mEq/L (ref 22–29)
Chloride: 103 mEq/L (ref 98–109)
Creatinine: 0.8 mg/dL (ref 0.6–1.1)
EGFR: 78 mL/min/{1.73_m2} — ABNORMAL LOW (ref >90)
Glucose: 113 mg/dl (ref 70–140)
Potassium: 3.7 mEq/L (ref 3.5–5.1)
Sodium: 139 mEq/L (ref 136–145)
Total Bilirubin: 0.45 mg/dL (ref 0.20–1.20)
Total Protein: 7.1 g/dL (ref 6.4–8.3)

## 2015-01-30 NOTE — Addendum Note (Signed)
Addended by: Prentiss Bells on: 01/30/2015 03:16 PM   Modules accepted: Orders, Medications

## 2015-01-30 NOTE — Telephone Encounter (Signed)
Appointments made and avs printed for patient °

## 2015-01-30 NOTE — Progress Notes (Signed)
Patient Care Team: Hendricks Limes, MD as PCP - General (Internal Medicine) Neldon Mc, MD as Surgeon (General Surgery) Eston Esters, MD as Consulting Physician (Hematology and Oncology)  DIAGNOSIS: 74 year old female with history of breast cancer diagnosed in 2004  PRIOR THERAPY: #1 T1c N0 breast cancer status post lumpectomy 05/07/2002. She went on to complete radiation therapy adjuvantly on 08/19/2012. #2 patient took Arimidex 1 mg daily from June 2004 to June 2014.  CURRENT THERAPY: observation CHIEF COMPLIANT: annual followup of breast cancer  INTERVAL HISTORY: Tiffany Hood is a 74 year old with above-mentioned history of left breast cancer treated with lumpectomy radiation in 10 years of Arimidex that was stopped in 2014. She had a stage IA breast cancer. She is doing very well from the breast standpoint. Denies any lumps or nodules in the breasts. Recently she had osteoarthritis and required an injection of the left wrist area. Since then she has had some reactions with some redness of the chest as well as some muscle cramping. It is slowly getting better.  REVIEW OF SYSTEMS:   Constitutional: Denies fevers, chills or abnormal weight loss Eyes: Denies blurriness of vision Ears, nose, mouth, throat, and face: Denies mucositis or sore throat Respiratory: Denies cough, dyspnea or wheezes Cardiovascular: Denies palpitation, chest discomfort or lower extremity swelling Gastrointestinal:  Denies nausea, heartburn or change in bowel habits Skin: Denies abnormal skin rashes Lymphatics: Denies new lymphadenopathy or easy bruising Neurological:Denies numbness, tingling or new weaknesses Behavioral/Psych: Mood is stable, no new changes  Breast:  denies any pain or lumps or nodules in either breasts All other systems were reviewed with the patient and are negative.  I have reviewed the past medical history, past surgical history, social history and family history with the  patient and they are unchanged from previous note.  ALLERGIES:  has No Known Allergies.  MEDICATIONS:  Current Outpatient Prescriptions  Medication Sig Dispense Refill  . aspirin 81 MG tablet Take 81 mg by mouth every Monday, Wednesday, and Friday.     . Calcium Carbonate-Vitamin D (CALTRATE 600+D PO) Take 1 capsule by mouth daily.     Marland Kitchen glucose blood (ONE TOUCH ULTRA TEST) test strip 1 each by Other route daily. Use to check blood sugars daily Dx E11.9 100 each 3  . HYDROcodone-homatropine (HYCODAN) 5-1.5 MG/5ML syrup Take 5 mLs by mouth every 8 (eight) hours as needed for cough. (Patient not taking: Reported on 08/07/2014) 120 mL 0  . Lancets (ONETOUCH ULTRASOFT) lancets USE ONCE DAILY AS DIRECTED 100 each 0  . losartan (COZAAR) 50 MG tablet Take 1 tablet (50 mg total) by mouth daily. 90 tablet 3  . Multiple Vitamin (MULTIVITAMIN) tablet Take 1 tablet by mouth daily.      . Omega-3 Fatty Acids (FISH OIL) 1000 MG CAPS Take 1,000 mg by mouth daily.     Vladimir Faster Glycol-Propyl Glycol (SYSTANE) 0.4-0.3 % SOLN Place 1 drop into both eyes every morning.    . pravastatin (PRAVACHOL) 40 MG tablet Take 1 tablet (40 mg total) by mouth daily. 90 tablet 1  . triamterene-hydrochlorothiazide (MAXZIDE) 75-50 MG per tablet Take 0.5 tablets by mouth daily. 45 tablet 3  . vitamin E 400 UNIT capsule Take 400 Units by mouth daily.       No current facility-administered medications for this visit.    PHYSICAL EXAMINATION: ECOG PERFORMANCE STATUS: 1 - Symptomatic but completely ambulatory  There were no vitals filed for this visit. There were no vitals filed for this visit.  GENERAL:alert, no distress and comfortable SKIN: skin color, texture, turgor are normal, no rashes or significant lesions EYES: normal, Conjunctiva are pink and non-injected, sclera clear OROPHARYNX:no exudate, no erythema and lips, buccal mucosa, and tongue normal  NECK: supple, thyroid normal size, non-tender, without  nodularity LYMPH:  no palpable lymphadenopathy in the cervical, axillary or inguinal LUNGS: clear to auscultation and percussion with normal breathing effort HEART: regular rate & rhythm and no murmurs and no lower extremity edema ABDOMEN:abdomen soft, non-tender and normal bowel sounds Musculoskeletal: Muscle cramping especially in the right buttock area as well as the right shin  NEURO: alert & oriented x 3 with fluent speech, no focal motor/sensory deficits BREAST: No palpable masses or nodules in either right or left breasts. No palpable axillary supraclavicular or infraclavicular adenopathy no breast tenderness or nipple discharge. (exam performed in the presence of a chaperone)  LABORATORY DATA:  I have reviewed the data as listed   Chemistry      Component Value Date/Time   NA 137 08/07/2014 1635   NA 138 01/29/2014 1518   K 3.5 08/07/2014 1635   K 3.9 01/29/2014 1518   CL 101 08/07/2014 1635   CO2 30 08/07/2014 1635   CO2 26 01/29/2014 1518   BUN 20 08/07/2014 1635   BUN 19.4 01/29/2014 1518   CREATININE 0.60 08/07/2014 1635   CREATININE 0.8 01/29/2014 1518      Component Value Date/Time   CALCIUM 9.8 08/07/2014 1635   CALCIUM 10.4 01/29/2014 1518   ALKPHOS 82 01/29/2014 1518   ALKPHOS 61 01/19/2012 1220   AST 17 01/29/2014 1518   AST 14 01/19/2012 1220   ALT 16 01/29/2014 1518   ALT 19 01/19/2012 1220   BILITOT 0.20 01/29/2014 1518   BILITOT 0.4 01/19/2012 1220       Lab Results  Component Value Date   WBC 8.7 01/29/2014   HGB 13.4 01/29/2014   HCT 40.8 01/29/2014   MCV 86.0 01/29/2014   PLT 234 01/29/2014   NEUTROABS 5.5 01/29/2014    ASSESSMENT & PLAN:  Hx breast cancer, IDC Left breast invasive ductal carcinoma diagnosed in 2004 underwent lumpectomy followed by radiation therapy completed 08/20/2002 then started on Arimidex and receive day for 10 years stopping in 2014. Initial tumor was 1.1 cm in size grade 1 ER/PR positive HER-2 negative T1 C. N0 M0  stage IA  Surveillance: Today's breast exam was normal. I reviewed her mammograms which were normal as well. These were done in June 2016. Patient continued to undergo mammograms annually and physical exams.  Survivorship:Discussed the importance of physical exercise in decreasing the likelihood of breast cancer recurrence. Recommended 30 mins daily 6 days a week of either brisk walking or cycling or swimming. Encouraged patient to eat more fruits and vegetables and decrease red meat.   Return to clinic in one year for followup with survivorship.   No orders of the defined types were placed in this encounter.   The patient has a good understanding of the overall plan. she agrees with it. she will call with any problems that may develop before the next visit here.   Rulon Eisenmenger, MD 01/30/2015

## 2015-01-30 NOTE — Telephone Encounter (Signed)
Appointments made and avs printed for pateint °

## 2015-01-31 ENCOUNTER — Ambulatory Visit (HOSPITAL_COMMUNITY)
Admission: RE | Admit: 2015-01-31 | Discharge: 2015-01-31 | Disposition: A | Discharge status: Home / Self Care | Payer: Medicare Other | Source: Ambulatory Visit | Attending: Cardiovascular Disease | Admitting: Cardiovascular Disease

## 2015-01-31 ENCOUNTER — Ambulatory Visit (INDEPENDENT_AMBULATORY_CARE_PROVIDER_SITE_OTHER)
Admission: RE | Admit: 2015-01-31 | Discharge: 2015-01-31 | Disposition: A | Discharge status: Home / Self Care | Payer: Medicare Other | Source: Ambulatory Visit | Attending: Internal Medicine | Admitting: Internal Medicine

## 2015-01-31 ENCOUNTER — Encounter: Payer: Self-pay | Admitting: Internal Medicine

## 2015-01-31 ENCOUNTER — Other Ambulatory Visit: Payer: Self-pay | Admitting: Internal Medicine

## 2015-01-31 ENCOUNTER — Ambulatory Visit (INDEPENDENT_AMBULATORY_CARE_PROVIDER_SITE_OTHER): Payer: Medicare Other | Admitting: Internal Medicine

## 2015-01-31 VITALS — BP 138/82 | HR 64 | Temp 97.7°F | Resp 18 | Wt 162.0 lb

## 2015-01-31 DIAGNOSIS — M79661 Pain in right lower leg: Secondary | ICD-10-CM | POA: Diagnosis not present

## 2015-01-31 DIAGNOSIS — I10 Essential (primary) hypertension: Secondary | ICD-10-CM | POA: Diagnosis not present

## 2015-01-31 DIAGNOSIS — M79671 Pain in right foot: Secondary | ICD-10-CM

## 2015-01-31 DIAGNOSIS — E785 Hyperlipidemia, unspecified: Secondary | ICD-10-CM | POA: Diagnosis not present

## 2015-01-31 DIAGNOSIS — M7731 Calcaneal spur, right foot: Secondary | ICD-10-CM | POA: Diagnosis not present

## 2015-01-31 DIAGNOSIS — M25571 Pain in right ankle and joints of right foot: Secondary | ICD-10-CM

## 2015-01-31 DIAGNOSIS — E119 Type 2 diabetes mellitus without complications: Secondary | ICD-10-CM | POA: Diagnosis not present

## 2015-01-31 NOTE — Progress Notes (Signed)
Pre visit review using our clinic review tool, if applicable. No additional management support is needed unless otherwise documented below in the visit note. 

## 2015-01-31 NOTE — Progress Notes (Signed)
Subjective:    Patient ID: Tiffany Hood, female    DOB: 27-Nov-1940, 74 y.o.   MRN: [id number]  HPI She is here for an acute visit for right foot/ankle and calf pain.  She has pain in her right foot and ankle as well as some mild swelling in the ankle/foot. She also has right calf pain that is mild.  These symptoms started about one week ago after she had an injection for her left thumb arthritis.  the injection had marcaine, xylocaine, celestone (betamethasone) in it.  She is not sure if her symptoms are related to that injection, but she also had other symptoms from the injection (erythema in chest and face, nausea and hip pain).    She has pain in the right lower leg when she walks or puts pressure on the foot.  She denies pain at rest. The pain is mild, but she was concerned about a stress fracture of blood clot.  She has elevated it and put heat on it.  She did feel some numbness/tingling when the heat was on it, but she denies any now.  Her pain is worse today.  She typically walks 2 miles 6 days a week and has been doing that for over three months.     Medications and allergies reviewed with patient and updated if appropriate.  Patient Active Problem List   Diagnosis Date Noted  . Anemia of other chronic disease 05/20/2013  . Osteoporosis, unspecified 03/30/2013  . Hypercalcemia 03/30/2013  . History of colonic polyps 11/15/2012  . Diverticulitis of sigmoid colon 10/05/2012  . Diverticulosis 10/19/2011  . Osteoarthritis 01/28/2010  . Hyperlipidemia 12/25/2007  . Pre-diabetes 02/13/2007  . Essential hypertension 02/10/2007  . Breast cancer of upper-inner quadrant of left female breast ([facility]) 05/02/2002    Past Medical History  Diagnosis Date  . Hypertension   . Hyperlipidemia   . Osteoporosis     Fosamax Rxed 2/13  . Breast cancer ([place]) left  . Diverticulitis     09/14/12  . Diverticulosis   . Personal history of colonic adenomas 11/15/2012  . Heart murmur   .  Diabetes mellitus     TYPE 2    Past Surgical History  Procedure Laterality Date  . Abdominal hysterectomy  1965    ovaries remain; for dysfunctional menses; ? endometriosis  . Tonsillectomy and adenoidectomy    . Colonoscopy  2004     Tics  . Removal bone spur Left 2/14    2nd finger  . Colon polyps  2014    Dr Carlean Purl  . Breast lumpectomy Left   . Laparoscopic sigmoid colectomy  04/13/2013    DR TOTH  . Abdominal surgery    . Laparoscopic sigmoid colectomy N/A 04/13/2013    Procedure: LAPAROSCOPIC ASSISTED SIGMOID COLECTOMY;  Surgeon: Merrie Roof, MD;  Location: [place];  Service: General;  Laterality: N/A;    Social History   Social History  . Marital Status: Widowed    Spouse Name: N/A  . Number of Children: N/A  . Years of Education: N/A   Social History Main Topics  . Smoking status: Never Smoker   . Smokeless tobacco: Never Used  . Alcohol Use: No  . Drug Use: No  . Sexual Activity: Not Asked   Other Topics Concern  . None   Social History Narrative    Review of Systems  Constitutional: Negative for fever and chills.  Respiratory: Negative for shortness of breath.  Cardiovascular: Negative for chest pain.  Musculoskeletal:       Right foot pain, right ankle pain, right calf pain, mild swelling in foot and ankle  Neurological: Positive for numbness (mild numbness/tingling in right foot with heat on it). Negative for weakness.       Objective:   Filed Vitals:   01/31/15 1414  BP: 138/82  Pulse: 64  Temp: 97.7 F (36.5 C)  Resp: 18   Filed Weights   01/31/15 1414  Weight: 162 lb (73.483 kg)   Body mass index is 29.62 kg/(m^2).   Physical Exam  Constitutional: She appears well-developed and well-nourished.  Musculoskeletal:  Right posterior calf with mild tenderness with palpation, no obvious swelling in right calf, mild right foot/ankle swelling, minimal pain with palpation posterior aspect of lateral malleolus, no pain with palpation  throughout right foot, normal DP pulse right foot, normal sensation right foot, normal range of movement right ankle and toes, foot warm to touch   Skin: No rash noted. No erythema.          Assessment & Plan:   Right foot pain and ankle pain, mild right calf pain, mild foot/ankle swelling Xray of foot and ankle to rule out stress fracture [place] of right leg to rule out dvt If both negative will treat conservatively - she will decrease her physical actvity for a short time and wear good foot wear May need ortho referral

## 2015-01-31 NOTE — Patient Instructions (Signed)
   Test(s) ordered today. Your results will be released to MyChart (or called to you) after review, usually within 72hours after test completion. If any changes need to be made, you will be notified at that same time.    Medications reviewed and updated.  No changes recommended at this time.     

## 2015-02-03 ENCOUNTER — Telehealth: Payer: Self-pay | Admitting: Internal Medicine

## 2015-02-03 DIAGNOSIS — M79671 Pain in right foot: Secondary | ICD-10-CM

## 2015-02-03 DIAGNOSIS — M79604 Pain in right leg: Secondary | ICD-10-CM

## 2015-02-03 MED ORDER — TRAMADOL HCL 50 MG PO TABS
50.0000 mg | ORAL_TABLET | Freq: Three times a day (TID) | ORAL | Status: DC | PRN
Start: 2015-02-03 — End: 2015-02-21

## 2015-02-03 NOTE — Telephone Encounter (Signed)
Can try tramadol for pain - rx printed

## 2015-02-03 NOTE — Telephone Encounter (Signed)
Urgent referral ordered for ortho.  What is she taking over-the-counter?  We can try a prescription medication, but it may be a narcotic which I would only want her to take short term until she sees ortho.

## 2015-02-03 NOTE — Telephone Encounter (Signed)
Pt was taking Advil OTC, did not say the dosage.

## 2015-02-03 NOTE — Telephone Encounter (Signed)
Spoke with pt. She would like an urgent referral to ortho. She is okay with going to whoever can get her in the quickest. She is taking OTC pain medication and it only gives very little relief. Is there something she can do in the mean time until she gets in with ortho?

## 2015-02-03 NOTE — Telephone Encounter (Signed)
Pt called in and would like to know if results from test are back and what the next step is.  She is in pain    Best number [phone]

## 2015-02-03 NOTE — Telephone Encounter (Signed)
Faxed over RX, informed pt.

## 2015-02-04 DIAGNOSIS — M5441 Lumbago with sciatica, right side: Secondary | ICD-10-CM | POA: Diagnosis not present

## 2015-02-13 DIAGNOSIS — M5441 Lumbago with sciatica, right side: Secondary | ICD-10-CM | POA: Diagnosis not present

## 2015-02-13 DIAGNOSIS — M25571 Pain in right ankle and joints of right foot: Secondary | ICD-10-CM | POA: Diagnosis not present

## 2015-02-15 DIAGNOSIS — M545 Low back pain: Secondary | ICD-10-CM | POA: Diagnosis not present

## 2015-02-17 ENCOUNTER — Other Ambulatory Visit: Payer: Self-pay | Admitting: Surgical

## 2015-02-17 DIAGNOSIS — M5126 Other intervertebral disc displacement, lumbar region: Secondary | ICD-10-CM | POA: Diagnosis not present

## 2015-02-17 DIAGNOSIS — M5441 Lumbago with sciatica, right side: Secondary | ICD-10-CM | POA: Diagnosis not present

## 2015-02-18 ENCOUNTER — Encounter (HOSPITAL_COMMUNITY)
Admission: RE | Admit: 2015-02-18 | Discharge: 2015-02-18 | Disposition: A | Discharge status: Home / Self Care | Payer: Medicare Other | Source: Ambulatory Visit | Attending: Orthopedic Surgery | Admitting: Orthopedic Surgery

## 2015-02-18 ENCOUNTER — Ambulatory Visit (HOSPITAL_COMMUNITY)
Admission: RE | Admit: 2015-02-18 | Discharge: 2015-02-18 | Disposition: A | Discharge status: Home / Self Care | Payer: Medicare Other | Source: Ambulatory Visit | Attending: Surgical | Admitting: Surgical

## 2015-02-18 ENCOUNTER — Encounter (HOSPITAL_COMMUNITY): Payer: Self-pay

## 2015-02-18 DIAGNOSIS — M4806 Spinal stenosis, lumbar region: Secondary | ICD-10-CM | POA: Diagnosis not present

## 2015-02-18 DIAGNOSIS — M810 Age-related osteoporosis without current pathological fracture: Secondary | ICD-10-CM | POA: Diagnosis not present

## 2015-02-18 DIAGNOSIS — E785 Hyperlipidemia, unspecified: Secondary | ICD-10-CM | POA: Diagnosis not present

## 2015-02-18 DIAGNOSIS — M47816 Spondylosis without myelopathy or radiculopathy, lumbar region: Secondary | ICD-10-CM | POA: Diagnosis not present

## 2015-02-18 DIAGNOSIS — Z8249 Family history of ischemic heart disease and other diseases of the circulatory system: Secondary | ICD-10-CM | POA: Diagnosis not present

## 2015-02-18 DIAGNOSIS — Z79891 Long term (current) use of opiate analgesic: Secondary | ICD-10-CM | POA: Diagnosis not present

## 2015-02-18 DIAGNOSIS — Z79899 Other long term (current) drug therapy: Secondary | ICD-10-CM | POA: Diagnosis not present

## 2015-02-18 DIAGNOSIS — Z01818 Encounter for other preprocedural examination: Secondary | ICD-10-CM | POA: Diagnosis not present

## 2015-02-18 DIAGNOSIS — E119 Type 2 diabetes mellitus without complications: Secondary | ICD-10-CM | POA: Diagnosis not present

## 2015-02-18 DIAGNOSIS — Z853 Personal history of malignant neoplasm of breast: Secondary | ICD-10-CM | POA: Diagnosis not present

## 2015-02-18 DIAGNOSIS — Z7982 Long term (current) use of aspirin: Secondary | ICD-10-CM | POA: Diagnosis not present

## 2015-02-18 DIAGNOSIS — I10 Essential (primary) hypertension: Secondary | ICD-10-CM | POA: Diagnosis not present

## 2015-02-18 DIAGNOSIS — Z90710 Acquired absence of both cervix and uterus: Secondary | ICD-10-CM | POA: Diagnosis not present

## 2015-02-18 DIAGNOSIS — M5126 Other intervertebral disc displacement, lumbar region: Secondary | ICD-10-CM | POA: Diagnosis not present

## 2015-02-18 DIAGNOSIS — K5790 Diverticulosis of intestine, part unspecified, without perforation or abscess without bleeding: Secondary | ICD-10-CM | POA: Diagnosis not present

## 2015-02-18 DIAGNOSIS — M21371 Foot drop, right foot: Secondary | ICD-10-CM | POA: Diagnosis not present

## 2015-02-18 DIAGNOSIS — Z86010 Personal history of colonic polyps: Secondary | ICD-10-CM | POA: Diagnosis not present

## 2015-02-18 DIAGNOSIS — Z9049 Acquired absence of other specified parts of digestive tract: Secondary | ICD-10-CM | POA: Diagnosis not present

## 2015-02-18 HISTORY — DX: Paresthesia of skin: R20.2

## 2015-02-18 HISTORY — DX: Personal history of irradiation: Z92.3

## 2015-02-18 HISTORY — DX: Other seasonal allergic rhinitis: J30.2

## 2015-02-18 HISTORY — DX: Personal history of urinary (tract) infections: Z87.440

## 2015-02-18 HISTORY — DX: Pneumonia, unspecified organism: J18.9

## 2015-02-18 LAB — PROTIME-INR
INR: 0.92 (ref 0.00–1.49)
Prothrombin Time: 12.6 seconds (ref 11.6–15.2)

## 2015-02-18 LAB — SURGICAL PCR SCREEN
MRSA, PCR: NEGATIVE
STAPHYLOCOCCUS AUREUS: NEGATIVE

## 2015-02-18 LAB — COMPREHENSIVE METABOLIC PANEL
ALT: 20 U/L (ref 14–54)
AST: 23 U/L (ref 15–41)
Albumin: 3.6 g/dL (ref 3.5–5.0)
Alkaline Phosphatase: 54 U/L (ref 38–126)
Anion gap: 8 (ref 5–15)
BUN: 27 mg/dL — ABNORMAL HIGH (ref 6–20)
CO2: 31 mmol/L (ref 22–32)
Calcium: 9.3 mg/dL (ref 8.9–10.3)
Chloride: 100 mmol/L — ABNORMAL LOW (ref 101–111)
Creatinine, Ser: 0.67 mg/dL (ref 0.44–1.00)
GFR calc Af Amer: >60 mL/min (ref >60)
GFR calc non Af Amer: >60 mL/min (ref >60)
Glucose, Bld: 99 mg/dL (ref 65–99)
Potassium: 4.1 mmol/L (ref 3.5–5.1)
Sodium: 139 mmol/L (ref 135–145)
Total Bilirubin: 0.5 mg/dL (ref 0.3–1.2)
Total Protein: 6.5 g/dL (ref 6.5–8.1)

## 2015-02-18 LAB — CBC WITH DIFFERENTIAL/PLATELET
Basophils Absolute: 0 10*3/uL (ref 0.0–0.1)
Basophils Relative: 0 %
Eosinophils Absolute: 0.4 10*3/uL (ref 0.0–0.7)
Eosinophils Relative: 5 %
HCT: 39.3 % (ref 36.0–46.0)
Hemoglobin: 12.5 g/dL (ref 12.0–15.0)
Lymphocytes Relative: 28 %
Lymphs Abs: 2.1 10*3/uL (ref 0.7–4.0)
MCH: 28.6 pg (ref 26.0–34.0)
MCHC: 31.8 g/dL (ref 30.0–36.0)
MCV: 89.9 fL (ref 78.0–100.0)
Monocytes Absolute: 0.6 10*3/uL (ref 0.1–1.0)
Monocytes Relative: 9 %
Neutro Abs: 4.3 10*3/uL (ref 1.7–7.7)
Neutrophils Relative %: 58 %
Platelets: 209 10*3/uL (ref 150–400)
RBC: 4.37 MIL/uL (ref 3.87–5.11)
RDW: 15 % (ref 11.5–15.5)
WBC: 7.4 10*3/uL (ref 4.0–10.5)

## 2015-02-18 LAB — APTT: aPTT: 25 seconds (ref 24–37)

## 2015-02-18 LAB — ABO/RH: ABO/RH(D): O POS

## 2015-02-18 NOTE — Progress Notes (Signed)
CMP results in epic per PAT visit 02/18/2015 sent to Dr Gladstone Lighter

## 2015-02-18 NOTE — H&P (Signed)
Tiffany Hood is an 74 y.o. female.   Chief Complaint: low back pain HPI: The patient reports lumbosacral area symptoms including pain, numbness, tingling and pain with walking which began 3 weeks ago without any known injury. Symptoms are reported to be located in the right low back and Symptoms include pain, numbness, tingling, weakness and pain in the calf. The pain radiates to the right buttock, right posterior thigh, right lower leg and right lateral lower leg. The patient describes the pain as dull, tingling and throbbing. The symptom onset was sudden. The patient describes the severity of their symptoms as severe. The patient feels as if the symptoms are worsening. Symptoms are exacerbated by standing. Symptoms are relieved by rest and nonsteroidal anti-inflammatory drugs. Current treatment includes nonsteroidal anti-inflammatory drugs and muscle relaxants. Symptoms present at the patient's previous evaluation included back pain. Past treatment has included nonsteroidal anti-inflammatory drugs and muscle relaxants. She reports weakness and tingling into the rt leg from buttocks to rt lat ankle. MRI shows that she has an extremely large disc at L4-L5 migrating to cephalad. She has a low osteophyte complex distally at 5-1.   Past Medical History  Diagnosis Date  . Hypertension   . Hyperlipidemia   . Osteoporosis     Fosamax Rxed 2/13  . Breast cancer ([place]) left  . Diverticulitis     09/14/12  . Diverticulosis   . Personal history of colonic adenomas 11/15/2012  . Heart murmur   . Diabetes mellitus     TYPE 2    Past Surgical History  Procedure Laterality Date  . Abdominal hysterectomy  1965    ovaries remain; for dysfunctional menses; ? endometriosis  . Tonsillectomy and adenoidectomy    . Colonoscopy  2004     Tics  . Removal bone spur Left 2/14    2nd finger  . Colon polyps  2014    Dr Carlean Purl  . Breast lumpectomy Left   . Laparoscopic sigmoid colectomy  04/13/2013    DR  TOTH  . Abdominal surgery    . Laparoscopic sigmoid colectomy N/A 04/13/2013    Procedure: LAPAROSCOPIC ASSISTED SIGMOID COLECTOMY;  Surgeon: Merrie Roof, MD;  Location: [facility];  Service: General;  Laterality: N/A;    Family History  Problem Relation Age of Onset  . Stroke Mother 33  . Hypertension Mother   . Lung cancer Father     smoker  . Hypertension Sister   . Hypertension Brother   . Stroke Brother 65  . Heart disease Brother 110  . Diabetes Neg Hx   . Colon cancer Neg Hx    Social History:  reports that she has never smoked. She has never used smokeless tobacco. She reports that she does not drink alcohol or use illicit drugs.  Allergies: No Known Allergies    Current outpatient prescriptions:  .  aspirin 81 MG tablet, Take 81 mg by mouth every Monday, Wednesday, and Friday. , Disp: , Rfl:  .  Calcium Carbonate-Vitamin D (CALTRATE 600+D PO), Take 1 capsule by mouth daily. , Disp: , Rfl:  .  Calcium-Magnesium-Vitamin D (CALCIUM MAGNESIUM PO), Take 0.5 tablets by mouth daily. , Disp: , Rfl:  .  losartan (COZAAR) 50 MG tablet, Take 1 tablet (50 mg total) by mouth daily., Disp: 90 tablet, Rfl: 3 .  Multiple Vitamin (MULTIVITAMIN) tablet, Take 1 tablet by mouth daily.  , Disp: , Rfl:  .  Omega-3 Fatty Acids (FISH OIL) 1000 MG CAPS, Take 1,000  mg by mouth daily. , Disp: , Rfl:  .  oxyCODONE-acetaminophen (PERCOCET/ROXICET) 5-325 MG tablet, Take 1 tablet by mouth every 4 (four) hours as needed. pain, Disp: , Rfl:  .  Polyethyl Glycol-Propyl Glycol (SYSTANE) 0.4-0.3 % SOLN, Place 1 drop into both eyes every morning., Disp: , Rfl:  .  pravastatin (PRAVACHOL) 40 MG tablet, Take 1 tablet (40 mg total) by mouth daily., Disp: 90 tablet, Rfl: 1 .  traMADol (ULTRAM) 50 MG tablet, Take 1 tablet (50 mg total) by mouth every 8 (eight) hours as needed. (Patient taking differently: Take 50 mg by mouth every 8 (eight) hours as needed (pain). ), Disp: 20 tablet, Rfl: 0 .   triamterene-hydrochlorothiazide (MAXZIDE) 75-50 MG per tablet, Take 0.5 tablets by mouth daily., Disp: 45 tablet, Rfl: 3 .  vitamin E 400 UNIT capsule, Take 400 Units by mouth daily.  , Disp: , Rfl:    Review of Systems  Constitutional: Negative for fever, chills, weight loss, malaise/fatigue and diaphoresis.  HENT: Negative.   Eyes: Negative.   Respiratory: Negative.   Cardiovascular: Negative.   Gastrointestinal: Negative.   Genitourinary: Negative.   Musculoskeletal: Positive for myalgias, back pain and joint pain. Negative for falls and neck pain.  Skin: Negative.   Neurological: Positive for tingling and weakness. Negative for dizziness, tremors, sensory change, speech change, focal weakness, seizures and loss of consciousness.  Endo/Heme/Allergies: Negative.   Psychiatric/Behavioral: Negative.    Vitals  Weight: 155 lb Height: 62.5in Body Surface Area: 1.73 m Body Mass Index: 27.9 kg/m  BP: 127/71 (Sitting, Left Arm, Standard) HR: 80 Bpm  Physical Exam  Constitutional: She is oriented to person, place, and time. She appears well-developed and well-nourished. No distress.  HENT:  Head: Normocephalic and atraumatic.  Right Ear: External ear normal.  Left Ear: External ear normal.  Nose: Nose normal.  Mouth/Throat: Oropharynx is clear and moist.  Eyes: Conjunctivae and EOM are normal.  Neck: Normal range of motion. Neck supple.  Cardiovascular: Normal rate, regular rhythm, normal heart sounds and intact distal pulses.   No murmur heard. Respiratory: Effort normal and breath sounds normal. No respiratory distress. She has no wheezes.  GI: Soft. Bowel sounds are normal. She exhibits no distension. There is no tenderness.  Musculoskeletal:       Right hip: Normal.       Left hip: Normal.       Right knee: Normal.       Left knee: Normal.       Lumbar back: She exhibits pain and spasm.  Neurological: She is alert and oriented to person, place, and time. She has  normal reflexes. No sensory deficit.  Weakness of EHL on right  Skin: No rash noted. She is not diaphoretic. No erythema.  Psychiatric: She has a normal mood and affect. Her behavior is normal.     Assessment/Plan Lumbar disc herniation L4-L5 She needs a central decompressive lumbar laminectomy and microdiscectomy L4-L5. The possible complications of spinal surgery number one could be infection, which is extremely rare. We do use antibiotics prior to the surgery and during surgery and after surgery. Number two is always a slight degree of probability that you could develop a blood clot in your leg after any type of surgery and we try our best to prevent that with aspirin post op when it is safe to begin. The third is a dural leak. That is the spinal fluid leak that could occur. At certain rare times the bone or the  disc could literally stick to the dura which is the lining which contains the spinal fluid and we could develop a small tear in that lining which we then patch up. That is an extremely rare complication. The last and final complication is a recurrent disc rupture. That means that you could rupture another small piece of disc later on down the road and there is about a 2% chance of that.   H&P performed by Dr. Latanya Maudlin Documented by Ardeen Jourdain, PA-C  [place], Rubbie Goostree Ander Purpura 02/18/2015, 7:42 AM

## 2015-02-18 NOTE — Patient Instructions (Signed)
Tiffany Hood  02/18/2015   Your procedure is scheduled on: Wednesday February 19, 2015   Report to [facility] Main  Entrance take [place]  elevators to 3rd floor to  [place] at 11:00  AM.  Call this number if you have problems the morning of surgery [phone]   Remember: ONLY 1 PERSON MAY GO WITH YOU TO SHORT STAY TO GET  READY MORNING OF [place].  Do not eat foodAfter Midnight but may take clear liquids till 7:00 am day of surgery then nothing by mouth.      Take these medicines the morning of surgery with A SIP OF WATER: Oxycodone-Acetaminophen if needed; May use eye drops if needed DO NOT TAKE ANY DIABETIC MEDICATIONS DAY OF YOUR SURGERY                               You may not have any metal on your body including hair pins and              piercings  Do not wear jewelry, make-up, lotions, powders or perfumes, deodorant             Do not wear nail polish.  Do not shave  48 hours prior to surgery.               Do not bring valuables to the hospital. [place].  Contacts, dentures or bridgework may not be worn into surgery.  Leave suitcase in the car. After surgery it may be brought to your room.                Please read over the following fact sheets you were given:BLOOD TRANSFUSION INFORMATION SHEET; [place]  _____________________________________________________________________             [facility] - Preparing for Surgery Before surgery, you can play an important role.  Because skin is not sterile, your skin needs to be as free of germs as possible.  You can reduce the number of germs on your skin by washing with CHG (chlorahexidine gluconate) soap before surgery.  CHG is an antiseptic cleaner which kills germs and bonds with the skin to continue killing germs even after washing. Please DO NOT use if you have an allergy to CHG or antibacterial soaps.  If your skin  becomes reddened/irritated stop using the CHG and inform your nurse when you arrive at Short Stay. Do not shave (including legs and underarms) for at least 48 hours prior to the first CHG shower.  You may shave your face/neck. Please follow these instructions carefully:  1.  Shower with CHG Soap the night before surgery and the  morning of Surgery.  2.  If you choose to wash your hair, wash your hair first as usual with your  normal  shampoo.  3.  After you shampoo, rinse your hair and body thoroughly to remove the  shampoo.                           4.  Use CHG as you would any other liquid soap.  You can apply chg directly  to the skin and wash  Gently with a scrungie or clean washcloth.  5.  Apply the CHG Soap to your body ONLY FROM THE NECK DOWN.   Do not use on face/ open                           Wound or open sores. Avoid contact with eyes, ears mouth and genitals (private parts).                       Wash face,  Genitals (private parts) with your normal soap.             6.  Wash thoroughly, paying special attention to the area where your surgery  will be performed.  7.  Thoroughly rinse your body with warm water from the neck down.  8.  DO NOT shower/wash with your normal soap after using and rinsing off  the CHG Soap.                9.  Pat yourself dry with a clean towel.            10.  Wear clean pajamas.            11.  Place clean sheets on your bed the night of your first shower and do not  sleep with pets. Day of Surgery : Do not apply any lotions/deodorants the morning of surgery.  Please wear clean clothes to the hospital/surgery center.  FAILURE TO FOLLOW THESE INSTRUCTIONS MAY RESULT IN THE CANCELLATION OF YOUR SURGERY PATIENT SIGNATURE_________________________________  NURSE SIGNATURE__________________________________  ________________________________________________________________________    CLEAR LIQUID DIET   Foods Allowed                                                                      Foods Excluded  Coffee and tea, regular and decaf                             liquids that you cannot  Plain Jell-O in any flavor                                             see through such as: Fruit ices (not with fruit pulp)                                     milk, soups, orange juice  Iced Popsicles                                    All solid food Carbonated beverages, regular and diet                                    Cranberry, grape and apple juices Sports drinks like Gatorade Lightly seasoned clear broth or consume(fat free) Sugar, honey syrup  Sample Menu Breakfast                                Lunch                                     Supper Cranberry juice                    Beef broth                            Chicken broth Jell-O                                     Grape juice                           Apple juice Coffee or tea                        Jell-O                                      Popsicle                                                Coffee or tea                        Coffee or tea  _____________________________________________________________________    Incentive Spirometer  An incentive spirometer is a tool that can help keep your lungs clear and active. This tool measures how well you are filling your lungs with each breath. Taking long deep breaths may help reverse or decrease the chance of developing breathing (pulmonary) problems (especially infection) following:  A long period of time when you are unable to move or be active. BEFORE THE PROCEDURE   If the spirometer includes an indicator to show your best effort, your nurse or respiratory therapist will set it to a desired goal.  If possible, sit up straight or lean slightly forward. Try not to slouch.  Hold the incentive spirometer in an upright position. INSTRUCTIONS FOR USE   Sit on the edge of your bed if possible, or sit up as far  as you can in bed or on a chair.  Hold the incentive spirometer in an upright position.  Breathe out normally.  Place the mouthpiece in your mouth and seal your lips tightly around it.  Breathe in slowly and as deeply as possible, raising the piston or the ball toward the top of the column.  Hold your breath for 3-5 seconds or for as long as possible. Allow the piston or ball to fall to the bottom of the column.  Remove the mouthpiece from your mouth and breathe out normally.  Rest for a few seconds and repeat Steps 1 through 7 at least 10 times every 1-2 hours when you are awake. Take your time and take a few normal breaths between  deep breaths.  The spirometer may include an indicator to show your best effort. Use the indicator as a goal to work toward during each repetition.  After each set of 10 deep breaths, practice coughing to be sure your lungs are clear. If you have an incision (the cut made at the time of surgery), support your incision when coughing by placing a pillow or rolled up towels firmly against it. Once you are able to get out of bed, walk around indoors and cough well. You may stop using the incentive spirometer when instructed by your caregiver.  RISKS AND COMPLICATIONS  Take your time so you do not get dizzy or light-headed.  If you are in pain, you may need to take or ask for pain medication before doing incentive spirometry. It is harder to take a deep breath if you are having pain. AFTER USE  Rest and breathe slowly and easily.  It can be helpful to keep track of a log of your progress. Your caregiver can provide you with a simple table to help with this. If you are using the spirometer at home, follow these instructions: [place] IF:   You are having difficultly using the spirometer.  You have trouble using the spirometer as often as instructed.  Your pain medication is not giving enough relief while using the spirometer.  You develop fever  of 100.5 F (38.1 C) or higher. SEEK IMMEDIATE MEDICAL CARE IF:   You cough up bloody sputum that had not been present before.  You develop fever of 102 F (38.9 C) or greater.  You develop worsening pain at or near the incision site. MAKE SURE YOU:   Understand these instructions.  Will watch your condition.  Will get help right away if you are not doing well or get worse. Document Released: 07/26/2006 Document Revised: 06/07/2011 Document Reviewed: 09/26/2006 ExitCare Patient Information 2014 Exit[employer], [place].   ________________________________________________________________________  WHAT IS A BLOOD TRANSFUSION? Blood Transfusion Information  A transfusion is the replacement of blood or some of its parts. Blood is made up of multiple cells which provide different functions.  Red blood cells carry oxygen and are used for blood loss replacement.  White blood cells fight against infection.  Platelets control bleeding.  Plasma helps clot blood.  Other blood products are available for specialized needs, such as hemophilia or other clotting disorders. BEFORE THE TRANSFUSION  Who gives blood for transfusions?   Healthy volunteers who are fully evaluated to make sure their blood is safe. This is blood bank blood. Transfusion therapy is the safest it has ever been in the practice of medicine. Before blood is taken from a donor, a complete history is taken to make sure that person has no history of diseases nor engages in risky social behavior (examples are intravenous drug use or sexual activity with multiple partners). The donor's travel history is screened to minimize risk of transmitting infections, such as malaria. The donated blood is tested for signs of infectious diseases, such as HIV and hepatitis. The blood is then tested to be sure it is compatible with you in order to minimize the chance of a transfusion reaction. If you or a relative donates blood, this is often done in  anticipation of surgery and is not appropriate for emergency situations. It takes many days to process the donated blood. RISKS AND COMPLICATIONS Although transfusion therapy is very safe and saves many lives, the main dangers of transfusion include:   Getting an infectious disease.  Developing a transfusion reaction. This is an allergic reaction to something in the blood you were given. Every precaution is taken to prevent this. The decision to have a blood transfusion has been considered carefully by your caregiver before blood is given. Blood is not given unless the benefits outweigh the risks. AFTER THE TRANSFUSION  Right after receiving a blood transfusion, you will usually feel much better and more energetic. This is especially true if your red blood cells have gotten low (anemic). The transfusion raises the level of the red blood cells which carry oxygen, and this usually causes an energy increase.  The nurse administering the transfusion will monitor you carefully for complications. HOME CARE INSTRUCTIONS  No special instructions are needed after a transfusion. You may find your energy is better. Speak with your caregiver about any limitations on activity for underlying diseases you may have. SEEK MEDICAL CARE IF:   Your condition is not improving after your transfusion.  You develop redness or irritation at the intravenous (IV) site. SEEK IMMEDIATE MEDICAL CARE IF:  Any of the following symptoms occur over the next 12 hours:  Shaking chills.  You have a temperature by mouth above 102 F (38.9 C), not controlled by medicine.  Chest, back, or muscle pain.  People around you feel you are not acting correctly or are confused.  Shortness of breath or difficulty breathing.  Dizziness and fainting.  You get a rash or develop hives.  You have a decrease in urine output.  Your urine turns a dark color or changes to pink, red, or brown. Any of the following symptoms occur over  the next 10 days:  You have a temperature by mouth above 102 F (38.9 C), not controlled by medicine.  Shortness of breath.  Weakness after normal activity.  The white part of the eye turns yellow (jaundice).  You have a decrease in the amount of urine or are urinating less often.  Your urine turns a dark color or changes to pink, red, or brown. Document Released: 03/12/2000 Document Revised: 06/07/2011 Document Reviewed: 10/30/2007 [facility] Patient Information 2014 [place], [place].  _______________________________________________________________________

## 2015-02-19 ENCOUNTER — Ambulatory Visit (HOSPITAL_COMMUNITY): Payer: Medicare Other

## 2015-02-19 ENCOUNTER — Encounter (HOSPITAL_COMMUNITY): Payer: Self-pay | Admitting: *Deleted

## 2015-02-19 ENCOUNTER — Inpatient Hospital Stay (HOSPITAL_COMMUNITY)
Admission: AD | Admit: 2015-02-19 | Discharge: 2015-02-21 | DRG: 520 | Disposition: A | Discharge status: Home / Self Care | Payer: Medicare Other | Source: Ambulatory Visit | Attending: Orthopedic Surgery | Admitting: Orthopedic Surgery

## 2015-02-19 ENCOUNTER — Encounter (HOSPITAL_COMMUNITY)
Admission: AD | Disposition: A | Discharge status: Home / Self Care | Payer: Self-pay | Source: Ambulatory Visit | Attending: Orthopedic Surgery

## 2015-02-19 ENCOUNTER — Ambulatory Visit (HOSPITAL_COMMUNITY): Payer: Medicare Other | Admitting: Anesthesiology

## 2015-02-19 DIAGNOSIS — Z90710 Acquired absence of both cervix and uterus: Secondary | ICD-10-CM

## 2015-02-19 DIAGNOSIS — Z79891 Long term (current) use of opiate analgesic: Secondary | ICD-10-CM | POA: Diagnosis not present

## 2015-02-19 DIAGNOSIS — E785 Hyperlipidemia, unspecified: Secondary | ICD-10-CM | POA: Diagnosis not present

## 2015-02-19 DIAGNOSIS — M21371 Foot drop, right foot: Secondary | ICD-10-CM | POA: Diagnosis not present

## 2015-02-19 DIAGNOSIS — M4806 Spinal stenosis, lumbar region: Secondary | ICD-10-CM | POA: Diagnosis present

## 2015-02-19 DIAGNOSIS — M5126 Other intervertebral disc displacement, lumbar region: Principal | ICD-10-CM | POA: Diagnosis present

## 2015-02-19 DIAGNOSIS — K5790 Diverticulosis of intestine, part unspecified, without perforation or abscess without bleeding: Secondary | ICD-10-CM | POA: Diagnosis present

## 2015-02-19 DIAGNOSIS — Z79899 Other long term (current) drug therapy: Secondary | ICD-10-CM | POA: Diagnosis not present

## 2015-02-19 DIAGNOSIS — Z8249 Family history of ischemic heart disease and other diseases of the circulatory system: Secondary | ICD-10-CM | POA: Diagnosis not present

## 2015-02-19 DIAGNOSIS — Z9049 Acquired absence of other specified parts of digestive tract: Secondary | ICD-10-CM

## 2015-02-19 DIAGNOSIS — E119 Type 2 diabetes mellitus without complications: Secondary | ICD-10-CM | POA: Diagnosis present

## 2015-02-19 DIAGNOSIS — Z7982 Long term (current) use of aspirin: Secondary | ICD-10-CM | POA: Diagnosis not present

## 2015-02-19 DIAGNOSIS — Z86010 Personal history of colonic polyps: Secondary | ICD-10-CM | POA: Diagnosis not present

## 2015-02-19 DIAGNOSIS — Z419 Encounter for procedure for purposes other than remedying health state, unspecified: Secondary | ICD-10-CM

## 2015-02-19 DIAGNOSIS — M810 Age-related osteoporosis without current pathological fracture: Secondary | ICD-10-CM | POA: Diagnosis present

## 2015-02-19 DIAGNOSIS — Z9889 Other specified postprocedural states: Secondary | ICD-10-CM | POA: Diagnosis not present

## 2015-02-19 DIAGNOSIS — S34109A Unspecified injury to unspecified level of lumbar spinal cord, initial encounter: Secondary | ICD-10-CM | POA: Diagnosis not present

## 2015-02-19 DIAGNOSIS — Z853 Personal history of malignant neoplasm of breast: Secondary | ICD-10-CM | POA: Diagnosis not present

## 2015-02-19 DIAGNOSIS — I10 Essential (primary) hypertension: Secondary | ICD-10-CM | POA: Diagnosis not present

## 2015-02-19 DIAGNOSIS — M48062 Spinal stenosis, lumbar region with neurogenic claudication: Secondary | ICD-10-CM | POA: Diagnosis present

## 2015-02-19 HISTORY — PX: LUMBAR LAMINECTOMY/DECOMPRESSION MICRODISCECTOMY: SHX5026

## 2015-02-19 LAB — HEMOGLOBIN A1C
Hgb A1c MFr Bld: 6.6 % — ABNORMAL HIGH (ref 4.8–5.6)
MEAN PLASMA GLUCOSE: 143 mg/dL

## 2015-02-19 LAB — GLUCOSE, CAPILLARY
GLUCOSE-CAPILLARY: 83 mg/dL (ref 65–99)
Glucose-Capillary: 88 mg/dL (ref 65–99)

## 2015-02-19 LAB — TYPE AND SCREEN
ABO/RH(D): O POS
Antibody Screen: NEGATIVE

## 2015-02-19 SURGERY — LUMBAR LAMINECTOMY/DECOMPRESSION MICRODISCECTOMY 1 LEVEL
Anesthesia: General | Site: Back

## 2015-02-19 MED ORDER — SUCCINYLCHOLINE CHLORIDE 20 MG/ML IJ SOLN
INTRAMUSCULAR | Status: DC | PRN
  Administered 2015-02-19: 100 mg via INTRAVENOUS

## 2015-02-19 MED ORDER — CETYLPYRIDINIUM CHLORIDE 0.05 % MT LIQD
7.0000 mL | Freq: Two times a day (BID) | OROMUCOSAL | Status: DC
  Administered 2015-02-19 – 2015-02-20 (×3): 7 mL via OROMUCOSAL

## 2015-02-19 MED ORDER — HYDROMORPHONE HCL 1 MG/ML IJ SOLN
0.2500 mg | INTRAMUSCULAR | Status: DC | PRN
  Administered 2015-02-19 (×4): 0.5 mg via INTRAVENOUS

## 2015-02-19 MED ORDER — LIDOCAINE HCL (CARDIAC) 20 MG/ML IV SOLN
INTRAVENOUS | Status: AC
  Filled 2015-02-19: qty 5

## 2015-02-19 MED ORDER — POLYVINYL ALCOHOL 1.4 % OP SOLN
1.0000 [drp] | Freq: Every morning | OPHTHALMIC | Status: DC
  Administered 2015-02-20: 1 [drp] via OPHTHALMIC
  Filled 2015-02-19 (×2): qty 15

## 2015-02-19 MED ORDER — SODIUM CHLORIDE 0.9 % IR SOLN
Status: DC | PRN
  Administered 2015-02-19: 500 mL

## 2015-02-19 MED ORDER — ACETAMINOPHEN 650 MG RE SUPP
650.0000 mg | RECTAL | Status: DC | PRN
Start: 2015-02-19 — End: 2015-02-21

## 2015-02-19 MED ORDER — ONDANSETRON HCL 4 MG/2ML IJ SOLN
INTRAMUSCULAR | Status: AC
  Filled 2015-02-19: qty 2

## 2015-02-19 MED ORDER — OXYCODONE-ACETAMINOPHEN 5-325 MG PO TABS
1.0000 | ORAL_TABLET | ORAL | Status: DC | PRN
  Administered 2015-02-19 – 2015-02-20 (×3): 1 via ORAL
  Filled 2015-02-19 (×3): qty 1

## 2015-02-19 MED ORDER — BUPIVACAINE LIPOSOME 1.3 % IJ SUSP
20.0000 mL | Freq: Once | INTRAMUSCULAR | Status: AC
  Administered 2015-02-19: 20 mL
  Filled 2015-02-19: qty 20

## 2015-02-19 MED ORDER — LACTATED RINGERS IV SOLN
INTRAVENOUS | Status: DC
  Administered 2015-02-19: 1000 mL via INTRAVENOUS

## 2015-02-19 MED ORDER — CHLORHEXIDINE GLUCONATE 4 % EX LIQD: 60.0000 mL | Freq: Once | CUTANEOUS | Status: DC

## 2015-02-19 MED ORDER — BACITRACIN-NEOMYCIN-POLYMYXIN 400-5-5000 EX OINT
TOPICAL_OINTMENT | CUTANEOUS | Status: DC | PRN
  Administered 2015-02-19: 1 via TOPICAL

## 2015-02-19 MED ORDER — LOSARTAN POTASSIUM 50 MG PO TABS
50.0000 mg | ORAL_TABLET | Freq: Every day | ORAL | Status: DC
  Administered 2015-02-19: 50 mg via ORAL
  Filled 2015-02-19 (×2): qty 1

## 2015-02-19 MED ORDER — BUPIVACAINE-EPINEPHRINE (PF) 0.5% -1:200000 IJ SOLN
INTRAMUSCULAR | Status: AC
  Filled 2015-02-19: qty 30

## 2015-02-19 MED ORDER — CEFAZOLIN SODIUM 1-5 GM-% IV SOLN
1.0000 g | Freq: Three times a day (TID) | INTRAVENOUS | Status: AC
  Administered 2015-02-19 – 2015-02-20 (×3): 1 g via INTRAVENOUS
  Filled 2015-02-19 (×3): qty 50

## 2015-02-19 MED ORDER — GLYCOPYRROLATE 0.2 MG/ML IJ SOLN
INTRAMUSCULAR | Status: DC | PRN
  Administered 2015-02-19: .6 mg via INTRAVENOUS

## 2015-02-19 MED ORDER — FENTANYL CITRATE (PF) 100 MCG/2ML IJ SOLN
INTRAMUSCULAR | Status: AC
  Filled 2015-02-19: qty 2

## 2015-02-19 MED ORDER — ROCURONIUM BROMIDE 100 MG/10ML IV SOLN
INTRAVENOUS | Status: AC
  Filled 2015-02-19: qty 1

## 2015-02-19 MED ORDER — LACTATED RINGERS IV SOLN
INTRAVENOUS | Status: DC
  Administered 2015-02-19 – 2015-02-20 (×2): via INTRAVENOUS

## 2015-02-19 MED ORDER — THROMBIN 5000 UNITS EX SOLR
OROMUCOSAL | Status: DC | PRN
  Administered 2015-02-19: 10 mL via TOPICAL

## 2015-02-19 MED ORDER — FENTANYL CITRATE (PF) 100 MCG/2ML IJ SOLN
INTRAMUSCULAR | Status: DC | PRN
  Administered 2015-02-19: 100 ug via INTRAVENOUS
  Administered 2015-02-19 (×4): 50 ug via INTRAVENOUS

## 2015-02-19 MED ORDER — HYDROMORPHONE HCL 1 MG/ML IJ SOLN
INTRAMUSCULAR | Status: AC
  Filled 2015-02-19: qty 1

## 2015-02-19 MED ORDER — PROPOFOL 10 MG/ML IV BOLUS
INTRAVENOUS | Status: DC | PRN
  Administered 2015-02-19: 160 mg via INTRAVENOUS

## 2015-02-19 MED ORDER — PROPOFOL 10 MG/ML IV BOLUS
INTRAVENOUS | Status: AC
  Filled 2015-02-19: qty 20

## 2015-02-19 MED ORDER — NEOSTIGMINE METHYLSULFATE 10 MG/10ML IV SOLN
INTRAVENOUS | Status: DC | PRN
  Administered 2015-02-19: 4 mg via INTRAVENOUS

## 2015-02-19 MED ORDER — CEFAZOLIN SODIUM-DEXTROSE 2-3 GM-% IV SOLR
INTRAVENOUS | Status: AC
  Filled 2015-02-19: qty 50

## 2015-02-19 MED ORDER — TRIAMTERENE-HCTZ 75-50 MG PO TABS
0.5000 | ORAL_TABLET | Freq: Every day | ORAL | Status: DC
  Administered 2015-02-19: 0.5 via ORAL
  Filled 2015-02-19 (×2): qty 0.5

## 2015-02-19 MED ORDER — ONDANSETRON HCL 4 MG/2ML IJ SOLN
4.0000 mg | INTRAMUSCULAR | Status: DC | PRN
  Administered 2015-02-19: 4 mg via INTRAVENOUS
  Filled 2015-02-19: qty 2

## 2015-02-19 MED ORDER — FLEET ENEMA 7-19 GM/118ML RE ENEM: 1.0000 | ENEMA | Freq: Once | RECTAL | Status: DC | PRN

## 2015-02-19 MED ORDER — BACITRACIN-NEOMYCIN-POLYMYXIN 400-5-5000 EX OINT
TOPICAL_OINTMENT | CUTANEOUS | Status: AC
  Filled 2015-02-19: qty 1

## 2015-02-19 MED ORDER — LIDOCAINE HCL (CARDIAC) 20 MG/ML IV SOLN
INTRAVENOUS | Status: DC | PRN
  Administered 2015-02-19: 50 mg via INTRAVENOUS

## 2015-02-19 MED ORDER — CEFAZOLIN SODIUM-DEXTROSE 2-3 GM-% IV SOLR
2.0000 g | INTRAVENOUS | Status: AC
  Administered 2015-02-19: 2 g via INTRAVENOUS

## 2015-02-19 MED ORDER — ONDANSETRON HCL 4 MG/2ML IJ SOLN
INTRAMUSCULAR | Status: DC | PRN
  Administered 2015-02-19: 4 mg via INTRAVENOUS

## 2015-02-19 MED ORDER — LACTATED RINGERS IV SOLN: INTRAVENOUS | Status: DC

## 2015-02-19 MED ORDER — METHOCARBAMOL 500 MG PO TABS
500.0000 mg | ORAL_TABLET | Freq: Four times a day (QID) | ORAL | Status: DC | PRN
  Administered 2015-02-20 – 2015-02-21 (×4): 500 mg via ORAL
  Filled 2015-02-19 (×4): qty 1

## 2015-02-19 MED ORDER — PRAVASTATIN SODIUM 40 MG PO TABS
40.0000 mg | ORAL_TABLET | Freq: Every day | ORAL | Status: DC
  Administered 2015-02-19 – 2015-02-21 (×3): 40 mg via ORAL
  Filled 2015-02-19 (×3): qty 1

## 2015-02-19 MED ORDER — BISACODYL 5 MG PO TBEC: 5.0000 mg | DELAYED_RELEASE_TABLET | Freq: Every day | ORAL | Status: DC | PRN

## 2015-02-19 MED ORDER — HYDROCODONE-ACETAMINOPHEN 5-325 MG PO TABS
1.0000 | ORAL_TABLET | ORAL | Status: DC | PRN
  Administered 2015-02-20 – 2015-02-21 (×4): 1 via ORAL
  Filled 2015-02-19 (×4): qty 1

## 2015-02-19 MED ORDER — ACETAMINOPHEN 325 MG PO TABS
650.0000 mg | ORAL_TABLET | ORAL | Status: DC | PRN
Start: 2015-02-19 — End: 2015-02-21

## 2015-02-19 MED ORDER — PHENOL 1.4 % MT LIQD: 1.0000 | OROMUCOSAL | Status: DC | PRN

## 2015-02-19 MED ORDER — POLYETHYLENE GLYCOL 3350 17 G PO PACK: 17.0000 g | PACK | Freq: Every day | ORAL | Status: DC | PRN

## 2015-02-19 MED ORDER — METHOCARBAMOL 1000 MG/10ML IJ SOLN
500.0000 mg | Freq: Four times a day (QID) | INTRAVENOUS | Status: DC | PRN
  Administered 2015-02-19: 500 mg via INTRAVENOUS
  Filled 2015-02-19 (×2): qty 5

## 2015-02-19 MED ORDER — HYDROMORPHONE HCL 1 MG/ML IJ SOLN: 0.5000 mg | INTRAMUSCULAR | Status: DC | PRN

## 2015-02-19 MED ORDER — BUPIVACAINE-EPINEPHRINE 0.5% -1:200000 IJ SOLN
INTRAMUSCULAR | Status: DC | PRN
  Administered 2015-02-19: 20 mL

## 2015-02-19 MED ORDER — MENTHOL 3 MG MT LOZG: 1.0000 | LOZENGE | OROMUCOSAL | Status: DC | PRN

## 2015-02-19 MED ORDER — FENTANYL CITRATE (PF) 250 MCG/5ML IJ SOLN
INTRAMUSCULAR | Status: AC
  Filled 2015-02-19: qty 5

## 2015-02-19 MED ORDER — ROCURONIUM BROMIDE 100 MG/10ML IV SOLN
INTRAVENOUS | Status: DC | PRN
  Administered 2015-02-19: 5 mg via INTRAVENOUS
  Administered 2015-02-19: 10 mg via INTRAVENOUS
  Administered 2015-02-19: 35 mg via INTRAVENOUS

## 2015-02-19 MED ORDER — SODIUM CHLORIDE 0.9 % IR SOLN
Status: AC
  Filled 2015-02-19: qty 1

## 2015-02-19 SURGICAL SUPPLY — 40 items
BAG SPEC THK2 15X12 ZIP CLS (MISCELLANEOUS) ×1
BAG ZIPLOCK 12X15 (MISCELLANEOUS) ×2 IMPLANT
CLEANER TIP ELECTROSURG 2X2 (MISCELLANEOUS) ×2 IMPLANT
DRAPE MICROSCOPE LEICA (MISCELLANEOUS) ×2 IMPLANT
DRAPE POUCH INSTRU U-SHP 10X18 (DRAPES) ×2 IMPLANT
DRAPE SHEET LG 3/4 BI-LAMINATE (DRAPES) ×2 IMPLANT
DRAPE SURG 17X11 SM STRL (DRAPES) ×2 IMPLANT
DRSG ADAPTIC 3X8 NADH LF (GAUZE/BANDAGES/DRESSINGS) ×2 IMPLANT
DRSG PAD ABDOMINAL 8X10 ST (GAUZE/BANDAGES/DRESSINGS) ×4 IMPLANT
DURAPREP 26ML APPLICATOR (WOUND CARE) ×2 IMPLANT
ELECT BLADE TIP CTD 4 INCH (ELECTRODE) ×2 IMPLANT
ELECT REM PT RETURN 9FT ADLT (ELECTROSURGICAL) ×2
ELECTRODE REM PT RTRN 9FT ADLT (ELECTROSURGICAL) ×1 IMPLANT
GAUZE SPONGE 4X4 12PLY STRL (GAUZE/BANDAGES/DRESSINGS) ×2 IMPLANT
GLOVE BIOGEL PI IND STRL 8 (GLOVE) ×1 IMPLANT
GLOVE BIOGEL PI INDICATOR 8 (GLOVE) ×1
GLOVE ECLIPSE 8.0 STRL XLNG CF (GLOVE) ×2 IMPLANT
GOWN STRL REUS W/TWL XL LVL3 (GOWN DISPOSABLE) ×4 IMPLANT
KIT BASIN OR (CUSTOM PROCEDURE TRAY) ×2 IMPLANT
KIT POSITIONING SURG ANDREWS (MISCELLANEOUS) ×2 IMPLANT
MANIFOLD NEPTUNE II (INSTRUMENTS) ×2 IMPLANT
NDL SPNL 18GX3.5 QUINCKE PK (NEEDLE) ×2 IMPLANT
NEEDLE HYPO 22GX1.5 SAFETY (NEEDLE) ×2 IMPLANT
NEEDLE SPNL 18GX3.5 QUINCKE PK (NEEDLE) ×4 IMPLANT
PACK LAMINECTOMY ORTHO (CUSTOM PROCEDURE TRAY) ×2 IMPLANT
PAD ABD 8X10 STRL (GAUZE/BANDAGES/DRESSINGS) ×1 IMPLANT
PATTIES SURGICAL .5 X.5 (GAUZE/BANDAGES/DRESSINGS) ×2 IMPLANT
PATTIES SURGICAL .75X.75 (GAUZE/BANDAGES/DRESSINGS) IMPLANT
PATTIES SURGICAL 1X1 (DISPOSABLE) IMPLANT
PEN SKIN MARKING BROAD (MISCELLANEOUS) ×2 IMPLANT
SPONGE LAP 4X18 X RAY DECT (DISPOSABLE) ×2 IMPLANT
SPONGE SURGIFOAM ABS GEL 100 (HEMOSTASIS) ×2 IMPLANT
STAPLER VISISTAT 35W (STAPLE) ×2 IMPLANT
SUT VIC AB 0 CT1 27 (SUTURE) ×2
SUT VIC AB 0 CT1 27XBRD ANTBC (SUTURE) ×1 IMPLANT
SUT VIC AB 1 CT1 27 (SUTURE) ×6
SUT VIC AB 1 CT1 27XBRD ANTBC (SUTURE) ×3 IMPLANT
SYR 20CC LL (SYRINGE) ×2 IMPLANT
TAPE CLOTH SURG 6X10 WHT LF (GAUZE/BANDAGES/DRESSINGS) ×1 IMPLANT
TOWEL OR 17X26 10 PK STRL BLUE (TOWEL DISPOSABLE) ×2 IMPLANT

## 2015-02-19 NOTE — Anesthesia Postprocedure Evaluation (Signed)
Anesthesia Post Note  Patient: Tiffany Hood  Procedure(s) Performed: Procedure(s) (LRB): MICRODISCECTOMY AT L4-L5, CENTRAL DECOMPRESSION L4-L5 FOR SPINAL STENOSIS, FORAMINOTOMY FOR L4 L5 ROOT ON RIGHT (N/A)  Patient location during evaluation: PACU Anesthesia Type: General Level of consciousness: awake and alert Pain management: pain level controlled Vital Signs Assessment: post-procedure vital signs reviewed and stable Respiratory status: spontaneous breathing, nonlabored ventilation, respiratory function stable and patient connected to nasal cannula oxygen Cardiovascular status: blood pressure returned to baseline and stable Postop Assessment: No signs of nausea or vomiting Anesthetic complications: no    Last Vitals:  Filed Vitals:   02/19/15 1630 02/19/15 1708  BP: 140/68 137/61  Pulse: 69 69  Temp: 36.7 C 36.4 C  Resp: 15 15    Last Pain:  Filed Vitals:   02/19/15 1727  PainSc: 2     LLE Motor Response: Purposeful movement LLE Sensation: Full sensation RLE Motor Response: Purposeful movement RLE Sensation: Full sensation      Avani Sensabaugh L

## 2015-02-19 NOTE — Op Note (Signed)
Tiffany Hood, Tiffany Hood              ACCOUNT NO.:  [id number]  MEDICAL RECORD NO.:  [id number]  LOCATION:  42                         FACILITY:  [facility]  PHYSICIAN:  Kipp Brood. Roldan Laforest, M.D.DATE OF BIRTH:  [birth year]  DATE OF PROCEDURE: DATE OF DISCHARGE:                              OPERATIVE REPORT   SURGEON:  Cathlin Buchan A. Gladstone Lighter, M.D.  ASSISTANT:  Tarri Glenn, M.D.  PREOPERATIVE DIAGNOSES: 1. 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 2. Spinal stenosis at L4-5. 3. Foraminal stenosis involving the L4 root. 4. Foraminal stenosis involving the L5 root on the right. 5. Partial footdrop on the right.  POSTOPERATIVE DIAGNOSES: 1. 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 2. Spinal stenosis at L4-5. 3. Foraminal stenosis involving the L4 root. 4. Foraminal stenosis involving the L5 root on the right. 5. Partial footdrop on the right.  OPERATION: 1. Central decompressive lumbar laminectomy at L4-5 for spinal     stenosis. 2. Foraminotomies for the L4 root on the right for foraminal stenosis. 3. Foraminotomy for the L5 root for foraminal stenosis on the right. 4. Microdiskectomy for a large cephalad migrating type herniated     lumbar disk, L4-5 on the right.  DESCRIPTION OF PROCEDURE:  Under general anesthesia, the patient on spinal frame, routine orthopedic prep and draping of the back was carried out.  Appropriate time-out was first carried out.  I also marked the appropriate right side of the back in the holding area.  At this time, 2 needles were placed in the back for localization purposes, and an x-ray was taken.  At this point, the incision then was made over the L4-5 interspace.  The incision was extended proximally and distally in the usual fashion.  Prior to doing that, I injected 20 mL of 0.5% Marcaine, epinephrine into the wound site to control the bleeding.  We then went down and stripped the muscle from the lamina  spinous process of L4-5 region.  Two Kocher clamps were placed on the spinous processes. An x-ray was taken.  Once we identified the L4 spinous process, we then began our central decompressive lumbar laminectomy in the usual fashion. We extended the laminectomy distally and proximally because of the extreme large herniated disk.  The microscope was brought in.  We first went out and decompressed the lateral recess on the right at 4-5.  We carried out foraminotomies for the 5 root, the 4 root to relieve the pressure.  Another x-ray was taken.  Following that, we then went down and identified the L4-5 disk and a cruciate incision was made in the posterior longitudinal ligament and a complete diskectomy was carried out.  Note, we had to go proximal, we carried out a hemilaminectomy above to go proximally and decompressed the recess, and we retrieved multiple pieces of disk material that migrated from the disk space up along the gutter along the recess on the right up to the root above.  We thoroughly decompressed the area, there was an indentation in the dura at the site of the herniation.  Note, multiple fragments were removed. We utilized the nerve hook as well as the Epstein curette to go subligamentous as  well.  After this was done, we were able to easy pass the hockey-stick out to foramen of the 4 root and the 5 root, the dura was totally free.  We thoroughly irrigated out the area, loosely applied some thrombin-soaked Gelfoam.  The wound was closed in layers in usual fashion except I left a small distal deep and proximal part of the wound open for drainage purposes, and I injected 20 mL of Exparel into the muscle and subcu.  The remaining part of the wound was closed with #1 Vicryl sutures.  Skin with metal staples and a sterile Neosporin dressing was applied.  Prior to surgery, she had 2 g of IV Ancef.          ______________________________ Kipp Brood. Gladstone Lighter,  M.D.     RAG/MEDQ  D:  02/19/2015  T:  02/19/2015  Job:  [id number]

## 2015-02-19 NOTE — Brief Op Note (Signed)
02/19/2015  3:53 PM  PATIENT:  Tiffany Hood  74 y.o. female  PRE-OPERATIVE DIAGNOSIS:Herniated Lumbar Disc at L-4-L-5 on the right.Spinal Stenosis L-4-L-5 and Foraminal Stenosis involving the L-4 and L-5 Nerve Roots.Partial Foot Drop on the Right.  POST-OPERATIVE DIAGNOSIS: Same as Pre-Op  PROCEDURE:  Procedure(s): MICRODISCECTOMY AT L4-L5, CENTRAL DECOMPRESSION L4-L5 FOR SPINAL STENOSIS, FORAMINOTOMY FOR L4 L5 ROOT ON RIGHT (N/A)   Microdiscectomy was for a Herniated Lumbar Disc.  SURGEON:  Surgeon(s) and Role:    * Latanya Maudlin, MD - Primary    * Magnus Sinning, MD - Assisting    ASSISTANTS:James Aplington MD   ANESTHESIA:   general  EBL:  Total I/O In: 1000 [I.V.:1000] Out: 250 [Urine:200; Blood:50]  BLOOD ADMINISTERED:none  DRAINS: none   LOCAL MEDICATIONS USED:  MARCAINE 20cc  0.50% with Epinephrine at the start of the case and 20cc of Exparel at the end of the case.  SPECIMEN:  Source of Specimen:  L-4-L-5 interspace  DISPOSITION OF SPECIMEN:  PATHOLOGY  COUNTS:  YES  TOURNIQUET:  * No tourniquets in log *  DICTATION: .Other Dictation: Dictation Number [id number]  PLAN OF CARE: Admit to inpatient   PATIENT DISPOSITION:  PACU - hemodynamically stable.   Delay start of Pharmacological VTE agent (>24hrs) due to surgical blood loss or risk of bleeding: yes

## 2015-02-19 NOTE — Interval H&P Note (Signed)
History and Physical Interval Note:  02/19/2015 12:42 PM  Tiffany Hood  has presented today for surgery, with the diagnosis of DISC RUPTURE L4- L5   The various methods of treatment have been discussed with the patient and family. After consideration of risks, benefits and other options for treatment, the patient has consented to  Procedure(s): CENTRAL DECOMPRESSION LUMBAR LAMINECTOMY L4-L6  (1 LEVEL) (N/A) as a surgical intervention .  The patient's history has been reviewed, patient examined, no change in status, stable for surgery.  I have reviewed the patient's chart and labs.  Questions were answered to the patient's satisfaction.     Navy Belay A

## 2015-02-19 NOTE — Interval H&P Note (Signed)
History and Physical Interval Note:  02/19/2015 12:44 PM  Tiffany Hood  has presented today for surgery, with the diagnosis of DISC RUPTURE L4- L5   The various methods of treatment have been discussed with the patient and family. After consideration of risks, benefits and other options for treatment, the patient has consented to  Procedure(s): CENTRAL DECOMPRESSION LUMBAR LAMINECTOMY L4-L6  (1 LEVEL) (N/A) as a surgical intervention .  The patient's history has been reviewed, patient examined, no change in status, stable for surgery.  I have reviewed the patient's chart and labs.  Questions were answered to the patient's satisfaction.     [name] A

## 2015-02-19 NOTE — Transfer of Care (Signed)
Immediate Anesthesia Transfer of Care Note  Patient: Tiffany Hood  Procedure(s) Performed: Procedure(s): MICRODISCECTOMY AT L4-L5, CENTRAL DECOMPRESSION L4-L5 FOR SPINAL STENOSIS, FORAMINOTOMY FOR L4 L5 ROOT ON RIGHT (N/A)  Patient Location: PACU  Anesthesia Type:General  Level of Consciousness: awake, alert , oriented and patient cooperative  Airway & Oxygen Therapy: Patient Spontanous Breathing and Patient connected to face mask oxygen  Post-op Assessment: Report given to RN, Post -op Vital signs reviewed and stable and Patient moving all extremities  Post vital signs: Reviewed and stable  Last Vitals:  Filed Vitals:   02/19/15 1129  BP: 131/64  Pulse: 71  Temp: 36.3 C  Resp: 18    Complications: No apparent anesthesia complications

## 2015-02-19 NOTE — Anesthesia Procedure Notes (Signed)
Procedure Name: Intubation Date/Time: 02/19/2015 1:42 PM Performed by: Noralyn Pick D Pre-anesthesia Checklist: Patient identified, Emergency Drugs available, Suction available and Patient being monitored Patient Re-evaluated:Patient Re-evaluated prior to inductionOxygen Delivery Method: Circle System Utilized Preoxygenation: Pre-oxygenation with 100% oxygen Intubation Type: IV induction Ventilation: Mask ventilation without difficulty Grade View: Grade III Tube type: Oral Tube size: 7.5 mm Number of attempts: 1 Airway Equipment and Method: Stylet and Oral airway Placement Confirmation: ETT inserted through vocal cords under direct vision,  positive ETCO2 and breath sounds checked- equal and bilateral Secured at: 21 cm Tube secured with: Tape Dental Injury: Teeth and Oropharynx as per pre-operative assessment

## 2015-02-19 NOTE — Anesthesia Preprocedure Evaluation (Signed)
Anesthesia Evaluation  Patient identified by MRN, date of birth, ID band Patient awake    Reviewed: Allergy & Precautions, H&P , NPO status , Patient's Chart, lab work & pertinent test results  History of Anesthesia Complications Negative for: history of anesthetic complications  Airway Mallampati: I  TM Distance: >3 FB Neck ROM: Full    Dental no notable dental hx. (+) Teeth Intact, Dental Advisory Given   Pulmonary neg pulmonary ROS,    Pulmonary exam normal breath sounds clear to auscultation       Cardiovascular Exercise Tolerance: Good hypertension, Pt. on medications Normal cardiovascular exam Rhythm:Regular Rate:Normal     Neuro/Psych negative neurological ROS  negative psych ROS   GI/Hepatic negative GI ROS, Neg liver ROS,   Endo/Other  diabetesprediabetes  Renal/GU negative Renal ROS  negative genitourinary   Musculoskeletal   Abdominal   Peds  Hematology negative hematology ROS (+)   Anesthesia Other Findings Breast cancer  Reproductive/Obstetrics negative OB ROS                             Anesthesia Physical Anesthesia Plan  ASA: III  Anesthesia Plan: General   Post-op Pain Management:    Induction: Intravenous  Airway Management Planned: Oral ETT  Additional Equipment:   Intra-op Plan:   Post-operative Plan: Extubation in OR  Informed Consent: I have reviewed the patients History and Physical, chart, labs and discussed the procedure including the risks, benefits and alternatives for the proposed anesthesia with the patient or authorized representative who has indicated his/her understanding and acceptance.   Dental Advisory Given  Plan Discussed with: CRNA and Surgeon  Anesthesia Plan Comments:         Anesthesia Quick Evaluation

## 2015-02-20 MED ORDER — LOSARTAN POTASSIUM 50 MG PO TABS
50.0000 mg | ORAL_TABLET | Freq: Every day | ORAL | Status: DC
  Filled 2015-02-20: qty 1

## 2015-02-20 MED ORDER — METHOCARBAMOL 500 MG PO TABS: 500.0000 mg | ORAL_TABLET | Freq: Four times a day (QID) | ORAL | Status: DC | PRN

## 2015-02-20 MED ORDER — OXYCODONE-ACETAMINOPHEN 5-325 MG PO TABS: 1.0000 | ORAL_TABLET | ORAL | Status: DC | PRN

## 2015-02-20 MED ORDER — TRIAMTERENE-HCTZ 75-50 MG PO TABS
0.5000 | ORAL_TABLET | Freq: Every day | ORAL | Status: DC
  Filled 2015-02-20: qty 0.5

## 2015-02-20 NOTE — Evaluation (Signed)
Occupational Therapy Evaluation Patient Details Name: Tiffany Hood MRN: [id number] DOB: 05-18-1940 Today's Date: 02/20/2015    History of Present Illness s/p L4-5 decompression   Clinical Impression   This 74 year old female was admitted for the above surgery. She will benefit from continued OT in acute setting to reinforce back precautions; goals are for mod I as pt lives alone and will have limited support.  Pt was independent prior to admission    Follow Up Recommendations  No OT follow up;Supervision - Intermittent    Equipment Recommendations  None recommended by OT    Recommendations for Other Services       Precautions / Restrictions Precautions Precautions: Back Restrictions Weight Bearing Restrictions: No      Mobility Bed Mobility Overal bed mobility: Needs Assistance Bed Mobility: Sidelying to Sit;Sit to Supine   Sidelying to sit: Supervision   Sit to supine: Supervision   General bed mobility comments: cues for technique  Transfers Overall transfer level: Needs assistance Equipment used: None Transfers: Sit to/from Stand Sit to Stand: Supervision         General transfer comment: cues for back precautions    Balance                                            ADL Overall ADL's : Needs assistance/impaired     Grooming: Oral care;Supervision/safety;Standing   Upper Body Bathing: Set up;Sitting   Lower Body Bathing: Supervison/ safety;Sit to/from stand;With adaptive equipment   Upper Body Dressing : Set up;Sitting   Lower Body Dressing: Minimal assistance;Sit to/from stand;Bed level;With adaptive equipment   Toilet Transfer: Min guard;Ambulation;Comfort height toilet   Toileting- Clothing Manipulation and Hygiene: Moderate assistance;Sit to/from stand         General ADL Comments: worked through [employer].  Educated on AE, especially toilet aide and reacher:  pt will be alone.  RN lives nearby and will change  dressing.  Needs reinforcement with back precautions during functional activities     Vision     Perception     Praxis      Pertinent Vitals/Pain Pain Assessment: 0-10 Pain Score: 4  Pain Location: back Pain Descriptors / Indicators: Sore Pain Intervention(s): Limited activity within patient's tolerance;Monitored during session;Premedicated before session;Repositioned     Hand Dominance     Extremity/Trunk Assessment Upper Extremity Assessment Upper Extremity Assessment: Overall WFL for tasks assessed           Communication Communication Communication: No difficulties   Cognition Arousal/Alertness: Awake/alert Behavior During Therapy: WFL for tasks assessed/performed Overall Cognitive Status: Within Functional Limits for tasks assessed                     General Comments       Exercises       Shoulder Instructions      Home Living Family/patient expects to be discharged to:: Private residence Living Arrangements: Alone                 Bathroom Shower/Tub: Occupational psychologist: Handicapped height     Home Equipment: Shower seat - built in   Additional Comments: can borrow walker if needed      Prior Functioning/Environment Level of Independence: Independent             OT Diagnosis: Acute pain   OT Problem  List: Decreased knowledge of use of DME or AE;Decreased knowledge of precautions;Pain   OT Treatment/Interventions: Self-care/ADL training;DME and/or AE instruction;Patient/family education    OT Goals(Current goals can be found in the care plan section) Acute Rehab OT Goals Patient Stated Goal: decreased pain and return to being independent OT Goal Formulation: With patient Time For Goal Achievement: 02/27/15 Potential to Achieve Goals: Good ADL Goals Pt Will Transfer to Toilet: with modified independence;ambulating Pt Will Perform Tub/Shower Transfer: Shower transfer;with modified independence;shower  seat Additional ADL Goal #1: pt will perform adl at mod I level including clothing retrieval, AE, adapted technique Additional ADL Goal #2: pt will not need cues for back precautions during activity  OT Frequency: Min 2X/week   Barriers to D/C:            Co-evaluation              End of Session    Activity Tolerance: Patient tolerated treatment well Patient left: in bed;with call bell/phone within reach;with bed alarm set   Time: [id number] OT Time Calculation (min): 42 min Charges:  OT General Charges $OT Visit: 1 Procedure OT Evaluation $Initial OT Evaluation Tier I: 1 Procedure OT Treatments $Self Care/Home Management : 23-37 mins G-Codes:    Dellis Voght March 04, 2015, 8:37 AM  Lesle Chris, OTR/L [phone] 04-Mar-2015

## 2015-02-20 NOTE — Evaluation (Signed)
Physical Therapy Evaluation Patient Details Name: Tiffany Hood MRN: [id number] DOB: 1940/09/11 Today's Date: 02/20/2015   History of Present Illness  s/p L4-5 decompression  Clinical Impression  Patient is s/p above surgery resulting in the deficits listed below (see PT Problem List). Pt mobilizing well, she walked 300' without assistive device. Encouraged pt to walk in halls TID independently. Will do stair training next session. Patient will benefit from skilled PT to increase their independence and safety with mobility (while adhering to their precautions) to allow discharge to the venue listed below.     Follow Up Recommendations No PT follow up    Equipment Recommendations  None recommended by PT    Recommendations for Other Services       Precautions / Restrictions Precautions Precautions: Back, handout in room Restrictions Weight Bearing Restrictions: No      Mobility  Bed Mobility Overal bed mobility: Needs Assistance Bed Mobility: Sidelying to Sit;Sit to Supine   Sidelying to sit: Supervision   Sit to supine: Supervision   General bed mobility comments: cues for technique  Transfers Overall transfer level: Modified independent Equipment used: None Transfers: Sit to/from Stand Sit to Stand: Independent         General transfer comment: pushed up with armrests  Ambulation/Gait Ambulation/Gait assistance: Independent Ambulation Distance (Feet): 300 Feet Assistive device: None Gait Pattern/deviations: WFL(Within Functional Limits)   Gait velocity interpretation: at or above normal speed for age/gender General Gait Details: steady, no LOB, incisional pain only  Stairs            Wheelchair Mobility    Modified Rankin (Stroke Patients Only)       Balance Overall balance assessment: Independent                                           Pertinent Vitals/Pain Pain Assessment: 0-10 Pain Score: 6  Pain Location:  incision site with mobility Pain Descriptors / Indicators: Sore Pain Intervention(s): Monitored during session;Limited activity within patient's tolerance    Home Living Family/patient expects to be discharged to:: Private residence Living Arrangements: Alone (son coming Fri-Sunday, neighbors also can assist PRN) Available Help at Discharge: Family;Neighbor   Home Access: Stairs to enter Entrance Stairs-Rails: Right;Left;Can reach both Technical brewer of Steps: 3 Home Layout: One level Home Equipment: Shower seat - built in;Cane - single point (can borrow rollator from neighbor) Additional Comments: can borrow walker if needed    Prior Function Level of Independence: Independent               Hand Dominance        Extremity/Trunk Assessment   Upper Extremity Assessment: Overall WFL for tasks assessed           Lower Extremity Assessment: Overall WFL for tasks assessed (sensation intact to light touch BLEs)      Cervical / Trunk Assessment: Normal  Communication   Communication: No difficulties  Cognition Arousal/Alertness: Awake/alert Behavior During Therapy: WFL for tasks assessed/performed Overall Cognitive Status: Within Functional Limits for tasks assessed                      General Comments      Exercises        Assessment/Plan    PT Assessment Patient needs continued PT services  PT Diagnosis Acute pain   PT Problem List Decreased mobility;Pain  PT Treatment Interventions Stair training;Gait training   PT Goals (Current goals can be found in the Care Plan section) Acute Rehab PT Goals Patient Stated Goal: decreased pain and return to being independent PT Goal Formulation: With patient Time For Goal Achievement: 02/23/15 Potential to Achieve Goals: Good    Frequency 7X/week   Barriers to discharge        Co-evaluation               End of Session Equipment Utilized During Treatment: Gait belt Activity  Tolerance: Patient tolerated treatment well Patient left: in chair;with call bell/phone within reach Nurse Communication: Mobility status (safe to walk in halls independently)         Time: [id number] PT Time Calculation (min) (ACUTE ONLY): 24 min   Charges:   PT Evaluation $Initial PT Evaluation Tier I: 1 Procedure PT Treatments $Gait Training: 8-22 mins   PT G Codes:        Philomena Doheny 02/20/2015, 11:13 AM [phone]

## 2015-02-20 NOTE — Discharge Instructions (Signed)
For the first three days, remove your dressing, tape a piece of saran wrap over your incision. Take your shower, then remove the saran wrap and put a clean dressing on. After three days you can shower without the saran wrap.  Call Dr. Gladstone Lighter if any wound complications or temperature of 101 degrees F or over.  Take aspirin 325mg  daily to prevent blood clots. Call the office for an appointment to see Dr. Gladstone Lighter in two weeks: [phone] and ask for Dr. Charlestine Night nurse, Brunilda Payor.

## 2015-02-20 NOTE — Progress Notes (Addendum)
Subjective: 1 Day Post-Op Procedure(s) (LRB): MICRODISCECTOMY AT L4-L5, CENTRAL DECOMPRESSION L4-L5 FOR SPINAL STENOSIS, FORAMINOTOMY FOR L4 L5 ROOT ON RIGHT (N/A) Patient reports pain as 2 on 0-10 scale.  Doing well, Plan on DC tomorrow.  Objective: Vital signs in last 24 hours: Temp:  [97.3 F (36.3 C)-98.6 F (37 C)] 98.2 F (36.8 C) (11/24 0631) Pulse Rate:  [67-93] 73 (11/24 0631) Resp:  [15-18] 16 (11/24 0631) BP: (105-142)/(50-76) 105/50 mmHg (11/24 0631) SpO2:  [99 %-100 %] 100 % (11/24 0631) Weight:  [74.503 kg (164 lb 4 oz)] 74.503 kg (164 lb 4 oz) (11/23 1129)  Intake/Output from previous day: 11/23 0701 - 11/24 0700 In: 3146.7 [P.O.:720; I.V.:2376.7; IV Piggyback:50] Out: [id number] [Urine:5750; Blood:50] Intake/Output this shift:     Recent Labs  02/18/15 1130  HGB 12.5    Recent Labs  02/18/15 1130  WBC 7.4  RBC 4.37  HCT 39.3  PLT 209    Recent Labs  02/18/15 1130  NA 139  K 4.1  CL 100*  CO2 31  BUN 27*  CREATININE 0.67  GLUCOSE 99  CALCIUM 9.3    Recent Labs  02/18/15 1130  INR 0.92    Dorsiflexion/Plantar flexion intact Compartment soft  Assessment/Plan: 1 Day Post-Op Procedure(s) (LRB): MICRODISCECTOMY AT L4-L5, CENTRAL DECOMPRESSION L4-L5 FOR SPINAL STENOSIS, FORAMINOTOMY FOR L4 L5 ROOT ON RIGHT (N/A) Up with therapy.DC tomorrow.  Tuff Clabo A 02/20/2015, 7:05 AM

## 2015-02-21 ENCOUNTER — Encounter (HOSPITAL_COMMUNITY): Payer: Self-pay | Admitting: Orthopedic Surgery

## 2015-02-21 NOTE — Progress Notes (Signed)
Occupational Therapy Treatment Patient Details Name: Tiffany Hood MRN: [id number] DOB: Sep 29, 1940 Today's Date: 02/21/2015    History of present illness s/p L4-5 decompression   OT comments   Pt limited by pain this session; will return later this morning.  Follow Up Recommendations   (initial 24/7 assistance then intermittent)    Equipment Recommendations  None recommended by OT    Recommendations for Other Services      Precautions / Restrictions Precautions Precautions: Back Restrictions Weight Bearing Restrictions: No       Mobility Bed Mobility               General bed mobility comments: attempted but unable to tolerate due to pain  Transfers                      Balance                                   ADL                                         General ADL Comments: attempted bed mobility:  pt will get up on L side at home.  Assisted with moving towards middle of bed as she had gotten back to bed on the other side of the bed and was close to the edge.  Pt was unable to tolerate rolling over due to pressure on RLE.  Discussed whether son could move bed over so that she could get out on R side of bed:  she thinks this would be possible.  Cramping became worse, and she was unable to roll to R.  Requested pain medication and OT will return later this am.  Prior to bed mobility, discussed whether ADL supplies needed to be moved while son is here for easier retrieval.  Pt will have limited support after Sunday.  She verbalizes all back precautions.      Vision                     Perception     Praxis      Cognition   Behavior During Therapy: WFL for tasks assessed/performed Overall Cognitive Status: Within Functional Limits for tasks assessed                       Extremity/Trunk Assessment               Exercises     Shoulder Instructions       General Comments       Pertinent Vitals/ Pain       Pain Score: 9-10  Pain Location: R foot and thigh Pain Descriptors / Indicators: Cramping;Crying Pain Intervention(s): Limited activity within patient's tolerance;Monitored during session;Premedicated before session;Repositioned;Patient requesting pain meds-RN notified  Home Living                                          Prior Functioning/Environment              Frequency Min 2X/week     Progress Toward Goals  OT Goals(current goals can now be found in the care plan section)  Progress towards  OT goals: Not progressing toward goals - comment (due to pain; will return later this am)     Plan      Co-evaluation                 End of Session     Activity Tolerance Patient limited by pain   Patient Left in bed;with call bell/phone within reach;with bed alarm set   Nurse Communication          Time: 0912-0926 OT Time Calculation (min): 14 min  Charges: OT General Charges $OT Visit: 1 Procedure OT Treatments $Therapeutic Activity: 8-22 mins  Tyresse Jayson 02/21/2015, 10:18 AM Lesle Chris, OTR/L [phone] 02/21/2015

## 2015-02-21 NOTE — Progress Notes (Signed)
Physical Therapy Treatment Patient Details Name: Tiffany Hood MRN: [id number] DOB: 03/11/1941 Today's Date: 02/21/2015    History of Present Illness s/p L4-5 decompression    PT Comments    Pt motivated but mobility progression ltd by increased R foot pain.  Pt ambulating on RW this am - states she can attain RW through church.  Follow Up Recommendations  No PT follow up     Equipment Recommendations  None recommended by PT    Recommendations for Other Services       Precautions / Restrictions Precautions Precautions: Back Precaution Booklet Issued: Yes (comment) Precaution Comments: Precautions reviewed Restrictions Weight Bearing Restrictions: No    Mobility  Bed Mobility Overal bed mobility: Needs Assistance Bed Mobility: Sit to Supine;Sit to Sidelying         Sit to sidelying: Supervision;Min guard General bed mobility comments: min cues for sequence and adherence to back precautions  Transfers Overall transfer level: Needs assistance Equipment used: None Transfers: Sit to/from Stand Sit to Stand: Min guard;Supervision         General transfer comment: cues for transition position.  Pt having increased difficulty vs yesterday 2* increased R foot pain  Ambulation/Gait Ambulation/Gait assistance: Min guard;Supervision Ambulation Distance (Feet): 200 Feet Assistive device: Rolling walker (2 wheeled) Gait Pattern/deviations: Step-through pattern;Decreased step length - right;Decreased step length - left;Shuffle;Trunk flexed;Decreased stance time - right;Antalgic Gait velocity: decr   General Gait Details: cues for posture and position from RW   Stairs Stairs: Yes Stairs assistance: Min guard Stair Management: Two rails;Step to pattern;Forwards Number of Stairs: 5 General stair comments: cues for sequence   Wheelchair Mobility    Modified Rankin (Stroke Patients Only)       Balance                                     Cognition Arousal/Alertness: Awake/alert Behavior During Therapy: WFL for tasks assessed/performed Overall Cognitive Status: Within Functional Limits for tasks assessed                      Exercises      General Comments        Pertinent Vitals/Pain Pain Assessment: 0-10 Pain Score: 6  Pain Location: R LE Pain Descriptors / Indicators: Aching;Spasm Pain Intervention(s): Limited activity within patient's tolerance;Monitored during session;Premedicated before session    Home Living                      Prior Function            PT Goals (current goals can now be found in the care plan section) Acute Rehab PT Goals Patient Stated Goal: decreased pain and return to being independent PT Goal Formulation: With patient Time For Goal Achievement: 02/23/15 Potential to Achieve Goals: Good Progress towards PT goals: Progressing toward goals    Frequency  7X/week    PT Plan Current plan remains appropriate    Co-evaluation             End of Session   Activity Tolerance: Patient tolerated treatment well;Patient limited by pain Patient left: in bed;with call bell/phone within reach     Time: [id number] PT Time Calculation (min) (ACUTE ONLY): 23 min  Charges:  $Gait Training: 8-22 mins $Therapeutic Activity: 8-22 mins  G Codes:      Elvira Langston 2015/02/25, 12:05 PM

## 2015-02-21 NOTE — Care Management Note (Signed)
Case Management Note  Patient Details  Name: CORINTHIAN KOST MRN: [id number] Date of Birth: 1940/07/17  Subjective/Objective:   s/p L4-5 decompression                 Action/Plan: Discharge planning, no HH needs identifieds  Expected Discharge Date:                  Expected Discharge Plan:  Home/Self Care  In-House Referral:  NA  Discharge planning Services  CM Consult  Post Acute Care Choice:  NA Choice offered to:  NA  DME Arranged:  N/A DME Agency:  NA  HH Arranged:  NA HH Agency:  NA  Status of Service:  Completed, signed off  Medicare Important Message Given:    Date Medicare IM Given:    Medicare IM give by:    Date Additional Medicare IM Given:    Additional Medicare Important Message give by:     If discussed at [place] of Stay Meetings, dates discussed:    Additional Comments:  Guadalupe Maple, RN 02/21/2015, 11:11 AM

## 2015-02-21 NOTE — Progress Notes (Signed)
   Subjective: 2 Days Post-Op Procedure(s) (LRB): MICRODISCECTOMY AT L4-L5, CENTRAL DECOMPRESSION L4-L5 FOR SPINAL STENOSIS, FORAMINOTOMY FOR L4 L5 ROOT ON RIGHT (N/A) Patient reports pain as mild.   Patient seen in rounds for Dr. Gladstone Lighter. Patient is well, but has had some minor complaints of pain in the leg and spasms in the leg, requiring pain medications Patient is ready to go home today.  Objective: Vital signs in last 24 hours: Temp:  [96.9 F (36.1 C)-99.4 F (37.4 C)] 98.8 F (37.1 C) (11/25 0548) Pulse Rate:  [74-89] 89 (11/25 0548) Resp:  [14-18] 18 (11/25 0548) BP: (83-129)/(56-62) 128/61 mmHg (11/25 0548) SpO2:  [96 %-100 %] 96 % (11/25 0548)  Intake/Output from previous day:  Intake/Output Summary (Last 24 hours) at 02/21/15 0833 Last data filed at 02/20/15 1900  Gross per 24 hour  Intake    280 ml  Output    650 ml  Net   -370 ml    Labs:  Recent Labs  02/18/15 1130  HGB 12.5    Recent Labs  02/18/15 1130  WBC 7.4  RBC 4.37  HCT 39.3  PLT 209    Recent Labs  02/18/15 1130  NA 139  K 4.1  CL 100*  CO2 31  BUN 27*  CREATININE 0.67  GLUCOSE 99  CALCIUM 9.3    Recent Labs  02/18/15 1130  INR 0.92    EXAM: General - Patient is Alert, Appropriate and Oriented Extremity - Neurologically intact Neurovascular intact Sensation intact distally Dorsiflexion/Plantar flexion intact Dressing - clean, dry, no drainage Motor Function - intact, moving foot and toes well on exam.   Assessment/Plan: 2 Days Post-Op Procedure(s) (LRB): MICRODISCECTOMY AT L4-L5, CENTRAL DECOMPRESSION L4-L5 FOR SPINAL STENOSIS, FORAMINOTOMY FOR L4 L5 ROOT ON RIGHT (N/A) Procedure(s) (LRB): MICRODISCECTOMY AT L4-L5, CENTRAL DECOMPRESSION L4-L5 FOR SPINAL STENOSIS, FORAMINOTOMY FOR L4 L5 ROOT ON RIGHT (N/A) Past Medical History  Diagnosis Date  . Hypertension   . Hyperlipidemia   . Osteoporosis     Fosamax Rxed 2/13  . Breast cancer ([place]) left  . Diverticulitis      09/14/12  . Diverticulosis   . Personal history of colonic adenomas 11/15/2012  . Heart murmur   . Diabetes mellitus     TYPE 2  . Tingling     right leg   . Seasonal allergies   . Pneumonia     2013  . History of urinary tract infection   . History of radiation therapy    Active Problems:   Spinal stenosis, lumbar region, with neurogenic claudication  Estimated body mass index is 29.54 kg/(m^2) as calculated from the following:   Height as of this encounter: 5' 2.5" (1.588 m).   Weight as of this encounter: 74.503 kg (164 lb 4 oz). Up with therapy Discharge home with home health Diet - Cardiac diet and Diabetic diet Follow up - in 2 weeks Activity - WBAT Disposition - Home Condition Upon Discharge - Good D/C Meds - See DC Summary  Arlee Muslim, PA-C Orthopaedic Surgery 02/21/2015, 8:33 AM

## 2015-02-21 NOTE — Progress Notes (Signed)
   02/21/15 1100  OT Visit Information  Last OT Received On 02/21/15  Assistance Needed +1  History of Present Illness s/p L4-5 decompression  OT Time Calculation  OT Start Time (ACUTE ONLY) 1039  OT Stop Time (ACUTE ONLY) 1100  OT Time Calculation (min) 21 min  Precautions  Precautions Back  Pain Assessment  Pain Score 5  Pain Location R LE  Pain Descriptors / Indicators Aching  Pain Intervention(s) Limited activity within patient's tolerance;Monitored during session;Premedicated before session;Repositioned  Cognition  Arousal/Alertness Awake/alert  Behavior During Therapy WFL for tasks assessed/performed  Overall Cognitive Status Within Functional Limits for tasks assessed  ADL  Tub/ Chemical engineer;Ambulation  General ADL Comments practiced retrieving items from closet at supervision level.  Pt with safe technique, following back precautions.  She verbalizes use of reacher from yesterday and plans to get this.  Recommended that she carry cell or cordless phone with her at all times when alone. Son will be with her through Sunday.  Educated him on bed mobility as pt did not feel up to trying again.  Pt does state that she has a neighbor who can assist after Sunday, if needed.  Also reviewed toilet aide, but pt is unsure if she'll get this.  Bed Mobility  General bed mobility comments educated son (demonstrated on him)  Restrictions  Weight Bearing Restrictions No  Transfers  Overall transfer level Needs assistance  Sit to Stand Supervision  General transfer comment needed cues for UE placement  OT - End of Session  Activity Tolerance Patient tolerated treatment well  Patient left in chair;with call bell/phone within reach;with family/visitor present  OT Assessment/Plan  Follow Up Recommendations Supervision/Assistance - 24 hour (then intermittent)  OT Equipment None recommended by OT  OT Goal Progression  Progress towards OT goals Progressing toward goals  OT  General Charges  $OT Visit 1 Procedure  OT Treatments  $Self Care/Home Management  8-22 mins  Lesle Chris, OTR/L [phone] 02/21/2015

## 2015-02-24 NOTE — Discharge Summary (Signed)
Physician Discharge Summary   Patient ID: Tiffany Hood MRN: [id number] DOB/AGE: 12/15/1940 74 y.o.  Admit date: 02/19/2015 Discharge date: 02/21/2015  Primary Diagnosis: Lumbar spinal stenosis   Admission Diagnoses:  Past Medical History  Diagnosis Date  . Hypertension   . Hyperlipidemia   . Osteoporosis     Fosamax Rxed 2/13  . Breast cancer ([place]) left  . Diverticulitis     09/14/12  . Diverticulosis   . Personal history of colonic adenomas 11/15/2012  . Heart murmur   . Diabetes mellitus     TYPE 2  . Tingling     right leg   . Seasonal allergies   . Pneumonia     2013  . History of urinary tract infection   . History of radiation therapy    Discharge Diagnoses:   Active Problems:   Spinal stenosis, lumbar region, with neurogenic claudication  Estimated body mass index is 29.54 kg/(m^2) as calculated from the following:   Height as of this encounter: 5' 2.5" (1.588 m).   Weight as of this encounter: 74.503 kg (164 lb 4 oz).  Procedure:  Procedure(s) (LRB): MICRODISCECTOMY AT L4-L5, CENTRAL DECOMPRESSION L4-L5 FOR SPINAL STENOSIS, FORAMINOTOMY FOR L4 L5 ROOT ON RIGHT (N/A)   Consults: None  HPI: The patient reports lumbosacral area symptoms including pain, numbness, tingling and pain with walking which began 3 weeks ago without any known injury. Symptoms are reported to be located in the right low back and Symptoms include pain, numbness, tingling, weakness and pain in the calf. The pain radiates to the right buttock, right posterior thigh, right lower leg and right lateral lower leg. The patient describes the pain as dull, tingling and throbbing. The symptom onset was sudden. The patient describes the severity of their symptoms as severe. The patient feels as if the symptoms are worsening. Symptoms are exacerbated by standing. Symptoms are relieved by rest and nonsteroidal anti-inflammatory drugs. Current treatment includes nonsteroidal anti-inflammatory drugs and  muscle relaxants. Symptoms present at the patient's previous evaluation included back pain. Past treatment has included nonsteroidal anti-inflammatory drugs and muscle relaxants. She reports weakness and tingling into the rt leg from buttocks to rt lat ankle. MRI shows that she has an extremely large disc at L4-L5 migrating to cephalad. She has a low osteophyte complex distally at 5-1.   Laboratory Data: Admission on 02/19/2015, Discharged on 02/21/2015  Component Date Value Ref Range Status  . Glucose-Capillary 02/19/2015 83  65 - 99 mg/dL Final  . Comment 1 02/19/2015 Notify RN   Final  . Glucose-Capillary 02/19/2015 88  65 - 99 mg/dL Final  . Comment 1 02/19/2015 Notify RN   Final  . Comment 2 02/19/2015 Document in Chart   Final  Hospital Outpatient Visit on 02/18/2015  Component Date Value Ref Range Status  . WBC 02/18/2015 7.4  4.0 - 10.5 K/uL Final  . RBC 02/18/2015 4.37  3.87 - 5.11 MIL/uL Final  . Hemoglobin 02/18/2015 12.5  12.0 - 15.0 g/dL Final  . HCT 02/18/2015 39.3  36.0 - 46.0 % Final  . MCV 02/18/2015 89.9  78.0 - 100.0 fL Final  . MCH 02/18/2015 28.6  26.0 - 34.0 pg Final  . MCHC 02/18/2015 31.8  30.0 - 36.0 g/dL Final  . RDW 02/18/2015 15.0  11.5 - 15.5 % Final  . Platelets 02/18/2015 209  150 - 400 K/uL Final  . Neutrophils Relative % 02/18/2015 58   Final  . Neutro Abs 02/18/2015 4.3  1.7 -  7.7 K/uL Final  . Lymphocytes Relative 02/18/2015 28   Final  . Lymphs Abs 02/18/2015 2.1  0.7 - 4.0 K/uL Final  . Monocytes Relative 02/18/2015 9   Final  . Monocytes Absolute 02/18/2015 0.6  0.1 - 1.0 K/uL Final  . Eosinophils Relative 02/18/2015 5   Final  . Eosinophils Absolute 02/18/2015 0.4  0.0 - 0.7 K/uL Final  . Basophils Relative 02/18/2015 0   Final  . Basophils Absolute 02/18/2015 0.0  0.0 - 0.1 K/uL Final  . Sodium 02/18/2015 139  135 - 145 mmol/L Final  . Potassium 02/18/2015 4.1  3.5 - 5.1 mmol/L Final  . Chloride 02/18/2015 100* 101 - 111 mmol/L Final  . CO2  02/18/2015 31  22 - 32 mmol/L Final  . Glucose, Bld 02/18/2015 99  65 - 99 mg/dL Final  . BUN 02/18/2015 27* 6 - 20 mg/dL Final  . Creatinine, Ser 02/18/2015 0.67  0.44 - 1.00 mg/dL Final  . Calcium 02/18/2015 9.3  8.9 - 10.3 mg/dL Final  . Total Protein 02/18/2015 6.5  6.5 - 8.1 g/dL Final  . Albumin 02/18/2015 3.6  3.5 - 5.0 g/dL Final  . AST 02/18/2015 23  15 - 41 U/L Final  . ALT 02/18/2015 20  14 - 54 U/L Final  . Alkaline Phosphatase 02/18/2015 54  38 - 126 U/L Final  . Total Bilirubin 02/18/2015 0.5  0.3 - 1.2 mg/dL Final  . GFR calc non Af Amer 02/18/2015 >60  >60 mL/min Final  . GFR calc Af Amer 02/18/2015 >60  >60 mL/min Final   Comment: (NOTE) The eGFR has been calculated using the CKD EPI equation. This calculation has not been validated in all clinical situations. eGFR's persistently <60 mL/min signify possible Chronic Kidney Disease.   . Anion gap 02/18/2015 8  5 - 15 Final  . Prothrombin Time 02/18/2015 12.6  11.6 - 15.2 seconds Final  . INR 02/18/2015 0.92  0.00 - 1.49 Final  . ABO/RH(D) 02/18/2015 O POS   Final  . Antibody Screen 02/18/2015 NEG   Final  . Sample Expiration 02/18/2015 02/22/2015   Final  . Extend sample reason 02/18/2015 NO TRANSFUSIONS OR PREGNANCY IN THE PAST 3 MONTHS   Final  . Hgb A1c MFr Bld 02/18/2015 6.6* 4.8 - 5.6 % Final   Comment: (NOTE)         Pre-diabetes: 5.7 - 6.4         Diabetes: >6.4         Glycemic control for adults with diabetes: <7.0   . Mean Plasma Glucose 02/18/2015 143   Final   Comment: (NOTE) Performed At: [facility] ([place] [place], [place] [id number] Lindon Romp MD SP:[id number]   . MRSA, PCR 02/18/2015 NEGATIVE  NEGATIVE Final  . Staphylococcus aureus 02/18/2015 NEGATIVE  NEGATIVE Final   Comment:        The Xpert SA Assay (FDA approved for NASAL specimens in patients over 50 years of age), is one component of a comprehensive surveillance program.  Test performance has been validated  by [facility] for patients greater than or equal to 99 year old. It is not intended to diagnose infection nor to guide or monitor treatment.   Marland Kitchen aPTT 02/18/2015 25  24 - 37 seconds Final  . ABO/RH(D) 02/18/2015 O POS   Final  Appointment on 01/30/2015  Component Date Value Ref Range Status  . WBC 01/30/2015 9.4  3.9 - 10.3 10e3/uL Final  . NEUT# 01/30/2015  6.6* 1.5 - 6.5 10e3/uL Final  . HGB 01/30/2015 13.8  11.6 - 15.9 g/dL Final  . HCT 01/30/2015 41.1  34.8 - 46.6 % Final  . Platelets 01/30/2015 238  145 - 400 10e3/uL Final  . MCV 01/30/2015 85.6  79.5 - 101.0 fL Final  . MCH 01/30/2015 28.8  25.1 - 34.0 pg Final  . MCHC 01/30/2015 33.6  31.5 - 36.0 g/dL Final  . RBC 01/30/2015 4.80  3.70 - 5.45 10e6/uL Final  . RDW 01/30/2015 14.7* 11.2 - 14.5 % Final  . lymph# 01/30/2015 1.7  0.9 - 3.3 10e3/uL Final  . MONO# 01/30/2015 0.7  0.1 - 0.9 10e3/uL Final  . Eosinophils Absolute 01/30/2015 0.3  0.0 - 0.5 10e3/uL Final  . Basophils Absolute 01/30/2015 0.1  0.0 - 0.1 10e3/uL Final  . NEUT% 01/30/2015 70.4  38.4 - 76.8 % Final  . LYMPH% 01/30/2015 18.5  14.0 - 49.7 % Final  . MONO% 01/30/2015 7.3  0.0 - 14.0 % Final  . EOS% 01/30/2015 3.0  0.0 - 7.0 % Final  . BASO% 01/30/2015 0.8  0.0 - 2.0 % Final  . Sodium 01/30/2015 139  136 - 145 mEq/L Final  . Potassium 01/30/2015 3.7  3.5 - 5.1 mEq/L Final  . Chloride 01/30/2015 103  98 - 109 mEq/L Final  . CO2 01/30/2015 27  22 - 29 mEq/L Final  . Glucose 01/30/2015 113  70 - 140 mg/dl Final   Glucose reference range is for nonfasting patients. Fasting glucose reference range is 70- 100.  Marland Kitchen BUN 01/30/2015 29.0* 7.0 - 26.0 mg/dL Final  . Creatinine 01/30/2015 0.8  0.6 - 1.1 mg/dL Final  . Total Bilirubin 01/30/2015 0.45  0.20 - 1.20 mg/dL Final  . Alkaline Phosphatase 01/30/2015 75  40 - 150 U/L Final  . AST 01/30/2015 15  5 - 34 U/L Final  . ALT 01/30/2015 15  0 - 55 U/L Final  . Total Protein 01/30/2015 7.1  6.4 - 8.3 g/dL Final  .  Albumin 01/30/2015 3.8  3.5 - 5.0 g/dL Final  . Calcium 01/30/2015 10.1  8.4 - 10.4 mg/dL Final  . Anion Gap 01/30/2015 10  3 - 11 mEq/L Final  . EGFR 01/30/2015 78* >90 ml/min/1.73 m2 Final   eGFR is calculated using the CKD-EPI Creatinine Equation (2009)     X-Rays:Dg Chest 2 View  02/18/2015  CLINICAL DATA:  Preop evaluation for lumbar decompression laminectomy. History of breast cancer. Preoperative clearance. EXAM: CHEST  2 VIEW COMPARISON:  04/05/2013 FINDINGS: The heart size and mediastinal contours are within normal limits. Both lungs are clear. Stable mild degenerative changes of the lower thoracic and upper lumbar spine. Postop changes over the left upper breast and axilla. Trachea is midline. Atherosclerosis of aorta. IMPRESSION: Chronic and postoperative findings as above. No significant interval change or new process. Electronically Signed   By: Jerilynn Mages.  Shick M.D.   On: 02/18/2015 13:51   Dg Lumbar Spine 2-3 Views  02/19/2015  ADDENDUM REPORT: 02/19/2015 08:45 ADDENDUM: Disc space loss is most prominent L1-L2, L3-L4, and L5-S1. Electronically Signed   By: Marcello Moores  Register   On: 02/19/2015 08:45  02/19/2015  CLINICAL DATA:  Decompressive laminectomy. EXAM: LUMBAR SPINE - 2-3 VIEW COMPARISON:  CT 04/25/2013. FINDINGS: Paraspinal soft tissues are normal. Diffuse degenerative change lumbar spine and both hips. No acute abnormality identified. IMPRESSION: Diffuse degenerative change lumbar spine.  No acute abnormality . Electronically Signed: ByMarcello Moores  Register On: 02/18/2015 13:51   Dg  Ankle 2 Views Right  01/31/2015  CLINICAL DATA:  Lateral ankle pain for 3 days EXAM: RIGHT ANKLE - 2 VIEW COMPARISON:  None. FINDINGS: Two views of the right ankle submitted. No acute fracture or subluxation. Ankle mortise is preserved. There is plantar spur of calcaneus. IMPRESSION: No acute fracture or subluxation.  Plantar spur of calcaneus. Electronically Signed   By: Lahoma Crocker M.D.   On: 01/31/2015 15:48    Dg Spine Portable 1 View  02/19/2015  CLINICAL DATA:  Intraoperative localization for lumbar surgery EXAM: PORTABLE SPINE - 1 VIEW COMPARISON:  Film from earlier in the same day FINDINGS: The numbering nomenclature is similar to that utilized on the earlier film. A surgical instrument is now seen posterior to the L4 vertebral body within the spinal canal. Surgical retractors are noted as well. IMPRESSION: Intraoperative localization at L4 as described. Electronically Signed   By: Inez Catalina M.D.   On: 02/19/2015 14:47   Dg Spine Portable 1 View  02/19/2015  CLINICAL DATA:  Intraoperative lumbar surgery EXAM: PORTABLE SPINE - 1 VIEW COMPARISON:  Study obtained earlier in the day FINDINGS: Cross-table lateral image is labeled #2. Metallic probe tips are posterior to the L4-5 interspace and L5-S1 interspace levels. No fracture or spondylolisthesis. Multilevel arthropathy is stable. IMPRESSION: Metallic probe tips are posterior to the L4-5 and L5-S1 interspace levels respectively. Extensive lumbar arthropathy. No fracture or spondylolisthesis. Electronically Signed   By: Lowella Grip III M.D.   On: 02/19/2015 14:28   Dg Spine Portable 1 View  02/19/2015  CLINICAL DATA:  Intraoperative lumbar surgery EXAM: PORTABLE SPINE - 1 VIEW COMPARISON:  February 18, 2015 FINDINGS: Cross-table lateral lumbar image is labeled #1. There is a metallic probe with the tip posterior to the L5 interspace level. A second metallic probe has its tip posterior to the S1 vertebral body level. There is moderately severe disc space narrowing at L1-2, L3-4, and L5-S1. There is moderate narrowing at other levels. No fracture or spondylolisthesis. IMPRESSION: Metallic probe tips are posterior to the L5-S1 interspace level and posterior to the S1 vertebral body level respectively. Extensive arthropathy. No fracture or spondylolisthesis. Electronically Signed   By: Lowella Grip III M.D.   On: 02/19/2015 14:14   Dg Foot  Complete Right  01/31/2015  CLINICAL DATA:  Lateral ankle pain for 3 days EXAM: RIGHT FOOT COMPLETE - 3+ VIEW COMPARISON:  None. FINDINGS: Three views of the right foot submitted. No acute fracture or subluxation. No radiopaque foreign body. There is plantar spur of calcaneus. IMPRESSION: No acute fracture or subluxation.  Plantar spur of calcaneus. Electronically Signed   By: Lahoma Crocker M.D.   On: 01/31/2015 15:47    EKG: Orders placed or performed during the hospital encounter of 02/18/15  . EKG  . EKG     Hospital Course: ARWILDA GEORGIA is a 74 y.o. who was admitted to [facility]. They were brought to the operating room on 02/19/2015 and underwent Procedure(s): MICRODISCECTOMY AT L4-L5, CENTRAL DECOMPRESSION L4-L5 FOR SPINAL STENOSIS, FORAMINOTOMY FOR L4 L5 ROOT ON RIGHT.  Patient tolerated the procedure well and was later transferred to the recovery room and then to the orthopaedic floor for postoperative care.  They were given PO and IV analgesics for pain control following their surgery.  They were given 24 hours of postoperative antibiotics of  Anti-infectives    Start     Dose/Rate Route Frequency Ordered Stop   02/19/15 2200  ceFAZolin (ANCEF) IVPB 1  g/50 mL premix     1 g 100 mL/hr over 30 Minutes Intravenous 3 times per day 02/19/15 1713 02/20/15 1430   02/19/15 1413  polymyxin B 500,000 Units, bacitracin 50,000 Units in sodium chloride irrigation 0.9 % 500 mL irrigation  Status:  Discontinued       As needed 02/19/15 1413 02/19/15 1605   02/19/15 1058  ceFAZolin (ANCEF) IVPB 2 g/50 mL premix     2 g 100 mL/hr over 30 Minutes Intravenous On call to O.R. 02/19/15 1058 02/19/15 1343     and started on DVT prophylaxis in the form of Aspirin.   PT was ordered. Discharge planning consulted to help with postop disposition and equipment needs.  Patient had a fair night on the evening of surgery.  They started to get up OOB with therapy on day one.  Continued to work with  therapy into day two.  Dressing was changed on day two and the incision was clean and dry.  The patient had progressed with therapy and meeting their goals.  Incision was healing well.  Patient was seen in rounds and was ready to go home.   Diet: Cardiac diet Activity:WBAT Follow-up:in 2 weeks Disposition - Home Discharged Condition: stable   Discharge Instructions    Call MD / Call 911    Complete by:  As directed   If you experience chest pain or shortness of breath, CALL 911 and be transported to the hospital emergency room.  If you develope a fever above 101 F, pus (white drainage) or increased drainage or redness at the wound, or calf pain, call your surgeon's office.     Constipation Prevention    Complete by:  As directed   Drink plenty of fluids.  Prune juice may be helpful.  You may use a stool softener, such as Colace (over the counter) 100 mg twice a day.  Use MiraLax (over the counter) for constipation as needed.     Diet - low sodium heart healthy    Complete by:  As directed      Discharge instructions    Complete by:  As directed   For the first three days, remove your dressing, tape a piece of saran wrap over your incision. Take your shower, then remove the saran wrap and put a clean dressing on. After three days you can shower without the saran wrap.  Call Dr. Gladstone Lighter if any wound complications or temperature of 101 degrees F or over.  Take aspirin 344m daily to prevent blood clots. Call the office for an appointment to see Dr. GGladstone Lighterin two weeks: [phone] ask for Dr. GCharlestine Nightnurse, TBrunilda Payor     Increase activity slowly as tolerated    Complete by:  As directed             Medication List    STOP taking these medications        aspirin 81 MG tablet     traMADol 50 MG tablet  Commonly known as:  ULTRAM      TAKE these medications        CALCIUM MAGNESIUM PO  Take 0.5 tablets by mouth daily.     CALTRATE 600+D PO  Take 1 capsule by  mouth daily.     Fish Oil 1000 MG Caps  Take 1,000 mg by mouth daily.     losartan 50 MG tablet  Commonly known as:  COZAAR  Take 1 tablet (50 mg total) by mouth daily.  methocarbamol 500 MG tablet  Commonly known as:  ROBAXIN  Take 1 tablet (500 mg total) by mouth every 6 (six) hours as needed for muscle spasms.     multivitamin tablet  Take 1 tablet by mouth daily.     oxyCODONE-acetaminophen 5-325 MG tablet  Commonly known as:  PERCOCET/ROXICET  Take 1-2 tablets by mouth every 4 (four) hours as needed for moderate pain.     pravastatin 40 MG tablet  Commonly known as:  PRAVACHOL  Take 1 tablet (40 mg total) by mouth daily.     SYSTANE 0.4-0.3 % Soln  Generic drug:  Polyethyl Glycol-Propyl Glycol  Place 1 drop into both eyes every morning.     triamterene-hydrochlorothiazide 75-50 MG tablet  Commonly known as:  MAXZIDE  Take 0.5 tablets by mouth daily.     vitamin E 400 UNIT capsule  Take 400 Units by mouth daily.           Follow-up Information    Follow up with GIOFFRE,RONALD A, MD. Schedule an appointment as soon as possible for a visit in 2 weeks.   Specialty:  Orthopedic Surgery   Contact information:   [address] [place] [address] [phone]       Signed: Ardeen Jourdain, PA-C Orthopaedic Surgery 02/24/2015, 8:47 AM

## 2015-03-06 DIAGNOSIS — M25571 Pain in right ankle and joints of right foot: Secondary | ICD-10-CM | POA: Diagnosis not present

## 2015-03-11 ENCOUNTER — Telehealth: Payer: Self-pay

## 2015-03-11 NOTE — Telephone Encounter (Signed)
Call to the patient and stated she was in the hospital on Wed prior to Thanksgiving or herniated disc that was causing pain to leg and foot; Is slowly resolving; Surgery went well.  Agreed to schedule apt for Feb 7th at 10am for AWV

## 2015-05-05 ENCOUNTER — Other Ambulatory Visit: Payer: Self-pay

## 2015-05-06 ENCOUNTER — Ambulatory Visit (INDEPENDENT_AMBULATORY_CARE_PROVIDER_SITE_OTHER): Payer: Medicare Other

## 2015-05-06 ENCOUNTER — Other Ambulatory Visit: Payer: Self-pay

## 2015-05-06 ENCOUNTER — Other Ambulatory Visit: Payer: Self-pay | Admitting: *Deleted

## 2015-05-06 ENCOUNTER — Telehealth: Payer: Self-pay | Admitting: Hematology and Oncology

## 2015-05-06 ENCOUNTER — Telehealth: Payer: Self-pay | Admitting: Internal Medicine

## 2015-05-06 VITALS — BP 120/70 | Ht 62.5 in | Wt 160.0 lb

## 2015-05-06 DIAGNOSIS — Z1231 Encounter for screening mammogram for malignant neoplasm of breast: Secondary | ICD-10-CM

## 2015-05-06 DIAGNOSIS — Z0000 Encounter for general adult medical examination without abnormal findings: Secondary | ICD-10-CM | POA: Diagnosis not present

## 2015-05-06 DIAGNOSIS — M8580 Other specified disorders of bone density and structure, unspecified site: Secondary | ICD-10-CM

## 2015-05-06 DIAGNOSIS — M810 Age-related osteoporosis without current pathological fracture: Secondary | ICD-10-CM

## 2015-05-06 NOTE — Telephone Encounter (Signed)
Pt called to let us know that she use [facility] for her online pharmacy. Pt stated we were going to send in 3 Rx for 90 days supply for her. Please check

## 2015-05-06 NOTE — Telephone Encounter (Signed)
Patient left message requesting order for bone density. Per patient this is not like annual mammo's and she cannot have appointment scheduled without an order. Patient's message forward to desk nurse and order now in. Returned call and left message for patient - order has been given and she can proceed with contacting [facility] to schedule bone density test.

## 2015-05-06 NOTE — Progress Notes (Addendum)
Subjective:   Tiffany Hood is a 75 y.o. female who presents for Medicare Annual (Subsequent) preventive examination.  Review of Systems:  HRA assessment completed during visit; [place] The Patient was informed that this wellness visit is to identify risk and educate on how to reduce risk for increase disease through lifestyle changes.   ROS deferred to CPE exam with physician  Health is good;  It is not great because she has had sigmoid colon removed. Last year 2 ruptured disc in back in Nov; surgery recovery went well; 23rd November; Dr. Wynetta Emery; still has numbness in right leg but feels this will resolve; not falling but had one fall secondary to vacuuming around bed  Feels strong / hoping for good year,.   Medical issues  HTN; great control; BP very good today; Needs maintenance meds refilled    Osteoporosis;  States she was on alendronate x 10 years; Dexa improving and will hold for now; taking Vit d; approx 1000 u daily and will start walking 3 to 5 days a week  Will repeat dexa this year in July according to recommended guidelines  Pre diabetes;  Average A1c  6.6; educated regarding pre-diabetes and exercise;   Lipids 04/2014 Trig 251; LDL 80; HDL 56 in 2015  BMI: 28  Diet; not a big meat person;  Does like vegetables and fruits; eats 2 to 3 serving with fruit; Some salty meats; discussed eating lean meat as bison and low sodium hot dogs;    Exercise; was walking prior to surgery; has hills going and coming;  Was walking 1 to 2 miles prior to having surgery; Had lost 10 lbs prior to back surgery in Nov.   SAFETY/ live alone; one level  Safety reviewed for the home; may move where son is near [employer]; has a shower in tub and other has full size with bars and handicapped accessible.  Community safety; yes  Smoke detectors: yes Alarm system yes  Firearms safety / reviewed and discussed  Driving accidents and seatbelt/ no  Sun protection/  yes Stressors; no stress; except recovery  Medication review/ New meds Needs refills  Fall assessment / fell x 1 in January; vacuuming room fell around bed;  Gait assessment  Mobilization and Functional losses in the last year. 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 Levester Fresh 08/2013: hx left breast cancer 2004; s/p lumpectomy and radiation; Stayed on arimidex;   Alendronate; was on this x 10 year but dc last year as dexa showed some improvement; Taking Calcium and vit d  [facility]; scheduled for [facility]  Will have mammogram at the same time; Ordered dexa today so the patient can schedule exam after June of this year.    Ophthalmology exam; 01/2014/ had one this year; 12th of January 2017  Dr. Shirley Muscat  Immunizations are caught up  Current Care Team reviewed and updated  Cardiac Risk Factors include: advanced age (>57men, >22 women);hypertension     Objective:     Vitals: BP 120/70 mmHg  Ht 5' 2.5" (1.588 m)  Wt 160 lb (72.576 kg)  BMI 28.78 kg/m2  Tobacco History  Smoking status  . Never Smoker   Smokeless tobacco  . Never Used     Counseling given: Yes   Past Medical History  Diagnosis Date  . Hypertension   . Hyperlipidemia   . Osteoporosis     Fosamax Rxed 2/13  . Breast cancer ([facility]) left  . Diverticulitis     09/14/12  . Diverticulosis   . Personal history of colonic adenomas 11/15/2012  . Heart murmur   . Diabetes mellitus     TYPE 2  . Tingling     right  leg   . Seasonal allergies   . Pneumonia     2013  . History of urinary tract infection   . History of radiation therapy    Past Surgical History  Procedure Laterality Date  . Abdominal hysterectomy  1965    ovaries remain; for dysfunctional menses; ? endometriosis  . Tonsillectomy and adenoidectomy    . Colonoscopy  2004     Tics  . Removal bone spur Left 2/14    2nd finger  . Colon polyps  2014    Dr Carlean Purl  . Breast lumpectomy Left   . Laparoscopic sigmoid colectomy  04/13/2013    DR TOTH  . Abdominal surgery    . Laparoscopic sigmoid colectomy N/A 04/13/2013    Procedure: LAPAROSCOPIC ASSISTED SIGMOID COLECTOMY;  Surgeon: Merrie Roof, MD;  Location: [facility];  Service: General;  Laterality: N/A;  . Dilation and curettage of uterus    . Lumbar laminectomy/decompression microdiscectomy N/A 02/19/2015    Procedure: MICRODISCECTOMY AT L4-L5, CENTRAL DECOMPRESSION L4-L5 FOR SPINAL STENOSIS, FORAMINOTOMY FOR L4 L5 ROOT ON RIGHT;  Surgeon: Latanya Maudlin, MD;  Location: [facility];  Service: Orthopedics;  Laterality: N/A;   Family History  Problem Relation Age of Onset  . Stroke Mother 56  . Hypertension Mother   . Lung cancer Father     smoker  . Hypertension Sister   . Hypertension Brother   . Stroke Brother 60  . Heart disease Brother 99  . Diabetes Neg Hx   . Colon cancer Neg Hx    History  Sexual Activity  . Sexual Activity: Not on file    Outpatient Encounter Prescriptions as of 05/06/2015  Medication Sig  . Calcium Carbonate-Vitamin D (CALTRATE 600+D PO) Take 1 capsule by mouth daily.   Marland Kitchen losartan (COZAAR) 50 MG tablet Take 1 tablet (50 mg total) by mouth daily.  . Multiple Vitamin (MULTIVITAMIN) tablet Take 1 tablet by mouth daily.    . Omega-3 Fatty Acids (FISH OIL) 1000 MG CAPS Take 1,000 mg by mouth daily.   Vladimir Faster Glycol-Propyl Glycol (SYSTANE) 0.4-0.3 % SOLN Place 1 drop into both eyes every morning.  . pravastatin (PRAVACHOL) 40 MG tablet Take 1 tablet  (40 mg total) by mouth daily.  Marland Kitchen triamterene-hydrochlorothiazide (MAXZIDE) 75-50 MG per tablet Take 0.5 tablets by mouth daily.  . vitamin E 400 UNIT capsule Take 400 Units by mouth daily.    . Calcium-Magnesium-Vitamin D (CALCIUM MAGNESIUM PO) Take 0.5 tablets by mouth daily. Reported on 05/06/2015  . methocarbamol (ROBAXIN) 500 MG tablet Take 1 tablet (500 mg total) by mouth every 6 (six) hours as needed for muscle spasms. (Patient not taking: Reported on  05/06/2015)  . oxyCODONE-acetaminophen (PERCOCET/ROXICET) 5-325 MG tablet Take 1-2 tablets by mouth every 4 (four) hours as needed for moderate pain. (Patient not taking: Reported on 05/06/2015)   No facility-administered encounter medications on file as of 05/06/2015.    Activities of Daily Living In your present state of health, do you have any difficulty performing the following activities: 05/06/2015 02/19/2015  Hearing? N N  Vision? N N  Difficulty concentrating or making decisions? N N  Walking or climbing stairs? N Y  Dressing or bathing? N N  Doing errands, shopping? N N  Preparing Food and eating ? N -  Using the Toilet? N -  In the past six months, have you accidently leaked urine? N -  Do you have problems with loss of bowel control? N -  Managing your Medications? N -  Managing your Finances? N -  Housekeeping or managing your Housekeeping? N -    Patient Care Team: Hendricks Limes, MD as PCP - General (Internal Medicine) Neldon Mc, MD as Surgeon (General Surgery) Eston Esters, MD as Consulting Physician (Hematology and Oncology)    Assessment:    Assessment   Patient presents for yearly preventative medicine examination. Medicare questionnaire screening were completed, i.e. Functional; fall risk; depression, memory loss and hearing were reviewed; The patient had one fall but no injury; States she is very careful and fall was related to tripping over vacuum near bed;   All immunizations and health maintenance  protocols were reviewed with the patient and are up to date at present   Education provided for laboratory screens;  Discussed the need for repeat labs on an annual basis; needs refills of Losartan Potassium 50mg ; Prvastatin 40mg  and Triamterene - HCTZ   Medication reconciliation, past medical history, social history, problem list and allergies were reviewed in detail with the patient  Goals were established with regard to  exercise, and diet in lieu of pre-diabetic numbers; referred to [email or web address].org for more information.   End of life planning was discussed; given information for [facility] to assist with questions and to complete.    Exercise Activities and Dietary recommendations Current Exercise Habits:: Home exercise routine, Type of exercise: walking, Time (Minutes): 30, Frequency (Times/Week): 3, Weekly Exercise (Minutes/Week): 90, Intensity: Moderate (walking a few hills)  Goals    . Exercise 150 minutes per week (moderate activity)     Exercise at least 30 minutes walk x 5 days a week;        Fall Risk Fall Risk  05/06/2015 05/09/2014 03/30/2013  Falls in the past year? Yes No No  Number falls in past yr: 1 - -  Injury with Fall? No - -  Follow up Education provided - -   Depression Screen PHQ 2/9 Scores 05/06/2015 05/09/2014 03/30/2013  PHQ - 2 Score 0 0 0  Exception Documentation - Patient refusal -     Cognitive Testing No flowsheet data found.  Ad8 score 0   Immunization History  Administered Date(s) Administered  . Influenza Whole 02/06/2007, 12/25/2007  . Influenza, High Dose Seasonal PF 01/02/2013, 01/02/2014  . Influenza-Unspecified 12/26/2014  . Pneumococcal Conjugate-13 09/26/2014  . Pneumococcal Polysaccharide-23 03/30/2013  . Td 03/28/2013  . Zoster 12/09/2011   Screening Tests Health Maintenance  Topic Date Due  . OPHTHALMOLOGY EXAM  02/06/2015  . FOOT EXAM  05/10/2015  . HEMOGLOBIN A1C  08/18/2015  . INFLUENZA VACCINE  10/28/2015  .  MAMMOGRAM  09/08/2016  . COLONOSCOPY  11/10/2017  . TETANUS/TDAP  03/29/2023  . DEXA SCAN  Completed  . ZOSTAVAX  Completed  . PNA vac Low Risk Adult  Completed      Plan:   To make apt to see Dr. Quay Burow in May when repeat labs are due;  During the course of the visit the patient was educated and counseled about the following appropriate screening and preventive services:   Vaccines to include Pneumoccal, Influenza, Hepatitis B, Td, Zostavax, HCV  Electrocardiogram: 01/2015  Cardiovascular Disease/BP under good control; family hx + for stroke and HTN   Colorectal cancer screening/ 07/2012; to repeat 07/2017 due to family hx colon cancer  Bone density screening; to repeat this year after June  Diabetes screening/ 6.6; repeat labs due this year; will make a new patient apt with Dr. Quay Burow  Glaucoma screening; + for glaucoma / controlled with treatment  Mammography/PAP/ repeat this year after June with dexa scan;   Hx of lumpectomy (left)  with radiation;   Nutrition counseling / discussed decreasing sugar or losing approx 8 lbs which would assist with pre-diabetes management; discussed cutting out a carb etc;   Patient Instructions (the written plan) was given to the patient.    To make "new patient" apt with Dr. Quay Burow to review meds and repeat blood work when labs are due in May  To refill maintenance meds Losartan; Pravastatin; and Triameterene- HCTZ per policy. Awaiting at closure for the patient to call with mail order pharmacy to confirm where she would like scripts sent.    [id number], RN  05/06/2015     Medical screening examination/treatment/procedure(s) were performed by non-physician practitioner and as supervising physician I was immediately available for consultation/collaboration. I agree with above. Binnie Rail, MD

## 2015-05-06 NOTE — Patient Instructions (Addendum)
Tiffany Hood , Thank you for taking time to come for your Medicare Wellness Visit. I appreciate your ongoing commitment to your health goals. Please review the following plan we discussed and let me know if I can assist you in the future.   Will schedule dexa scan for after June with mammogram this year  Will make an apt with Dr. Quay Burow after May 11th so Dr. Quay Burow can catch labs up etc.  Call [phone] and ask for Favor Hackler with mail order pharmacy and will refill meds maintenance meds today;  Advanced directive [place] offers free advance directive forms, as well as assistance in completing the forms themselves. For assistance, contact the Spiritual Care Department at [phone], or the Clinical Social Work Department at [phone].     These are the goals we discussed: Goals    . Exercise 150 minutes per week (moderate activity)     Exercise at least 30 minutes walk x 5 days a week;            This is a list of the screening recommended for you and due dates:  Health Maintenance  Topic Date Due  . Eye exam for diabetics  02/06/2015  . Complete foot exam   05/10/2015  . Hemoglobin A1C  08/18/2015  . Flu Shot  10/28/2015  . Mammogram  09/08/2016  . Colon Cancer Screening  11/10/2017  . Tetanus Vaccine  03/29/2023  . DEXA scan (bone density measurement)  Completed  . Shingles Vaccine  Completed  . Pneumonia vaccines  Completed       Fall Prevention in the Home  Falls can cause injuries. They can happen to people of all ages. There are many things you can do to make your home safe and to help prevent falls.  WHAT CAN I DO ON THE OUTSIDE OF MY HOME?  Regularly fix the edges of walkways and driveways and fix any cracks.  Remove anything that might make you trip as you walk through a door, such as a raised step or threshold.  Trim any bushes or trees on the path to your home.  Use bright outdoor lighting.  Clear any walking paths of anything that might make  someone trip, such as rocks or tools.  Regularly check to see if handrails are loose or broken. Make sure that both sides of any steps have handrails.  Any raised decks and porches should have guardrails on the edges.  Have any leaves, snow, or ice cleared regularly.  Use sand or salt on walking paths during winter.  Clean up any spills in your garage right away. This includes oil or grease spills. WHAT CAN I DO IN THE BATHROOM?   Use night lights.  Install grab bars by the toilet and in the tub and shower. Do not use towel bars as grab bars.  Use non-skid mats or decals in the tub or shower.  If you need to sit down in the shower, use a plastic, non-slip stool.  Keep the floor dry. Clean up any water that spills on the floor as soon as it happens.  Remove soap buildup in the tub or shower regularly.  Attach bath mats securely with double-sided non-slip rug tape.  Do not have throw rugs and other things on the floor that can make you trip. WHAT CAN I DO IN THE BEDROOM?  Use night lights.  Make sure that you have a light by your bed that is easy to reach.  Do not use any  sheets or blankets that are too big for your bed. They should not hang down onto the floor.  Have a firm chair that has side arms. You can use this for support while you get dressed.  Do not have throw rugs and other things on the floor that can make you trip. WHAT CAN I DO IN THE KITCHEN?  Clean up any spills right away.  Avoid walking on wet floors.  Keep items that you use a lot in easy-to-reach places.  If you need to reach something above you, use a strong step stool that has a grab bar.  Keep electrical cords out of the way.  Do not use floor polish or wax that makes floors slippery. If you must use wax, use non-skid floor wax.  Do not have throw rugs and other things on the floor that can make you trip. WHAT CAN I DO WITH MY STAIRS?  Do not leave any items on the stairs.  Make sure that  there are handrails on both sides of the stairs and use them. Fix handrails that are broken or loose. Make sure that handrails are as long as the stairways.  Check any carpeting to make sure that it is firmly attached to the stairs. Fix any carpet that is loose or worn.  Avoid having throw rugs at the top or bottom of the stairs. If you do have throw rugs, attach them to the floor with carpet tape.  Make sure that you have a light switch at the top of the stairs and the bottom of the stairs. If you do not have them, ask someone to add them for you. WHAT ELSE CAN I DO TO HELP PREVENT FALLS?  Wear shoes that:  Do not have high heels.  Have rubber bottoms.  Are comfortable and fit you well.  Are closed at the toe. Do not wear sandals.  If you use a stepladder:  Make sure that it is fully opened. Do not climb a closed stepladder.  Make sure that both sides of the stepladder are locked into place.  Ask someone to hold it for you, if possible.  Clearly mark and make sure that you can see:  Any grab bars or handrails.  First and last steps.  Where the edge of each step is.  Use tools that help you move around (mobility aids) if they are needed. These include:  Canes.  Walkers.  Scooters.  Crutches.  Turn on the lights when you go into a dark area. Replace any light bulbs as soon as they burn out.  Set up your furniture so you have a clear path. Avoid moving your furniture around.  If any of your floors are uneven, fix them.  If there are any pets around you, be aware of where they are.  Review your medicines with your doctor. Some medicines can make you feel dizzy. This can increase your chance of falling. Ask your doctor what other things that you can do to help prevent falls.   This information is not intended to replace advice given to you by your health care provider. Make sure you discuss any questions you have with your health care provider.   Document  Released: 01/09/2009 Document Revised: 07/30/2014 Document Reviewed: 04/19/2014 Elsevier Interactive Patient Education 2016 [place] Maintenance, Female Adopting a healthy lifestyle and getting preventive care can go a long way to promote health and wellness. Talk with your health care provider about what schedule of regular  examinations is right for you. This is a good chance for you to check in with your provider about disease prevention and staying healthy. In between checkups, there are plenty of things you can do on your own. Experts have done a lot of research about which lifestyle changes and preventive measures are most likely to keep you healthy. Ask your health care provider for more information. WEIGHT AND DIET  Eat a healthy diet  Be sure to include plenty of vegetables, fruits, low-fat dairy products, and lean protein.  Do not eat a lot of foods high in solid fats, added sugars, or salt.  Get regular exercise. This is one of the most important things you can do for your health.  Most adults should exercise for at least 150 minutes each week. The exercise should increase your heart rate and make you sweat (moderate-intensity exercise).  Most adults should also do strengthening exercises at least twice a week. This is in addition to the moderate-intensity exercise.  Maintain a healthy weight  Body mass index (BMI) is a measurement that can be used to identify possible weight problems. It estimates body fat based on height and weight. Your health care provider can help determine your BMI and help you achieve or maintain a healthy weight.  For females 59 years of age and older:   A BMI below 18.5 is considered underweight.  A BMI of 18.5 to 24.9 is normal.  A BMI of 25 to 29.9 is considered overweight.  A BMI of 30 and above is considered obese.  Watch levels of cholesterol and blood lipids  You should start having your blood tested for lipids and  cholesterol at 74 years of age, then have this test every 5 years.  You may need to have your cholesterol levels checked more often if:  Your lipid or cholesterol levels are high.  You are older than 74 years of age.  You are at high risk for heart disease.  CANCER SCREENING   Lung Cancer  Lung cancer screening is recommended for adults 39-93 years old who are at high risk for lung cancer because of a history of smoking.  A yearly low-dose CT scan of the lungs is recommended for people who:  Currently smoke.  Have quit within the past 15 years.  Have at least a 30-pack-year history of smoking. A pack year is smoking an average of one pack of cigarettes a day for 1 year.  Yearly screening should continue until it has been 15 years since you quit.  Yearly screening should stop if you develop a health problem that would prevent you from having lung cancer treatment.  Breast Cancer  Practice breast self-awareness. This means understanding how your breasts normally appear and feel.  It also means doing regular breast self-exams. Let your health care provider know about any changes, no matter how small.  If you are in your 20s or 30s, you should have a clinical breast exam (CBE) by a health care provider every 1-3 years as part of a regular health exam.  If you are 75 or older, have a CBE every year. Also consider having a breast X-ray (mammogram) every year.  If you have a family history of breast cancer, talk to your health care provider about genetic screening.  If you are at high risk for breast cancer, talk to your health care provider about having an MRI and a mammogram every year.  Breast cancer gene (BRCA) assessment is recommended for women  who have family members with BRCA-related cancers. BRCA-related cancers include:  Breast.  Ovarian.  Tubal.  Peritoneal cancers.  Results of the assessment will determine the need for genetic counseling and BRCA1 and BRCA2  testing. Cervical Cancer Your health care provider may recommend that you be screened regularly for cancer of the pelvic organs (ovaries, uterus, and vagina). This screening involves a pelvic examination, including checking for microscopic changes to the surface of your cervix (Pap test). You may be encouraged to have this screening done every 3 years, beginning at age 62.  For women ages 44-65, health care providers may recommend pelvic exams and Pap testing every 3 years, or they may recommend the Pap and pelvic exam, combined with testing for human papilloma virus (HPV), every 5 years. Some types of HPV increase your risk of cervical cancer. Testing for HPV may also be done on women of any age with unclear Pap test results.  Other health care providers may not recommend any screening for nonpregnant women who are considered low risk for pelvic cancer and who do not have symptoms. Ask your health care provider if a screening pelvic exam is right for you.  If you have had past treatment for cervical cancer or a condition that could lead to cancer, you need Pap tests and screening for cancer for at least 20 years after your treatment. If Pap tests have been discontinued, your risk factors (such as having a new sexual partner) need to be reassessed to determine if screening should resume. Some women have medical problems that increase the chance of getting cervical cancer. In these cases, your health care provider may recommend more frequent screening and Pap tests. Colorectal Cancer  This type of cancer can be detected and often prevented.  Routine colorectal cancer screening usually begins at 74 years of age and continues through 74 years of age.  Your health care provider may recommend screening at an earlier age if you have risk factors for colon cancer.  Your health care provider may also recommend using home test kits to check for hidden blood in the stool.  A small camera at the end of a  tube can be used to examine your colon directly (sigmoidoscopy or colonoscopy). This is done to check for the earliest forms of colorectal cancer.  Routine screening usually begins at age 38.  Direct examination of the colon should be repeated every 5-10 years through 74 years of age. However, you may need to be screened more often if early forms of precancerous polyps or small growths are found. Skin Cancer  Check your skin from head to toe regularly.  Tell your health care provider about any new moles or changes in moles, especially if there is a change in a mole's shape or color.  Also tell your health care provider if you have a mole that is larger than the size of a pencil eraser.  Always use sunscreen. Apply sunscreen liberally and repeatedly throughout the day.  Protect yourself by wearing long sleeves, pants, a wide-brimmed hat, and sunglasses whenever you are outside. HEART DISEASE, DIABETES, AND HIGH BLOOD PRESSURE   High blood pressure causes heart disease and increases the risk of stroke. High blood pressure is more likely to develop in:  People who have blood pressure in the high end of the normal range (130-139/85-89 mm Hg).  People who are overweight or obese.  People who are African American.  If you are 65-64 years of age, have your blood pressure  checked every 3-5 years. If you are 61 years of age or older, have your blood pressure checked every year. You should have your blood pressure measured twice--once when you are at a hospital or clinic, and once when you are not at a hospital or clinic. Record the average of the two measurements. To check your blood pressure when you are not at a hospital or clinic, you can use:  An automated blood pressure machine at a pharmacy.  A home blood pressure monitor.  If you are between 84 years and 75 years old, ask your health care provider if you should take aspirin to prevent strokes.  Have regular diabetes screenings. This  involves taking a blood sample to check your fasting blood sugar level.  If you are at a normal weight and have a low risk for diabetes, have this test once every three years after 74 years of age.  If you are overweight and have a high risk for diabetes, consider being tested at a younger age or more often. PREVENTING INFECTION  Hepatitis B  If you have a higher risk for hepatitis B, you should be screened for this virus. You are considered at high risk for hepatitis B if:  You were born in a country where hepatitis B is common. Ask your health care provider which countries are considered high risk.  Your parents were born in a high-risk country, and you have not been immunized against hepatitis B (hepatitis B vaccine).  You have HIV or AIDS.  You use needles to inject street drugs.  You live with someone who has hepatitis B.  You have had sex with someone who has hepatitis B.  You get hemodialysis treatment.  You take certain medicines for conditions, including cancer, organ transplantation, and autoimmune conditions. Hepatitis C  Blood testing is recommended for:  Everyone born from [birth year] through 1964.  Anyone with known risk factors for hepatitis C. Sexually transmitted infections (STIs)  You should be screened for sexually transmitted infections (STIs) including gonorrhea and chlamydia if:  You are sexually active and are younger than 74 years of age.  You are older than 74 years of age and your health care provider tells you that you are at risk for this type of infection.  Your sexual activity has changed since you were last screened and you are at an increased risk for chlamydia or gonorrhea. Ask your health care provider if you are at risk.  If you do not have HIV, but are at risk, it may be recommended that you take a prescription medicine daily to prevent HIV infection. This is called pre-exposure prophylaxis (PrEP). You are considered at risk if:  You are  sexually active and do not regularly use condoms or know the HIV status of your partner(s).  You take drugs by injection.  You are sexually active with a partner who has HIV. Talk with your health care provider about whether you are at high risk of being infected with HIV. If you choose to begin PrEP, you should first be tested for HIV. You should then be tested every 3 months for as long as you are taking PrEP.  PREGNANCY   If you are premenopausal and you may become pregnant, ask your health care provider about preconception counseling.  If you may become pregnant, take 400 to 800 micrograms (mcg) of folic acid every day.  If you want to prevent pregnancy, talk to your health care provider about birth control (contraception). OSTEOPOROSIS AND  MENOPAUSE   Osteoporosis is a disease in which the bones lose minerals and strength with aging. This can result in serious bone fractures. Your risk for osteoporosis can be identified using a bone density scan.  If you are 27 years of age or older, or if you are at risk for osteoporosis and fractures, ask your health care provider if you should be screened.  Ask your health care provider whether you should take a calcium or vitamin D supplement to lower your risk for osteoporosis.  Menopause may have certain physical symptoms and risks.  Hormone replacement therapy may reduce some of these symptoms and risks. Talk to your health care provider about whether hormone replacement therapy is right for you.  HOME CARE INSTRUCTIONS   Schedule regular health, dental, and eye exams.  Stay current with your immunizations.   Do not use any tobacco products including cigarettes, chewing tobacco, or electronic cigarettes.  If you are pregnant, do not drink alcohol.  If you are breastfeeding, limit how much and how often you drink alcohol.  Limit alcohol intake to no more than 1 drink per day for nonpregnant women. One drink equals 12 ounces of beer, 5  ounces of wine, or 1 ounces of hard liquor.  Do not use street drugs.  Do not share needles.  Ask your health care provider for help if you need support or information about quitting drugs.  Tell your health care provider if you often feel depressed.  Tell your health care provider if you have ever been abused or do not feel safe at home.   This information is not intended to replace advice given to you by your health care provider. Make sure you discuss any questions you have with your health care provider.   Document Released: 09/28/2010 Document Revised: 04/05/2014 Document Reviewed: 02/14/2013 Elsevier Interactive Patient Education [employer].

## 2015-05-08 ENCOUNTER — Other Ambulatory Visit: Payer: Self-pay

## 2015-05-08 DIAGNOSIS — E785 Hyperlipidemia, unspecified: Secondary | ICD-10-CM

## 2015-05-08 DIAGNOSIS — I10 Essential (primary) hypertension: Secondary | ICD-10-CM

## 2015-05-08 MED ORDER — TRIAMTERENE-HCTZ 75-50 MG PO TABS: 0.5000 | ORAL_TABLET | Freq: Every day | ORAL | Status: DC

## 2015-05-08 MED ORDER — LOSARTAN POTASSIUM 50 MG PO TABS: 50.0000 mg | ORAL_TABLET | Freq: Every day | ORAL | Status: DC

## 2015-05-08 MED ORDER — PRAVASTATIN SODIUM 40 MG PO TABS: 40.0000 mg | ORAL_TABLET | Freq: Every day | ORAL | Status: DC

## 2015-05-08 NOTE — Telephone Encounter (Signed)
Call to the patient this am and confirmed [facility] is her mail order; Ordered x 90 days until apt with Dr. Quay Burow for New patient assessment in May

## 2015-06-26 ENCOUNTER — Other Ambulatory Visit: Payer: Self-pay | Admitting: Internal Medicine

## 2015-08-08 ENCOUNTER — Ambulatory Visit (INDEPENDENT_AMBULATORY_CARE_PROVIDER_SITE_OTHER): Payer: Medicare Other | Admitting: Internal Medicine

## 2015-08-08 ENCOUNTER — Encounter: Payer: Self-pay | Admitting: Internal Medicine

## 2015-08-08 VITALS — BP 126/82 | HR 81 | Temp 98.6°F | Resp 16 | Ht 62.5 in | Wt 164.0 lb

## 2015-08-08 DIAGNOSIS — Z0000 Encounter for general adult medical examination without abnormal findings: Secondary | ICD-10-CM

## 2015-08-08 DIAGNOSIS — E119 Type 2 diabetes mellitus without complications: Secondary | ICD-10-CM | POA: Diagnosis not present

## 2015-08-08 DIAGNOSIS — I10 Essential (primary) hypertension: Secondary | ICD-10-CM

## 2015-08-08 DIAGNOSIS — E1169 Type 2 diabetes mellitus with other specified complication: Secondary | ICD-10-CM | POA: Insufficient documentation

## 2015-08-08 DIAGNOSIS — K5732 Diverticulitis of large intestine without perforation or abscess without bleeding: Secondary | ICD-10-CM

## 2015-08-08 DIAGNOSIS — E785 Hyperlipidemia, unspecified: Secondary | ICD-10-CM

## 2015-08-08 MED ORDER — TRIAMTERENE-HCTZ 75-50 MG PO TABS: ORAL_TABLET | ORAL | Status: DC

## 2015-08-08 MED ORDER — ONETOUCH ULTRASOFT LANCETS MISC: Status: DC

## 2015-08-08 MED ORDER — GLUCOSE BLOOD VI STRP: ORAL_STRIP | Status: DC

## 2015-08-08 MED ORDER — PRAVASTATIN SODIUM 40 MG PO TABS: ORAL_TABLET | ORAL | Status: DC

## 2015-08-08 MED ORDER — LOSARTAN POTASSIUM 50 MG PO TABS: ORAL_TABLET | ORAL | Status: DC

## 2015-08-08 NOTE — Patient Instructions (Addendum)
Test(s) ordered today. Your results will be released to [place] (or called to you) after review, usually within 72hours after test completion. If any changes need to be made, you will be notified at that same time.  All other Health Maintenance issues reviewed.   All recommended immunizations and age-appropriate screenings are up-to-date or discussed.  No immunizations administered today.   Medications reviewed and updated.  No changes recommended at this time.  Your prescription(s) have been submitted to your pharmacy. Please take as directed and contact our office if you believe you are having problem(s) with the medication(s).   Please followup in 6 months  Health Maintenance, Female Adopting a healthy lifestyle and getting preventive care can go a long way to promote health and wellness. Talk with your health care provider about what schedule of regular examinations is right for you. This is a good chance for you to check in with your provider about disease prevention and staying healthy. In between checkups, there are plenty of things you can do on your own. Experts have done a lot of research about which lifestyle changes and preventive measures are most likely to keep you healthy. Ask your health care provider for more information. WEIGHT AND DIET  Eat a healthy diet  Be sure to include plenty of vegetables, fruits, low-fat dairy products, and lean protein.  Do not eat a lot of foods high in solid fats, added sugars, or salt.  Get regular exercise. This is one of the most important things you can do for your health.  Most adults should exercise for at least 150 minutes each week. The exercise should increase your heart rate and make you sweat (moderate-intensity exercise).  Most adults should also do strengthening exercises at least twice a week. This is in addition to the moderate-intensity exercise.  Maintain a healthy weight  Body mass index (BMI) is a measurement that can  be used to identify possible weight problems. It estimates body fat based on height and weight. Your health care provider can help determine your BMI and help you achieve or maintain a healthy weight.  For females 44 years of age and older:   A BMI below 18.5 is considered underweight.  A BMI of 18.5 to 24.9 is normal.  A BMI of 25 to 29.9 is considered overweight.  A BMI of 30 and above is considered obese.  Watch levels of cholesterol and blood lipids  You should start having your blood tested for lipids and cholesterol at 74 years of age, then have this test every 5 years.  You may need to have your cholesterol levels checked more often if:  Your lipid or cholesterol levels are high.  You are older than 74 years of age.  You are at high risk for heart disease.  CANCER SCREENING   Lung Cancer  Lung cancer screening is recommended for adults 46-18 years old who are at high risk for lung cancer because of a history of smoking.  A yearly low-dose CT scan of the lungs is recommended for people who:  Currently smoke.  Have quit within the past 15 years.  Have at least a 30-pack-year history of smoking. A pack year is smoking an average of one pack of cigarettes a day for 1 year.  Yearly screening should continue until it has been 15 years since you quit.  Yearly screening should stop if you develop a health problem that would prevent you from having lung cancer treatment.  Breast Cancer  breast self-awareness. This means understanding how your breasts normally appear and feel.  It also means doing regular breast self-exams. Let your health care provider know about any changes, no matter how small.  If you are in your 20s or 30s, you should have a clinical breast exam (CBE) by a health care provider every 1-3 years as part of a regular health exam.  If you are 40 or older, have a CBE every year. Also consider having a breast X-ray (mammogram) every year.  If  you have a family history of breast cancer, talk to your health care provider about genetic screening.  If you are at high risk for breast cancer, talk to your health care provider about having an MRI and a mammogram every year.  Breast cancer gene (BRCA) assessment is recommended for women who have family members with BRCA-related cancers. BRCA-related cancers include:  Breast.  Ovarian.  Tubal.  Peritoneal cancers.  Results of the assessment will determine the need for genetic counseling and BRCA1 and BRCA2 testing. Cervical Cancer Your health care provider may recommend that you be screened regularly for cancer of the pelvic organs (ovaries, uterus, and vagina). This screening involves a pelvic examination, including checking for microscopic changes to the surface of your cervix (Pap test). You may be encouraged to have this screening done every 3 years, beginning at age 21.  For women ages 30-65, health care providers may recommend pelvic exams and Pap testing every 3 years, or they may recommend the Pap and pelvic exam, combined with testing for human papilloma virus (HPV), every 5 years. Some types of HPV increase your risk of cervical cancer. Testing for HPV may also be done on women of any age with unclear Pap test results.  Other health care providers may not recommend any screening for nonpregnant women who are considered low risk for pelvic cancer and who do not have symptoms. Ask your health care provider if a screening pelvic exam is right for you.  If you have had past treatment for cervical cancer or a condition that could lead to cancer, you need Pap tests and screening for cancer for at least 20 years after your treatment. If Pap tests have been discontinued, your risk factors (such as having a new sexual partner) need to be reassessed to determine if screening should resume. Some women have medical problems that increase the chance of getting cervical cancer. In these cases,  your health care provider may recommend more frequent screening and Pap tests. Colorectal Cancer  This type of cancer can be detected and often prevented.  Routine colorectal cancer screening usually begins at 74 years of age and continues through 75 years of age.  Your health care provider may recommend screening at an earlier age if you have risk factors for colon cancer.  Your health care provider may also recommend using home test kits to check for hidden blood in the stool.  A small camera at the end of a tube can be used to examine your colon directly (sigmoidoscopy or colonoscopy). This is done to check for the earliest forms of colorectal cancer.  Routine screening usually begins at age 50.  Direct examination of the colon should be repeated every 5-10 years through 75 years of age. However, you may need to be screened more often if early forms of precancerous polyps or small growths are found. Skin Cancer  Check your skin from head to toe regularly.  Tell your health care provider about any new   moles or changes in moles, especially if there is a change in a mole's shape or color.  Also tell your health care provider if you have a mole that is larger than the size of a pencil eraser.  Always use sunscreen. Apply sunscreen liberally and repeatedly throughout the day.  Protect yourself by wearing long sleeves, pants, a wide-brimmed hat, and sunglasses whenever you are outside. HEART DISEASE, DIABETES, AND HIGH BLOOD PRESSURE   High blood pressure causes heart disease and increases the risk of stroke. High blood pressure is more likely to develop in:  People who have blood pressure in the high end of the normal range (130-139/85-89 mm Hg).  People who are overweight or obese.  People who are African American.  If you are 18-39 years of age, have your blood pressure checked every 3-5 years. If you are 40 years of age or older, have your blood pressure checked every year. You  should have your blood pressure measured twice--once when you are at a hospital or clinic, and once when you are not at a hospital or clinic. Record the average of the two measurements. To check your blood pressure when you are not at a hospital or clinic, you can use:  An automated blood pressure machine at a pharmacy.  A home blood pressure monitor.  If you are between 55 years and 79 years old, ask your health care provider if you should take aspirin to prevent strokes.  Have regular diabetes screenings. This involves taking a blood sample to check your fasting blood sugar level.  If you are at a normal weight and have a low risk for diabetes, have this test once every three years after 74 years of age.  If you are overweight and have a high risk for diabetes, consider being tested at a younger age or more often. PREVENTING INFECTION  Hepatitis B  If you have a higher risk for hepatitis B, you should be screened for this virus. You are considered at high risk for hepatitis B if:  You were born in a country where hepatitis B is common. Ask your health care provider which countries are considered high risk.  Your parents were born in a high-risk country, and you have not been immunized against hepatitis B (hepatitis B vaccine).  You have HIV or AIDS.  You use needles to inject street drugs.  You live with someone who has hepatitis B.  You have had sex with someone who has hepatitis B.  You get hemodialysis treatment.  You take certain medicines for conditions, including cancer, organ transplantation, and autoimmune conditions. Hepatitis C  Blood testing is recommended for:  Everyone born from 1945 through 1965.  Anyone with known risk factors for hepatitis C. Sexually transmitted infections (STIs)  You should be screened for sexually transmitted infections (STIs) including gonorrhea and chlamydia if:  You are sexually active and are younger than 74 years of age.  You  are older than 74 years of age and your health care provider tells you that you are at risk for this type of infection.  Your sexual activity has changed since you were last screened and you are at an increased risk for chlamydia or gonorrhea. Ask your health care provider if you are at risk.  If you do not have HIV, but are at risk, it may be recommended that you take a prescription medicine daily to prevent HIV infection. This is called pre-exposure prophylaxis (PrEP). You are considered at risk if:    You are sexually active and do not regularly use condoms or know the HIV status of your partner(s).  You take drugs by injection.  You are sexually active with a partner who has HIV. Talk with your health care provider about whether you are at high risk of being infected with HIV. If you choose to begin PrEP, you should first be tested for HIV. You should then be tested every 3 months for as long as you are taking PrEP.  PREGNANCY   If you are premenopausal and you may become pregnant, ask your health care provider about preconception counseling.  If you may become pregnant, take 400 to 800 micrograms (mcg) of folic acid every day.  If you want to prevent pregnancy, talk to your health care provider about birth control (contraception). OSTEOPOROSIS AND MENOPAUSE   Osteoporosis is a disease in which the bones lose minerals and strength with aging. This can result in serious bone fractures. Your risk for osteoporosis can be identified using a bone density scan.  If you are 65 years of age or older, or if you are at risk for osteoporosis and fractures, ask your health care provider if you should be screened.  Ask your health care provider whether you should take a calcium or vitamin D supplement to lower your risk for osteoporosis.  Menopause may have certain physical symptoms and risks.  Hormone replacement therapy may reduce some of these symptoms and risks. Talk to your health care  provider about whether hormone replacement therapy is right for you.  HOME CARE INSTRUCTIONS   Schedule regular health, dental, and eye exams.  Stay current with your immunizations.   Do not use any tobacco products including cigarettes, chewing tobacco, or electronic cigarettes.  If you are pregnant, do not drink alcohol.  If you are breastfeeding, limit how much and how often you drink alcohol.  Limit alcohol intake to no more than 1 drink per day for nonpregnant women. One drink equals 12 ounces of beer, 5 ounces of wine, or 1 ounces of hard liquor.  Do not use street drugs.  Do not share needles.  Ask your health care provider for help if you need support or information about quitting drugs.  Tell your health care provider if you often feel depressed.  Tell your health care provider if you have ever been abused or do not feel safe at home.   This information is not intended to replace advice given to you by your health care provider. Make sure you discuss any questions you have with your health care provider.   Document Released: 09/28/2010 Document Revised: 04/05/2014 Document Reviewed: 02/14/2013 Elsevier Interactive Patient Education 2016 [employer]     

## 2015-08-08 NOTE — Assessment & Plan Note (Signed)
Dexa scheduled - managed by gyn

## 2015-08-08 NOTE — Progress Notes (Signed)
Subjective:    Patient ID: Tiffany Hood, female    DOB: 1940-06-16, 74 y.o.   MRN: [id number]  HPI She is here to establish with a new pcp. She is here for a physical exam.    She has been through a lot in the past two years.  She had surgery for her diverticulitis in 2015 and had back surgery in 2016.  She still feeling like she is recovering.  She has not gotten back into regular exercise, has gained weight and her energy level is not what it should be. She knows she needs to start exercising.    Chronic medical problems reviewed.     Medications and allergies reviewed with patient and updated if appropriate.  Patient Active Problem List   Diagnosis Date Noted  . Diabetes ([place]) 08/08/2015  . Spinal stenosis, lumbar region, with neurogenic claudication 02/19/2015  . Anemia of other chronic disease 05/20/2013  . Osteoporosis 03/30/2013  . Hypercalcemia 03/30/2013  . History of colonic polyps 11/15/2012  . Diverticulitis of sigmoid colon 10/05/2012  . Diverticulosis 10/19/2011  . Osteoarthritis 01/28/2010  . Hyperlipidemia 12/25/2007  . Essential hypertension 02/10/2007  . Breast cancer of upper-inner quadrant of left female breast ([place]) 05/02/2002      Medication List     CALCIUM MAGNESIUM PO  Take 0.5 tablets by mouth daily. Reported on 05/06/2015     CALTRATE 600+D PO  Take 1 capsule by mouth daily.     Fish Oil 1000 MG Caps  Take 350 mg by mouth daily.     glucose blood test strip  Use as instructed to check blood sugars daily.     losartan 50 MG tablet  Commonly known as:  COZAAR  Take 1 tablet by mouth  daily     multivitamin tablet  Take 1 tablet by mouth daily.     onetouch ultrasoft lancets  Use as instructed to check blood sugars daily. DX R73.09     pravastatin 40 MG tablet  Commonly known as:  PRAVACHOL  Take 1 tablet by mouth  daily     SYSTANE 0.4-0.3 % Soln  Generic drug:  Polyethyl Glycol-Propyl Glycol  Place 1 drop into both eyes  every morning.     triamterene-hydrochlorothiazide 75-50 MG tablet  Commonly known as:  MAXZIDE  Take one-half tablet by  mouth daily     vitamin E 400 UNIT capsule  Take 400 Units by mouth daily.        Past Medical History  Diagnosis Date  . Hypertension   . Hyperlipidemia   . Osteoporosis     Fosamax Rxed 2/13  . Breast cancer ([place]) left  . Diverticulitis     09/14/12  . Diverticulosis   . Personal history of colonic adenomas 11/15/2012  . Heart murmur   . Diabetes mellitus     TYPE 2  . Tingling     right leg   . Seasonal allergies   . Pneumonia     2013  . History of urinary tract infection   . History of radiation therapy     Past Surgical History  Procedure Laterality Date  . Tonsillectomy and adenoidectomy    . Colonoscopy  2004     Tics  . Removal bone spur Left 2/14    2nd finger  . Colon polyps  2014    Dr Carlean Purl  . Breast lumpectomy Left   . Laparoscopic sigmoid colectomy  04/13/2013    DR TOTH  .  Abdominal surgery    . Laparoscopic sigmoid colectomy N/A 04/13/2013    Procedure: LAPAROSCOPIC ASSISTED SIGMOID COLECTOMY;  Surgeon: Merrie Roof, MD;  Location: [facility];  Service: General;  Laterality: N/A;  . Dilation and curettage of uterus    . Lumbar laminectomy/decompression microdiscectomy N/A 02/19/2015    Procedure: MICRODISCECTOMY AT L4-L5, CENTRAL DECOMPRESSION L4-L5 FOR SPINAL STENOSIS, FORAMINOTOMY FOR L4 L5 ROOT ON RIGHT;  Surgeon: Latanya Maudlin, MD;  Location: [facility];  Service: Orthopedics;  Laterality: N/A;  . Abdominal hysterectomy  1965    ovaries remain; for dysfunctional menses; ? endometriosis    Social History   Social History  . Marital Status: Widowed    Spouse Name: N/A  . Number of Children: N/A  . Years of Education: N/A   Social History Main Topics  . Smoking status: Never Smoker   . Smokeless tobacco: Never Used  . Alcohol Use: No  . Drug Use: No  . Sexual Activity: Not Asked   Other Topics Concern  . None    Social History Narrative   Exercise: no    Family History  Problem Relation Age of Onset  . Stroke Mother 37  . Hypertension Mother   . Lung cancer Father     smoker  . Hypertension Sister   . Hypertension Brother   . Heart disease Brother 37  . Diabetes Neg Hx   . Colon cancer Neg Hx   . Stroke Brother 58  . Hypertension Brother     Review of Systems  Constitutional: Negative for fever, chills, appetite change and fatigue.  HENT: Negative for hearing loss.   Eyes: Negative for visual disturbance.  Respiratory: Negative for cough, shortness of breath and wheezing.   Cardiovascular: Negative for chest pain, palpitations and leg swelling.  Gastrointestinal: Negative for nausea, abdominal pain, diarrhea, constipation and blood in stool.       No gerd  Genitourinary: Negative for dysuria and hematuria.  Musculoskeletal: Positive for arthralgias (hands). Negative for back pain.  Skin: Negative for color change and rash.  Neurological: Positive for numbness (numbness/tingling in right leg - residual from back surgery) and headaches (rare). Negative for dizziness.  Psychiatric/Behavioral: Negative for dysphoric mood. The patient is not nervous/anxious.        Objective:   Filed Vitals:   08/08/15 1043  BP: 126/82  Pulse: 81  Temp: 98.6 F (37 C)  Resp: 16   Filed Weights   08/08/15 1043  Weight: 164 lb (74.39 kg)   Body mass index is 29.5 kg/(m^2).   Physical Exam Constitutional: She appears well-developed and well-nourished. No distress.  HENT:  Head: Normocephalic and atraumatic.  Right Ear: External ear normal. Normal ear canal and TM Left Ear: External ear normal.  Normal ear canal and TM Mouth/Throat: Oropharynx is clear and moist.  Eyes: Conjunctivae and EOM are normal.  Neck: Neck supple. No tracheal deviation present. No thyromegaly present.  No carotid bruit  Cardiovascular: Normal rate, regular rhythm and normal heart sounds.   No murmur heard.  No  edema. Pulmonary/Chest: Effort normal and breath sounds normal. No respiratory distress. She has no wheezes. She has no rales.  Breast: deferred to Gyn Abdominal: Soft. She exhibits no distension. There is no tenderness.  Lymphadenopathy: She has no cervical adenopathy.  Skin: Skin is warm and dry. She is not diaphoretic.  Psychiatric: She has a normal mood and affect. Her behavior is normal.       Assessment & Plan:  Physical exam: Screening blood work ordered Immunizations Up to date  Colonoscopy Up to date  Mammogram  Up to date  Gyn Up to date  Dexa Up to date Eye exams  Up to date  EKG  Up to date  Exercise - not exercising, but will start Weight - she knows she needs to lose weight  - discussed exercise and decreasing portions Skin  - just saw derm and had lesions removed Substance abuse - no evidence of abuse   See Problem List for Assessment and Plan of chronic medical problems.  F/u in 6 months for diabetes

## 2015-08-08 NOTE — Progress Notes (Signed)
Pre visit review using our clinic review tool, if applicable. No additional management support is needed unless otherwise documented below in the visit note. 

## 2015-08-09 NOTE — Assessment & Plan Note (Signed)
BP well controlled Current regimen effective and well tolerated Continue current medications at current doses  

## 2015-08-09 NOTE — Assessment & Plan Note (Signed)
Check lipids On pravastatin 40 mg daily

## 2015-08-09 NOTE — Assessment & Plan Note (Signed)
Check a1c Will restart regular exercise Work on weight loss Low sugar/carb diet

## 2015-08-14 ENCOUNTER — Encounter: Payer: Self-pay | Admitting: Internal Medicine

## 2015-08-14 ENCOUNTER — Other Ambulatory Visit (INDEPENDENT_AMBULATORY_CARE_PROVIDER_SITE_OTHER): Payer: Medicare Other

## 2015-08-14 DIAGNOSIS — E119 Type 2 diabetes mellitus without complications: Secondary | ICD-10-CM

## 2015-08-14 DIAGNOSIS — Z0000 Encounter for general adult medical examination without abnormal findings: Secondary | ICD-10-CM

## 2015-08-14 LAB — CBC WITH DIFFERENTIAL/PLATELET
BASOS ABS: 0 10*3/uL (ref 0.0–0.1)
Basophils Relative: 0.6 % (ref 0.0–3.0)
EOS ABS: 0.4 10*3/uL (ref 0.0–0.7)
Eosinophils Relative: 6 % — ABNORMAL HIGH (ref 0.0–5.0)
HEMATOCRIT: 39.7 % (ref 36.0–46.0)
Hemoglobin: 13.5 g/dL (ref 12.0–15.0)
LYMPHS PCT: 26.8 % (ref 12.0–46.0)
Lymphs Abs: 1.8 10*3/uL (ref 0.7–4.0)
MCHC: 34.1 g/dL (ref 30.0–36.0)
MCV: 84.8 fl (ref 78.0–100.0)
MONOS PCT: 8.5 % (ref 3.0–12.0)
Monocytes Absolute: 0.6 10*3/uL (ref 0.1–1.0)
NEUTROS PCT: 58.1 % (ref 43.0–77.0)
Neutro Abs: 4 10*3/uL (ref 1.4–7.7)
Platelets: 242 10*3/uL (ref 150.0–400.0)
RBC: 4.68 Mil/uL (ref 3.87–5.11)
RDW: 15.1 % (ref 11.5–15.5)
WBC: 6.8 10*3/uL (ref 4.0–10.5)

## 2015-08-14 LAB — COMPREHENSIVE METABOLIC PANEL
ALK PHOS: 66 U/L (ref 39–117)
ALT: 15 U/L (ref 0–35)
AST: 16 U/L (ref 0–37)
Albumin: 4.2 g/dL (ref 3.5–5.2)
BILIRUBIN TOTAL: 0.4 mg/dL (ref 0.2–1.2)
BUN: 23 mg/dL (ref 6–23)
CALCIUM: 9.4 mg/dL (ref 8.4–10.5)
CO2: 26 mEq/L (ref 19–32)
CREATININE: 0.61 mg/dL (ref 0.40–1.20)
Chloride: 104 mEq/L (ref 96–112)
GFR: 102.06 mL/min (ref >60.00)
GLUCOSE: 103 mg/dL — AB (ref 70–99)
Potassium: 3.8 mEq/L (ref 3.5–5.1)
Sodium: 139 mEq/L (ref 135–145)
TOTAL PROTEIN: 7.1 g/dL (ref 6.0–8.3)

## 2015-08-14 LAB — LIPID PANEL
CHOL/HDL RATIO: 3
Cholesterol: 194 mg/dL (ref 0–200)
HDL: 55.8 mg/dL (ref >39.00)
NONHDL: 137.8
TRIGLYCERIDES: 245 mg/dL — AB (ref 0.0–149.0)
VLDL: 49 mg/dL — ABNORMAL HIGH (ref 0.0–40.0)

## 2015-08-14 LAB — HEMOGLOBIN A1C: Hgb A1c MFr Bld: 6.8 % — ABNORMAL HIGH (ref 4.6–6.5)

## 2015-08-14 LAB — TSH: TSH: 1.4 u[IU]/mL (ref 0.35–4.50)

## 2015-08-14 LAB — MICROALBUMIN / CREATININE URINE RATIO
CREATININE, U: 142.2 mg/dL
MICROALB UR: 1 mg/dL (ref 0.0–1.9)
MICROALB/CREAT RATIO: 0.7 mg/g (ref 0.0–30.0)

## 2015-08-14 LAB — LDL CHOLESTEROL, DIRECT: Direct LDL: 88 mg/dL

## 2015-09-10 ENCOUNTER — Ambulatory Visit
Admission: RE | Admit: 2015-09-10 | Discharge: 2015-09-10 | Disposition: A | Discharge status: Home / Self Care | Payer: Medicare Other | Source: Ambulatory Visit | Attending: Hematology and Oncology | Admitting: Hematology and Oncology

## 2015-09-10 ENCOUNTER — Ambulatory Visit
Admission: RE | Admit: 2015-09-10 | Discharge: 2015-09-10 | Disposition: A | Discharge status: Home / Self Care | Payer: Medicare Other | Source: Ambulatory Visit

## 2015-09-10 DIAGNOSIS — M810 Age-related osteoporosis without current pathological fracture: Secondary | ICD-10-CM

## 2015-09-10 DIAGNOSIS — Z1231 Encounter for screening mammogram for malignant neoplasm of breast: Secondary | ICD-10-CM

## 2016-01-19 ENCOUNTER — Encounter: Payer: Self-pay | Admitting: Internal Medicine

## 2016-01-30 ENCOUNTER — Encounter: Payer: Medicare Other | Admitting: Nurse Practitioner

## 2016-02-05 NOTE — Progress Notes (Signed)
CLINIC:  Survivorship   REASON FOR VISIT:  Routine follow-up for history of breast cancer.   BRIEF ONCOLOGIC HISTORY:  (From Dr. Geralyn Flash last note on 01/30/15)    INTERVAL HISTORY:  Tiffany Hood presents to the [facility] today for routine follow-up for her history of breast cancer.  Overall, she reports feeling pretty well.   She has occasional hot flashes, particularly at night. She also doesn't sleep well. She isn't sure if the hot flashes are contributing to her sleep disturbance. She does not think her hot flashes are bad enough for additional medications at this time.   She had back surgery in 01/2015 for ruptured disks. Her back pain is much better as a result. She continues to have some joint pain and arthritis. She had surgery for diverticulitis in 03/2013, which has resulted in her having more frequent bowel movements. She denies any blood in her stool or melena.  "I don't exercise like I should, but I am going to work on it."  Her appetite and energy levels are good.  She will see her PCP, Dr. Quay Burow, next week for her annual physical.   REVIEW OF SYSTEMS:  Review of Systems  Constitutional: Negative.   HENT:  Negative.   Eyes: Negative.   Respiratory: Negative.   Cardiovascular: Negative.   Gastrointestinal:       Frequent stools since diverticulitis surgery 2 years ago  Endocrine: Positive for hot flashes.  Genitourinary: Negative.    Musculoskeletal: Positive for arthralgias.  Skin: Negative.   Neurological: Negative.   Hematological: Negative.   Psychiatric/Behavioral: Negative.   Breast: Denies any new nodularity, masses, tenderness, nipple changes, or nipple discharge.    A 14-point review of systems was completed and was negative, except as noted above.    PAST MEDICAL/SURGICAL HISTORY:  Past Medical History:  Diagnosis Date  . Breast cancer ([place]) left  . Diabetes mellitus    TYPE 2  . Diverticulitis    09/14/12  . Diverticulosis   .  Heart murmur   . History of radiation therapy   . History of urinary tract infection   . Hyperlipidemia   . Hypertension   . Osteoporosis    Fosamax Rxed 2/13  . Personal history of colonic adenomas 11/15/2012  . Pneumonia    2013  . Seasonal allergies   . Tingling    right leg    Past Surgical History:  Procedure Laterality Date  . ABDOMINAL HYSTERECTOMY  1965   ovaries remain; for dysfunctional menses; ? endometriosis  . ABDOMINAL SURGERY    . BREAST LUMPECTOMY Left   . colon polyps  2014   Dr Carlean Purl  . COLONOSCOPY  2004    Tics  . DILATION AND CURETTAGE OF UTERUS    . LAPAROSCOPIC SIGMOID COLECTOMY  04/13/2013   DR TOTH  . LAPAROSCOPIC SIGMOID COLECTOMY N/A 04/13/2013   Procedure: LAPAROSCOPIC ASSISTED SIGMOID COLECTOMY;  Surgeon: Merrie Roof, MD;  Location: [facility];  Service: General;  Laterality: N/A;  . LUMBAR LAMINECTOMY/DECOMPRESSION MICRODISCECTOMY N/A 02/19/2015   Procedure: MICRODISCECTOMY AT L4-L5, CENTRAL DECOMPRESSION L4-L5 FOR SPINAL STENOSIS, FORAMINOTOMY FOR L4 L5 ROOT ON RIGHT;  Surgeon: Latanya Maudlin, MD;  Location: [facility];  Service: Orthopedics;  Laterality: N/A;  . removal bone spur Left 2/14   2nd finger  . TONSILLECTOMY AND ADENOIDECTOMY       ALLERGIES:  Allergies  Allergen Reactions  . Celestone [Betamethasone] Nausea Only    Sweated; face and chest with redness  for several days      CURRENT MEDICATIONS:  Outpatient Encounter Prescriptions as of 02/06/2016  Medication Sig Note  . Calcium Carbonate-Vitamin D (CALTRATE 600+D PO) Take 1 capsule by mouth daily.  02/18/2015: Pt has currently stopped taking   . Calcium-Magnesium-Vitamin D (CALCIUM MAGNESIUM PO) Take 0.5 tablets by mouth daily. Reported on 05/06/2015 05/06/2015: Mg build up of plaque  . glucose blood test strip Use as instructed to check blood sugars daily.   . Lancets (ONETOUCH ULTRASOFT) lancets Use as instructed to check blood sugars daily. DX R73.09   . losartan (COZAAR) 50 MG  tablet Take 1 tablet by mouth  daily   . Multiple Vitamin (MULTIVITAMIN) tablet Take 1 tablet by mouth daily.   02/18/2015: Pt has currently stopped taking   . Omega-3 Fatty Acids (FISH OIL) 1000 MG CAPS Take 350 mg by mouth daily.  02/18/2015: Pt has currently stopped taking   . Polyethyl Glycol-Propyl Glycol (SYSTANE) 0.4-0.3 % SOLN Place 1 drop into both eyes every morning.   . pravastatin (PRAVACHOL) 40 MG tablet Take 1 tablet by mouth  daily   . triamterene-hydrochlorothiazide (MAXZIDE) 75-50 MG tablet Take one-half tablet by  mouth daily   . vitamin E 400 UNIT capsule Take 400 Units by mouth daily.   05/06/2015: Patient now taking   No facility-administered encounter medications on file as of 02/06/2016.      ONCOLOGIC FAMILY HISTORY:  Family History  Problem Relation Age of Onset  . Stroke Mother 21  . Hypertension Mother   . Lung cancer Father     smoker  . Hypertension Sister   . Hypertension Brother   . Heart disease Brother 72  . Diabetes Neg Hx   . Colon cancer Neg Hx   . Stroke Brother 78  . Hypertension Brother     GENETIC COUNSELING/TESTING: No records available for review.  SOCIAL HISTORY:  Tiffany Hood is widowed and lives alone in [place], [place]. She has 1 son, who works at [employer] in [place].  She has 3 grandchildren; 2 boys and 1 girl; ages 61, 107, & 54. She is retired. She denies any current tobacco or illicit drug use.  She drinks alcohol occasionally.   PHYSICAL EXAMINATION:  Vital Signs: Vitals:   02/06/16 1016  BP: (!) 145/51  Pulse: 83  Resp: 18  Temp: 97.4 F (36.3 C)   Filed Weights   02/06/16 1016  Weight: 162 lb 11.2 oz (73.8 kg)   General: Well-nourished, well-appearing female in no acute distress.  She is unaccompanied today.   HEENT: Head is normocephalic.  Pupils equal and reactive to light. Conjunctivae clear without exudate.  Sclerae anicteric. Oral mucosa is pink, moist.  Oropharynx is pink without lesions or erythema.    Lymph: No cervical, supraclavicular, or infraclavicular lymphadenopathy noted on palpation.  Cardiovascular: Regular rate and rhythm.Marland Kitchen Respiratory: Clear to auscultation bilaterally. Chest expansion symmetric; breathing non-labored.  Breast Exam:  -Left breast: No appreciable masses on palpation. No skin redness, thickening, or peau d'orange appearance; no nipple retraction or nipple discharge; healed scar without erythema or nodularity.  -Right breast: No appreciable masses on palpation. No skin redness, thickening, or peau d'orange appearance; no nipple retraction or nipple discharge. GI: Abdomen soft and round; non-tender, non-distended. Bowel sounds normoactive. No hepatosplenomegaly.   GU: Deferred.  Neuro: No focal deficits. Steady gait.  Psych: Mood and affect normal and appropriate for situation.  Extremities: No edema. Skin: Warm and dry.  LABORATORY DATA:  None  for this visit.   DIAGNOSTIC IMAGING:  Most recent mammogram: 09/10/15     ASSESSMENT AND PLAN:  Ms.. Hood is a pleasant 75 y.o. female with history of Stage IA left breast invasive ductal carcinoma, ER+, diagnosed in 04/2002; treated with lumpectomy, adjuvant radiation therapy, and anti-estrogen therapy with anastrozole from 08/2002-08/2012.  She [facility] for surveillance and routine follow-up.   1. History of Stage IA left breast cancer:  Tiffany Hood is currently clinically and radiographically without evidence of disease or recurrence of breast cancer. She completed 10 years of anti-estrogen therapy in 08/2012.  She will be due for annual mammogram in 08/2016; orders placed today. We discussed the option of having her "graduate" from follow-up here at the cancer center, given that she is 13 years out from her initial diagnosis and doing very well.  She prefers to continue to be seen annually at the cancer center, which we are happy to accommodate. Therefore, Tiffany Hood will return to the cancer  center in 1 year to see Survivorship NP for continued surveillance.   2. Hot flashes likely secondary to menopause: We did discuss that gabapentin is often helpful for women to have hot flashes that are worse at night. The gabapentin also has the added benefit of potentially helping her sleep. However, Tiffany Hood is not interested in pursuing any pharmacologic management of her hot flashes at this time, which is reasonable. I did encourage her to let us know if she changed her mind, and I would be happy to call this medication in for her when needed. She agreed with this plan.  3. Bone health:  Given Tiffany Hood's age, history of breast cancer, and her previous 10 years of anti-estrogen therapy with anastrozole, she is at risk for bone demineralization. Her last DEXA scan was on 09/10/15 and showed osteopenia (T-score -1.2).  She understands that she will be due for additional bone mineral density testing until 08/2017. In the meantime, she was encouraged to increase her consumption of foods rich in calcium, as well as increase her weight-bearing activities.  She was given education on specific food and activities to promote bone health.  4. Health maintenance and wellness promotion: Tiffany Hood was encouraged to consume 5-7 servings of fruits and vegetables per day. She was also encouraged to engage in moderate to vigorous exercise for 30 minutes per day most days of the week. She was instructed to limit her alcohol consumption and continue to abstain from tobacco use.    Dispo:  -Annual mammogram due in 08/2016; orders placed today.  -Return to cancer center to see Survivorship NP in 01/2017.   A total of 20 minutes of face-to-face time was spent with this patient with greater than 50% of that time in counseling and care-coordination.   Mike Craze, NP Survivorship Program [facility] [phone]   Note: PRIMARY CARE PROVIDER Binnie Rail, [place] [phone]

## 2016-02-06 ENCOUNTER — Encounter: Payer: Self-pay | Admitting: Adult Health

## 2016-02-06 ENCOUNTER — Ambulatory Visit (HOSPITAL_BASED_OUTPATIENT_CLINIC_OR_DEPARTMENT_OTHER): Payer: Medicare Other | Admitting: Adult Health

## 2016-02-06 ENCOUNTER — Telehealth: Payer: Self-pay | Admitting: Adult Health

## 2016-02-06 VITALS — BP 145/51 | HR 83 | Temp 97.4°F | Resp 18 | Ht 62.5 in | Wt 162.7 lb

## 2016-02-06 DIAGNOSIS — Z170 Estrogen receptor positive status [ER+]: Principal | ICD-10-CM

## 2016-02-06 DIAGNOSIS — C50212 Malignant neoplasm of upper-inner quadrant of left female breast: Secondary | ICD-10-CM

## 2016-02-06 DIAGNOSIS — N951 Menopausal and female climacteric states: Secondary | ICD-10-CM | POA: Diagnosis not present

## 2016-02-06 DIAGNOSIS — Z853 Personal history of malignant neoplasm of breast: Secondary | ICD-10-CM | POA: Diagnosis not present

## 2016-02-06 DIAGNOSIS — Z1231 Encounter for screening mammogram for malignant neoplasm of breast: Secondary | ICD-10-CM

## 2016-02-06 NOTE — Telephone Encounter (Signed)
GAVE PATIENT AVS REPORT AND APPOINTMENTS November 2018. PATIENT TO CALL FOR MAMMO.

## 2016-02-10 ENCOUNTER — Encounter: Payer: Self-pay | Admitting: Internal Medicine

## 2016-02-10 ENCOUNTER — Other Ambulatory Visit (INDEPENDENT_AMBULATORY_CARE_PROVIDER_SITE_OTHER): Payer: Medicare Other

## 2016-02-10 ENCOUNTER — Ambulatory Visit (INDEPENDENT_AMBULATORY_CARE_PROVIDER_SITE_OTHER): Payer: Medicare Other | Admitting: Internal Medicine

## 2016-02-10 VITALS — BP 156/74 | HR 92 | Temp 98.0°F | Resp 16 | Ht 63.0 in | Wt 161.0 lb

## 2016-02-10 DIAGNOSIS — I10 Essential (primary) hypertension: Secondary | ICD-10-CM

## 2016-02-10 DIAGNOSIS — E119 Type 2 diabetes mellitus without complications: Secondary | ICD-10-CM | POA: Diagnosis not present

## 2016-02-10 DIAGNOSIS — E785 Hyperlipidemia, unspecified: Secondary | ICD-10-CM

## 2016-02-10 LAB — COMPREHENSIVE METABOLIC PANEL
ALK PHOS: 73 U/L (ref 39–117)
ALT: 19 U/L (ref 0–35)
AST: 17 U/L (ref 0–37)
Albumin: 4.4 g/dL (ref 3.5–5.2)
BILIRUBIN TOTAL: 0.4 mg/dL (ref 0.2–1.2)
BUN: 23 mg/dL (ref 6–23)
CALCIUM: 10.2 mg/dL (ref 8.4–10.5)
CO2: 28 mEq/L (ref 19–32)
Chloride: 101 mEq/L (ref 96–112)
Creatinine, Ser: 0.67 mg/dL (ref 0.40–1.20)
GFR: 91.46 mL/min (ref >60.00)
GLUCOSE: 104 mg/dL — AB (ref 70–99)
Potassium: 4.1 mEq/L (ref 3.5–5.1)
Sodium: 138 mEq/L (ref 135–145)
TOTAL PROTEIN: 7.6 g/dL (ref 6.0–8.3)

## 2016-02-10 LAB — HEMOGLOBIN A1C: Hgb A1c MFr Bld: 6.5 % (ref 4.6–6.5)

## 2016-02-10 NOTE — Progress Notes (Signed)
Pre visit review using our clinic review tool, if applicable. No additional management support is needed unless otherwise documented below in the visit note. 

## 2016-02-10 NOTE — Patient Instructions (Signed)

## 2016-02-10 NOTE — Assessment & Plan Note (Addendum)
Sugars well controlled at home Increase exercise Check a1c

## 2016-02-10 NOTE — Progress Notes (Signed)
Subjective:    Patient ID: Tiffany Hood, female    DOB: 06-03-1940, 75 y.o.   MRN: [id number]  HPI The patient is here for follow up.  Diabetes: She is taking her medication daily as prescribed. She is compliant with a diabetic diet. She is exercising regularly, but not enough. She monitors her sugars and they have been running 103, 126, typically 114. She checks her feet daily and denies foot lesions. She is up-to-date with an ophthalmology examination.   Hypertension: She is taking her medication daily. She is compliant with a low sodium diet.  She denies chest pain, palpitations, edema, shortness of breath and regular headaches. She is exercising regularly, but enough.  She does not monitor her blood pressure at home.    Hyperlipidemia: She is taking her medication daily. She is compliant with a low fat/cholesterol diet. She is exercising regularly, but not enough. She denies myalgias.    Medications and allergies reviewed with patient and updated if appropriate.  Patient Active Problem List   Diagnosis Date Noted  . Diabetes ([place]) 08/08/2015  . Spinal stenosis, lumbar region, with neurogenic claudication 02/19/2015  . Osteoporosis 03/30/2013  . History of colonic polyps 11/15/2012  . Diverticulitis of sigmoid colon 10/05/2012  . Diverticulosis 10/19/2011  . Osteoarthritis 01/28/2010  . Hyperlipidemia 12/25/2007  . Essential hypertension 02/10/2007  . Breast cancer of upper-inner quadrant of left female breast ([place]) 05/02/2002    Current Outpatient Prescriptions on File Prior to Visit  Medication Sig Dispense Refill  . Calcium Carbonate-Vitamin D (CALTRATE 600+D PO) Take 1 capsule by mouth daily.     Marland Kitchen glucose blood test strip Use as instructed to check blood sugars daily. 100 each 3  . Lancets (ONETOUCH ULTRASOFT) lancets Use as instructed to check blood sugars daily. DX R73.09 100 each 3  . losartan (COZAAR) 50 MG tablet Take 1 tablet by mouth  daily 90 tablet 3  .  Multiple Vitamin (MULTIVITAMIN) tablet Take 1 tablet by mouth daily.      . Omega-3 Fatty Acids (FISH OIL) 1000 MG CAPS Take 350 mg by mouth daily.     Vladimir Faster Glycol-Propyl Glycol (SYSTANE) 0.4-0.3 % SOLN Place 1 drop into both eyes every morning.    . pravastatin (PRAVACHOL) 40 MG tablet Take 1 tablet by mouth  daily 90 tablet 3  . triamterene-hydrochlorothiazide (MAXZIDE) 75-50 MG tablet Take one-half tablet by  mouth daily 45 tablet 3  . vitamin E 400 UNIT capsule Take 400 Units by mouth daily.       No current facility-administered medications on file prior to visit.     Past Medical History:  Diagnosis Date  . Breast cancer ([place]) left  . Diabetes mellitus    TYPE 2  . Diverticulitis    09/14/12  . Diverticulosis   . Heart murmur   . History of radiation therapy   . History of urinary tract infection   . Hyperlipidemia   . Hypertension   . Osteoporosis    Fosamax Rxed 2/13  . Personal history of colonic adenomas 11/15/2012  . Pneumonia    2013  . Seasonal allergies   . Tingling    right leg     Past Surgical History:  Procedure Laterality Date  . ABDOMINAL HYSTERECTOMY  1965   ovaries remain; for dysfunctional menses; ? endometriosis  . ABDOMINAL SURGERY    . BREAST LUMPECTOMY Left   . colon polyps  2014   Dr Carlean Purl  . COLONOSCOPY  2004    Tics  . DILATION AND CURETTAGE OF UTERUS    . LAPAROSCOPIC SIGMOID COLECTOMY  04/13/2013   DR TOTH  . LAPAROSCOPIC SIGMOID COLECTOMY N/A 04/13/2013   Procedure: LAPAROSCOPIC ASSISTED SIGMOID COLECTOMY;  Surgeon: Merrie Roof, MD;  Location: [facility];  Service: General;  Laterality: N/A;  . LUMBAR LAMINECTOMY/DECOMPRESSION MICRODISCECTOMY N/A 02/19/2015   Procedure: MICRODISCECTOMY AT L4-L5, CENTRAL DECOMPRESSION L4-L5 FOR SPINAL STENOSIS, FORAMINOTOMY FOR L4 L5 ROOT ON RIGHT;  Surgeon: Latanya Maudlin, MD;  Location: [facility];  Service: Orthopedics;  Laterality: N/A;  . removal bone spur Left 2/14   2nd finger  .  TONSILLECTOMY AND ADENOIDECTOMY      Social History   Social History  . Marital status: Widowed    Spouse name: N/A  . Number of children: N/A  . Years of education: N/A   Social History Main Topics  . Smoking status: Never Smoker  . Smokeless tobacco: Never Used  . Alcohol use No  . Drug use: No  . Sexual activity: Not Asked   Other Topics Concern  . None   Social History Narrative   Exercise: no    Family History  Problem Relation Age of Onset  . Stroke Mother 66  . Hypertension Mother   . Lung cancer Father     smoker  . Hypertension Sister   . Hypertension Brother   . Heart disease Brother 80  . Diabetes Neg Hx   . Colon cancer Neg Hx   . Stroke Brother 76  . Hypertension Brother     Review of Systems  Constitutional: Negative for fever.  Respiratory: Negative for cough, shortness of breath and wheezing.   Cardiovascular: Negative for chest pain, palpitations and leg swelling.  Musculoskeletal: Positive for arthralgias (left thumb). Negative for myalgias.  Neurological: Positive for headaches (rare). Negative for light-headedness.       Objective:   Vitals:   02/10/16 1043  BP: (!) 156/74  Pulse: 92  Resp: 16  Temp: 98 F (36.7 C)   Filed Weights   02/10/16 1043  Weight: 161 lb (73 kg)   Body mass index is 28.52 kg/m.   Physical Exam    Constitutional: Appears well-developed and well-nourished. No distress.  HENT:  Head: Normocephalic and atraumatic.  Neck: Neck supple. No tracheal deviation present. No thyromegaly present.  No cervical lymphadenopathy Cardiovascular: Normal rate, regular rhythm and normal heart sounds.   No murmur heard. No carotid bruit .  No edema Pulmonary/Chest: Effort normal and breath sounds normal. No respiratory distress. No has no wheezes. No rales.  Skin: Skin is warm and dry. Not diaphoretic.  Psychiatric: Normal mood and affect. Behavior is normal.     Assessment & Plan:    See Problem List for  Assessment and Plan of chronic medical problems.   Follow-up in 6 months

## 2016-02-10 NOTE — Assessment & Plan Note (Addendum)
BP typically well controlled, elevated here We'll continue current medications at current doses CMP Work on increasing exercise

## 2016-02-10 NOTE — Assessment & Plan Note (Signed)
Lipid panel has been well controlled Not fasting today so we will not check lipids Continue pravastatin at current dose

## 2016-02-12 ENCOUNTER — Encounter: Payer: Self-pay | Admitting: Internal Medicine

## 2016-04-16 ENCOUNTER — Other Ambulatory Visit: Payer: Self-pay | Admitting: Internal Medicine

## 2016-06-03 ENCOUNTER — Other Ambulatory Visit: Payer: Self-pay | Admitting: Adult Health

## 2016-06-03 DIAGNOSIS — Z1231 Encounter for screening mammogram for malignant neoplasm of breast: Secondary | ICD-10-CM

## 2016-06-23 LAB — HM DIABETES EYE EXAM

## 2016-07-09 IMAGING — CR DG CHEST 2V
2 series · 2 of 2 positions shown · non-contrast
Comparison: 04/05/2013

CLINICAL DATA: Preop evaluation for lumbar decompression
laminectomy. History of breast cancer. Preoperative clearance.

EXAM:
CHEST  2 VIEW

[w chest pa]
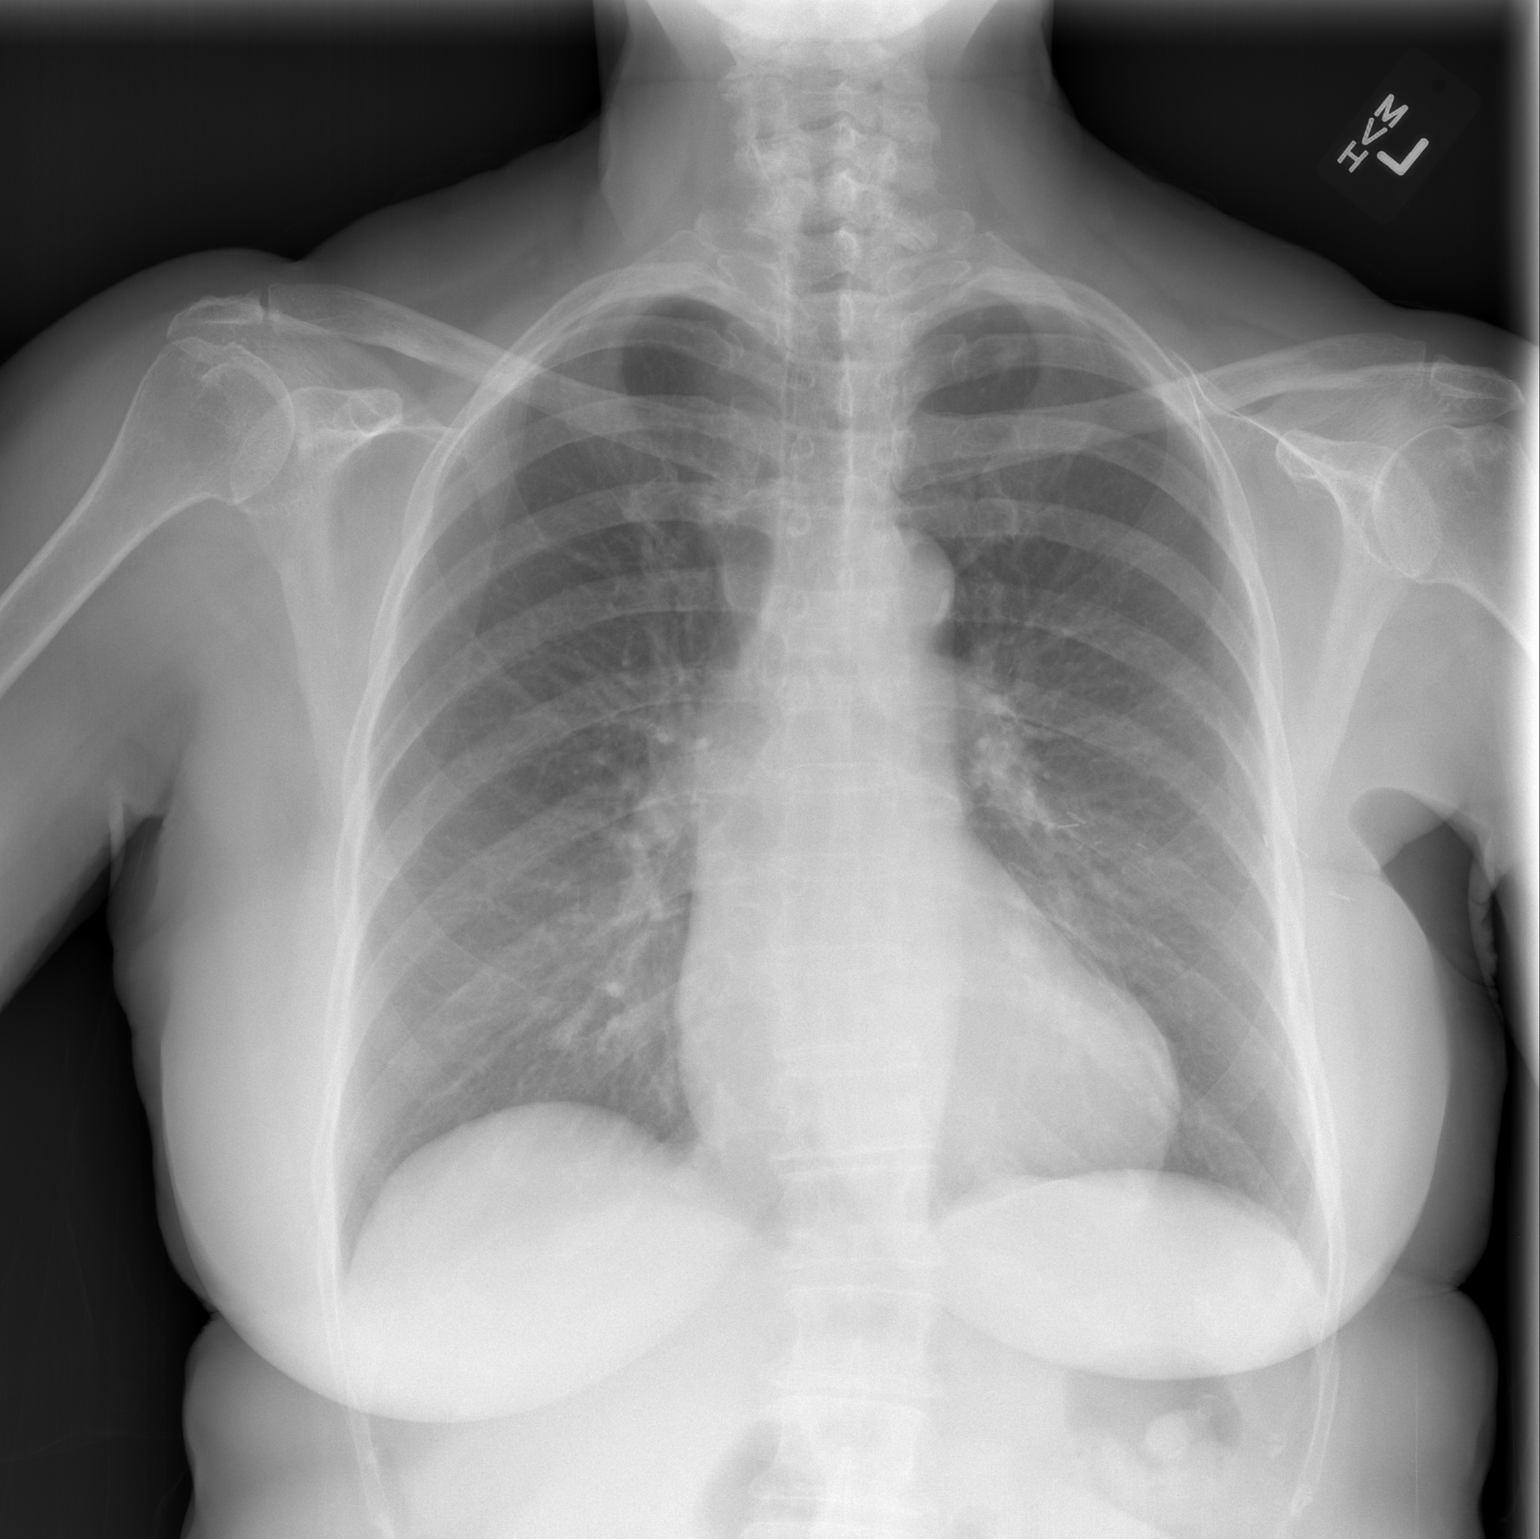

[w chest lat]
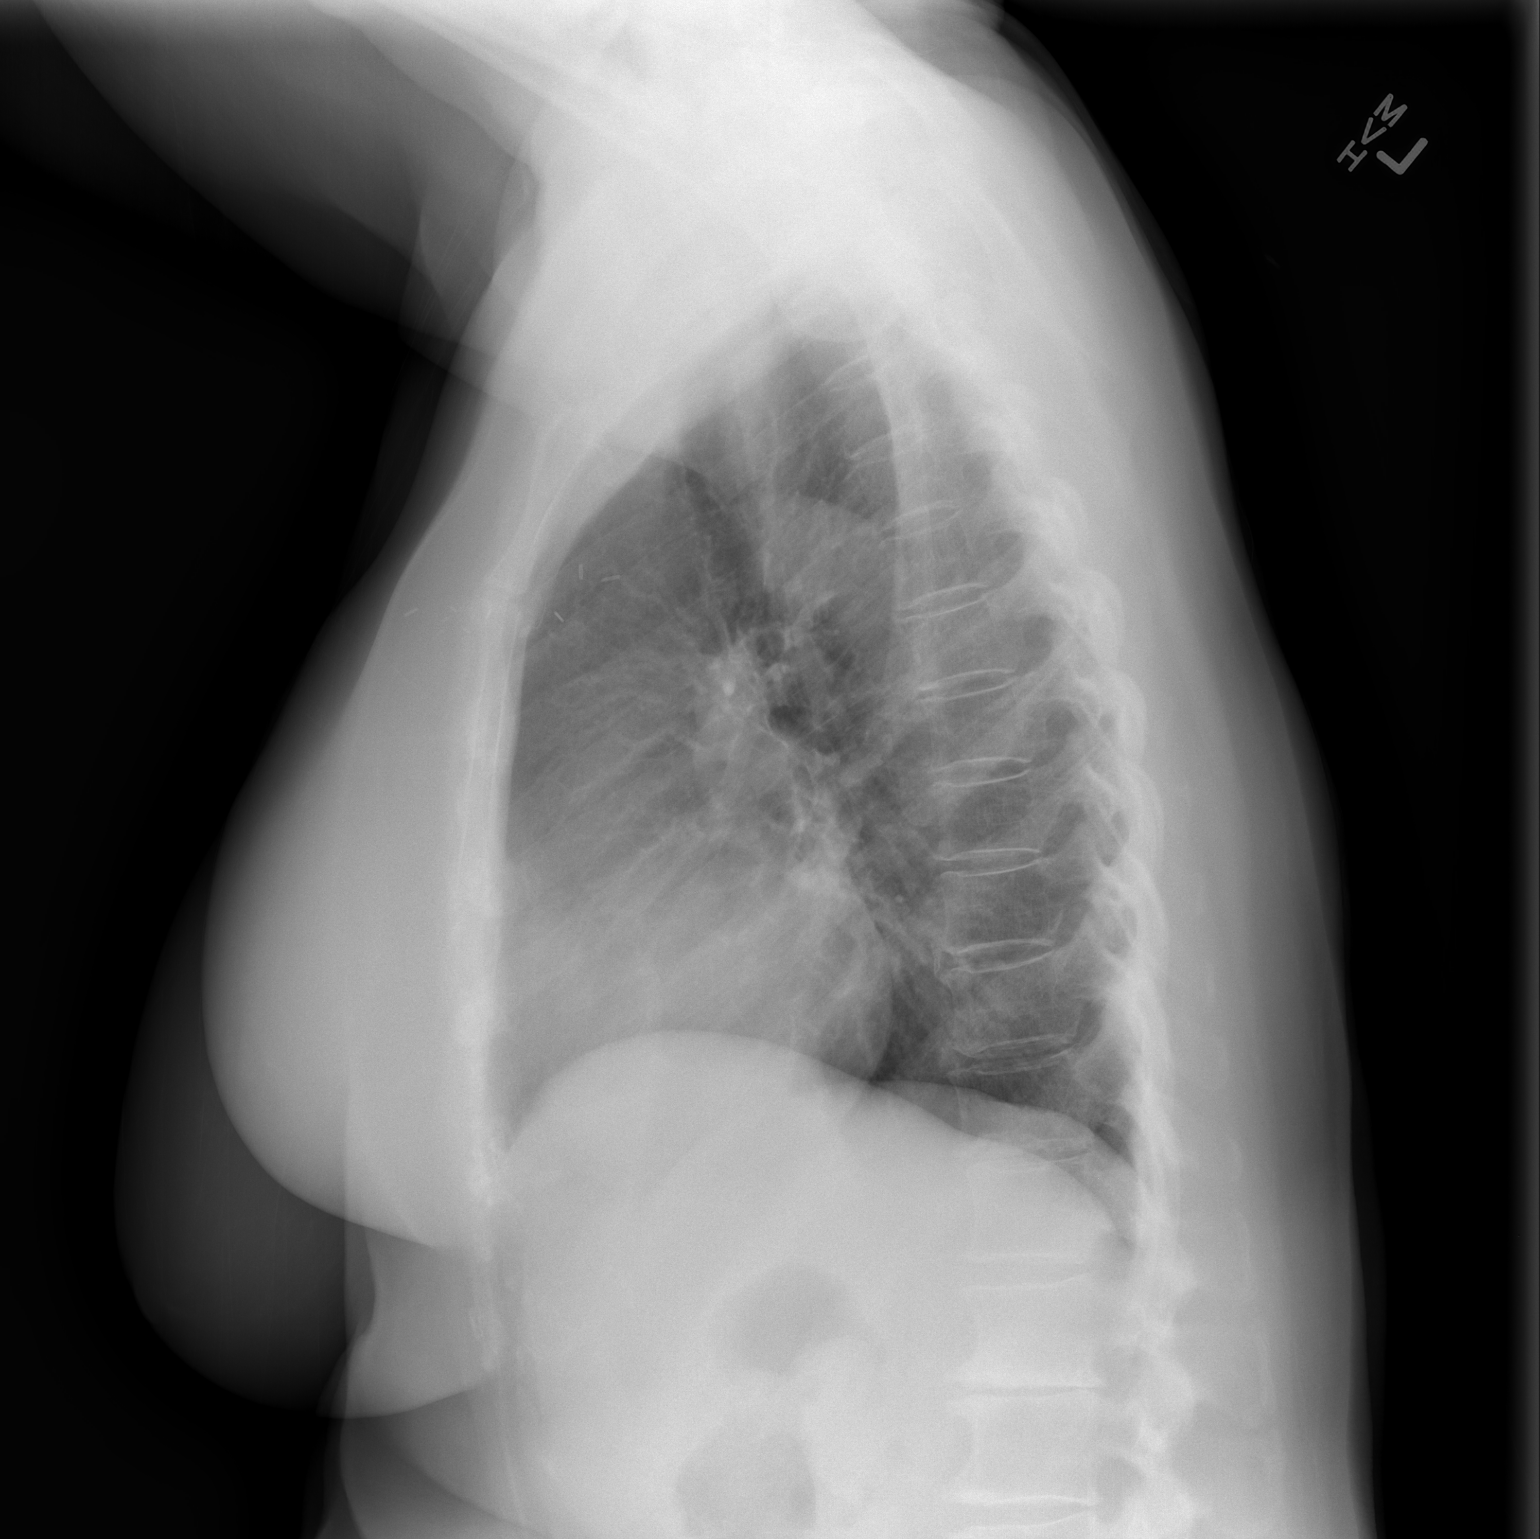

[2 of 2 positions shown; findings below may reference images not displayed]

FINDINGS: The heart size and mediastinal contours are within normal limits.
Both lungs are clear. Stable mild degenerative changes of the lower
thoracic and upper lumbar spine. Postop changes over the left upper
breast and axilla. Trachea is midline. Atherosclerosis of aorta.
IMPRESSION: Chronic and postoperative findings as above. No significant interval
change or new process.

## 2016-07-10 IMAGING — DX DG SPINE 1V PORT
1 series · 1 of 1 positions shown · non-contrast
Comparison: Study obtained earlier in the day

CLINICAL DATA: Intraoperative lumbar surgery

EXAM:
PORTABLE SPINE - 1 VIEW

[l-spine x-table]
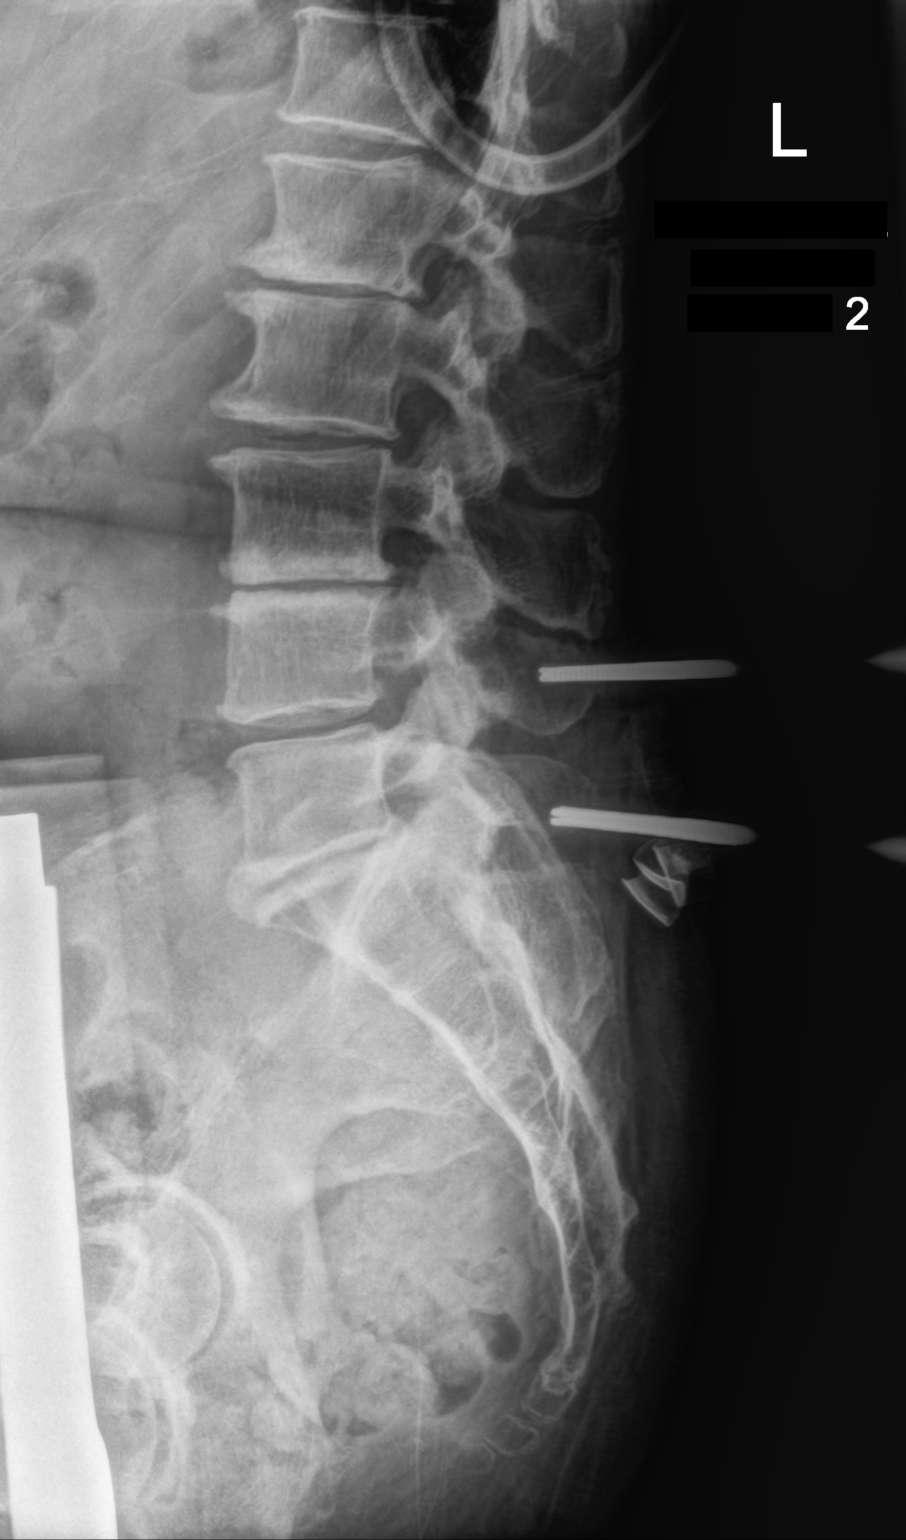

[1 of 1 positions shown; findings below may reference images not displayed]

FINDINGS: Cross-table lateral image is labeled #2. Metallic probe tips are
posterior to the L4-5 interspace and L5-S1 interspace levels. No
fracture or spondylolisthesis. Multilevel arthropathy is stable.
IMPRESSION: Metallic probe tips are posterior to the L4-5 and L5-S1 interspace
levels respectively. Extensive lumbar arthropathy. No fracture or
spondylolisthesis.

## 2016-07-10 IMAGING — DX DG SPINE 1V PORT
1 series · 1 of 1 positions shown · non-contrast
Comparison: Film from earlier in the same day

CLINICAL DATA: Intraoperative localization for lumbar surgery

EXAM:
PORTABLE SPINE - 1 VIEW

[l-spine x-table]
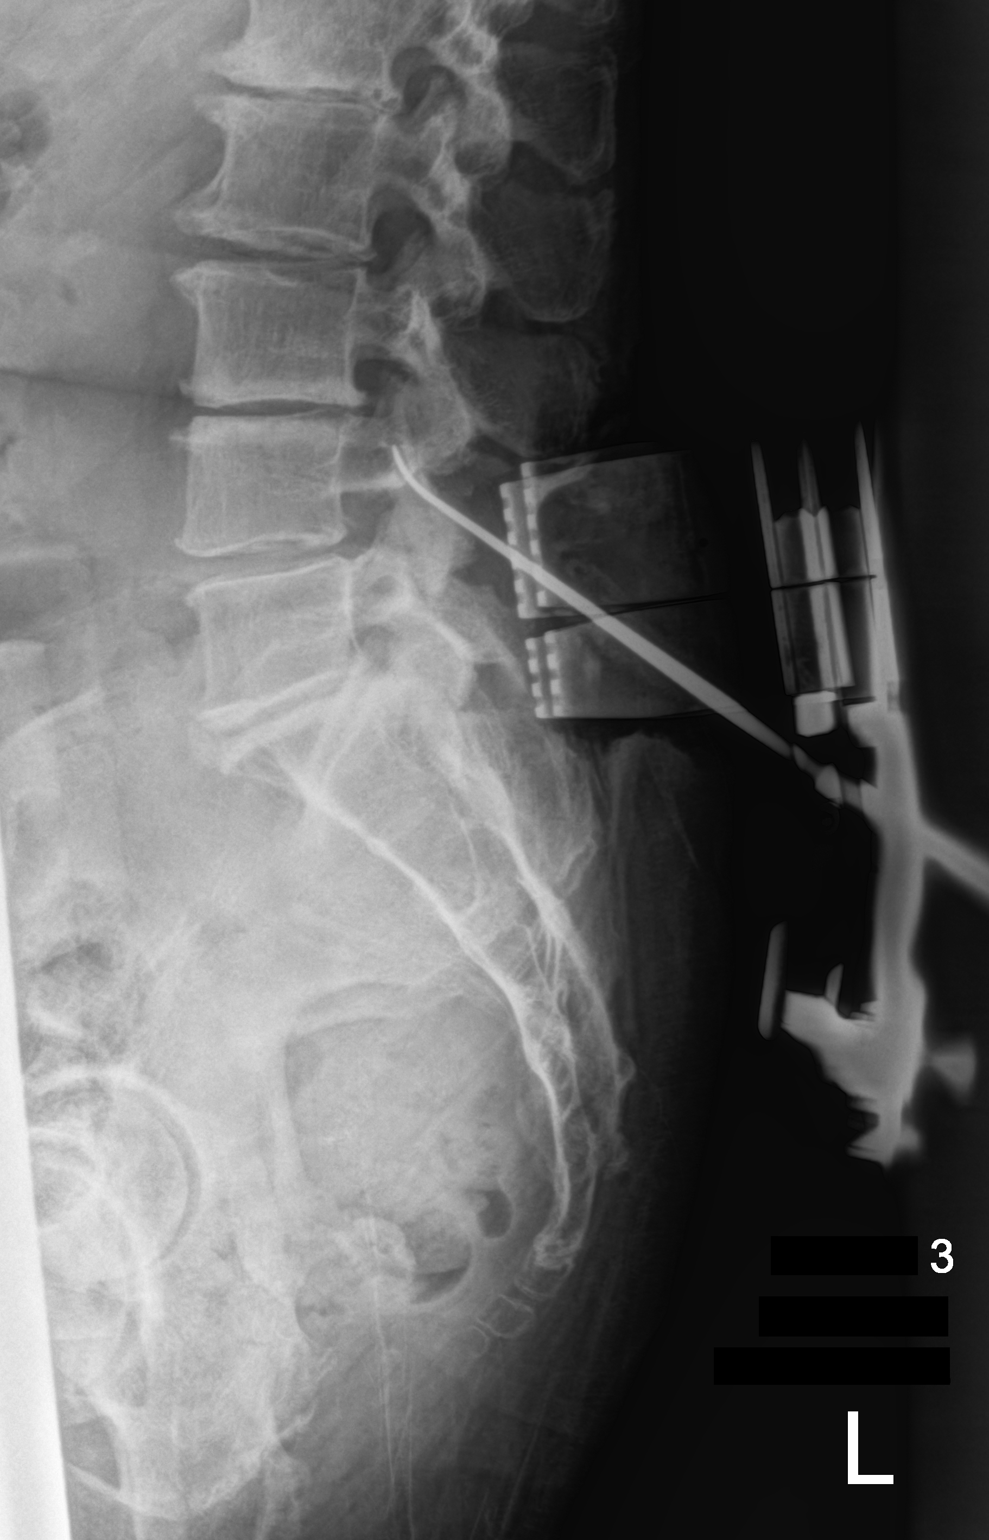

[1 of 1 positions shown; findings below may reference images not displayed]

FINDINGS: The numbering nomenclature is similar to that utilized on the
earlier film. A surgical instrument is now seen posterior to the L4
vertebral body within the spinal canal. Surgical retractors are
noted as well.
IMPRESSION: Intraoperative localization at L4 as described.

## 2016-07-10 IMAGING — DX DG SPINE 1V PORT
1 series · 1 of 1 positions shown · non-contrast
Comparison: February 18, 2015

CLINICAL DATA: Intraoperative lumbar surgery

EXAM:
PORTABLE SPINE - 1 VIEW

[l-spine x-table]
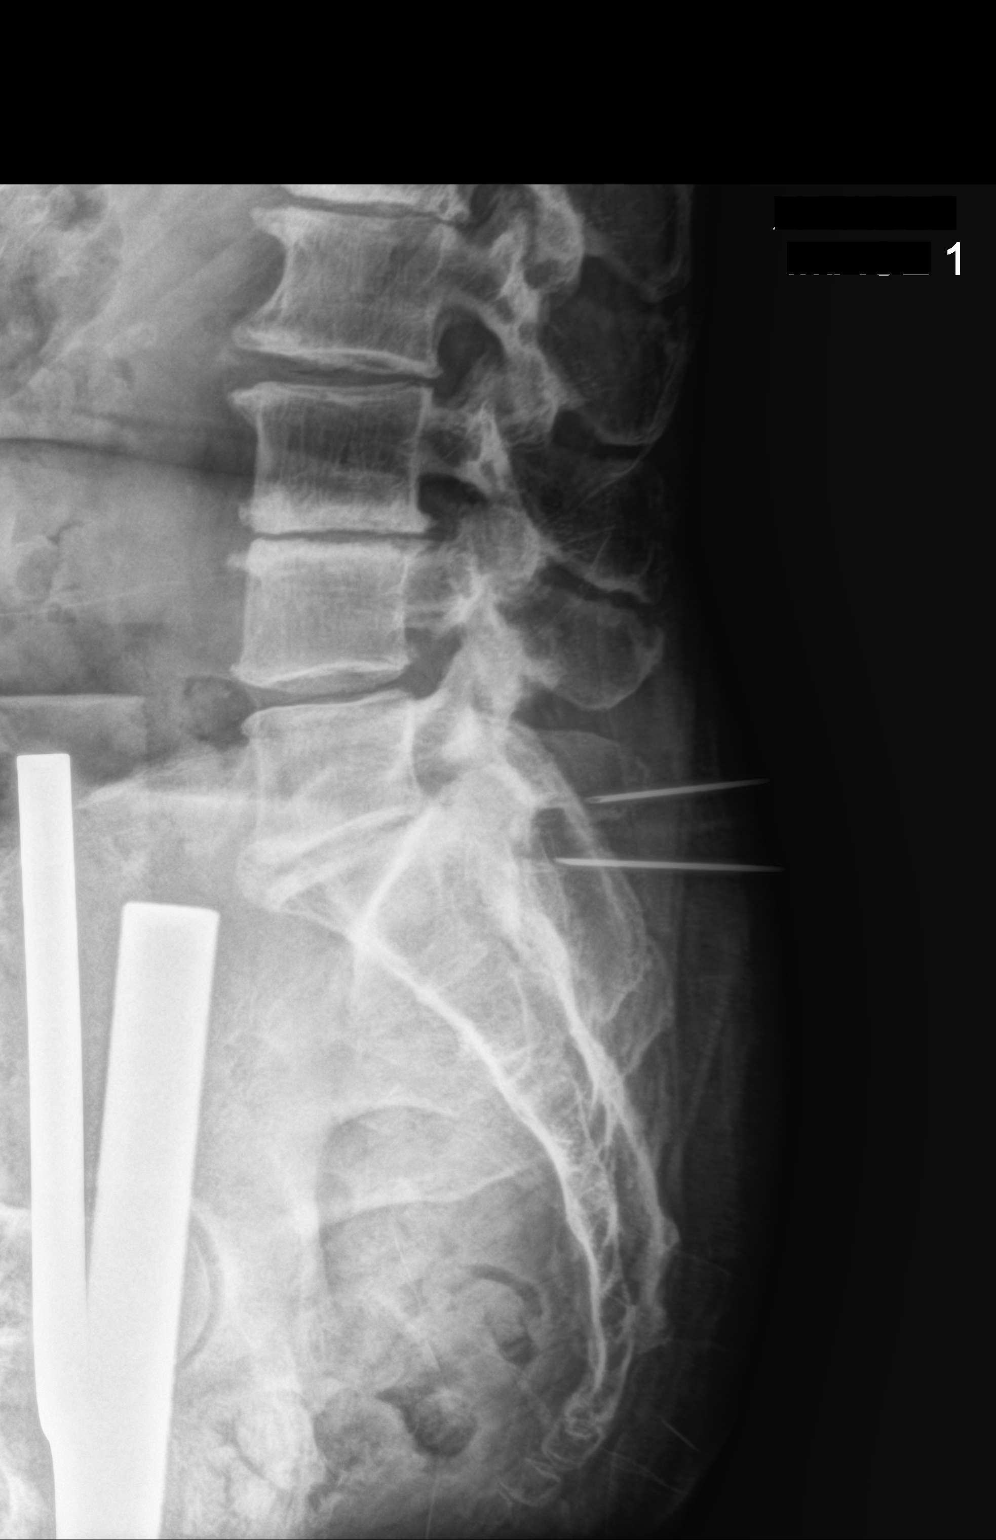

[1 of 1 positions shown; findings below may reference images not displayed]

FINDINGS: Cross-table lateral lumbar image is labeled #1. There is a metallic
probe with the tip posterior to the L5 interspace level. A second
metallic probe has its tip posterior to the S1 vertebral body level.
There is moderately severe disc space narrowing at L1-2, L3-4, and
L5-S1. There is moderate narrowing at other levels. No fracture or
spondylolisthesis.
IMPRESSION: Metallic probe tips are posterior to the L5-S1 interspace level and
posterior to the S1 vertebral body level respectively. Extensive
arthropathy. No fracture or spondylolisthesis.

## 2016-08-02 LAB — HM DIABETES EYE EXAM

## 2016-08-10 ENCOUNTER — Ambulatory Visit (INDEPENDENT_AMBULATORY_CARE_PROVIDER_SITE_OTHER): Payer: Medicare Other | Admitting: Internal Medicine

## 2016-08-10 ENCOUNTER — Other Ambulatory Visit (INDEPENDENT_AMBULATORY_CARE_PROVIDER_SITE_OTHER): Payer: Medicare Other

## 2016-08-10 ENCOUNTER — Encounter: Payer: Self-pay | Admitting: Internal Medicine

## 2016-08-10 VITALS — BP 144/90 | HR 84 | Temp 98.0°F | Resp 16 | Wt 162.0 lb

## 2016-08-10 DIAGNOSIS — E785 Hyperlipidemia, unspecified: Secondary | ICD-10-CM

## 2016-08-10 DIAGNOSIS — E010 Iodine-deficiency related diffuse (endemic) goiter: Secondary | ICD-10-CM | POA: Diagnosis not present

## 2016-08-10 DIAGNOSIS — E119 Type 2 diabetes mellitus without complications: Secondary | ICD-10-CM

## 2016-08-10 DIAGNOSIS — I10 Essential (primary) hypertension: Secondary | ICD-10-CM | POA: Diagnosis not present

## 2016-08-10 LAB — CBC WITH DIFFERENTIAL/PLATELET
BASOS PCT: 0.6 % (ref 0.0–3.0)
Basophils Absolute: 0 10*3/uL (ref 0.0–0.1)
EOS PCT: 4.7 % (ref 0.0–5.0)
Eosinophils Absolute: 0.4 10*3/uL (ref 0.0–0.7)
HEMATOCRIT: 42.3 % (ref 36.0–46.0)
HEMOGLOBIN: 14 g/dL (ref 12.0–15.0)
LYMPHS PCT: 23.8 % (ref 12.0–46.0)
Lymphs Abs: 1.9 10*3/uL (ref 0.7–4.0)
MCHC: 33 g/dL (ref 30.0–36.0)
MCV: 86.5 fl (ref 78.0–100.0)
MONOS PCT: 10.2 % (ref 3.0–12.0)
Monocytes Absolute: 0.8 10*3/uL (ref 0.1–1.0)
NEUTROS ABS: 4.7 10*3/uL (ref 1.4–7.7)
Neutrophils Relative %: 60.7 % (ref 43.0–77.0)
Platelets: 245 10*3/uL (ref 150.0–400.0)
RBC: 4.89 Mil/uL (ref 3.87–5.11)
RDW: 15.1 % (ref 11.5–15.5)
WBC: 7.8 10*3/uL (ref 4.0–10.5)

## 2016-08-10 LAB — HEMOGLOBIN A1C: Hgb A1c MFr Bld: 6.7 % — ABNORMAL HIGH (ref 4.6–6.5)

## 2016-08-10 LAB — LIPID PANEL
CHOL/HDL RATIO: 3
Cholesterol: 168 mg/dL (ref 0–200)
HDL: 62.6 mg/dL (ref >39.00)
NONHDL: 105.11
Triglycerides: 240 mg/dL — ABNORMAL HIGH (ref 0.0–149.0)
VLDL: 48 mg/dL — ABNORMAL HIGH (ref 0.0–40.0)

## 2016-08-10 LAB — COMPREHENSIVE METABOLIC PANEL
ALT: 23 U/L (ref 0–35)
AST: 19 U/L (ref 0–37)
Albumin: 4.5 g/dL (ref 3.5–5.2)
Alkaline Phosphatase: 65 U/L (ref 39–117)
BUN: 25 mg/dL — ABNORMAL HIGH (ref 6–23)
CALCIUM: 9.9 mg/dL (ref 8.4–10.5)
CHLORIDE: 101 meq/L (ref 96–112)
CO2: 29 meq/L (ref 19–32)
CREATININE: 0.67 mg/dL (ref 0.40–1.20)
GFR: 91.34 mL/min (ref >60.00)
Glucose, Bld: 87 mg/dL (ref 70–99)
POTASSIUM: 4 meq/L (ref 3.5–5.1)
SODIUM: 138 meq/L (ref 135–145)
Total Bilirubin: 0.3 mg/dL (ref 0.2–1.2)
Total Protein: 7.6 g/dL (ref 6.0–8.3)

## 2016-08-10 LAB — LDL CHOLESTEROL, DIRECT: LDL DIRECT: 72 mg/dL

## 2016-08-10 LAB — TSH: TSH: 1.12 u[IU]/mL (ref 0.35–4.50)

## 2016-08-10 NOTE — Assessment & Plan Note (Signed)
Check a1c Low sugar / carb diet Stressed regular exercise, weight loss  

## 2016-08-10 NOTE — Progress Notes (Signed)
Subjective:    Patient ID: Tiffany Hood, female    DOB: 11-14-1940, 76 y.o.   MRN: [id number]  HPI The patient is here for follow up.  Hypertension: She is taking her medication daily. She is compliant with a low sodium diet.  She denies chest pain, palpitations, edema, shortness of breath and regular headaches. She is not exercising regularly.  She does some yard work.  She does not monitor her blood pressure at home.    Diabetes: She is controlling her sugars with lifestyle. She is compliant with a diabetic diet. She is not exercising regularly. She monitors her sugars and they have been running 122, 107 recently. She checks her feet daily and denies foot lesions. She is up-to-date with an ophthalmology examination.   Hyperlipidemia: She is taking her medication daily. She is compliant with a low fat/cholesterol diet. She is not exercising regularly. She denies myalgias.   Increased flatulence:  She has had increased gas.  It does not matter if she is empty or full.  She has three BM's a day.  She denies pain.   It started about 4 months and she is not sure why.  She denies changed in medications.  She denies major changes in diet.  She has tried otc medications ( gas-ex, other gas pills, beano, probiotics) for the gas and if she takes enough it works.    Medications and allergies reviewed with patient and updated if appropriate.  Patient Active Problem List   Diagnosis Date Noted  . Thyromegaly 08/10/2016  . Diabetes ([place]) 08/08/2015  . Spinal stenosis, lumbar region, with neurogenic claudication 02/19/2015  . Osteoporosis 03/30/2013  . History of colonic polyps 11/15/2012  . Diverticulitis of sigmoid colon 10/05/2012  . Diverticulosis 10/19/2011  . Osteoarthritis 01/28/2010  . Hyperlipidemia 12/25/2007  . Essential hypertension 02/10/2007  . Breast cancer of upper-inner quadrant of left female breast ([place]) 05/02/2002    Current Outpatient Prescriptions on File Prior to  Visit  Medication Sig Dispense Refill  . Calcium Carbonate-Vitamin D (CALTRATE 600+D PO) Take 1 capsule by mouth daily.     Marland Kitchen glucose blood test strip Use as instructed to check blood sugars daily. 100 each 3  . Lancets (ONETOUCH ULTRASOFT) lancets Use as instructed to check blood sugars daily. DX R73.09 100 each 3  . losartan (COZAAR) 50 MG tablet TAKE 1 TABLET BY MOUTH  DAILY 90 tablet 3  . Multiple Vitamin (MULTIVITAMIN) tablet Take 1 tablet by mouth daily.      . Omega-3 Fatty Acids (FISH OIL) 1000 MG CAPS Take 350 mg by mouth daily.     Vladimir Faster Glycol-Propyl Glycol (SYSTANE) 0.4-0.3 % SOLN Place 1 drop into both eyes every morning.    . pravastatin (PRAVACHOL) 40 MG tablet TAKE 1 TABLET BY MOUTH  DAILY 90 tablet 3  . triamterene-hydrochlorothiazide (MAXZIDE) 75-50 MG tablet TAKE ONE-HALF TABLET BY  MOUTH DAILY 45 tablet 3  . vitamin E 400 UNIT capsule Take 400 Units by mouth daily.       No current facility-administered medications on file prior to visit.     Past Medical History:  Diagnosis Date  . Breast cancer ([place]) left  . Diabetes mellitus    TYPE 2  . Diverticulitis    09/14/12  . Diverticulosis   . Heart murmur   . History of radiation therapy   . History of urinary tract infection   . Hyperlipidemia   . Hypertension   . Osteoporosis  Fosamax Rxed 2/13  . Personal history of colonic adenomas 11/15/2012  . Pneumonia    2013  . Seasonal allergies   . Tingling    right leg     Past Surgical History:  Procedure Laterality Date  . ABDOMINAL HYSTERECTOMY  1965   ovaries remain; for dysfunctional menses; ? endometriosis  . ABDOMINAL SURGERY    . BREAST LUMPECTOMY Left   . colon polyps  2014   Dr Carlean Purl  . COLONOSCOPY  2004    Tics  . DILATION AND CURETTAGE OF UTERUS    . LAPAROSCOPIC SIGMOID COLECTOMY  04/13/2013   DR TOTH  . LAPAROSCOPIC SIGMOID COLECTOMY N/A 04/13/2013   Procedure: LAPAROSCOPIC ASSISTED SIGMOID COLECTOMY;  Surgeon: Merrie Roof, MD;   Location: [facility];  Service: General;  Laterality: N/A;  . LUMBAR LAMINECTOMY/DECOMPRESSION MICRODISCECTOMY N/A 02/19/2015   Procedure: MICRODISCECTOMY AT L4-L5, CENTRAL DECOMPRESSION L4-L5 FOR SPINAL STENOSIS, FORAMINOTOMY FOR L4 L5 ROOT ON RIGHT;  Surgeon: Latanya Maudlin, MD;  Location: [facility];  Service: Orthopedics;  Laterality: N/A;  . removal bone spur Left 2/14   2nd finger  . TONSILLECTOMY AND ADENOIDECTOMY      Social History   Social History  . Marital status: Widowed    Spouse name: N/A  . Number of children: N/A  . Years of education: N/A   Social History Main Topics  . Smoking status: Never Smoker  . Smokeless tobacco: Never Used  . Alcohol use No  . Drug use: No  . Sexual activity: Not on file   Other Topics Concern  . Not on file   Social History Narrative   Exercise: no    Family History  Problem Relation Age of Onset  . Stroke Mother 101  . Hypertension Mother   . Lung cancer Father        smoker  . Hypertension Sister   . Hypertension Brother   . Heart disease Brother 43  . Diabetes Neg Hx   . Colon cancer Neg Hx   . Stroke Brother 54  . Hypertension Brother     Review of Systems  Constitutional: Negative for chills and fever.  Respiratory: Negative for cough, shortness of breath and wheezing.   Cardiovascular: Negative for chest pain, palpitations and leg swelling.  Gastrointestinal: Negative for abdominal pain, blood in stool, constipation and diarrhea.  Musculoskeletal: Negative for myalgias.  Neurological: Negative for light-headedness and headaches.       Objective:   Vitals:   08/10/16 1101  BP: (!) 144/90  Pulse: 84  Resp: 16  Temp: 98 F (36.7 C)   Wt Readings from Last 3 Encounters:  08/10/16 162 lb (73.5 kg)  02/10/16 161 lb (73 kg)  02/06/16 162 lb 11.2 oz (73.8 kg)   Body mass index is 28.7 kg/m.   Physical Exam    Constitutional: Appears well-developed and well-nourished. No distress.  HENT:  Head: Normocephalic  and atraumatic.  Neck: Neck supple. No tracheal deviation present. thyromegaly present.  No cervical lymphadenopathy Cardiovascular: Normal rate, regular rhythm and normal heart sounds.   No murmur heard. No carotid bruit .  No edema Pulmonary/Chest: Effort normal and breath sounds normal. No respiratory distress. No has no wheezes. No rales.  Skin: Skin is warm and dry. Not diaphoretic.  Psychiatric: Normal mood and affect. Behavior is normal.    Diabetic Foot Exam - Simple   Simple Foot Form Diabetic Foot exam was performed with the following findings:  Yes   Visual  Inspection No deformities, no ulcerations, no other skin breakdown bilaterally:  Yes Sensation Testing Intact to touch and monofilament testing bilaterally:  Yes Pulse Check Posterior Tibialis and Dorsalis pulse intact bilaterally:  Yes Comments       Assessment & Plan:    See Problem List for Assessment and Plan of chronic medical problems.

## 2016-08-10 NOTE — Assessment & Plan Note (Signed)
BP high today Start checking regularly at home - goal < 140/90 Continue current medications at current doses cmp Increase exercise, weight loss

## 2016-08-10 NOTE — Patient Instructions (Addendum)
  Test(s) ordered today. Your results will be released to [facility] (or called to you) after review, usually within 72hours after test completion. If any changes need to be made, you will be notified at that same time.   Medications reviewed and updated.  No changes recommended at this time.  Your prescription(s) have been submitted to your pharmacy. Please take as directed and contact our office if you believe you are having problem(s) with the medication(s).  A thyroid US was ordered - someone will call you to schedule this.   Please followup in 6 months

## 2016-08-10 NOTE — Assessment & Plan Note (Signed)
Check [place] tsh today

## 2016-08-10 NOTE — Assessment & Plan Note (Addendum)
Check lipid panel (not fasting) Continue daily statin Regular exercise and healthy diet encouraged  

## 2016-08-11 ENCOUNTER — Ambulatory Visit
Admission: RE | Admit: 2016-08-11 | Discharge: 2016-08-11 | Disposition: A | Discharge status: Home / Self Care | Payer: Medicare Other | Source: Ambulatory Visit | Attending: Internal Medicine | Admitting: Internal Medicine

## 2016-08-11 DIAGNOSIS — E010 Iodine-deficiency related diffuse (endemic) goiter: Secondary | ICD-10-CM

## 2016-08-12 ENCOUNTER — Encounter: Payer: Self-pay | Admitting: Internal Medicine

## 2016-08-12 DIAGNOSIS — E042 Nontoxic multinodular goiter: Secondary | ICD-10-CM | POA: Insufficient documentation

## 2016-08-13 ENCOUNTER — Other Ambulatory Visit: Payer: Self-pay | Admitting: Internal Medicine

## 2016-08-13 DIAGNOSIS — E042 Nontoxic multinodular goiter: Secondary | ICD-10-CM

## 2016-09-13 ENCOUNTER — Ambulatory Visit
Admission: RE | Admit: 2016-09-13 | Discharge: 2016-09-13 | Disposition: A | Discharge status: Home / Self Care | Payer: Medicare Other | Source: Ambulatory Visit | Attending: Adult Health | Admitting: Adult Health

## 2016-09-13 DIAGNOSIS — Z1231 Encounter for screening mammogram for malignant neoplasm of breast: Secondary | ICD-10-CM

## 2016-09-13 HISTORY — DX: Personal history of irradiation: Z92.3

## 2016-09-15 ENCOUNTER — Other Ambulatory Visit: Payer: Self-pay | Admitting: Adult Health

## 2016-09-15 DIAGNOSIS — R928 Other abnormal and inconclusive findings on diagnostic imaging of breast: Secondary | ICD-10-CM

## 2016-09-20 DIAGNOSIS — J302 Other seasonal allergic rhinitis: Secondary | ICD-10-CM | POA: Insufficient documentation

## 2016-09-23 ENCOUNTER — Ambulatory Visit
Admission: RE | Admit: 2016-09-23 | Discharge: 2016-09-23 | Disposition: A | Discharge status: Home / Self Care | Payer: Medicare Other | Source: Ambulatory Visit | Attending: Adult Health | Admitting: Adult Health

## 2016-09-23 ENCOUNTER — Other Ambulatory Visit: Payer: Self-pay | Admitting: Otolaryngology

## 2016-09-23 DIAGNOSIS — R928 Other abnormal and inconclusive findings on diagnostic imaging of breast: Secondary | ICD-10-CM

## 2016-09-23 DIAGNOSIS — E042 Nontoxic multinodular goiter: Secondary | ICD-10-CM

## 2016-09-24 ENCOUNTER — Other Ambulatory Visit (HOSPITAL_COMMUNITY)
Admission: RE | Admit: 2016-09-24 | Discharge: 2016-09-24 | Disposition: A | Discharge status: Home / Self Care | Payer: Medicare Other | Source: Ambulatory Visit | Attending: General Surgery | Admitting: General Surgery

## 2016-09-24 ENCOUNTER — Ambulatory Visit
Admission: RE | Admit: 2016-09-24 | Discharge: 2016-09-24 | Disposition: A | Discharge status: Home / Self Care | Payer: Medicare Other | Source: Ambulatory Visit | Attending: Otolaryngology | Admitting: Otolaryngology

## 2016-09-24 DIAGNOSIS — E041 Nontoxic single thyroid nodule: Secondary | ICD-10-CM | POA: Insufficient documentation

## 2016-09-24 DIAGNOSIS — E042 Nontoxic multinodular goiter: Secondary | ICD-10-CM

## 2016-11-22 ENCOUNTER — Other Ambulatory Visit: Payer: Self-pay | Admitting: Internal Medicine

## 2016-12-13 ENCOUNTER — Ambulatory Visit (INDEPENDENT_AMBULATORY_CARE_PROVIDER_SITE_OTHER): Payer: Medicare Other | Admitting: Internal Medicine

## 2016-12-13 ENCOUNTER — Encounter: Payer: Self-pay | Admitting: Internal Medicine

## 2016-12-13 VITALS — BP 128/66 | HR 98 | Temp 98.0°F | Resp 16 | Wt 158.0 lb

## 2016-12-13 DIAGNOSIS — S90425A Blister (nonthermal), left lesser toe(s), initial encounter: Secondary | ICD-10-CM | POA: Diagnosis not present

## 2016-12-13 DIAGNOSIS — Z23 Encounter for immunization: Secondary | ICD-10-CM

## 2016-12-13 NOTE — Progress Notes (Signed)
Subjective:    Patient ID: Tiffany Hood, female    DOB: 1940-07-26, 76 y.o.   MRN: [id number]  HPI She is here for an acute visit.   Last wednesday she noticed a blister on her left second toe. She popped it last Thursday.  There has been clear fluid discharge from it since then.  The toe has been swollen, but no other toe has been swollen.  She denies redness.  Thee pain is minimal.  Last Wednesday she saw the dentist and was placed on amoxicillin for a dental infection, which she is still taking.  She has been cleaning the toe with soap and water, using H2O2 and triple antibotic ointment.  Recently she has been walking a lot and that toe has a hamer toe on that toe - she wondered if that was the cause of the blister - she did not have any pain while walking and was wearing socks.  She thinks it looks better today.  There is still clear fluid, which concerned her.  there is no pus and she denies fever.      Medications and allergies reviewed with patient and updated if appropriate.  Patient Active Problem List   Diagnosis Date Noted  . Multiple thyroid nodules 08/12/2016  . Thyromegaly 08/10/2016  . Diabetes ([place]) 08/08/2015  . Spinal stenosis, lumbar region, with neurogenic claudication 02/19/2015  . Osteoporosis 03/30/2013  . History of colonic polyps 11/15/2012  . Diverticulitis of sigmoid colon s/p sigmoidectomy 10/05/2012  . Diverticulosis 10/19/2011  . Osteoarthritis 01/28/2010  . Hyperlipidemia 12/25/2007  . Essential hypertension 02/10/2007  . Breast cancer of upper-inner quadrant of left female breast ([place]) 05/02/2002    Current Outpatient Prescriptions on File Prior to Visit  Medication Sig Dispense Refill  . aspirin EC 81 MG tablet Take 81 mg by mouth daily.    . Calcium Carbonate-Vitamin D (CALTRATE 600+D PO) Take 1 capsule by mouth daily.     . Lancets (ONETOUCH ULTRASOFT) lancets USE AS INSTRUCTED TO CHECK  BLOOD SUGARS DAILY. 100 each 3  . losartan (COZAAR) 50  MG tablet TAKE 1 TABLET BY MOUTH  DAILY 90 tablet 3  . metroNIDAZOLE (METROGEL) 0.75 % gel     . Multiple Vitamin (MULTIVITAMIN) tablet Take 1 tablet by mouth daily.      . Multiple Vitamins-Minerals (OCUVITE ADULT 50+ PO) Take by mouth daily.    . Omega-3 Fatty Acids (FISH OIL) 1000 MG CAPS Take 350 mg by mouth daily.     . ONE TOUCH ULTRA TEST test strip USE TO CHECK BLOOD SUGARS  DAILY 100 each 3  . Polyethyl Glycol-Propyl Glycol (SYSTANE) 0.4-0.3 % SOLN Place 1 drop into both eyes every morning.    . pravastatin (PRAVACHOL) 40 MG tablet TAKE 1 TABLET BY MOUTH  DAILY 90 tablet 3  . triamterene-hydrochlorothiazide (MAXZIDE) 75-50 MG tablet TAKE ONE-HALF TABLET BY  MOUTH DAILY 45 tablet 3  . vitamin E 400 UNIT capsule Take 400 Units by mouth daily.       No current facility-administered medications on file prior to visit.     Past Medical History:  Diagnosis Date  . Breast cancer ([place]) left  . Diabetes mellitus    TYPE 2  . Diverticulitis    09/14/12  . Diverticulosis   . Heart murmur   . History of radiation therapy   . History of urinary tract infection   . Hyperlipidemia   . Hypertension   . Osteoporosis  Fosamax Rxed 2/13  . Personal history of colonic adenomas 11/15/2012  . Personal history of radiation therapy 06/2002  . Pneumonia    2013  . Seasonal allergies   . Tingling    right leg     Past Surgical History:  Procedure Laterality Date  . ABDOMINAL HYSTERECTOMY  1965   ovaries remain; for dysfunctional menses; ? endometriosis  . ABDOMINAL SURGERY    . BREAST BIOPSY Left 04/16/2002   malignant  . BREAST LUMPECTOMY Left   . colon polyps  2014   Dr Carlean Purl  . COLONOSCOPY  2004    Tics  . DILATION AND CURETTAGE OF UTERUS    . LAPAROSCOPIC SIGMOID COLECTOMY  04/13/2013   DR TOTH  . LAPAROSCOPIC SIGMOID COLECTOMY N/A 04/13/2013   Procedure: LAPAROSCOPIC ASSISTED SIGMOID COLECTOMY;  Surgeon: Merrie Roof, MD;  Location: [facility];  Service: General;  Laterality:  N/A;  . LUMBAR LAMINECTOMY/DECOMPRESSION MICRODISCECTOMY N/A 02/19/2015   Procedure: MICRODISCECTOMY AT L4-L5, CENTRAL DECOMPRESSION L4-L5 FOR SPINAL STENOSIS, FORAMINOTOMY FOR L4 L5 ROOT ON RIGHT;  Surgeon: Latanya Maudlin, MD;  Location: [facility];  Service: Orthopedics;  Laterality: N/A;  . removal bone spur Left 2/14   2nd finger  . TONSILLECTOMY AND ADENOIDECTOMY      Social History   Social History  . Marital status: Widowed    Spouse name: N/A  . Number of children: N/A  . Years of education: N/A   Social History Main Topics  . Smoking status: Never Smoker  . Smokeless tobacco: Never Used  . Alcohol use No  . Drug use: No  . Sexual activity: Not on file   Other Topics Concern  . Not on file   Social History Narrative   Exercise: no    Family History  Problem Relation Age of Onset  . Stroke Mother 77  . Hypertension Mother   . Lung cancer Father        smoker  . Hypertension Brother   . Heart disease Brother 4  . Stroke Brother 31  . Hypertension Brother   . Hypertension Sister   . Diabetes Neg Hx   . Colon cancer Neg Hx     Review of Systems  Constitutional: Negative for chills and fever.  Cardiovascular: Negative for leg swelling.  Musculoskeletal: Negative for joint swelling.  Skin: Positive for wound. Negative for color change and rash.  Neurological: Negative for numbness.       Objective:   Vitals:   12/13/16 1010  BP: 128/66  Pulse: 98  Resp: 16  Temp: 98 F (36.7 C)  SpO2: 96%   Filed Weights   12/13/16 1010  Weight: 158 lb (71.7 kg)   Body mass index is 27.99 kg/m.  Wt Readings from Last 3 Encounters:  12/13/16 158 lb (71.7 kg)  08/10/16 162 lb (73.5 kg)  02/10/16 161 lb (73 kg)     Physical Exam  Constitutional: She appears well-developed and well-nourished. No distress.  Musculoskeletal: She exhibits no edema.  Skin: Skin is warm and dry. She is not diaphoretic. No erythema.  Left second toe is a hammertoe, blister is  distal to the deformity and there is minimal clear fluid discharge - minimal tenderness, no swelling or erythema proximally, normal sensation          Assessment & Plan:   See Problem List for Assessment and Plan of chronic medical problems.

## 2016-12-13 NOTE — Patient Instructions (Addendum)
Continue local wound care with washing with soap and water.  Apply neosporin.    Call if there is no improvement.    You had the flu vaccine today.

## 2016-12-13 NOTE — Assessment & Plan Note (Signed)
Clear fluid d/c, no pus, no concerning redness or swelling - no infection Blister may have been from walking and friction - it is hard to say  Continue local wound care Will finish amoxicillin from dentist  Call if the healing dose not continue

## 2017-02-01 ENCOUNTER — Ambulatory Visit: Payer: Medicare Other | Admitting: Adult Health

## 2017-02-01 ENCOUNTER — Telehealth: Payer: Self-pay | Admitting: Adult Health

## 2017-02-01 ENCOUNTER — Encounter: Payer: Self-pay | Admitting: Adult Health

## 2017-02-01 VITALS — BP 124/72 | HR 72 | Temp 98.0°F | Resp 17 | Ht 63.0 in | Wt 155.1 lb

## 2017-02-01 DIAGNOSIS — Z853 Personal history of malignant neoplasm of breast: Secondary | ICD-10-CM | POA: Diagnosis not present

## 2017-02-01 DIAGNOSIS — E2839 Other primary ovarian failure: Secondary | ICD-10-CM

## 2017-02-01 DIAGNOSIS — Z170 Estrogen receptor positive status [ER+]: Principal | ICD-10-CM

## 2017-02-01 DIAGNOSIS — C50212 Malignant neoplasm of upper-inner quadrant of left female breast: Secondary | ICD-10-CM

## 2017-02-01 DIAGNOSIS — Z1239 Encounter for other screening for malignant neoplasm of breast: Secondary | ICD-10-CM

## 2017-02-01 NOTE — Progress Notes (Signed)
CLINIC:  Survivorship   REASON FOR VISIT:  Routine follow-up for history of breast cancer.   BRIEF ONCOLOGIC HISTORY:  Per Dr. Geralyn Flash note from 01/30/2015: #1 T1c N0 breast cancer status post lumpectomy 05/07/2002. She went on to complete radiation therapy adjuvantly on 08/19/2012. #2 patient took Arimidex 1 mg daily from June 2004 to June 2014.   INTERVAL HISTORY:  Tiffany Hood presents to the Survivorship Clinic today for routine follow-up for her history of breast cancer.  Overall, she reports feeling quite well. She is doing well today.  She has had some issues with diverticulitis, her vision, and her tooth, however everything is resolving and is well managed at this point with her PCP.     REVIEW OF SYSTEMS:  Review of Systems  Constitutional: Negative for appetite change, chills, fatigue, fever and unexpected weight change.  HENT:   Negative for hearing loss and lump/mass.   Eyes: Negative for eye problems and icterus.  Respiratory: Negative for chest tightness, cough and shortness of breath.   Cardiovascular: Negative for chest pain, leg swelling and palpitations.  Gastrointestinal: Negative for abdominal distention, abdominal pain, constipation, nausea and vomiting.  Endocrine: Negative for hot flashes.  Skin: Negative for itching and rash.  Neurological: Negative for dizziness, extremity weakness, headaches and numbness.  Hematological: Negative for adenopathy. Does not bruise/bleed easily.  Psychiatric/Behavioral: Negative for depression. The patient is not nervous/anxious.   Breast: Denies any new nodularity, masses, tenderness, nipple changes, or nipple discharge.       PAST MEDICAL/SURGICAL HISTORY:  Past Medical History:  Diagnosis Date  . Breast cancer ([place]) left  . Diabetes mellitus    TYPE 2  . Diverticulitis    09/14/12  . Diverticulosis   . Heart murmur   . History of radiation therapy   . History of urinary tract infection   . Hyperlipidemia   .  Hypertension   . Osteoporosis    Fosamax Rxed 2/13  . Personal history of colonic adenomas 11/15/2012  . Personal history of radiation therapy 06/2002  . Pneumonia    2013  . Seasonal allergies   . Tingling    right leg    Past Surgical History:  Procedure Laterality Date  . ABDOMINAL HYSTERECTOMY  1965   ovaries remain; for dysfunctional menses; ? endometriosis  . ABDOMINAL SURGERY    . BREAST BIOPSY Left 04/16/2002   malignant  . BREAST LUMPECTOMY Left   . colon polyps  2014   Dr Carlean Purl  . COLONOSCOPY  2004    Tics  . DILATION AND CURETTAGE OF UTERUS    . LAPAROSCOPIC SIGMOID COLECTOMY  04/13/2013   DR TOTH  . removal bone spur Left 2/14   2nd finger  . TONSILLECTOMY AND ADENOIDECTOMY       ALLERGIES:  Allergies  Allergen Reactions  . Celestone [Betamethasone] Nausea Only    Sweated; face and chest with redness for several days      CURRENT MEDICATIONS:  Outpatient Encounter Medications as of 02/01/2017  Medication Sig Note  . aspirin EC 81 MG tablet Take 81 mg by mouth daily.   . Calcium Carbonate-Vitamin D (CALTRATE 600+D PO) Take 1 capsule by mouth daily.  02/18/2015: Pt has currently stopped taking   . Lancets (ONETOUCH ULTRASOFT) lancets USE AS INSTRUCTED TO CHECK  BLOOD SUGARS DAILY.   Marland Kitchen losartan (COZAAR) 50 MG tablet TAKE 1 TABLET BY MOUTH  DAILY   . metroNIDAZOLE (METROGEL) 0.75 % gel    . Multiple Vitamin (  MULTIVITAMIN) tablet Take 1 tablet by mouth daily.   02/18/2015: Pt has currently stopped taking   . Multiple Vitamins-Minerals (OCUVITE ADULT 50+ PO) Take by mouth daily.   . ONE TOUCH ULTRA TEST test strip USE TO CHECK BLOOD SUGARS  DAILY   . Polyethyl Glycol-Propyl Glycol (SYSTANE) 0.4-0.3 % SOLN Place 1 drop into both eyes every morning.   . pravastatin (PRAVACHOL) 40 MG tablet TAKE 1 TABLET BY MOUTH  DAILY   . triamterene-hydrochlorothiazide (MAXZIDE) 75-50 MG tablet TAKE ONE-HALF TABLET BY  MOUTH DAILY   . vitamin E 400 UNIT capsule Take 400  Units by mouth daily.   05/06/2015: Patient now taking  . Omega-3 Fatty Acids (FISH OIL) 1000 MG CAPS Take 350 mg by mouth daily.  02/18/2015: Pt has currently stopped taking    No facility-administered encounter medications on file as of 02/01/2017.      ONCOLOGIC FAMILY HISTORY:  Family History  Problem Relation Age of Onset  . Stroke Mother 58  . Hypertension Mother   . Lung cancer Father        smoker  . Hypertension Brother   . Heart disease Brother 38  . Stroke Brother 70  . Hypertension Brother   . Hypertension Sister   . Diabetes Neg Hx   . Colon cancer Neg Hx     GENETIC COUNSELING/TESTING: Not indicated  SOCIAL HISTORY:  Tiffany Hood is widowed and lives alone in [place], [place].  She has 1 son who lives in [place], [place].  Tiffany Hood is currently retired.  She denies any current or history of tobacco, alcohol, or illicit drug use.     PHYSICAL EXAMINATION:  Vital Signs: Vitals:   02/01/17 1500  BP: 124/72  Pulse: 72  Resp: 17  Temp: 98 F (36.7 C)  SpO2: 99%   Filed Weights   02/01/17 1500  Weight: 155 lb 1.6 oz (70.4 kg)   General: Well-nourished, well-appearing female in no acute distress.  Unaccompanied today.   HEENT: Head is normocephalic.  Pupils equal and reactive to light. Conjunctivae clear without exudate.  Sclerae anicteric. Oral mucosa is pink, moist.  Oropharynx is pink without lesions or erythema.  Lymph: No cervical, supraclavicular, or infraclavicular lymphadenopathy noted on palpation.  Cardiovascular: Regular rate and rhythm.Marland Kitchen Respiratory: Clear to auscultation bilaterally. Chest expansion symmetric; breathing non-labored.  Breast Exam:  -Left breast: No appreciable masses on palpation. No skin redness, thickening, or peau d'orange appearance; no nipple retraction or nipple discharge; mild distortion in symmetry at previous lumpectomy site well healed scar without erythema or nodularity.  -Right breast: No appreciable masses  on palpation. No skin redness, thickening, or peau d'orange appearance; no nipple retraction or nipple discharge; -Axilla: No axillary adenopathy bilaterally.  GI: Abdomen soft and round; non-tender, non-distended. Bowel sounds normoactive. No hepatosplenomegaly.   GU: Deferred.  Neuro: No focal deficits. Steady gait.  Psych: Mood and affect normal and appropriate for situation.  MSK: No focal spinal tenderness to palpation, full range of motion in bilateral upper extremities Extremities: No edema. Skin: Warm and dry.  LABORATORY DATA:  None for this visit   DIAGNOSTIC IMAGING:  Most recent mammogram:      ASSESSMENT AND PLAN:  Ms.. Hood is a pleasant 76 y.o. 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  She presents to the Survivorship Clinic for surveillance  and routine follow-up.   1. History of breast cancer:  Tiffany Hood is currently clinically and radiographically without evidence of disease or recurrence of breast cancer. She will be due for mammogram in 08/2017; orders placed today.   I encouraged her to call me with any questions or concerns before her next visit at the cancer center, and I would be happy to see her sooner, if needed.    2. Bone health:  Given Tiffany Hood's age, history of breast cancer, and her previous anti-estrogen therapy with Anastrozole, she is at risk for bone demineralization. Her last DEXA scan was on 08/2015 and was consistent with osteopenia with a T score of -1.2 in the left forearm.  She will be due for repeat bone density in 08/2017; orders placed today.  In the meantime, she was encouraged to increase her consumption of foods rich in calcium, as well as increase her weight-bearing activities.  She was given education on specific food and activities to promote bone  health.  3. Cancer screening:  Due to Tiffany Hood's history and her age, she should receive screening for skin cancers, colon cancer. She was encouraged to follow-up with her PCP for appropriate cancer screenings.   4. Health maintenance and wellness promotion: Tiffany Hood was encouraged to consume 5-7 servings of fruits and vegetables per day. She was also encouraged to engage in moderate to vigorous exercise for 30 minutes per day most days of the week. She was instructed to limit her alcohol consumption and continue to abstain from tobacco use.     Dispo:  -Return to cancer center in one year for LTS follow up -Mammogram and bone density in 08/2017   A total of (30) minutes of face-to-face time was spent with this patient with greater than 50% of that time in counseling and care-coordination.   Gardenia Phlegm, NP Survivorship Program [facility] [phone]   Note: PRIMARY CARE PROVIDER Binnie Rail, [place] [phone]

## 2017-02-01 NOTE — Telephone Encounter (Signed)
Scheduled appt per 11/6 los - patient did not want AVS and calender printed - patient is aware of appt time and date for next appt

## 2017-02-04 ENCOUNTER — Encounter: Payer: Medicare Other | Admitting: Adult Health

## 2017-02-07 DIAGNOSIS — H40023 Open angle with borderline findings, high risk, bilateral: Secondary | ICD-10-CM | POA: Diagnosis not present

## 2017-02-07 DIAGNOSIS — H25013 Cortical age-related cataract, bilateral: Secondary | ICD-10-CM | POA: Diagnosis not present

## 2017-02-07 DIAGNOSIS — H40003 Preglaucoma, unspecified, bilateral: Secondary | ICD-10-CM | POA: Diagnosis not present

## 2017-02-07 DIAGNOSIS — H3589 Other specified retinal disorders: Secondary | ICD-10-CM | POA: Diagnosis not present

## 2017-02-07 DIAGNOSIS — H2513 Age-related nuclear cataract, bilateral: Secondary | ICD-10-CM | POA: Diagnosis not present

## 2017-02-10 ENCOUNTER — Ambulatory Visit: Payer: Medicare Other | Admitting: Internal Medicine

## 2017-02-10 NOTE — Progress Notes (Signed)
Subjective:    Patient ID: Tiffany Hood, female    DOB: Dec 05, 1940, 76 y.o.   MRN: [id number]  HPI The patient is here for follow up.  Hypertension: She is taking her medication daily. She is compliant with a low sodium diet.  She denies chest pain, palpitations, edema, shortness of breath and regular headaches. She is doing some walking.  She needs to do more.  She does not monitor her blood pressure at home.    Diabetes: She is controlling her sugars with diet. She is compliant with a diabetic diet. She is doing some walking.  She monitors her sugars and they have been running < 130. She checks her feet daily and denies foot lesions. She is up-to-date with an ophthalmology examination.   Hyperlipidemia: She is taking her medication daily. She is compliant with a low fat/cholesterol diet. She is doing some walking.  She denies myalgias.   She has had a lot of gas and diarrhea.  She has started taking pepto bismol 1/2 pill daily.  She is still going twice a day and it is solid stool.  She denies any abdominal pain.  Medications and allergies reviewed with patient and updated if appropriate.  Patient Active Problem List   Diagnosis Date Noted  . Blister of toe of left foot without infection 12/13/2016  . Multiple thyroid nodules 08/12/2016  . Thyromegaly 08/10/2016  . Diabetes ([place]) 08/08/2015  . Spinal stenosis, lumbar region, with neurogenic claudication 02/19/2015  . Osteoporosis 03/30/2013  . History of colonic polyps 11/15/2012  . Diverticulitis of sigmoid colon s/p sigmoidectomy 10/05/2012  . Diverticulosis 10/19/2011  . Osteoarthritis 01/28/2010  . Hyperlipidemia 12/25/2007  . Essential hypertension 02/10/2007  . Breast cancer of upper-inner quadrant of left female breast ([place]) 05/02/2002    Current Outpatient Medications on File Prior to Visit  Medication Sig Dispense Refill  . aspirin EC 81 MG tablet Take 81 mg by mouth daily.    . Calcium Carbonate-Vitamin D  (CALTRATE 600+D PO) Take 1 capsule by mouth daily.     . Lancets (ONETOUCH ULTRASOFT) lancets USE AS INSTRUCTED TO CHECK  BLOOD SUGARS DAILY. 100 each 3  . losartan (COZAAR) 50 MG tablet TAKE 1 TABLET BY MOUTH  DAILY 90 tablet 3  . metroNIDAZOLE (METROGEL) 0.75 % gel     . Multiple Vitamins-Minerals (OCUVITE ADULT 50+ PO) Take by mouth daily.    . ONE TOUCH ULTRA TEST test strip USE TO CHECK BLOOD SUGARS  DAILY 100 each 3  . Polyethyl Glycol-Propyl Glycol (SYSTANE) 0.4-0.3 % SOLN Place 1 drop into both eyes every morning.    . pravastatin (PRAVACHOL) 40 MG tablet TAKE 1 TABLET BY MOUTH  DAILY 90 tablet 3  . triamterene-hydrochlorothiazide (MAXZIDE) 75-50 MG tablet TAKE ONE-HALF TABLET BY  MOUTH DAILY 45 tablet 3   No current facility-administered medications on file prior to visit.     Past Medical History:  Diagnosis Date  . Breast cancer ([place]) left  . Diabetes mellitus    TYPE 2  . Diverticulitis    09/14/12  . Diverticulosis   . Heart murmur   . History of radiation therapy   . History of urinary tract infection   . Hyperlipidemia   . Hypertension   . Osteoporosis    Fosamax Rxed 2/13  . Personal history of colonic adenomas 11/15/2012  . Personal history of radiation therapy 06/2002  . Pneumonia    2013  . Seasonal allergies   . Tingling  right leg     Past Surgical History:  Procedure Laterality Date  . ABDOMINAL HYSTERECTOMY  1965   ovaries remain; for dysfunctional menses; ? endometriosis  . ABDOMINAL SURGERY    . BREAST BIOPSY Left 04/16/2002   malignant  . BREAST LUMPECTOMY Left   . colon polyps  2014   Dr Carlean Purl  . COLONOSCOPY  2004    Tics  . DILATION AND CURETTAGE OF UTERUS    . LAPAROSCOPIC ASSISTED SIGMOID COLECTOMY N/A 04/13/2013   Performed by Jovita Kussmaul, MD at [place]  . LAPAROSCOPIC SIGMOID COLECTOMY  04/13/2013   DR TOTH  . MICRODISCECTOMY AT L4-L5, CENTRAL DECOMPRESSION L4-L5 FOR SPINAL STENOSIS, FORAMINOTOMY FOR L4 L5 ROOT ON RIGHT N/A  02/19/2015   Performed by Latanya Maudlin, MD at [facility]  . removal bone spur Left 2/14   2nd finger  . TONSILLECTOMY AND ADENOIDECTOMY      Social History   Socioeconomic History  . Marital status: Widowed    Spouse name: None  . Number of children: None  . Years of education: None  . Highest education level: None  Social Needs  . Financial resource strain: None  . Food insecurity - worry: None  . Food insecurity - inability: None  . Transportation needs - medical: None  . Transportation needs - non-medical: None  Occupational History  . None  Tobacco Use  . Smoking status: Never Smoker  . Smokeless tobacco: Never Used  Substance and Sexual Activity  . Alcohol use: No    Alcohol/week: 0.0 oz  . Drug use: No  . Sexual activity: None  Other Topics Concern  . None  Social History Narrative   Exercise: no    Family History  Problem Relation Age of Onset  . Stroke Mother 77  . Hypertension Mother   . Lung cancer Father        smoker  . Hypertension Brother   . Heart disease Brother 64  . Stroke Brother 52  . Hypertension Brother   . Hypertension Sister   . Diabetes Neg Hx   . Colon cancer Neg Hx     Review of Systems  Constitutional: Negative for chills and fever.  Respiratory: Negative for cough, shortness of breath and wheezing.   Cardiovascular: Negative for chest pain, palpitations and leg swelling.  Neurological: Negative for dizziness, light-headedness and headaches.       Objective:   Vitals:   02/11/17 1123  BP: 134/76  Pulse: 74  Resp: 16  Temp: 97.7 F (36.5 C)  SpO2: 97%   Wt Readings from Last 3 Encounters:  02/11/17 155 lb (70.3 kg)  02/01/17 155 lb 1.6 oz (70.4 kg)  12/13/16 158 lb (71.7 kg)   Body mass index is 27.46 kg/m.   Physical Exam    Constitutional: Appears well-developed and well-nourished. No distress.  HENT:  Head: Normocephalic and atraumatic.  Neck: Neck supple. No tracheal deviation present. No thyromegaly  present.  No cervical lymphadenopathy Cardiovascular: Normal rate, regular rhythm and normal heart sounds.   No murmur heard. No carotid bruit .  No edema Pulmonary/Chest: Effort normal and breath sounds normal. No respiratory distress. No has no wheezes. No rales.  Skin: Skin is warm and dry. Not diaphoretic.  Psychiatric: Normal mood and affect. Behavior is normal.      Assessment & Plan:    See Problem List for Assessment and Plan of chronic medical problems.

## 2017-02-10 NOTE — Patient Instructions (Addendum)
  Test(s) ordered today. Your results will be released to MyChart (or called to you) after review, usually within 72hours after test completion. If any changes need to be made, you will be notified at that same time.  No immunizations administered today.   Medications reviewed and updated.  No changes recommended at this time.  Please followup in 6 months   

## 2017-02-11 ENCOUNTER — Encounter: Payer: Self-pay | Admitting: Internal Medicine

## 2017-02-11 ENCOUNTER — Other Ambulatory Visit (INDEPENDENT_AMBULATORY_CARE_PROVIDER_SITE_OTHER): Payer: Medicare Other

## 2017-02-11 ENCOUNTER — Ambulatory Visit (INDEPENDENT_AMBULATORY_CARE_PROVIDER_SITE_OTHER): Payer: Medicare Other | Admitting: Internal Medicine

## 2017-02-11 VITALS — BP 134/76 | HR 74 | Temp 97.7°F | Resp 16 | Wt 155.0 lb

## 2017-02-11 DIAGNOSIS — E119 Type 2 diabetes mellitus without complications: Secondary | ICD-10-CM | POA: Diagnosis not present

## 2017-02-11 DIAGNOSIS — E7849 Other hyperlipidemia: Secondary | ICD-10-CM | POA: Diagnosis not present

## 2017-02-11 DIAGNOSIS — I10 Essential (primary) hypertension: Secondary | ICD-10-CM

## 2017-02-11 LAB — LIPID PANEL
CHOL/HDL RATIO: 3
CHOLESTEROL: 171 mg/dL (ref 0–200)
HDL: 59.7 mg/dL (ref >39.00)
NonHDL: 111.77
TRIGLYCERIDES: 227 mg/dL — AB (ref 0.0–149.0)
VLDL: 45.4 mg/dL — ABNORMAL HIGH (ref 0.0–40.0)

## 2017-02-11 LAB — COMPREHENSIVE METABOLIC PANEL
ALBUMIN: 4.3 g/dL (ref 3.5–5.2)
ALT: 15 U/L (ref 0–35)
AST: 17 U/L (ref 0–37)
Alkaline Phosphatase: 61 U/L (ref 39–117)
BUN: 18 mg/dL (ref 6–23)
CHLORIDE: 101 meq/L (ref 96–112)
CO2: 29 meq/L (ref 19–32)
CREATININE: 0.64 mg/dL (ref 0.40–1.20)
Calcium: 9.6 mg/dL (ref 8.4–10.5)
GFR: 96.17 mL/min (ref >60.00)
Glucose, Bld: 98 mg/dL (ref 70–99)
Potassium: 3.8 mEq/L (ref 3.5–5.1)
SODIUM: 138 meq/L (ref 135–145)
Total Bilirubin: 0.4 mg/dL (ref 0.2–1.2)
Total Protein: 7.2 g/dL (ref 6.0–8.3)

## 2017-02-11 LAB — HEMOGLOBIN A1C: Hgb A1c MFr Bld: 6.4 % (ref 4.6–6.5)

## 2017-02-11 LAB — LDL CHOLESTEROL, DIRECT: LDL DIRECT: 78 mg/dL

## 2017-02-11 NOTE — Assessment & Plan Note (Signed)
Diet controlled Check a1c Low sugar / carb diet Stressed regular exercise  

## 2017-02-11 NOTE — Assessment & Plan Note (Signed)
Check lipid panel  Continue daily statin Regular exercise and healthy diet encouraged  

## 2017-02-11 NOTE — Assessment & Plan Note (Signed)
BP well controlled Current regimen effective and well tolerated Continue current medications at current doses cmp  

## 2017-02-12 ENCOUNTER — Encounter: Payer: Self-pay | Admitting: Internal Medicine

## 2017-02-21 ENCOUNTER — Other Ambulatory Visit: Payer: Self-pay | Admitting: Internal Medicine

## 2017-03-07 ENCOUNTER — Other Ambulatory Visit: Payer: Self-pay | Admitting: Internal Medicine

## 2017-05-11 DIAGNOSIS — L820 Inflamed seborrheic keratosis: Secondary | ICD-10-CM | POA: Diagnosis not present

## 2017-05-11 DIAGNOSIS — L821 Other seborrheic keratosis: Secondary | ICD-10-CM | POA: Diagnosis not present

## 2017-05-11 DIAGNOSIS — D234 Other benign neoplasm of skin of scalp and neck: Secondary | ICD-10-CM | POA: Diagnosis not present

## 2017-05-18 ENCOUNTER — Telehealth: Payer: Self-pay | Admitting: Internal Medicine

## 2017-05-18 NOTE — Telephone Encounter (Signed)
Patient declined AWV at this time. Mrs. Riviere stated that she will talk with Dr. Quay Burow at her 6 month f/u in May 2019 to see if she still needs to complete the Wellness visit.

## 2017-08-10 DIAGNOSIS — L821 Other seborrheic keratosis: Secondary | ICD-10-CM | POA: Diagnosis not present

## 2017-08-10 DIAGNOSIS — M79671 Pain in right foot: Secondary | ICD-10-CM | POA: Diagnosis not present

## 2017-08-10 DIAGNOSIS — L570 Actinic keratosis: Secondary | ICD-10-CM | POA: Diagnosis not present

## 2017-08-11 ENCOUNTER — Ambulatory Visit (INDEPENDENT_AMBULATORY_CARE_PROVIDER_SITE_OTHER): Payer: Medicare Other | Admitting: Internal Medicine

## 2017-08-11 ENCOUNTER — Other Ambulatory Visit (INDEPENDENT_AMBULATORY_CARE_PROVIDER_SITE_OTHER): Payer: Medicare Other

## 2017-08-11 ENCOUNTER — Encounter: Payer: Self-pay | Admitting: Internal Medicine

## 2017-08-11 VITALS — BP 144/76 | HR 79 | Temp 98.5°F | Resp 16 | Wt 162.0 lb

## 2017-08-11 DIAGNOSIS — E119 Type 2 diabetes mellitus without complications: Secondary | ICD-10-CM

## 2017-08-11 DIAGNOSIS — E042 Nontoxic multinodular goiter: Secondary | ICD-10-CM | POA: Diagnosis not present

## 2017-08-11 DIAGNOSIS — E7849 Other hyperlipidemia: Secondary | ICD-10-CM

## 2017-08-11 DIAGNOSIS — I10 Essential (primary) hypertension: Secondary | ICD-10-CM

## 2017-08-11 LAB — COMPREHENSIVE METABOLIC PANEL
ALT: 19 U/L (ref 0–35)
AST: 17 U/L (ref 0–37)
Albumin: 4.3 g/dL (ref 3.5–5.2)
Alkaline Phosphatase: 56 U/L (ref 39–117)
BUN: 23 mg/dL (ref 6–23)
CHLORIDE: 99 meq/L (ref 96–112)
CO2: 30 mEq/L (ref 19–32)
CREATININE: 0.7 mg/dL (ref 0.40–1.20)
Calcium: 10.3 mg/dL (ref 8.4–10.5)
GFR: 86.6 mL/min (ref >60.00)
GLUCOSE: 91 mg/dL (ref 70–99)
Potassium: 4.3 mEq/L (ref 3.5–5.1)
SODIUM: 137 meq/L (ref 135–145)
Total Bilirubin: 0.4 mg/dL (ref 0.2–1.2)
Total Protein: 7.5 g/dL (ref 6.0–8.3)

## 2017-08-11 LAB — CBC WITH DIFFERENTIAL/PLATELET
BASOS PCT: 0.6 % (ref 0.0–3.0)
Basophils Absolute: 0 10*3/uL (ref 0.0–0.1)
Eosinophils Absolute: 0.5 10*3/uL (ref 0.0–0.7)
Eosinophils Relative: 6.7 % — ABNORMAL HIGH (ref 0.0–5.0)
HEMATOCRIT: 40.7 % (ref 36.0–46.0)
Hemoglobin: 13.8 g/dL (ref 12.0–15.0)
LYMPHS ABS: 2.2 10*3/uL (ref 0.7–4.0)
LYMPHS PCT: 28.5 % (ref 12.0–46.0)
MCHC: 34 g/dL (ref 30.0–36.0)
MCV: 86.9 fl (ref 78.0–100.0)
MONOS PCT: 9.3 % (ref 3.0–12.0)
Monocytes Absolute: 0.7 10*3/uL (ref 0.1–1.0)
NEUTROS ABS: 4.2 10*3/uL (ref 1.4–7.7)
NEUTROS PCT: 54.9 % (ref 43.0–77.0)
PLATELETS: 234 10*3/uL (ref 150.0–400.0)
RBC: 4.69 Mil/uL (ref 3.87–5.11)
RDW: 14.7 % (ref 11.5–15.5)
WBC: 7.6 10*3/uL (ref 4.0–10.5)

## 2017-08-11 LAB — LIPID PANEL
CHOL/HDL RATIO: 3
Cholesterol: 196 mg/dL (ref 0–200)
HDL: 65 mg/dL (ref >39.00)
NONHDL: 130.68
Triglycerides: 381 mg/dL — ABNORMAL HIGH (ref 0.0–149.0)
VLDL: 76.2 mg/dL — ABNORMAL HIGH (ref 0.0–40.0)

## 2017-08-11 LAB — LDL CHOLESTEROL, DIRECT: Direct LDL: 84 mg/dL

## 2017-08-11 LAB — HEMOGLOBIN A1C: Hgb A1c MFr Bld: 6.6 % — ABNORMAL HIGH (ref 4.6–6.5)

## 2017-08-11 LAB — TSH: TSH: 1.38 u[IU]/mL (ref 0.35–4.50)

## 2017-08-11 NOTE — Assessment & Plan Note (Signed)
bp slightly elevated today - may be from naprosyn Has been well controlled, will just monitor No change in medications cmp

## 2017-08-11 NOTE — Patient Instructions (Addendum)
  Test(s) ordered today. Your results will be released to [place] (or called to you) after review, usually within 72hours after test completion. If any changes need to be made, you will be notified at that same time.   Medications reviewed and updated.  No changes recommended at this time.   A thyroid ultrasound was ordered - they will call you to schedule this.    Please followup in 6 months

## 2017-08-11 NOTE — Assessment & Plan Note (Signed)
Diet controlled Check a1c Low sugar / carb diet Stressed regular exercise  

## 2017-08-11 NOTE — Assessment & Plan Note (Signed)
[place] due - ordered tsh Will consider referral back to ENT depending on [place] results

## 2017-08-11 NOTE — Progress Notes (Signed)
Subjective:    Patient ID: Tiffany Hood, female    DOB: 1941/03/24, 77 y.o.   MRN: [id number]  HPI The patient is here for follow up.  She is exercising-walking, but is temporarily not able to walk due to right foot pain.  She is taking naproxen for her foot.   Hypertension: She is taking her medication daily. She just started taking naprosyn yesterday.  She is compliant with a low sodium diet.  She denies chest pain, palpitations, edema, shortness of breath and regular headaches.   She does not monitor her blood pressure at home.    Diabetes: She is taking her medication daily as prescribed. She is compliant with a diabetic diet.   She is up-to-date with an ophthalmology examination.   Hyperlipidemia: She is taking her medication daily. She is compliant with a low fat/cholesterol diet.  She denies myalgias.   Thyroid nodules: Her last thyroid ultrasound was 1 year ago and follow-up was recommended.  She denies any obvious changes in her thyroid.  She denies difficulty swallowing.  She denies major changes in her energy level or weight that does not make sense.   Medications and allergies reviewed with patient and updated if appropriate.  Patient Active Problem List   Diagnosis Date Noted  . Blister of toe of left foot without infection 12/13/2016  . Multiple thyroid nodules 08/12/2016  . Thyromegaly 08/10/2016  . Diabetes ([place]) 08/08/2015  . Spinal stenosis, lumbar region, with neurogenic claudication 02/19/2015  . Osteoporosis 03/30/2013  . History of colonic polyps 11/15/2012  . Diverticulitis of sigmoid colon s/p sigmoidectomy 10/05/2012  . Diverticulosis 10/19/2011  . Osteoarthritis 01/28/2010  . Hyperlipidemia 12/25/2007  . Essential hypertension 02/10/2007  . Breast cancer of upper-inner quadrant of left female breast ([place]) 05/02/2002    Current Outpatient Medications on File Prior to Visit  Medication Sig Dispense Refill  . aspirin EC 81 MG tablet Take 81 mg by  mouth daily.    . Calcium Carbonate-Vitamin D (CALTRATE 600+D PO) Take 1 capsule by mouth daily.     . Lancets (ONETOUCH ULTRASOFT) lancets USE AS INSTRUCTED TO CHECK  BLOOD SUGARS DAILY. 100 each 3  . losartan (COZAAR) 50 MG tablet TAKE 1 TABLET BY MOUTH  DAILY 90 tablet 3  . metroNIDAZOLE (METROGEL) 0.75 % gel     . Multiple Vitamins-Minerals (OCUVITE ADULT 50+ PO) Take by mouth daily.    . ONE TOUCH ULTRA TEST test strip USE TO CHECK BLOOD SUGARS  DAILY 100 each 3  . Polyethyl Glycol-Propyl Glycol (SYSTANE) 0.4-0.3 % SOLN Place 1 drop into both eyes every morning.    . pravastatin (PRAVACHOL) 40 MG tablet TAKE 1 TABLET BY MOUTH  DAILY 90 tablet 3  . triamterene-hydrochlorothiazide (MAXZIDE) 75-50 MG tablet TAKE ONE-HALF TABLET BY  MOUTH DAILY 45 tablet 3  . naproxen (NAPROSYN) 500 MG tablet Take 500 mg by mouth 2 (two) times daily.     No current facility-administered medications on file prior to visit.     Past Medical History:  Diagnosis Date  . Breast cancer ([place]) left  . Diabetes mellitus    TYPE 2  . Diverticulitis    09/14/12  . Diverticulosis   . Heart murmur   . History of radiation therapy   . History of urinary tract infection   . Hyperlipidemia   . Hypertension   . Osteoporosis    Fosamax Rxed 2/13  . Personal history of colonic adenomas 11/15/2012  . Personal history  of radiation therapy 06/2002  . Pneumonia    2013  . Seasonal allergies   . Tingling    right leg     Past Surgical History:  Procedure Laterality Date  . ABDOMINAL HYSTERECTOMY  1965   ovaries remain; for dysfunctional menses; ? endometriosis  . ABDOMINAL SURGERY    . BREAST BIOPSY Left 04/16/2002   malignant  . BREAST LUMPECTOMY Left   . colon polyps  2014   Dr Carlean Purl  . COLONOSCOPY  2004    Tics  . DILATION AND CURETTAGE OF UTERUS    . LAPAROSCOPIC SIGMOID COLECTOMY  04/13/2013   DR TOTH  . LAPAROSCOPIC SIGMOID COLECTOMY N/A 04/13/2013   Procedure: LAPAROSCOPIC ASSISTED SIGMOID  COLECTOMY;  Surgeon: Merrie Roof, MD;  Location: [facility];  Service: General;  Laterality: N/A;  . LUMBAR LAMINECTOMY/DECOMPRESSION MICRODISCECTOMY N/A 02/19/2015   Procedure: MICRODISCECTOMY AT L4-L5, CENTRAL DECOMPRESSION L4-L5 FOR SPINAL STENOSIS, FORAMINOTOMY FOR L4 L5 ROOT ON RIGHT;  Surgeon: Latanya Maudlin, MD;  Location: [facility];  Service: Orthopedics;  Laterality: N/A;  . removal bone spur Left 2/14   2nd finger  . TONSILLECTOMY AND ADENOIDECTOMY      Social History   Socioeconomic History  . Marital status: Widowed    Spouse name: Not on file  . Number of children: Not on file  . Years of education: Not on file  . Highest education level: Not on file  Occupational History  . Not on file  Social Needs  . Financial resource strain: Not on file  . Food insecurity:    Worry: Not on file    Inability: Not on file  . Transportation needs:    Medical: Not on file    Non-medical: Not on file  Tobacco Use  . Smoking status: Never Smoker  . Smokeless tobacco: Never Used  Substance and Sexual Activity  . Alcohol use: No    Alcohol/week: 0.0 oz  . Drug use: No  . Sexual activity: Not on file  Lifestyle  . Physical activity:    Days per week: Not on file    Minutes per session: Not on file  . Stress: Not on file  Relationships  . Social connections:    Talks on phone: Not on file    Gets together: Not on file    Attends religious service: Not on file    Active member of club or organization: Not on file    Attends meetings of clubs or organizations: Not on file    Relationship status: Not on file  Other Topics Concern  . Not on file  Social History Narrative   Exercise: no    Family History  Problem Relation Age of Onset  . Stroke Mother 27  . Hypertension Mother   . Lung cancer Father        smoker  . Hypertension Brother   . Heart disease Brother 67  . Stroke Brother 47  . Hypertension Brother   . Hypertension Sister   . Diabetes Neg Hx   . Colon cancer  Neg Hx     Review of Systems  Constitutional: Negative for chills, fatigue and fever.  Respiratory: Negative for cough, shortness of breath and wheezing.   Cardiovascular: Negative for chest pain, palpitations and leg swelling.  Neurological: Negative for light-headedness and headaches.       Objective:   Vitals:   08/11/17 1109  BP: (!) 144/76  Pulse: 79  Resp: 16  Temp: 98.5 F (  36.9 C)  SpO2: 95%   BP Readings from Last 3 Encounters:  08/11/17 (!) 144/76  02/11/17 134/76  02/01/17 124/72   Wt Readings from Last 3 Encounters:  08/11/17 162 lb (73.5 kg)  02/11/17 155 lb (70.3 kg)  02/01/17 155 lb 1.6 oz (70.4 kg)   Body mass index is 28.7 kg/m.   Physical Exam    Constitutional: Appears well-developed and well-nourished. No distress.  HENT:  Head: Normocephalic and atraumatic.  Neck: Neck supple. No tracheal deviation present. No thyromegaly present.  No cervical lymphadenopathy Cardiovascular: Normal rate, regular rhythm and normal heart sounds.   No murmur heard. No carotid bruit .  No edema Pulmonary/Chest: Effort normal and breath sounds normal. No respiratory distress. No has no wheezes. No rales.  Skin: Skin is warm and dry. Not diaphoretic.  Psychiatric: Normal mood and affect. Behavior is normal.    Diabetic Foot Exam - Simple   Simple Foot Form Diabetic Foot exam was performed with the following findings:  Yes 08/11/2017 11:39 AM  Visual Inspection No deformities, no ulcerations, no other skin breakdown bilaterally:  Yes Sensation Testing Intact to touch and monofilament testing bilaterally:  Yes Pulse Check Posterior Tibialis and Dorsalis pulse intact bilaterally:  Yes Comments      Assessment & Plan:    See Problem List for Assessment and Plan of chronic medical problems.

## 2017-08-11 NOTE — Assessment & Plan Note (Signed)
Check lipid panel  Continue daily statin Regular exercise and healthy diet encouraged  

## 2017-08-12 ENCOUNTER — Encounter: Payer: Self-pay | Admitting: Internal Medicine

## 2017-08-29 ENCOUNTER — Ambulatory Visit
Admission: RE | Admit: 2017-08-29 | Discharge: 2017-08-29 | Disposition: A | Discharge status: Home / Self Care | Payer: Medicare Other | Source: Ambulatory Visit | Attending: Internal Medicine | Admitting: Internal Medicine

## 2017-08-29 DIAGNOSIS — E041 Nontoxic single thyroid nodule: Secondary | ICD-10-CM | POA: Diagnosis not present

## 2017-08-29 DIAGNOSIS — E042 Nontoxic multinodular goiter: Secondary | ICD-10-CM

## 2017-08-31 ENCOUNTER — Encounter: Payer: Self-pay | Admitting: Internal Medicine

## 2017-09-01 DIAGNOSIS — M79671 Pain in right foot: Secondary | ICD-10-CM | POA: Diagnosis not present

## 2017-09-12 ENCOUNTER — Telehealth: Payer: Self-pay

## 2017-09-12 ENCOUNTER — Encounter: Payer: Self-pay | Admitting: Internal Medicine

## 2017-09-12 ENCOUNTER — Ambulatory Visit
Admission: RE | Admit: 2017-09-12 | Discharge: 2017-09-12 | Disposition: A | Discharge status: Home / Self Care | Payer: Medicare Other | Source: Ambulatory Visit | Attending: Adult Health | Admitting: Adult Health

## 2017-09-12 DIAGNOSIS — M85832 Other specified disorders of bone density and structure, left forearm: Secondary | ICD-10-CM | POA: Diagnosis not present

## 2017-09-12 DIAGNOSIS — Z1231 Encounter for screening mammogram for malignant neoplasm of breast: Secondary | ICD-10-CM | POA: Diagnosis not present

## 2017-09-12 DIAGNOSIS — E2839 Other primary ovarian failure: Secondary | ICD-10-CM

## 2017-09-12 DIAGNOSIS — Z1239 Encounter for other screening for malignant neoplasm of breast: Secondary | ICD-10-CM

## 2017-09-12 DIAGNOSIS — Z780 Asymptomatic menopausal state: Secondary | ICD-10-CM | POA: Diagnosis not present

## 2017-09-12 NOTE — Telephone Encounter (Signed)
LVM for patient to return call to give BD results/recommendations per NP.

## 2017-09-12 NOTE — Telephone Encounter (Signed)
-----   Message from Gardenia Phlegm, NP sent at 09/12/2017  1:32 PM EDT ----- Bone density consistent with only slight osteopenia.  Recommend calcium, vitamin d and weight bearing exercises.   ----- Message ----- From: Interface, Rad Results In Sent: 09/12/2017  12:07 PM To: Gardenia Phlegm, NP

## 2017-09-13 NOTE — Telephone Encounter (Signed)
Spoke with patient and gave BD results.  Patient voiced understanding.  Patient knows to call with any concerns.

## 2017-09-15 DIAGNOSIS — E119 Type 2 diabetes mellitus without complications: Secondary | ICD-10-CM | POA: Diagnosis not present

## 2017-09-15 DIAGNOSIS — H35033 Hypertensive retinopathy, bilateral: Secondary | ICD-10-CM | POA: Diagnosis not present

## 2017-09-15 DIAGNOSIS — H40023 Open angle with borderline findings, high risk, bilateral: Secondary | ICD-10-CM | POA: Diagnosis not present

## 2017-09-15 DIAGNOSIS — H25013 Cortical age-related cataract, bilateral: Secondary | ICD-10-CM | POA: Diagnosis not present

## 2017-09-15 DIAGNOSIS — H0100A Unspecified blepharitis right eye, upper and lower eyelids: Secondary | ICD-10-CM | POA: Diagnosis not present

## 2017-09-15 LAB — HM DIABETES EYE EXAM

## 2017-09-21 ENCOUNTER — Encounter: Payer: Self-pay | Admitting: Internal Medicine

## 2017-11-30 ENCOUNTER — Other Ambulatory Visit: Payer: Self-pay | Admitting: Internal Medicine

## 2018-02-02 ENCOUNTER — Encounter: Payer: Self-pay | Admitting: Adult Health

## 2018-02-02 ENCOUNTER — Inpatient Hospital Stay: Payer: Medicare Other | Attending: Adult Health | Admitting: Adult Health

## 2018-02-02 ENCOUNTER — Telehealth: Payer: Self-pay | Admitting: Adult Health

## 2018-02-02 VITALS — BP 149/59 | HR 78 | Temp 98.2°F | Resp 18 | Ht 63.0 in | Wt 160.3 lb

## 2018-02-02 DIAGNOSIS — Z853 Personal history of malignant neoplasm of breast: Secondary | ICD-10-CM

## 2018-02-02 DIAGNOSIS — I10 Essential (primary) hypertension: Secondary | ICD-10-CM

## 2018-02-02 DIAGNOSIS — Z7982 Long term (current) use of aspirin: Secondary | ICD-10-CM | POA: Diagnosis not present

## 2018-02-02 DIAGNOSIS — E119 Type 2 diabetes mellitus without complications: Secondary | ICD-10-CM

## 2018-02-02 DIAGNOSIS — Z923 Personal history of irradiation: Secondary | ICD-10-CM

## 2018-02-02 DIAGNOSIS — Z79899 Other long term (current) drug therapy: Secondary | ICD-10-CM | POA: Diagnosis not present

## 2018-02-02 DIAGNOSIS — C50212 Malignant neoplasm of upper-inner quadrant of left female breast: Secondary | ICD-10-CM

## 2018-02-02 DIAGNOSIS — Z170 Estrogen receptor positive status [ER+]: Secondary | ICD-10-CM

## 2018-02-02 NOTE — Progress Notes (Signed)
CLINIC:  Survivorship   REASON FOR VISIT:  Routine follow-up for history of breast cancer.   BRIEF ONCOLOGIC HISTORY:   Per Dr. Geralyn Flash note from 01/30/2015: #1 T1c N0 breast cancer status post lumpectomy 05/07/2002. She went on to complete radiation therapy adjuvantly on 08/19/2012. #2 patient took Arimidex 1 mg daily from June 2004 to June 2014.  INTERVAL HISTORY:  [name] presents to the [facility]orship [facility] today for routine follow-up for her history of breast cancer.  Overall, she reports feeling quite well. She notes no breast issues today.  She did have right leg swelling and right foot pain, and wore a boot x 3 weeks.  This improved afterward.  She notes that walking starts to exacerbate this.    Since her last visit she underwent bone density testing in 08/2017 that demonstrated stable to slightly improving osteopenia with a T score of -1.1 in the left forearm (-1.2 two years prior).  She also underwent a screening mammogram at that time that showed no mammographic evidence of malignancy and breast density category C.    She continues to see her PCP regularly, her next appointment is Thursday.  She sees dermatology annually.  She is up to date with colonoscopies.  She has completed her pap smears/gyn screening.    REVIEW OF SYSTEMS:  Review of Systems  Constitutional: Negative for appetite change, chills, fatigue, fever and unexpected weight change.  HENT:   Negative for hearing loss, lump/mass and trouble swallowing.   Eyes: Negative for eye problems and icterus.  Respiratory: Negative for chest tightness, cough and shortness of breath.   Cardiovascular: Negative for chest pain, leg swelling and palpitations.  Gastrointestinal: Negative for abdominal distention, abdominal pain, constipation and diarrhea.  Endocrine: Negative for hot flashes.  Musculoskeletal: Negative for arthralgias.  Skin: Negative for itching and rash.  Neurological: Negative for dizziness, extremity  weakness, headaches and numbness.  Hematological: Negative for adenopathy. Does not bruise/bleed easily.  Psychiatric/Behavioral: Negative for depression. The patient is not nervous/anxious.   Breast: Denies any new nodularity, masses, tenderness, nipple changes, or nipple discharge.       PAST MEDICAL/SURGICAL HISTORY:  Past Medical History:  Diagnosis Date  . Breast cancer ([place]) left  . Diabetes mellitus    TYPE 2  . Diverticulitis    09/14/12  . Diverticulosis   . Heart murmur   . History of radiation therapy   . History of urinary tract infection   . Hyperlipidemia   . Hypertension   . Osteoporosis    Fosamax Rxed 2/13  . Personal history of colonic adenomas 11/15/2012  . Personal history of radiation therapy 06/2002  . Pneumonia    2013  . Seasonal allergies   . Tingling    right leg    Past Surgical History:  Procedure Laterality Date  . ABDOMINAL HYSTERECTOMY  1965   ovaries remain; for dysfunctional menses; ? endometriosis  . ABDOMINAL SURGERY    . BREAST BIOPSY Left 04/16/2002   malignant  . BREAST LUMPECTOMY Left   . colon polyps  2014   Dr Carlean Purl  . COLONOSCOPY  2004    Tics  . DILATION AND CURETTAGE OF UTERUS    . LAPAROSCOPIC SIGMOID COLECTOMY  04/13/2013   DR TOTH  . LAPAROSCOPIC SIGMOID COLECTOMY N/A 04/13/2013   Procedure: LAPAROSCOPIC ASSISTED SIGMOID COLECTOMY;  Surgeon: Merrie Roof, MD;  Location: [facility];  Service: General;  Laterality: N/A;  . LUMBAR LAMINECTOMY/DECOMPRESSION MICRODISCECTOMY N/A 02/19/2015   Procedure:  MICRODISCECTOMY AT L4-L5, CENTRAL DECOMPRESSION L4-L5 FOR SPINAL STENOSIS, FORAMINOTOMY FOR L4 L5 ROOT ON RIGHT;  Surgeon: Latanya Maudlin, MD;  Location: [facility];  Service: Orthopedics;  Laterality: N/A;  . removal bone spur Left 2/14   2nd finger  . TONSILLECTOMY AND ADENOIDECTOMY       ALLERGIES:  Allergies  Allergen Reactions  . Celestone [Betamethasone] Nausea Only    Sweated; face and chest with redness for several  days      CURRENT MEDICATIONS:  Outpatient Encounter Medications as of 02/02/2018  Medication Sig Note  . aspirin EC 81 MG tablet Take 81 mg by mouth daily. Pt takes on MWF   . Calcium Carbonate-Vitamin D (CALTRATE 600+D PO) Take 1 capsule by mouth daily.  02/18/2015: Pt has currently stopped taking   . cycloSPORINE (RESTASIS) 0.05 % ophthalmic emulsion    . Lancets (ONETOUCH ULTRASOFT) lancets USE AS INSTRUCTED TO CHECK  BLOOD SUGARS DAILY   . losartan (COZAAR) 50 MG tablet TAKE 1 TABLET BY MOUTH  DAILY   . metroNIDAZOLE (METROGEL) 0.75 % gel    . Multiple Vitamins-Minerals (OCUVITE ADULT 50+ PO) Take by mouth daily.   . ONE TOUCH ULTRA TEST test strip USE TO CHECK BLOOD SUGAR  DAILY   . Polyethyl Glycol-Propyl Glycol (SYSTANE) 0.4-0.3 % SOLN Place 1 drop into both eyes every morning. 02/02/2018: Pt uses 4-5 times daily  . pravastatin (PRAVACHOL) 40 MG tablet TAKE 1 TABLET BY MOUTH  DAILY   . triamterene-hydrochlorothiazide (MAXZIDE) 75-50 MG tablet TAKE ONE-HALF TABLET BY  MOUTH DAILY   . [DISCONTINUED] naproxen (NAPROSYN) 500 MG tablet Take 500 mg by mouth 2 (two) times daily.    No facility-administered encounter medications on file as of 02/02/2018.      ONCOLOGIC FAMILY HISTORY:  Family History  Problem Relation Age of Onset  . Stroke Mother 45  . Hypertension Mother   . Lung cancer Father        smoker  . Hypertension Brother   . Heart disease Brother 84  . Stroke Brother 19  . Hypertension Brother   . Hypertension Sister   . Diabetes Neg Hx   . Colon cancer Neg Hx     GENETIC COUNSELING/TESTING: Not at this time  SOCIAL HISTORY:  Social History   Socioeconomic History  . Marital status: Widowed    Spouse name: Not on file  . Number of children: Not on file  . Years of education: Not on file  . Highest education level: Not on file  Occupational History  . Not on file  Social Needs  . Financial resource strain: Not on file  . Food insecurity:    Worry: Not  on file    Inability: Not on file  . Transportation needs:    Medical: Not on file    Non-medical: Not on file  Tobacco Use  . Smoking status: Never Smoker  . Smokeless tobacco: Never Used  Substance and Sexual Activity  . Alcohol use: No    Alcohol/week: 0.0 standard drinks  . Drug use: No  . Sexual activity: Not on file  Lifestyle  . Physical activity:    Days per week: Not on file    Minutes per session: Not on file  . Stress: Not on file  Relationships  . Social connections:    Talks on phone: Not on file    Gets together: Not on file    Attends religious service: Not on file    Active member of  club or organization: Not on file    Attends meetings of clubs or organizations: Not on file    Relationship status: Not on file  . Intimate partner violence:    Fear of current or ex partner: Not on file    Emotionally abused: Not on file    Physically abused: Not on file    Forced sexual activity: Not on file  Other Topics Concern  . Not on file  Social History Narrative   Exercise: no      PHYSICAL EXAMINATION:  Vital Signs: Vitals:   02/02/18 1007  BP: (!) 149/59  Pulse: 78  Resp: 18  Temp: 98.2 F (36.8 C)  SpO2: 98%   Filed Weights   02/02/18 1007  Weight: 160 lb 4.8 oz (72.7 kg)   General: Well-nourished, well-appearing female in no acute distress.  Unaccompanied today.   HEENT: Head is normocephalic.  Pupils equal and reactive to light. Conjunctivae clear without exudate.  Sclerae anicteric. Oral mucosa is pink, moist.  Oropharynx is pink without lesions or erythema.  Lymph: No cervical, supraclavicular, or infraclavicular lymphadenopathy noted on palpation.  Cardiovascular: Regular rate and rhythm.Marland Kitchen Respiratory: Clear to auscultation bilaterally. Chest expansion symmetric; breathing non-labored.  Breast Exam:  -Left breast: No appreciable masses on palpation. No skin redness, thickening, or peau d'orange appearance; no nipple retraction or nipple  discharge; mild distortion in symmetry at previous lumpectomy site well healed scar without erythema or nodularity.  -Right breast: No appreciable masses on palpation. No skin redness, thickening, or peau d'orange appearance; no nipple retraction or nipple discharge; atypical skin lesion on right inner chest wall, just above upper inner breast, dry papular, with scaling and areas of hyperpigmentation and not well circumscribed (per patient present over past 1-2 months) -Axilla: No axillary adenopathy bilaterally.  GI: Abdomen soft and round; non-tender, non-distended. Bowel sounds normoactive. No hepatosplenomegaly.   GU: Deferred.  Neuro: No focal deficits. Steady gait.  Psych: Mood and affect normal and appropriate for situation.  MSK: No focal spinal tenderness to palpation, full range of motion in bilateral upper extremities Extremities: No edema. Skin: Warm and dry.  LABORATORY DATA:  None for this visit   DIAGNOSTIC IMAGING:  Most recent mammogram:     ASSESSMENT AND PLAN:  Ms.. Barbero is a pleasant 77 y.o. 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  She presents to the Survivorship Clinic for surveillance and routine follow-up.   1. History of breast cancer:  Ms. Polyakov is currently clinically and radiographically without evidence of disease or recurrence of breast cancer. She will be due for mammogram in 08/2018; orders placed today.  She will return in one year for LTS follow up.  I encouraged her to call me with any questions or concerns before her next visit at the cancer center, and I would be happy to see her sooner, if needed.     2. Atypical skin lesion: Recommended f/u and evaluation by dermatology.  She is going to call today, and knows not to wait until her June appointment.  3.  Bone health:  Given Ms. Farina's age, history of breast cancer, and her previous anti-estrogen therapy with Anastrozole, she is at risk for bone demineralization. Her bone density in 08/2017 showed osteopenia with a t score of -1.1, and this was improved.  She was given education on specific food  and activities to promote bone health.  4. Cancer screening:  Due to Ms. Hileman's history and her age, she should receive screening for skin cancers, colon cancer. She was encouraged to follow-up with her PCP for appropriate cancer screenings.   5. Health maintenance and wellness promotion: Ms. Busser was encouraged to consume 5-7 servings of fruits and vegetables per day. She was also encouraged to engage in moderate to vigorous exercise for 30 minutes per day most days of the week. She was instructed to limit her alcohol consumption and continue to abstain from tobacco use.     Dispo:  -Return to cancer center in one year -Bone density due in 08/2019 -Mammogram 08/2018   A total of (30) minutes of face-to-face time was spent with this patient with greater than 50% of that time in counseling and care-coordination.   Gardenia Phlegm, NP [facility]orship [facility] [phone]   Note: PRIMARY CARE PROVIDER Binnie Rail, [address] [phone]

## 2018-02-02 NOTE — Telephone Encounter (Signed)
Patient declined calendar. Printed avs.

## 2018-02-08 NOTE — Progress Notes (Signed)
Subjective:    Patient ID: Tiffany Hood, female    DOB: 09-02-1940, 77 y.o.   MRN: [id number]  HPI The patient is here for follow up.  Diabetes: She is controlling her sugars with diet. She is compliant with a diabetic diet. She is not exercising regularly due to knee pain. She checks her feet daily and denies foot lesions. She is up-to-date with an ophthalmology examination.   Hypertension: She is taking her medication daily. She is compliant with a low sodium diet.  She denies chest pain, palpitations, edema, shortness of breath and regular headaches. She is not exercising regularly.  She does not monitor her blood pressure at home.    Hyperlipidemia: She is taking her medication daily. She is compliant with a low fat/cholesterol diet. She is not exercising regularly. She denies myalgias.   Right knee pain: She has been experiencing right knee pain and is unsure why.  She has not been walking because of it.  She plans on seeing orthopedics for further evaluation if it continues.  Medications and allergies reviewed with patient and updated if appropriate.  Patient Active Problem List   Diagnosis Date Noted  . Blister of toe of left foot without infection 12/13/2016  . Multiple thyroid nodules 08/12/2016  . Thyromegaly 08/10/2016  . Diabetes ([place]) 08/08/2015  . Spinal stenosis, lumbar region, with neurogenic claudication 02/19/2015  . Osteopenia 03/30/2013  . History of colonic polyps 11/15/2012  . Diverticulitis of sigmoid colon s/p sigmoidectomy 10/05/2012  . Diverticulosis 10/19/2011  . Osteoarthritis 01/28/2010  . Hyperlipidemia 12/25/2007  . Essential hypertension 02/10/2007  . Breast cancer of upper-inner quadrant of left female breast ([place]) 05/02/2002    Current Outpatient Medications on File Prior to Visit  Medication Sig Dispense Refill  . aspirin EC 81 MG tablet Take 81 mg by mouth daily. Pt takes on MWF    . Calcium Carbonate-Vitamin D (CALTRATE 600+D PO) Take  1 capsule by mouth daily.     . cycloSPORINE (RESTASIS) 0.05 % ophthalmic emulsion     . Lancets (ONETOUCH ULTRASOFT) lancets USE AS INSTRUCTED TO CHECK  BLOOD SUGARS DAILY 100 each 3  . losartan (COZAAR) 50 MG tablet TAKE 1 TABLET BY MOUTH  DAILY 90 tablet 3  . metroNIDAZOLE (METROGEL) 0.75 % gel     . Multiple Vitamins-Minerals (OCUVITE ADULT 50+ PO) Take by mouth daily.    . ONE TOUCH ULTRA TEST test strip USE TO CHECK BLOOD SUGAR  DAILY 100 each 3  . Polyethyl Glycol-Propyl Glycol (SYSTANE) 0.4-0.3 % SOLN Place 1 drop into both eyes every morning.    . pravastatin (PRAVACHOL) 40 MG tablet TAKE 1 TABLET BY MOUTH  DAILY 90 tablet 3  . triamterene-hydrochlorothiazide (MAXZIDE) 75-50 MG tablet TAKE ONE-HALF TABLET BY  MOUTH DAILY 45 tablet 3   No current facility-administered medications on file prior to visit.     Past Medical History:  Diagnosis Date  . Breast cancer ([place]) left  . Diabetes mellitus    TYPE 2  . Diverticulitis    09/14/12  . Diverticulosis   . Heart murmur   . History of radiation therapy   . History of urinary tract infection   . Hyperlipidemia   . Hypertension   . Osteoporosis    Fosamax Rxed 2/13  . Personal history of colonic adenomas 11/15/2012  . Personal history of radiation therapy 06/2002  . Pneumonia    2013  . Seasonal allergies   . Tingling  right leg     Past Surgical History:  Procedure Laterality Date  . ABDOMINAL HYSTERECTOMY  1965   ovaries remain; for dysfunctional menses; ? endometriosis  . ABDOMINAL SURGERY    . BREAST BIOPSY Left 04/16/2002   malignant  . BREAST LUMPECTOMY Left   . colon polyps  2014   Dr Carlean Purl  . COLONOSCOPY  2004    Tics  . DILATION AND CURETTAGE OF UTERUS    . LAPAROSCOPIC SIGMOID COLECTOMY  04/13/2013   DR TOTH  . LAPAROSCOPIC SIGMOID COLECTOMY N/A 04/13/2013   Procedure: LAPAROSCOPIC ASSISTED SIGMOID COLECTOMY;  Surgeon: Merrie Roof, MD;  Location: [facility];  Service: General;  Laterality: N/A;  .  LUMBAR LAMINECTOMY/DECOMPRESSION MICRODISCECTOMY N/A 02/19/2015   Procedure: MICRODISCECTOMY AT L4-L5, CENTRAL DECOMPRESSION L4-L5 FOR SPINAL STENOSIS, FORAMINOTOMY FOR L4 L5 ROOT ON RIGHT;  Surgeon: Latanya Maudlin, MD;  Location: [facility];  Service: Orthopedics;  Laterality: N/A;  . removal bone spur Left 2/14   2nd finger  . TONSILLECTOMY AND ADENOIDECTOMY      Social History   Socioeconomic History  . Marital status: Widowed    Spouse name: Not on file  . Number of children: Not on file  . Years of education: Not on file  . Highest education level: Not on file  Occupational History  . Not on file  Social Needs  . Financial resource strain: Not on file  . Food insecurity:    Worry: Not on file    Inability: Not on file  . Transportation needs:    Medical: Not on file    Non-medical: Not on file  Tobacco Use  . Smoking status: Never Smoker  . Smokeless tobacco: Never Used  Substance and Sexual Activity  . Alcohol use: No    Alcohol/week: 0.0 standard drinks  . Drug use: No  . Sexual activity: Not on file  Lifestyle  . Physical activity:    Days per week: Not on file    Minutes per session: Not on file  . Stress: Not on file  Relationships  . Social connections:    Talks on phone: Not on file    Gets together: Not on file    Attends religious service: Not on file    Active member of club or organization: Not on file    Attends meetings of clubs or organizations: Not on file    Relationship status: Not on file  Other Topics Concern  . Not on file  Social History Narrative   Exercise: no    Family History  Problem Relation Age of Onset  . Stroke Mother 109  . Hypertension Mother   . Lung cancer Father        smoker  . Hypertension Brother   . Heart disease Brother 37  . Stroke Brother 52  . Hypertension Brother   . Hypertension Sister   . Diabetes Neg Hx   . Colon cancer Neg Hx     Review of Systems  Constitutional: Negative for chills and fever.    Respiratory: Negative for cough (with allergies), shortness of breath and wheezing.   Cardiovascular: Negative for chest pain, palpitations and leg swelling.  Gastrointestinal: Negative for abdominal pain, blood in stool and diarrhea.       Frequent BM since partial colectomy  Neurological: Positive for headaches (rare). Negative for light-headedness.       Objective:   Vitals:   02/09/18 1039  BP: 138/78  Pulse: 72  Resp: 16  Temp: 98.2 F (36.8 C)  SpO2: 95%   BP Readings from Last 3 Encounters:  02/09/18 138/78  02/02/18 (!) 149/59  08/11/17 (!) 144/76   Wt Readings from Last 3 Encounters:  02/09/18 160 lb 12.8 oz (72.9 kg)  02/02/18 160 lb 4.8 oz (72.7 kg)  08/11/17 162 lb (73.5 kg)   Body mass index is 28.48 kg/m.   Physical Exam    Constitutional: Appears well-developed and well-nourished. No distress.  HENT:  Head: Normocephalic and atraumatic.  Neck: Neck supple. No tracheal deviation present. No thyromegaly present.  No cervical lymphadenopathy Cardiovascular: Normal rate, regular rhythm and normal heart sounds.   No murmur heard. No carotid bruit .  No edema Pulmonary/Chest: Effort normal and breath sounds normal. No respiratory distress. No has no wheezes. No rales. Abdomen: Soft, nontender, nondistended Skin: Skin is warm and dry. Not diaphoretic.  Psychiatric: Normal mood and affect. Behavior is normal.      Assessment & Plan:    See Problem List for Assessment and Plan of chronic medical problems.

## 2018-02-08 NOTE — Patient Instructions (Addendum)
  Tests ordered today. Your results will be released to MyChart (or called to you) after review, usually within 72hours after test completion. If any changes need to be made, you will be notified at that same time.   Medications reviewed and updated.  Changes include :   none      Please followup in 6 months for a physical    

## 2018-02-09 ENCOUNTER — Encounter: Payer: Self-pay | Admitting: Internal Medicine

## 2018-02-09 ENCOUNTER — Other Ambulatory Visit (INDEPENDENT_AMBULATORY_CARE_PROVIDER_SITE_OTHER): Payer: Medicare Other

## 2018-02-09 ENCOUNTER — Ambulatory Visit (INDEPENDENT_AMBULATORY_CARE_PROVIDER_SITE_OTHER): Payer: Medicare Other | Admitting: Internal Medicine

## 2018-02-09 VITALS — BP 138/78 | HR 72 | Temp 98.2°F | Resp 16 | Ht 63.0 in | Wt 160.8 lb

## 2018-02-09 DIAGNOSIS — E7849 Other hyperlipidemia: Secondary | ICD-10-CM

## 2018-02-09 DIAGNOSIS — I10 Essential (primary) hypertension: Secondary | ICD-10-CM

## 2018-02-09 DIAGNOSIS — E119 Type 2 diabetes mellitus without complications: Secondary | ICD-10-CM | POA: Diagnosis not present

## 2018-02-09 DIAGNOSIS — Z1211 Encounter for screening for malignant neoplasm of colon: Secondary | ICD-10-CM

## 2018-02-09 LAB — COMPREHENSIVE METABOLIC PANEL
ALK PHOS: 65 U/L (ref 39–117)
ALT: 24 U/L (ref 0–35)
AST: 24 U/L (ref 0–37)
Albumin: 4.5 g/dL (ref 3.5–5.2)
BILIRUBIN TOTAL: 0.3 mg/dL (ref 0.2–1.2)
BUN: 18 mg/dL (ref 6–23)
CO2: 27 mEq/L (ref 19–32)
CREATININE: 0.67 mg/dL (ref 0.40–1.20)
Calcium: 9.9 mg/dL (ref 8.4–10.5)
Chloride: 100 mEq/L (ref 96–112)
GFR: 90.97 mL/min (ref >60.00)
GLUCOSE: 94 mg/dL (ref 70–99)
Potassium: 4.5 mEq/L (ref 3.5–5.1)
Sodium: 137 mEq/L (ref 135–145)
TOTAL PROTEIN: 7.9 g/dL (ref 6.0–8.3)

## 2018-02-09 LAB — LIPID PANEL
CHOLESTEROL: 199 mg/dL (ref 0–200)
HDL: 62.1 mg/dL (ref >39.00)
Total CHOL/HDL Ratio: 3
Triglycerides: 426 mg/dL — ABNORMAL HIGH (ref 0.0–149.0)

## 2018-02-09 LAB — LDL CHOLESTEROL, DIRECT: Direct LDL: 95 mg/dL

## 2018-02-09 LAB — HEMOGLOBIN A1C: Hgb A1c MFr Bld: 6.6 % — ABNORMAL HIGH (ref 4.6–6.5)

## 2018-02-09 NOTE — Assessment & Plan Note (Signed)
BP well controlled Current regimen effective and well tolerated Continue current medications at current doses cmp  

## 2018-02-09 NOTE — Assessment & Plan Note (Signed)
Diet controlled a1c Will restart exercise when able

## 2018-02-09 NOTE — Assessment & Plan Note (Signed)
Check lipid panel  Continue daily statin Regular exercise and healthy diet encouraged  

## 2018-02-10 DIAGNOSIS — M25561 Pain in right knee: Secondary | ICD-10-CM | POA: Insufficient documentation

## 2018-02-10 DIAGNOSIS — M1711 Unilateral primary osteoarthritis, right knee: Secondary | ICD-10-CM | POA: Diagnosis not present

## 2018-03-02 ENCOUNTER — Encounter: Payer: Self-pay | Admitting: Internal Medicine

## 2018-03-27 DIAGNOSIS — L820 Inflamed seborrheic keratosis: Secondary | ICD-10-CM | POA: Diagnosis not present

## 2018-04-04 ENCOUNTER — Encounter: Payer: Self-pay | Admitting: Internal Medicine

## 2018-04-06 ENCOUNTER — Other Ambulatory Visit: Payer: Self-pay | Admitting: Internal Medicine

## 2018-04-12 DIAGNOSIS — H40023 Open angle with borderline findings, high risk, bilateral: Secondary | ICD-10-CM | POA: Diagnosis not present

## 2018-05-05 ENCOUNTER — Ambulatory Visit (AMBULATORY_SURGERY_CENTER): Payer: Self-pay | Admitting: *Deleted

## 2018-05-05 ENCOUNTER — Encounter: Payer: Self-pay | Admitting: Internal Medicine

## 2018-05-05 VITALS — Ht 62.5 in | Wt 162.0 lb

## 2018-05-05 DIAGNOSIS — Z86010 Personal history of colonic polyps: Secondary | ICD-10-CM

## 2018-05-05 NOTE — Progress Notes (Signed)
Patient denies any allergies to eggs or soy. Patient denies any problems with anesthesia/sedation. Patient denies any oxygen use at home. Patient denies taking any diet/weight loss medications or blood thinners. EMMI education offered, pt declined.  

## 2018-05-19 ENCOUNTER — Ambulatory Visit (AMBULATORY_SURGERY_CENTER): Payer: Medicare Other | Admitting: Internal Medicine

## 2018-05-19 ENCOUNTER — Encounter: Payer: Self-pay | Admitting: Internal Medicine

## 2018-05-19 VITALS — BP 171/86 | HR 72 | Temp 98.0°F | Resp 12 | Ht 62.5 in | Wt 162.0 lb

## 2018-05-19 DIAGNOSIS — Z86010 Personal history of colonic polyps: Secondary | ICD-10-CM | POA: Diagnosis not present

## 2018-05-19 DIAGNOSIS — K635 Polyp of colon: Secondary | ICD-10-CM | POA: Diagnosis not present

## 2018-05-19 DIAGNOSIS — D124 Benign neoplasm of descending colon: Secondary | ICD-10-CM

## 2018-05-19 MED ORDER — SODIUM CHLORIDE 0.9 % IV SOLN: 500.0000 mL | Freq: Once | INTRAVENOUS | Status: DC

## 2018-05-19 NOTE — Progress Notes (Signed)
PT taken to PACU. Monitors in place. VSS. Report given to RN. 

## 2018-05-19 NOTE — Op Note (Signed)
[facility] Patient Name: Tiffany Hood Procedure Date: 05/19/2018 11:37 AM MRN: [id number] Endoscopist: Gatha Mayer , MD Age: 78 Referring MD:  Date of Birth: Nov 23, 1940 Gender: Female Account #: [id number] Procedure:                Colonoscopy Indications:              Surveillance: Personal history of adenomatous                            polyps on last colonoscopy 5 years ago Medicines:                Propofol per Anesthesia, Monitored Anesthesia Care Procedure:                Pre-Anesthesia Assessment:                           - Prior to the procedure, a History and Physical                            was performed, and patient medications and                            allergies were reviewed. The patient's tolerance of                            previous anesthesia was also reviewed. The risks                            and benefits of the procedure and the sedation                            options and risks were discussed with the patient.                            All questions were answered, and informed consent                            was obtained. Prior Anticoagulants: The patient has                            taken no previous anticoagulant or antiplatelet                            agents. ASA Grade Assessment: III - A patient with                            severe systemic disease. After reviewing the risks                            and benefits, the patient was deemed in                            satisfactory condition to undergo the procedure.  After obtaining informed consent, the colonoscope                            was passed under direct vision. Throughout the                            procedure, the patient's blood pressure, pulse, and                            oxygen saturations were monitored continuously. The                            Model PCF-[id number]) scope was introduced        through the anus and advanced to the the cecum,                            identified by appendiceal orifice and ileocecal                            valve. The colonoscopy was performed without                            difficulty. The patient tolerated the procedure                            well. The quality of the bowel preparation was                            good. The ileocecal valve, appendiceal orifice, and                            rectum were photographed. The bowel preparation                            used was Miralax. Scope In: 11:42:53 AM Scope Out: 11:53:58 AM Scope Withdrawal Time: 0 hours 8 minutes 0 seconds  Total Procedure Duration: 0 hours 11 minutes 5 seconds  Findings:                 The perianal and digital rectal examinations were                            normal.                           A diminutive polyp was found in the descending                            colon. The polyp was sessile. The polyp was removed                            with a cold snare. Resection and retrieval were                            complete.  Verification of patient identification                            for the specimen was done. Estimated blood loss was                            minimal.                           Many diverticula were found in the entire colon.                           There was evidence of a prior end-to-side                            colo-colonic anastomosis in the recto-sigmoid                            colon. This was patent and was characterized by                            healthy appearing mucosa. The anastomosis was                            traversed.                           The exam was otherwise without abnormality on                            direct and retroflexion views. Complications:            No immediate complications. Estimated Blood Loss:     Estimated blood loss was minimal. Impression:               - One diminutive  polyp in the descending colon,                            removed with a cold snare. Resected and retrieved.                           - Diverticulosis in the right colon.                           - Diverticulosis in the left colon.                           - Patent end-to-side colo-colonic anastomosis,                            characterized by healthy appearing mucosa.                           - The examination was otherwise normal on direct  and retroflexion views. Recommendation:           - Patient has a contact number available for                            emergencies. The signs and symptoms of potential                            delayed complications were discussed with the                            patient. Return to normal activities tomorrow.                            Written discharge instructions were provided to the                            patient.                           - Resume previous diet.                           - Continue present medications.                           - No repeat colonoscopy due to age. Gatha Mayer, MD 05/19/2018 12:01:09 PM This report has been signed electronically.

## 2018-05-19 NOTE — Patient Instructions (Addendum)
   Only 1 little polyp - removed. Still have diverticulosis in remaining colon.  I do not anticipate recommending a routine repeat colonoscopy.  I appreciate the opportunity to care for you. Gatha Mayer, MD, [facility]   Handouts Provided:  Polyps and Diverticulosis  YOU HAD AN ENDOSCOPIC PROCEDURE TODAY AT [place] [facility]:   Refer to the procedure report that was given to you for any specific questions about what was found during the examination.  If the procedure report does not answer your questions, please call your gastroenterologist to clarify.  If you requested that your care partner not be given the details of your procedure findings, then the procedure report has been included in a sealed envelope for you to review at your convenience later.  YOU SHOULD EXPECT: Some feelings of bloating in the abdomen. Passage of more gas than usual.  Walking can help get rid of the air that was put into your GI tract during the procedure and reduce the bloating. If you had a lower endoscopy (such as a colonoscopy or flexible sigmoidoscopy) you may notice spotting of blood in your stool or on the toilet paper. If you underwent a bowel prep for your procedure, you may not have a normal bowel movement for a few days.  Please Note:  You might notice some irritation and congestion in your nose or some drainage.  This is from the oxygen used during your procedure.  There is no need for concern and it should clear up in a day or so.  SYMPTOMS TO REPORT IMMEDIATELY:   Following lower endoscopy (colonoscopy or flexible sigmoidoscopy):  Excessive amounts of blood in the stool  Significant tenderness or worsening of abdominal pains  Swelling of the abdomen that is new, acute  Fever of 100F or higher   For urgent or emergent issues, a gastroenterologist can be reached at any hour by calling [phone].   DIET:  We do recommend a small meal at first, but then you may proceed to your  regular diet.  Drink plenty of fluids but you should avoid alcoholic beverages for 24 hours.  ACTIVITY:  You should plan to take it easy for the rest of today and you should NOT DRIVE or use heavy machinery until tomorrow (because of the sedation medicines used during the test).    FOLLOW UP: Our staff will call the number listed on your records the next business day following your procedure to check on you and address any questions or concerns that you may have regarding the information given to you following your procedure. If we do not reach you, we will leave a message.  However, if you are feeling well and you are not experiencing any problems, there is no need to return our call.  We will assume that you have returned to your regular daily activities without incident.  If any biopsies were taken you will be contacted by phone or by letter within the next 1-3 weeks.  Please call us at [phone] if you have not heard about the biopsies in 3 weeks.    SIGNATURES/CONFIDENTIALITY: You and/or your care partner have signed paperwork which will be entered into your electronic medical record.  These signatures attest to the fact that that the information above on your After Visit Summary has been reviewed and is understood.  Full responsibility of the confidentiality of this discharge information lies with you and/or your care-partner.

## 2018-05-19 NOTE — Progress Notes (Signed)
Called to room to assist during endoscopic procedure.  Patient ID and intended procedure confirmed with present staff. Received instructions for my participation in the procedure from the performing physician.  

## 2018-05-19 NOTE — Progress Notes (Signed)
Pt's states no medical or surgical changes since previsit or office visit. 

## 2018-05-22 ENCOUNTER — Telehealth: Payer: Self-pay

## 2018-05-22 NOTE — Telephone Encounter (Signed)
  Follow up Call-  Call back number 05/19/2018  Post procedure Call Back phone  # [phone]  Permission to leave phone message Yes  Some recent data might be hidden     Patient questions:  Do you have a fever, pain , or abdominal swelling? No. Pain Score  0 *  Have you tolerated food without any problems? Yes.    Have you been able to return to your normal activities? Yes.    Do you have any questions about your discharge instructions: Diet   No. Medications  No. Follow up visit  No.  Do you have questions or concerns about your Care? No.  Actions: * If pain score is 4 or above: No action needed, pain <4.

## 2018-05-23 ENCOUNTER — Encounter: Payer: Self-pay | Admitting: Internal Medicine

## 2018-05-23 NOTE — Progress Notes (Signed)
Benign mucosal polyp No recall at 77 My Chart

## 2018-08-08 ENCOUNTER — Encounter: Payer: Medicare Other | Admitting: Internal Medicine

## 2018-08-16 DIAGNOSIS — L821 Other seborrheic keratosis: Secondary | ICD-10-CM | POA: Diagnosis not present

## 2018-08-16 DIAGNOSIS — L570 Actinic keratosis: Secondary | ICD-10-CM | POA: Diagnosis not present

## 2018-08-16 DIAGNOSIS — L820 Inflamed seborrheic keratosis: Secondary | ICD-10-CM | POA: Diagnosis not present

## 2018-09-08 DIAGNOSIS — M238X1 Other internal derangements of right knee: Secondary | ICD-10-CM | POA: Diagnosis not present

## 2018-09-08 DIAGNOSIS — M25561 Pain in right knee: Secondary | ICD-10-CM | POA: Diagnosis not present

## 2018-09-14 ENCOUNTER — Other Ambulatory Visit: Payer: Self-pay

## 2018-09-14 NOTE — Patient Outreach (Signed)
[facility]) Care Management  09/14/2018  Tiffany Hood Jan 14, 1941 [id number]  Medication Adherence call to Tiffany Hood [facility] Identifiers Verify spoke with patient she is due on Losartan 50 mg and Pravastatin 40 mg patient explain she is taking 1 tablet daily and has a few patient ask if we can call [facility] and order both medications patient will received her medication [facility]. Tiffany Hood is showing past due under [place].  [facility] Direct Dial [phone]  Fax [phone] [email or web address]

## 2018-09-28 ENCOUNTER — Ambulatory Visit
Admission: RE | Admit: 2018-09-28 | Discharge: 2018-09-28 | Disposition: A | Discharge status: Home / Self Care | Payer: Medicare Other | Source: Ambulatory Visit | Attending: Adult Health | Admitting: Adult Health

## 2018-09-28 ENCOUNTER — Other Ambulatory Visit: Payer: Self-pay

## 2018-09-28 DIAGNOSIS — C50212 Malignant neoplasm of upper-inner quadrant of left female breast: Secondary | ICD-10-CM

## 2018-09-28 DIAGNOSIS — Z170 Estrogen receptor positive status [ER+]: Secondary | ICD-10-CM

## 2018-09-28 DIAGNOSIS — Z1231 Encounter for screening mammogram for malignant neoplasm of breast: Secondary | ICD-10-CM | POA: Diagnosis not present

## 2018-10-04 NOTE — Progress Notes (Signed)
Subjective:    Patient ID: Tiffany Hood, female    DOB: 1941/03/11, 78 y.o.   MRN: [id number]  HPI She is here for a physical exam.   She denies any changes in her health and overall feels well for her age.  She has no concerns.   She has been eating too much and has gained a little weight.    Medications and allergies reviewed with patient and updated if appropriate.  Patient Active Problem List   Diagnosis Date Noted  . Multiple thyroid nodules 08/12/2016  . Diabetes ([place]) 08/08/2015  . Spinal stenosis, lumbar region, with neurogenic claudication 02/19/2015  . Osteopenia 03/30/2013  . Diverticulitis of sigmoid colon s/p sigmoidectomy 10/05/2012  . Diverticulosis 10/19/2011  . Osteoarthritis 01/28/2010  . Hyperlipidemia 12/25/2007  . Essential hypertension 02/10/2007  . History of cancer of left breast 05/02/2002    Current Outpatient Medications on File Prior to Visit  Medication Sig Dispense Refill  . aspirin EC 81 MG tablet Take 81 mg by mouth daily. Pt takes on MWF    . Calcium Carbonate-Vitamin D (CALTRATE 600+D PO) Take 1 capsule by mouth daily.     . cycloSPORINE (RESTASIS) 0.05 % ophthalmic emulsion     . Lancets (ONETOUCH ULTRASOFT) lancets USE AS INSTRUCTED TO CHECK  BLOOD SUGARS DAILY 100 each 3  . losartan (COZAAR) 50 MG tablet TAKE 1 TABLET BY MOUTH  DAILY 90 tablet 1  . metroNIDAZOLE (METROGEL) 0.75 % gel     . Multiple Vitamins-Minerals (OCUVITE ADULT 50+ PO) Take by mouth daily.    . ONE TOUCH ULTRA TEST test strip USE TO CHECK BLOOD SUGAR  DAILY 100 each 3  . Polyethyl Glycol-Propyl Glycol (SYSTANE) 0.4-0.3 % SOLN Place 1 drop into both eyes every morning.    . pravastatin (PRAVACHOL) 40 MG tablet TAKE 1 TABLET BY MOUTH  DAILY 90 tablet 1  . triamterene-hydrochlorothiazide (MAXZIDE) 75-50 MG tablet TAKE ONE-HALF TABLET BY  MOUTH DAILY 45 tablet 1   No current facility-administered medications on file prior to visit.     Past Medical History:   Diagnosis Date  . Breast cancer ([place]) left-2004 sx  . Diabetes mellitus    TYPE 2  . Diverticulitis    09/14/12  . Diverticulosis   . Heart murmur   . History of radiation therapy   . History of urinary tract infection   . Hyperlipidemia   . Hypertension   . Osteoporosis    Fosamax Rxed 2/13  . Personal history of colonic adenomas 11/15/2012  . Personal history of radiation therapy 06/2002  . Pneumonia    2013  . Seasonal allergies   . Tingling    right leg     Past Surgical History:  Procedure Laterality Date  . ABDOMINAL HYSTERECTOMY  1965   ovaries remain; for dysfunctional menses; ? endometriosis  . ABDOMINAL SURGERY    . BREAST BIOPSY Left 04/16/2002   malignant  . BREAST LUMPECTOMY Left   . colon polyps  2014   Dr Carlean Purl  . COLONOSCOPY  2004    [facility]  . DILATION AND CURETTAGE OF UTERUS    . LAPAROSCOPIC SIGMOID COLECTOMY  04/13/2013   DR TOTH  . LAPAROSCOPIC SIGMOID COLECTOMY N/A 04/13/2013   Procedure: LAPAROSCOPIC ASSISTED SIGMOID COLECTOMY;  Surgeon: Merrie Roof, MD;  Location: [facility];  Service: General;  Laterality: N/A;  . LUMBAR LAMINECTOMY/DECOMPRESSION MICRODISCECTOMY N/A 02/19/2015   Procedure: MICRODISCECTOMY AT L4-L5, CENTRAL DECOMPRESSION L4-L5 FOR SPINAL  STENOSIS, FORAMINOTOMY FOR L4 L5 ROOT ON RIGHT;  Surgeon: Latanya Maudlin, MD;  Location: [facility];  Service: Orthopedics;  Laterality: N/A;  . removal bone spur Left 2/14   2nd finger  . TONSILLECTOMY AND ADENOIDECTOMY      Social History   Socioeconomic History  . Marital status: Widowed    Spouse name: Not on file  . Number of children: Not on file  . Years of education: Not on file  . Highest education level: Not on file  Occupational History  . Not on file  Social Needs  . Financial resource strain: Not on file  . Food insecurity    Worry: Not on file    Inability: Not on file  . Transportation needs    Medical: Not on file    Non-medical: Not on file  Tobacco Use  . Smoking  status: Never Smoker  . Smokeless tobacco: Never Used  Substance and Sexual Activity  . Alcohol use: No    Alcohol/week: 0.0 standard drinks  . Drug use: No  . Sexual activity: Not on file  Lifestyle  . Physical activity    Days per week: Not on file    Minutes per session: Not on file  . Stress: Not on file  Relationships  . Social Herbalist on phone: Not on file    Gets together: Not on file    Attends religious service: Not on file    Active member of club or organization: Not on file    Attends meetings of clubs or organizations: Not on file    Relationship status: Not on file  Other Topics Concern  . Not on file  Social History Narrative   Exercise: no    Family History  Problem Relation Age of Onset  . Stroke Mother 20  . Hypertension Mother   . Lung cancer Father        smoker  . Cancer - Lung Father   . Hypertension Brother   . Heart disease Brother 50  . Stroke Brother 15  . Hypertension Brother   . Hypertension Sister   . Diabetes Neg Hx   . Colon cancer Neg Hx   . Esophageal cancer Neg Hx   . Rectal cancer Neg Hx   . Stomach cancer Neg Hx   . Colon polyps Neg Hx     Review of Systems  Constitutional: Negative for chills and fever.  HENT: Positive for sneezing (allergies).   Respiratory: Negative for cough, shortness of breath and wheezing.   Cardiovascular: Negative for chest pain, palpitations and leg swelling.  Gastrointestinal: Negative for abdominal pain, blood in stool, constipation, diarrhea and nausea.       No gerd  Genitourinary: Negative for dysuria and hematuria.  Musculoskeletal: Positive for arthralgias (right knee, hands).  Skin: Negative for color change and rash.  Neurological: Positive for headaches (rare). Negative for dizziness and light-headedness.  Psychiatric/Behavioral: Negative for dysphoric mood. The patient is not nervous/anxious.        Objective:   Vitals:   10/05/18 0858  BP: 124/62  Pulse: 72  Resp:  16  Temp: 98.2 F (36.8 C)  SpO2: 96%   Filed Weights   10/05/18 0858  Weight: 164 lb (74.4 kg)   Body mass index is 29.52 kg/m.  BP Readings from Last 3 Encounters:  10/05/18 124/62  05/19/18 (!) 171/86  02/09/18 138/78    Wt Readings from Last 3 Encounters:  10/05/18 164 lb (74.4  kg)  05/19/18 162 lb (73.5 kg)  05/05/18 162 lb (73.5 kg)     Physical Exam Constitutional: She appears well-developed and well-nourished. No distress.  HENT:  Head: Normocephalic and atraumatic.  Right Ear: External ear normal. Normal ear canal and TM Left Ear: External ear normal.  Normal ear canal and TM Mouth/Throat: Oropharynx is clear and moist.  Eyes: Conjunctivae and EOM are normal.  Neck: Neck supple. No tracheal deviation present. No thyromegaly present.  No carotid bruit  Cardiovascular: Normal rate, regular rhythm and normal heart sounds.   No murmur heard.  No edema. Pulmonary/Chest: Effort normal and breath sounds normal. No respiratory distress. She has no wheezes. She has no rales.  Breast: deferred   Abdominal: Soft. She exhibits no distension. There is no tenderness.  Lymphadenopathy: She has no cervical adenopathy.  Skin: Skin is warm and dry. She is not diaphoretic.  Psychiatric: She has a normal mood and affect. Her behavior is normal.        Assessment & Plan:   Physical exam: Screening blood work ordered Immunizations  Discussed shingrix, others up to date Colonoscopy   No longer needed due to age 57   Up to date  Gyn   No longer sees Dexa   Up to date  Eye exams  Scheduled for next week Exercise   Limited due to knee pain -- gardening every day Weight  Overweight  - ok for age Skin  No concerns - sees derm annually Substance abuse  none  See Problem List for Assessment and Plan of chronic medical problems.   FU in 6 months

## 2018-10-04 NOTE — Patient Instructions (Addendum)
Tests ordered today. Your results will be released to [place] (or called to you) after review.  If any changes need to be made, you will be notified at that same time.  All other Health Maintenance issues reviewed.   All recommended immunizations and age-appropriate screenings are up-to-date or discussed.  No immunization administered today.   Medications reviewed and updated.  Changes include :  none   Your prescription(s) have been submitted to your pharmacy. Please take as directed and contact our office if you believe you are having problem(s) with the medication(s).   Please followup in 6 months    Health Maintenance, Female Adopting a healthy lifestyle and getting preventive care are important in promoting health and wellness. Ask your health care provider about:  The right schedule for you to have regular tests and exams.  Things you can do on your own to prevent diseases and keep yourself healthy. What should I know about diet, weight, and exercise? Eat a healthy diet   Eat a diet that includes plenty of vegetables, fruits, low-fat dairy products, and lean protein.  Do not eat a lot of foods that are high in solid fats, added sugars, or sodium. Maintain a healthy weight Body mass index (BMI) is used to identify weight problems. It estimates body fat based on height and weight. Your health care provider can help determine your BMI and help you achieve or maintain a healthy weight. Get regular exercise Get regular exercise. This is one of the most important things you can do for your health. Most adults should:  Exercise for at least 150 minutes each week. The exercise should increase your heart rate and make you sweat (moderate-intensity exercise).  Do strengthening exercises at least twice a week. This is in addition to the moderate-intensity exercise.  Spend less time sitting. Even light physical activity can be beneficial. Watch cholesterol and blood lipids Have  your blood tested for lipids and cholesterol at 77 years of age, then have this test every 5 years. Have your cholesterol levels checked more often if:  Your lipid or cholesterol levels are high.  You are older than 77 years of age.  You are at high risk for heart disease. What should I know about cancer screening? Depending on your health history and family history, you may need to have cancer screening at various ages. This may include screening for:  Breast cancer.  Cervical cancer.  Colorectal cancer.  Skin cancer.  Lung cancer. What should I know about heart disease, diabetes, and high blood pressure? Blood pressure and heart disease  High blood pressure causes heart disease and increases the risk of stroke. This is more likely to develop in people who have high blood pressure readings, are of African descent, or are overweight.  Have your blood pressure checked: ? Every 3-5 years if you are 77-13 years of age. ? Every year if you are 3 years old or older. Diabetes Have regular diabetes screenings. This checks your fasting blood sugar level. Have the screening done:  Once every three years after age 77 if you are at a normal weight and have a low risk for diabetes.  More often and at a younger age if you are overweight or have a high risk for diabetes. What should I know about preventing infection? Hepatitis B If you have a higher risk for hepatitis B, you should be screened for this virus. Talk with your health care provider to find out if you are at risk  for hepatitis B infection. Hepatitis C Testing is recommended for:  Everyone born from [birth year] through 1964.  Anyone with known risk factors for hepatitis C. Sexually transmitted infections (STIs)  Get screened for STIs, including gonorrhea and chlamydia, if: ? You are sexually active and are younger than 77 years of age. ? You are older than 77 years of age and your health care provider tells you that you are at  risk for this type of infection. ? Your sexual activity has changed since you were last screened, and you are at increased risk for chlamydia or gonorrhea. Ask your health care provider if you are at risk.  Ask your health care provider about whether you are at high risk for HIV. Your health care provider may recommend a prescription medicine to help prevent HIV infection. If you choose to take medicine to prevent HIV, you should first get tested for HIV. You should then be tested every 3 months for as long as you are taking the medicine. Pregnancy  If you are about to stop having your period (premenopausal) and you may become pregnant, seek counseling before you get pregnant.  Take 400 to 800 micrograms (mcg) of folic acid every day if you become pregnant.  Ask for birth control (contraception) if you want to prevent pregnancy. Osteoporosis and menopause Osteoporosis is a disease in which the bones lose minerals and strength with aging. This can result in bone fractures. If you are 105 years old or older, or if you are at risk for osteoporosis and fractures, ask your health care provider if you should:  Be screened for bone loss.  Take a calcium or vitamin D supplement to lower your risk of fractures.  Be given hormone replacement therapy (HRT) to treat symptoms of menopause. Follow these instructions at home: Lifestyle  Do not use any products that contain nicotine or tobacco, such as cigarettes, e-cigarettes, and chewing tobacco. If you need help quitting, ask your health care provider.  Do not use street drugs.  Do not share needles.  Ask your health care provider for help if you need support or information about quitting drugs. Alcohol use  Do not drink alcohol if: ? Your health care provider tells you not to drink. ? You are pregnant, may be pregnant, or are planning to become pregnant.  If you drink alcohol: ? Limit how much you use to 0-1 drink a day. ? Limit intake if you  are breastfeeding.  Be aware of how much alcohol is in your drink. In the [place] one drink equals one 12 oz bottle of beer (355 mL), one 5 oz glass of wine (148 mL), or one 1 oz glass of hard liquor (44 mL). General instructions  Schedule regular health, dental, and eye exams.  Stay current with your vaccines.  Tell your health care provider if: ? You often feel depressed. ? You have ever been abused or do not feel safe at home. Summary  Adopting a healthy lifestyle and getting preventive care are important in promoting health and wellness.  Follow your health care provider's instructions about healthy diet, exercising, and getting tested or screened for diseases.  Follow your health care provider's instructions on monitoring your cholesterol and blood pressure. This information is not intended to replace advice given to you by your health care provider. Make sure you discuss any questions you have with your health care provider. Document Released: 09/28/2010 Document Revised: 03/08/2018 Document Reviewed: 03/08/2018 Elsevier Patient Education  2020 [employer].

## 2018-10-05 ENCOUNTER — Encounter: Payer: Self-pay | Admitting: Internal Medicine

## 2018-10-05 ENCOUNTER — Other Ambulatory Visit (INDEPENDENT_AMBULATORY_CARE_PROVIDER_SITE_OTHER): Payer: Medicare Other

## 2018-10-05 ENCOUNTER — Ambulatory Visit (INDEPENDENT_AMBULATORY_CARE_PROVIDER_SITE_OTHER): Payer: Medicare Other | Admitting: Internal Medicine

## 2018-10-05 ENCOUNTER — Other Ambulatory Visit: Payer: Self-pay

## 2018-10-05 VITALS — BP 124/62 | HR 72 | Temp 98.2°F | Resp 16 | Ht 62.5 in | Wt 164.0 lb

## 2018-10-05 DIAGNOSIS — Z0000 Encounter for general adult medical examination without abnormal findings: Secondary | ICD-10-CM

## 2018-10-05 DIAGNOSIS — Z853 Personal history of malignant neoplasm of breast: Secondary | ICD-10-CM

## 2018-10-05 DIAGNOSIS — E119 Type 2 diabetes mellitus without complications: Secondary | ICD-10-CM

## 2018-10-05 DIAGNOSIS — M85839 Other specified disorders of bone density and structure, unspecified forearm: Secondary | ICD-10-CM | POA: Diagnosis not present

## 2018-10-05 DIAGNOSIS — I10 Essential (primary) hypertension: Secondary | ICD-10-CM

## 2018-10-05 DIAGNOSIS — M1711 Unilateral primary osteoarthritis, right knee: Secondary | ICD-10-CM

## 2018-10-05 DIAGNOSIS — E042 Nontoxic multinodular goiter: Secondary | ICD-10-CM

## 2018-10-05 DIAGNOSIS — E7849 Other hyperlipidemia: Secondary | ICD-10-CM

## 2018-10-05 LAB — CBC WITH DIFFERENTIAL/PLATELET
Basophils Absolute: 0 10*3/uL (ref 0.0–0.1)
Basophils Relative: 0.3 % (ref 0.0–3.0)
Eosinophils Absolute: 0.2 10*3/uL (ref 0.0–0.7)
Eosinophils Relative: 3 % (ref 0.0–5.0)
HCT: 41.1 % (ref 36.0–46.0)
Hemoglobin: 13.7 g/dL (ref 12.0–15.0)
Lymphocytes Relative: 20.8 % (ref 12.0–46.0)
Lymphs Abs: 1.6 10*3/uL (ref 0.7–4.0)
MCHC: 33.3 g/dL (ref 30.0–36.0)
MCV: 87.3 fl (ref 78.0–100.0)
Monocytes Absolute: 0.8 10*3/uL (ref 0.1–1.0)
Monocytes Relative: 9.9 % (ref 3.0–12.0)
Neutro Abs: 5.2 10*3/uL (ref 1.4–7.7)
Neutrophils Relative %: 66 % (ref 43.0–77.0)
Platelets: 223 10*3/uL (ref 150.0–400.0)
RBC: 4.71 Mil/uL (ref 3.87–5.11)
RDW: 15.2 % (ref 11.5–15.5)
WBC: 7.9 10*3/uL (ref 4.0–10.5)

## 2018-10-05 LAB — COMPREHENSIVE METABOLIC PANEL
ALT: 22 U/L (ref 0–35)
AST: 18 U/L (ref 0–37)
Albumin: 4.3 g/dL (ref 3.5–5.2)
Alkaline Phosphatase: 73 U/L (ref 39–117)
BUN: 29 mg/dL — ABNORMAL HIGH (ref 6–23)
CO2: 30 mEq/L (ref 19–32)
Calcium: 9.7 mg/dL (ref 8.4–10.5)
Chloride: 100 mEq/L (ref 96–112)
Creatinine, Ser: 0.68 mg/dL (ref 0.40–1.20)
GFR: 83.99 mL/min (ref >60.00)
Glucose, Bld: 97 mg/dL (ref 70–99)
Potassium: 3.7 mEq/L (ref 3.5–5.1)
Sodium: 139 mEq/L (ref 135–145)
Total Bilirubin: 0.4 mg/dL (ref 0.2–1.2)
Total Protein: 7.3 g/dL (ref 6.0–8.3)

## 2018-10-05 LAB — LIPID PANEL
Cholesterol: 200 mg/dL (ref 0–200)
HDL: 75.7 mg/dL (ref >39.00)
NonHDL: 124.66
Total CHOL/HDL Ratio: 3
Triglycerides: 241 mg/dL — ABNORMAL HIGH (ref 0.0–149.0)
VLDL: 48.2 mg/dL — ABNORMAL HIGH (ref 0.0–40.0)

## 2018-10-05 LAB — HEMOGLOBIN A1C: Hgb A1c MFr Bld: 6.7 % — ABNORMAL HIGH (ref 4.6–6.5)

## 2018-10-05 LAB — TSH: TSH: 1.31 u[IU]/mL (ref 0.35–4.50)

## 2018-10-05 LAB — LDL CHOLESTEROL, DIRECT: Direct LDL: 104 mg/dL

## 2018-10-05 NOTE — Assessment & Plan Note (Addendum)
dexa up to date Taking caltrate Active, but not exercising much due to knee pain

## 2018-10-05 NOTE — Assessment & Plan Note (Signed)
Right knee- going to emerg ortho -  Has had injections, which helps

## 2018-10-05 NOTE — Assessment & Plan Note (Signed)
Check lipid panel  Continue daily statin Regular exercise and healthy diet encouraged  

## 2018-10-05 NOTE — Assessment & Plan Note (Signed)
Mammogram up to date.  

## 2018-10-05 NOTE — Assessment & Plan Note (Addendum)
Diet controlled Check a1c Low sugar / carb diet Stressed continuing increased activity - limited by knee pain

## 2018-10-05 NOTE — Assessment & Plan Note (Signed)
BP well controlled Current regimen effective and well tolerated Continue current medications at current doses cmp  

## 2018-10-05 NOTE — Assessment & Plan Note (Signed)
Due for follow up US - ordered tsh

## 2018-10-16 ENCOUNTER — Ambulatory Visit
Admission: RE | Admit: 2018-10-16 | Discharge: 2018-10-16 | Disposition: A | Discharge status: Home / Self Care | Payer: Medicare Other | Source: Ambulatory Visit | Attending: Internal Medicine | Admitting: Internal Medicine

## 2018-10-16 DIAGNOSIS — E042 Nontoxic multinodular goiter: Secondary | ICD-10-CM | POA: Diagnosis not present

## 2018-10-17 ENCOUNTER — Encounter: Payer: Self-pay | Admitting: Internal Medicine

## 2018-11-02 ENCOUNTER — Other Ambulatory Visit: Payer: Self-pay | Admitting: Internal Medicine

## 2018-12-18 ENCOUNTER — Other Ambulatory Visit: Payer: Self-pay

## 2018-12-18 NOTE — Patient Outreach (Signed)
[place] [facility]) Care Management  12/18/2018  Tiffany Hood 04/16/1940 [id number]   Medication Adherence call to [name] Compliant Voice message left with a call back number. [name] is showing past due on Pravastatin 40 mg and Lovastatin 50 mg under [place].   [place] Management Direct Dial [phone]  Fax [phone] [email or web address]

## 2019-01-08 ENCOUNTER — Other Ambulatory Visit: Payer: Self-pay

## 2019-01-08 NOTE — Patient Outreach (Signed)
[place] [facility]) Care Management  01/08/2019  Tiffany Hood [birth year] [id number]   Medication Adherence call to Tiffany Hood [facility] Identifiers Verify spoke with patient she is past due on Losartan 50 mg patient explain she takes 1 tablet daily and has enough for two more month patient explain that at some point she received a duplicate prescription from [facility] patient will order when due. Tiffany Hood is showing past due under [place].   [place] Management Direct Dial [phone]  Fax [phone] [email or web address]

## 2019-01-16 ENCOUNTER — Telehealth: Payer: Self-pay | Admitting: Adult Health

## 2019-01-16 NOTE — Telephone Encounter (Signed)
[place] PAL 11/19 moved appointment to 11/13. Confirmed with patient.

## 2019-02-05 ENCOUNTER — Encounter: Payer: Medicare Other | Admitting: Adult Health

## 2019-02-08 NOTE — Progress Notes (Signed)
CLINIC:  Survivorship   REASON FOR VISIT:  Routine follow-up for history of breast cancer.   BRIEF ONCOLOGIC HISTORY:   Per Dr. Geralyn Flash note from 01/30/2015: #1 T1c N0 breast cancer status post lumpectomy 05/07/2002. She went on to complete radiation therapy adjuvantly on 08/19/2012. #2 patient took Arimidex 1 mg daily from June 2004 to June 2014.  INTERVAL HISTORY:  [name] presents to the Survivorship Clinic today for routine follow-up for her history of breast cancer.   Since her last visit she underwent mammogram on 09/28/2018 that showed no evidence of malignancy and breast density B.  Her most recent bone density in 2019 that showed -1.1 which is osteopenia.  Tiffany Hood notes a weight gain of about 8 pounds this year.  She isn't exercising due to right knee pain.  She isn't following a particular diet.    Tiffany Hood sees dermatology annually.  She also sees her PCP regularly.  She underwent her last colonoscopy in 04/2018 and graduated from f/u at that point.  She no longer needs GYN cancer screening.  REVIEW OF SYSTEMS:  Review of Systems  Constitutional: Negative for appetite change, chills, fatigue, fever and unexpected weight change.  HENT:   Negative for hearing loss, lump/mass and trouble swallowing.   Eyes: Negative for eye problems and icterus.  Respiratory: Negative for chest tightness, cough and shortness of breath.   Cardiovascular: Negative for chest pain, leg swelling and palpitations.  Gastrointestinal: Negative for abdominal distention, abdominal pain, constipation and diarrhea.  Endocrine: Negative for hot flashes.  Musculoskeletal: Negative for arthralgias.  Skin: Negative for itching and rash.  Neurological: Negative for dizziness, extremity weakness, headaches and numbness.  Hematological: Negative for adenopathy. Does not bruise/bleed easily.  Psychiatric/Behavioral: Negative for depression. The patient is not nervous/anxious.   Breast: Denies any new nodularity,  masses, tenderness, nipple changes, or nipple discharge.       PAST MEDICAL/SURGICAL HISTORY:  Past Medical History:  Diagnosis Date  . Breast cancer ([place]) left-2004 sx  . Diabetes mellitus    TYPE 2  . Diverticulitis    09/14/12  . Diverticulosis   . Heart murmur   . History of radiation therapy   . History of urinary tract infection   . Hyperlipidemia   . Hypertension   . Osteoporosis    Fosamax Rxed 2/13  . Personal history of colonic adenomas 11/15/2012  . Personal history of radiation therapy 06/2002  . Pneumonia    2013  . Seasonal allergies   . Tingling    right leg    Past Surgical History:  Procedure Laterality Date  . ABDOMINAL HYSTERECTOMY  1965   ovaries remain; for dysfunctional menses; ? endometriosis  . ABDOMINAL SURGERY    . BREAST BIOPSY Left 04/16/2002   malignant  . BREAST LUMPECTOMY Left   . colon polyps  2014   Dr Carlean Purl  . COLONOSCOPY  2004    [facility]  . DILATION AND CURETTAGE OF UTERUS    . LAPAROSCOPIC SIGMOID COLECTOMY  04/13/2013   DR TOTH  . LAPAROSCOPIC SIGMOID COLECTOMY N/A 04/13/2013   Procedure: LAPAROSCOPIC ASSISTED SIGMOID COLECTOMY;  Surgeon: Merrie Roof, MD;  Location: [facility];  Service: General;  Laterality: N/A;  . LUMBAR LAMINECTOMY/DECOMPRESSION MICRODISCECTOMY N/A 02/19/2015   Procedure: MICRODISCECTOMY AT L4-L5, CENTRAL DECOMPRESSION L4-L5 FOR SPINAL STENOSIS, FORAMINOTOMY FOR L4 L5 ROOT ON RIGHT;  Surgeon: Latanya Maudlin, MD;  Location: [facility];  Service: Orthopedics;  Laterality: N/A;  . removal bone spur Left 2/14  2nd finger  . TONSILLECTOMY AND ADENOIDECTOMY       ALLERGIES:  Allergies  Allergen Reactions  . Celestone [Betamethasone] Nausea Only    Sweated; face and chest with redness for several days      CURRENT MEDICATIONS:  Outpatient Encounter Medications as of 02/09/2019  Medication Sig Note  . aspirin EC 81 MG tablet Take 81 mg by mouth daily. Pt takes on MWF   . Calcium Carbonate-Vitamin D (CALTRATE  600+D PO) Take 1 capsule by mouth daily.  02/18/2015: Pt has currently stopped taking   . cycloSPORINE (RESTASIS) 0.05 % ophthalmic emulsion    . Lancets (ONETOUCH ULTRASOFT) lancets USE AS INSTRUCTED TO CHECK  BLOOD SUGARS DAILY   . losartan (COZAAR) 50 MG tablet TAKE 1 TABLET BY MOUTH  DAILY   . metroNIDAZOLE (METROGEL) 0.75 % gel    . Multiple Vitamins-Minerals (OCUVITE ADULT 50+ PO) Take by mouth daily.   . ONE TOUCH ULTRA TEST test strip USE TO CHECK BLOOD SUGAR  DAILY   . Polyethyl Glycol-Propyl Glycol (SYSTANE) 0.4-0.3 % SOLN Place 1 drop into both eyes every morning. 02/02/2018: Pt uses 4-5 times daily  . pravastatin (PRAVACHOL) 40 MG tablet TAKE 1 TABLET BY MOUTH  DAILY   . triamterene-hydrochlorothiazide (MAXZIDE) 75-50 MG tablet TAKE ONE-HALF TABLET BY  MOUTH DAILY    No facility-administered encounter medications on file as of 02/09/2019.      ONCOLOGIC FAMILY HISTORY:  Family History  Problem Relation Age of Onset  . Stroke Mother 2  . Hypertension Mother   . Lung cancer Father        smoker  . Cancer - Lung Father   . Hypertension Brother   . Heart disease Brother 55  . Stroke Brother 51  . Hypertension Brother   . Hypertension Sister   . Diabetes Neg Hx   . Colon cancer Neg Hx   . Esophageal cancer Neg Hx   . Rectal cancer Neg Hx   . Stomach cancer Neg Hx   . Colon polyps Neg Hx     GENETIC COUNSELING/TESTING: Not at this time  SOCIAL HISTORY:  Social History   Socioeconomic History  . Marital status: Widowed    Spouse name: Not on file  . Number of children: Not on file  . Years of education: Not on file  . Highest education level: Not on file  Occupational History  . Not on file  Social Needs  . Financial resource strain: Not on file  . Food insecurity    Worry: Not on file    Inability: Not on file  . Transportation needs    Medical: Not on file    Non-medical: Not on file  Tobacco Use  . Smoking status: Never Smoker  . Smokeless tobacco:  Never Used  Substance and Sexual Activity  . Alcohol use: No    Alcohol/week: 0.0 standard drinks  . Drug use: No  . Sexual activity: Not on file  Lifestyle  . Physical activity    Days per week: Not on file    Minutes per session: Not on file  . Stress: Not on file  Relationships  . Social Herbalist on phone: Not on file    Gets together: Not on file    Attends religious service: Not on file    Active member of club or organization: Not on file    Attends meetings of clubs or organizations: Not on file    Relationship status:  Not on file  . Intimate partner violence    Fear of current or ex partner: Not on file    Emotionally abused: Not on file    Physically abused: Not on file    Forced sexual activity: Not on file  Other Topics Concern  . Not on file  Social History Narrative   Exercise: no      PHYSICAL EXAMINATION:  Vital Signs: Vitals:   02/09/19 1115  BP: (!) 130/58  Pulse: 96  Resp: 18  Temp: 98.8 F (37.1 C)  SpO2: 96%   Filed Weights   02/09/19 1115  Weight: 168 lb 12.8 oz (76.6 kg)   General: Well-nourished, well-appearing female in no acute distress.  Unaccompanied today.   HEENT: Head is normocephalic.  Pupils equal and reactive to light. Conjunctivae clear without exudate.  Sclerae anicteric. Oral mucosa is pink, moist.  Oropharynx is pink without lesions or erythema.  Lymph: No cervical, supraclavicular, or infraclavicular lymphadenopathy noted on palpation.  Cardiovascular: Regular rate and rhythm.Marland Kitchen Respiratory: Clear to auscultation bilaterally. Chest expansion symmetric; breathing non-labored.  Breast Exam:  -Left breast: No appreciable masses on palpation. No skin redness, thickening, or peau d'orange appearance; no nipple retraction or nipple discharge; mild distortion in symmetry at previous lumpectomy site well healed scar without erythema or nodularity.  -Right breast: No appreciable masses on palpation. No skin redness,  thickening, or peau d'orange appearance; no nipple retraction or nipple discharge; -Axilla: No axillary adenopathy bilaterally.  GI: Abdomen soft and round; non-tender, non-distended. Bowel sounds normoactive. No hepatosplenomegaly.   GU: Deferred.  Neuro: No focal deficits. Steady gait.  Psych: Mood and affect normal and appropriate for situation.  MSK: No focal spinal tenderness to palpation, full range of motion in bilateral upper extremities Extremities: No edema. Skin: Warm and dry.  LABORATORY DATA:  None for this visit   DIAGNOSTIC IMAGING:  Most recent mammogram:  CLINICAL DATA:  Screening.  EXAM: DIGITAL SCREENING BILATERAL MAMMOGRAM WITH TOMO AND CAD  COMPARISON:  Previous exam(s).  ACR Breast Density Category b: There are scattered areas of fibroglandular density.  FINDINGS: There are no findings suspicious for malignancy. Images were processed with CAD.  IMPRESSION: No mammographic evidence of malignancy. A result letter of this screening mammogram will be mailed directly to the patient.  RECOMMENDATION: Screening mammogram in one year. (Code:SM-B-01Y)  BI-RADS CATEGORY  1: Negative.   Electronically Signed   By: Abelardo Diesel M.D.   On: 09/30/2018 13:46   ASSESSMENT AND PLAN:  Ms.. Hanssen is a pleasant 78 y.o. 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  She presents to the Survivorship Clinic for surveillance and routine follow-up.   1. History of breast cancer:  Ms. Dioguardi is currently clinically and radiographically without evidence of disease or recurrence of breast cancer. She will be due for mammogram in 09/2019.  She will return in one year for LTS follow up.  I encouraged her to call me with any questions or concerns before her next visit at the  cancer center, and I would be happy to see her sooner, if needed.     2. Bone health:  Given Ms. Biehl's age, history of breast cancer, and her previous anti-estrogen therapy with Anastrozole, she is at risk for bone demineralization. Her bone density in 08/2017 showed osteopenia with a t score of -1.1, and  this was improved.  She was given education on specific food and activities to promote bone health.  3. Cancer screening:  Due to Ms. Coor's history and her age, she should receive screening for skin cancers. She was encouraged to follow-up with her PCP for appropriate cancer screenings.   4. Health maintenance and wellness promotion: Ms. Adorno was encouraged to consume 5-7 servings of fruits and vegetables per day. She was also encouraged to engage in moderate to vigorous exercise for 30 minutes per day most days of the week. She was instructed to limit her alcohol consumption and continue to abstain from tobacco use.     Dispo:  -Return to cancer center in one year -Bone density due in 08/2019 -Mammogram 09/2019   A total of (20) minutes of face-to-face time was spent with this patient with greater than 50% of that time in counseling and care-coordination.   Gardenia Phlegm, NP Survivorship Program [facility] [phone]   Note: PRIMARY CARE PROVIDER [place]innie Rail, [place] [phone]

## 2019-02-09 ENCOUNTER — Inpatient Hospital Stay: Payer: Medicare Other | Attending: Adult Health | Admitting: Adult Health

## 2019-02-09 ENCOUNTER — Encounter: Payer: Self-pay | Admitting: Adult Health

## 2019-02-09 ENCOUNTER — Other Ambulatory Visit: Payer: Self-pay

## 2019-02-09 VITALS — BP 130/58 | HR 96 | Temp 98.8°F | Resp 18 | Ht 62.5 in | Wt 168.8 lb

## 2019-02-09 DIAGNOSIS — Z853 Personal history of malignant neoplasm of breast: Secondary | ICD-10-CM | POA: Diagnosis not present

## 2019-02-12 ENCOUNTER — Telehealth: Payer: Self-pay | Admitting: Adult Health

## 2019-02-12 NOTE — Telephone Encounter (Signed)
I left a message regarding schedule  

## 2019-04-09 NOTE — Patient Instructions (Addendum)
  Blood work was ordered.     Medications reviewed and updated.  Changes include :     Try Voltaren gel over the counter Take meloxicam 7.5 mg daily with food as needed Try tumeric and/or tart cherry juice supplements  Your prescription(s) have been submitted to your pharmacy. Please take as directed and contact our office if you believe you are having problem(s) with the medication(s).     Please followup in 6 months

## 2019-04-09 NOTE — Progress Notes (Signed)
Subjective:    Patient ID: Tiffany Hood, female    DOB: 02/04/1941, 79 y.o.   MRN: [id number]  HPI The patient is here for follow up of their chronic medical problems, including hypertension, diabetes, hyperlipidemia.  She is taking all of her medications as prescribed.   She is not exercising regularly due to her knee pain.  She is not eating as good as she should.  She eats a lot of veges.      Right knee pain: She has seen orthopedics in the past.  She had two injections and they did not help.  [facility] and was told she has some arthritis, but still has a lot of cartilage left.  She has taken meloxicam prescribed by ortho.  This has helped, but does not take it regularly.  She has been rationing it because there is no refill left.  She has finger joint pain intermittent.  It is worse when the weather changes.  Both her parents had OA and she figures that is what this is as well.  She has seen orthopedics in the past and has had a couple of injections.  She uses an Arboriculturist and it helps.  She wonders what else she can take.   She has a mole on her chest wall that her bra rubs on.  It is very itchy, irritating and sometimes feels inflamed.   Medications and allergies reviewed with patient and updated if appropriate.  Patient Active Problem List   Diagnosis Date Noted  . Multiple thyroid nodules 08/12/2016  . Diabetes ([place]) 08/08/2015  . Spinal stenosis, lumbar region, with neurogenic claudication 02/19/2015  . Osteopenia 03/30/2013  . Diverticulitis of sigmoid colon s/p sigmoidectomy 10/05/2012  . Diverticulosis 10/19/2011  . Osteoarthritis 01/28/2010  . Hyperlipidemia 12/25/2007  . Essential hypertension 02/10/2007  . History of cancer of left breast 05/02/2002    Current Outpatient Medications on File Prior to Visit  Medication Sig Dispense Refill  . aspirin EC 81 MG tablet Take 81 mg by mouth daily. Pt takes on MWF    . Calcium  Carbonate-Vitamin D (CALTRATE 600+D PO) Take 1 capsule by mouth daily.     . cycloSPORINE (RESTASIS) 0.05 % ophthalmic emulsion     . Lancets (ONETOUCH ULTRASOFT) lancets USE AS INSTRUCTED TO CHECK  BLOOD SUGARS DAILY 100 each 3  . losartan (COZAAR) 50 MG tablet TAKE 1 TABLET BY MOUTH  DAILY 90 tablet 1  . metroNIDAZOLE (METROGEL) 0.75 % gel     . Multiple Vitamins-Minerals (OCUVITE ADULT 50+ PO) Take by mouth daily.    . ONE TOUCH ULTRA TEST test strip USE TO CHECK BLOOD SUGAR  DAILY 100 each 3  . Polyethyl Glycol-Propyl Glycol (SYSTANE) 0.4-0.3 % SOLN Place 1 drop into both eyes every morning.    . pravastatin (PRAVACHOL) 40 MG tablet TAKE 1 TABLET BY MOUTH  DAILY 90 tablet 1  . triamterene-hydrochlorothiazide (MAXZIDE) 75-50 MG tablet TAKE ONE-HALF TABLET BY  MOUTH DAILY 45 tablet 1   No current facility-administered medications on file prior to visit.    Past Medical History:  Diagnosis Date  . Breast cancer ([place]) left-2004 sx  . Diabetes mellitus    TYPE 2  . Diverticulitis    09/14/12  . Diverticulosis   . Heart murmur   . History of radiation therapy   . History of urinary tract infection   . Hyperlipidemia   . Hypertension   . Osteoporosis  Fosamax Rxed 2/13  . Personal history of colonic adenomas 11/15/2012  . Personal history of radiation therapy 06/2002  . Pneumonia    2013  . Seasonal allergies   . Tingling    right leg     Past Surgical History:  Procedure Laterality Date  . ABDOMINAL HYSTERECTOMY  1965   ovaries remain; for dysfunctional menses; ? endometriosis  . ABDOMINAL SURGERY    . BREAST BIOPSY Left 04/16/2002   malignant  . BREAST LUMPECTOMY Left   . colon polyps  2014   Dr Carlean Purl  . COLONOSCOPY  2004    Tics  . DILATION AND CURETTAGE OF UTERUS    . LAPAROSCOPIC SIGMOID COLECTOMY  04/13/2013   DR TOTH  . LAPAROSCOPIC SIGMOID COLECTOMY N/A 04/13/2013   Procedure: LAPAROSCOPIC ASSISTED SIGMOID COLECTOMY;  Surgeon: Merrie Roof, MD;   Location: [facility];  Service: General;  Laterality: N/A;  . LUMBAR LAMINECTOMY/DECOMPRESSION MICRODISCECTOMY N/A 02/19/2015   Procedure: MICRODISCECTOMY AT L4-L5, CENTRAL DECOMPRESSION L4-L5 FOR SPINAL STENOSIS, FORAMINOTOMY FOR L4 L5 ROOT ON RIGHT;  Surgeon: Latanya Maudlin, MD;  Location: [facility];  Service: Orthopedics;  Laterality: N/A;  . removal bone spur Left 2/14   2nd finger  . TONSILLECTOMY AND ADENOIDECTOMY      Social History   Socioeconomic History  . Marital status: Widowed    Spouse name: Not on file  . Number of children: Not on file  . Years of education: Not on file  . Highest education level: Not on file  Occupational History  . Not on file  Tobacco Use  . Smoking status: Never Smoker  . Smokeless tobacco: Never Used  Substance and Sexual Activity  . Alcohol use: No    Alcohol/week: 0.0 standard drinks  . Drug use: No  . Sexual activity: Not on file  Other Topics Concern  . Not on file  Social History Narrative   Exercise: no   Social Determinants of Health   Financial Resource Strain:   . Difficulty of Paying Living Expenses: Not on file  Food Insecurity:   . Worried About Charity fundraiser in the Last Year: Not on file  . Ran Out of Food in the Last Year: Not on file  Transportation Needs:   . Lack of Transportation (Medical): Not on file  . Lack of Transportation (Non-Medical): Not on file  Physical Activity:   . Days of Exercise per Week: Not on file  . Minutes of Exercise per Session: Not on file  Stress:   . Feeling of Stress : Not on file  Social Connections:   . Frequency of Communication with Friends and Family: Not on file  . Frequency of Social Gatherings with Friends and Family: Not on file  . Attends Religious Services: Not on file  . Active Member of Clubs or Organizations: Not on file  . Attends Archivist Meetings: Not on file  . Marital Status: Not on file    Family History  Problem Relation Age of Onset  . Stroke  Mother 33  . Hypertension Mother   . Lung cancer Father        smoker  . Cancer - Lung Father   . Hypertension Brother   . Heart disease Brother 14  . Stroke Brother 37  . Hypertension Brother   . Hypertension Sister   . Diabetes Neg Hx   . Colon cancer Neg Hx   . Esophageal cancer Neg Hx   . Rectal cancer  Neg Hx   . Stomach cancer Neg Hx   . Colon polyps Neg Hx     Review of Systems  Constitutional: Negative for chills and fever.  Respiratory: Negative for cough, shortness of breath and wheezing.   Cardiovascular: Negative for chest pain, palpitations and leg swelling.  Musculoskeletal: Positive for arthralgias.  Neurological: Positive for headaches (rare). Negative for light-headedness.       Objective:   Vitals:   04/10/19 0933  BP: 134/72  Pulse: 88  Resp: 16  Temp: 98.9 F (37.2 C)  SpO2: 96%   BP Readings from Last 3 Encounters:  04/10/19 134/72  02/09/19 (!) 130/58  10/05/18 124/62   Wt Readings from Last 3 Encounters:  04/10/19 163 lb (73.9 kg)  02/09/19 168 lb 12.8 oz (76.6 kg)  10/05/18 164 lb (74.4 kg)   Body mass index is 29.34 kg/m.   Physical Exam    Constitutional: Appears well-developed and well-nourished. No distress.  HENT:  Head: Normocephalic and atraumatic.  Neck: Neck supple. No tracheal deviation present. No thyromegaly present.  No cervical lymphadenopathy Cardiovascular: Normal rate, regular rhythm and normal heart sounds.   No murmur heard. No carotid bruit .  No edema Pulmonary/Chest: Effort normal and breath sounds normal. No respiratory distress. No has no wheezes. No rales.  Skin: Skin is warm and dry. Not diaphoretic.  Psychiatric: Normal mood and affect. Behavior is normal.   Diabetic Foot Exam - Simple   Simple Foot Form Diabetic Foot exam was performed with the following findings: Yes 04/10/2019 10:28 AM  Visual Inspection No deformities, no ulcerations, no other skin breakdown bilaterally: Yes Sensation Testing  Intact to touch and monofilament testing bilaterally: Yes Pulse Check Posterior Tibialis and Dorsalis pulse intact bilaterally: Yes Comments       Assessment & Plan:    See Problem List for Assessment and Plan of chronic medical problems.    This visit occurred during the SARS-CoV-2 public health emergency.  Safety protocols were in place, including screening questions prior to the visit, additional usage of staff PPE, and extensive cleaning of exam room while observing appropriate contact time as indicated for disinfecting solutions.

## 2019-04-10 ENCOUNTER — Ambulatory Visit (INDEPENDENT_AMBULATORY_CARE_PROVIDER_SITE_OTHER): Payer: Medicare Other | Admitting: Internal Medicine

## 2019-04-10 ENCOUNTER — Encounter: Payer: Self-pay | Admitting: Internal Medicine

## 2019-04-10 ENCOUNTER — Other Ambulatory Visit: Payer: Self-pay

## 2019-04-10 VITALS — BP 134/72 | HR 88 | Temp 98.9°F | Resp 16 | Ht 62.5 in | Wt 163.0 lb

## 2019-04-10 DIAGNOSIS — E7849 Other hyperlipidemia: Secondary | ICD-10-CM

## 2019-04-10 DIAGNOSIS — I10 Essential (primary) hypertension: Secondary | ICD-10-CM | POA: Diagnosis not present

## 2019-04-10 DIAGNOSIS — M1711 Unilateral primary osteoarthritis, right knee: Secondary | ICD-10-CM

## 2019-04-10 DIAGNOSIS — E119 Type 2 diabetes mellitus without complications: Secondary | ICD-10-CM

## 2019-04-10 LAB — COMPREHENSIVE METABOLIC PANEL
ALT: 18 U/L (ref 0–35)
AST: 18 U/L (ref 0–37)
Albumin: 4.3 g/dL (ref 3.5–5.2)
Alkaline Phosphatase: 73 U/L (ref 39–117)
BUN: 22 mg/dL (ref 6–23)
CO2: 28 mEq/L (ref 19–32)
Calcium: 10.3 mg/dL (ref 8.4–10.5)
Chloride: 99 mEq/L (ref 96–112)
Creatinine, Ser: 0.63 mg/dL (ref 0.40–1.20)
GFR: 91.61 mL/min (ref >60.00)
Glucose, Bld: 94 mg/dL (ref 70–99)
Potassium: 3.9 mEq/L (ref 3.5–5.1)
Sodium: 136 mEq/L (ref 135–145)
Total Bilirubin: 0.3 mg/dL (ref 0.2–1.2)
Total Protein: 7.6 g/dL (ref 6.0–8.3)

## 2019-04-10 LAB — HEMOGLOBIN A1C: Hgb A1c MFr Bld: 6.6 % — ABNORMAL HIGH (ref 4.6–6.5)

## 2019-04-10 LAB — LIPID PANEL
Cholesterol: 207 mg/dL — ABNORMAL HIGH (ref 0–200)
HDL: 60.6 mg/dL (ref >39.00)
NonHDL: 146.76
Total CHOL/HDL Ratio: 3
Triglycerides: 309 mg/dL — ABNORMAL HIGH (ref 0.0–149.0)
VLDL: 61.8 mg/dL — ABNORMAL HIGH (ref 0.0–40.0)

## 2019-04-10 LAB — LDL CHOLESTEROL, DIRECT: Direct LDL: 103 mg/dL

## 2019-04-10 MED ORDER — MELOXICAM 7.5 MG PO TABS: 7.5000 mg | ORAL_TABLET | Freq: Every day | ORAL | 1 refills | Status: DC

## 2019-04-10 NOTE — Assessment & Plan Note (Signed)
Chronic BP well controlled Current regimen effective and well tolerated Continue current medications at current doses CMP 

## 2019-04-10 NOTE — Assessment & Plan Note (Addendum)
Chronic She has seen orthopedics for both-injections have not been helpful Discussed different treatments including topical medications such as Voltaren and over-the-counter arthritis medications, turmeric or tart cherry supplements, NSAIDs, Tylenol.  Discussed the importance of regular exercise Discussed side effects of taking meloxicam-she would like to try this at a lower dose either daily or as needed We will try Voltaren gel Can try other supplements as well Stressed regular exercise

## 2019-04-10 NOTE — Assessment & Plan Note (Signed)
Chronic Check lipid panel  Continue daily statin Regular exercise and healthy diet encouraged  

## 2019-04-10 NOTE — Assessment & Plan Note (Signed)
Chronic Diet controlled Check a1c Low sugar / carb diet Stressed regular exercise

## 2019-04-11 ENCOUNTER — Encounter: Payer: Self-pay | Admitting: Internal Medicine

## 2019-05-07 ENCOUNTER — Ambulatory Visit: Payer: Medicare Other

## 2019-05-28 DIAGNOSIS — H2513 Age-related nuclear cataract, bilateral: Secondary | ICD-10-CM | POA: Diagnosis not present

## 2019-05-28 DIAGNOSIS — H25013 Cortical age-related cataract, bilateral: Secondary | ICD-10-CM | POA: Diagnosis not present

## 2019-05-28 DIAGNOSIS — H0102A Squamous blepharitis right eye, upper and lower eyelids: Secondary | ICD-10-CM | POA: Diagnosis not present

## 2019-05-28 DIAGNOSIS — H40023 Open angle with borderline findings, high risk, bilateral: Secondary | ICD-10-CM | POA: Diagnosis not present

## 2019-05-28 DIAGNOSIS — E119 Type 2 diabetes mellitus without complications: Secondary | ICD-10-CM | POA: Diagnosis not present

## 2019-06-01 ENCOUNTER — Telehealth: Payer: Self-pay | Admitting: Internal Medicine

## 2019-06-01 NOTE — Chronic Care Management (AMB) (Signed)
  Chronic Care Management   Note  06/01/2019 Name: [name] MRN: [id number] DOB: Aug 14, 1940  Tiffany Hood is a 79 y.o. year old female who is a primary care patient of Burns, Claudina Lick, MD. I reached out to [name] by phone today in response to a referral sent by [name] PCP, Binnie Rail, MD.   [name] was given information about Chronic Care Management services today including:  1. CCM service includes personalized support from designated clinical staff supervised by her physician, including individualized plan of care and coordination with other care providers 2. 24/7 contact phone numbers for assistance for urgent and routine care needs. 3. Service will only be billed when office clinical staff spend 20 minutes or more in a month to coordinate care. 4. Only one practitioner may furnish and bill the service in a calendar month. 5. The patient may stop CCM services at any time (effective at the end of the month) by phone call to the office staff.   Patient agreed to services and verbal consent obtained.   Follow up plan:   [name] [employer] Scheduler

## 2019-06-12 DIAGNOSIS — H401121 Primary open-angle glaucoma, left eye, mild stage: Secondary | ICD-10-CM | POA: Diagnosis not present

## 2019-06-12 DIAGNOSIS — H401112 Primary open-angle glaucoma, right eye, moderate stage: Secondary | ICD-10-CM | POA: Diagnosis not present

## 2019-06-15 ENCOUNTER — Other Ambulatory Visit: Payer: Self-pay | Admitting: Internal Medicine

## 2019-06-18 ENCOUNTER — Other Ambulatory Visit: Payer: Self-pay

## 2019-06-18 ENCOUNTER — Ambulatory Visit: Payer: Medicare Other | Admitting: Pharmacist

## 2019-06-18 DIAGNOSIS — M1711 Unilateral primary osteoarthritis, right knee: Secondary | ICD-10-CM

## 2019-06-18 DIAGNOSIS — E7849 Other hyperlipidemia: Secondary | ICD-10-CM

## 2019-06-18 DIAGNOSIS — I10 Essential (primary) hypertension: Secondary | ICD-10-CM

## 2019-06-18 DIAGNOSIS — E119 Type 2 diabetes mellitus without complications: Secondary | ICD-10-CM

## 2019-06-18 NOTE — Patient Instructions (Addendum)
Visit Information  Thank you for meeting with me to discuss your medications! I look forward to working with you to achieve your health care goals. Below is a summary of what we talked about during the visit:  Goals Addressed            This Visit's Progress   . Pharmacy Care Plan       CARE PLAN ENTRY  Current Barriers:  . Chronic Disease Management support, education, and care coordination needs related to HTN, HLD, and DMII  Pharmacist Clinical Goal(s):  Marland Kitchen Maintain BP < 130/80 . Maintain LDL < 100 and triglycerides < 150 . Maintain A1c < 7.0% . Ensure safety, efficacy, and affordability of medications  Interventions: . Comprehensive medication review performed. . May discontinue aspirin 81 mg due to lack of benefits in healthy people over 70 and increased risk for bleeding . Recommend over-the-counter fish oil 1000 mg daily for triglycerides. Store in the freezer to prevent fishy taste. . Recommend utilizing insurance benefit for OTC items . Recommend checking blood pressure and blood sugar a few times per week.  . Monitor for dizziness, check BP if feeling dizzy because BP may be too low.   Patient Self Care Activities:  . Self administers medications as prescribed, Calls pharmacy for medication refills, and Calls provider office for new concerns or questions  Initial goal documentation       Tiffany Hood was given information about Chronic Care Management services today including:  1. CCM service includes personalized support from designated clinical staff supervised by her physician, including individualized plan of care and coordination with other care providers 2. 24/7 contact phone numbers for assistance for urgent and routine care needs. 3. Standard insurance, coinsurance, copays and deductibles apply for chronic care management only during months in which we provide at least 20 minutes of these services. Most insurances cover these services at 100%, however patients  may be responsible for any copay, coinsurance and/or deductible if applicable. This service may help you avoid the need for more expensive face-to-face services. 4. Only one practitioner may furnish and bill the service in a calendar month. 5. The patient may stop CCM services at any time (effective at the end of the month) by phone call to the office staff.  Patient agreed to services and verbal consent obtained.   The patient verbalized understanding of instructions provided today and agreed to receive a mailed copy of patient instruction and/or educational materials. Telephone follow up appointment with pharmacy team member scheduled for: 10/17/19 @ 1:00 pm   Charlene Brooke, PharmD Clinical Pharmacist [facility] [phone]  High Triglycerides Eating Plan Triglycerides are a type of fat in the blood. High levels of triglycerides can increase your risk of heart disease and stroke. If your triglyceride levels are high, choosing the right foods can help lower your triglycerides and keep your heart healthy. Work with your health care provider or a diet and nutrition specialist (dietitian) to develop an eating plan that is right for you. What are tips for following this plan? General guidelines   Lose weight, if you are overweight. For most people, losing 5-10 lbs (2-5 kg) helps lower triglyceride levels. A weight-loss plan may include. ? 30 minutes of exercise at least 5 days a week. ? Reducing the amount of calories, sugar, and fat you eat.  Eat a wide variety of fresh fruits, vegetables, and whole grains. These foods are high in fiber.  Eat foods that contain  healthy fats, such as fatty fish, nuts, seeds, and olive oil.  Avoid foods that are high in added sugar, added salt (sodium), saturated fat, and trans fat.  Avoid low-fiber, refined carbohydrates such as white bread, crackers, noodles, and white rice.  Avoid foods with partially hydrogenated oils  (trans fats), such as fried foods or stick margarine.  Limit alcohol intake to no more than 1 drink a day for nonpregnant women and 2 drinks a day for men. One drink equals 12 oz of beer, 5 oz of wine, or 1 oz of hard liquor. Your health care provider may recommend that you drink less depending on your overall health. Reading food labels  Check food labels for the amount of saturated fat. Choose foods with no or very little saturated fat.  Check food labels for the amount of trans fat. Choose foods with no trans fat.  Check food labels for the amount of cholesterol. Choose foods low in cholesterol. Ask your dietitian how much cholesterol you should have each day.  Check food labels for the amount of sodium. Choose foods with less than 140 milligrams (mg) per serving. Shopping  Buy dairy products labeled as nonfat (skim) or low-fat (1%).  Avoid buying processed or prepackaged foods. These are often high in added sugar, sodium, and fat. Cooking  Choose healthy fats when cooking, such as olive oil or canola oil.  Cook foods using lower fat methods, such as baking, broiling, boiling, or grilling.  Make your own sauces, dressings, and marinades when possible, instead of buying them. Store-bought sauces, dressings, and marinades are often high in sodium and sugar. Meal planning  Eat more home-cooked food and less restaurant, buffet, and fast food.  Eat fatty fish at least 2 times each week. Examples of fatty fish include salmon, trout, mackerel, tuna, and herring.  If you eat whole eggs, do not eat more than 3 egg yolks per week.  What foods are recommended? The items listed may not be a complete list. Talk with your dietitian about what dietary choices are best for you. Grains Whole wheat or whole grain breads, crackers, cereals, and pasta. Unsweetened oatmeal. Bulgur. Barley. Quinoa. Brown rice. Whole wheat flour tortillas. Vegetables Fresh or frozen vegetables. Low-sodium canned  vegetables. Fruits All fresh, canned (in natural juice), or frozen fruits. Meats and other protein foods Skinless chicken or [place]. Ground chicken or [place]. Lean cuts of pork, trimmed of fat. Fish and seafood, especially salmon, trout, and herring. Egg whites. Dried beans, peas, or lentils. Unsalted nuts or seeds. Unsalted canned beans. Natural peanut or almond butter. Dairy Low-fat dairy products. Skim or low-fat (1%) milk. Reduced fat (2%) and low-sodium cheese. Low-fat ricotta cheese. Low-fat cottage cheese. Plain, low-fat yogurt. Fats and oils Tub margarine without trans fats. Light or reduced-fat mayonnaise. Light or reduced-fat salad dressings. Avocado. Safflower, olive, sunflower, soybean, and canola oils.  What foods are not recommended? The items listed may not be a complete list. Talk with your dietitian about what dietary choices are best for you. Grains White bread. White (regular) pasta. White rice. Cornbread. Bagels. Pastries. Crackers that contain trans fat. Vegetables Creamed or fried vegetables. Vegetables in a cheese sauce. Fruits Sweetened dried fruit. Canned fruit in syrup. Fruit juice. Meats and other protein foods Fatty cuts of meat. Ribs. Chicken wings. Berniece Salines. Sausage. Bologna. Salami. Chitterlings. Fatback. Hot dogs. Bratwurst. Packaged lunch meats. Dairy Whole or reduced-fat (2%) milk. Half-and-half. Cream cheese. Full-fat or sweetened yogurt. Full-fat cheese. Nondairy creamers. Whipped toppings. Processed cheese  or cheese spreads. Cheese curds. Beverages Alcohol. Sweetened drinks, such as soda, lemonade, fruit drinks, or punches. Fats and oils Butter. Stick margarine. Lard. Shortening. Ghee. Bacon fat. Tropical oils, such as coconut, palm kernel, or palm oils. Sweets and desserts Corn syrup. Sugars. Honey. Molasses. Candy. Jam and jelly. Syrup. Sweetened cereals. Cookies. Pies. Cakes. Donuts. Muffins. Ice cream. Condiments Store-bought sauces, dressings, and  marinades that are high in sugar, such as ketchup and barbecue sauce.  Summary  High levels of triglycerides can increase the risk of heart disease and stroke. Choosing the right foods can help lower your triglycerides.  Eat plenty of fresh fruits, vegetables, and whole grains. Choose low-fat dairy and lean meats. Eat fatty fish at least twice a week.  Avoid processed and prepackaged foods with added sugar, sodium, saturated fat, and trans fat.  If you need suggestions or have questions about what types of food are good for you, talk with your health care provider or a dietitian.  This information is not intended to replace advice given to you by your health care provider. Make sure you discuss any questions you have with your health care provider. Document Revised: 02/25/2017 Document Reviewed: 05/18/2016 Elsevier Patient Education  2020 [employer].

## 2019-06-18 NOTE — Progress Notes (Signed)
  I have reviewed this encounter including the documentation in this note and/or discussed this patient with the care management provider. I am certifying that I agree with the content of this note as supervising physician.  Binnie Rail, MD   06/18/2019

## 2019-06-18 NOTE — Chronic Care Management (AMB) (Signed)
Chronic Care Management Pharmacy  Name: Tiffany Hood  MRN: [id number] DOB: [birth year]   Chief Complaint/ HPI  Tiffany Hood,  79 y.o. , female presents for their Initial CCM visit with the clinical pharmacist via telephone due to COVID-19 Pandemic.  PCP : Binnie Rail, MD  Their chronic conditions include: HTN, DM, HLD, arthritis, hx breast cancer 06/13/2002), hx diverticulitis (removal of sigmoid colon)  Moved to [place] in the 1960s, from [place]. Husband died 06-12-01 of DM, son [place].  Gardening, stays active in warmer months. Short gut issues, can't go on long trips.   Office Visits: 04/10/19 Dr Quay Burow OV: chronic knee pain, has seen ortho, injections did not help. Discussed Voltaren gel, turmeric, NSAIDs, Tylenol. Triglycerides elevated, counseled diet changes and may consider med in future.  10/05/18 Dr Quay Burow OV: conditions stable, no med changes. US thyroid shows slight enlargement of thyroid w/ nodules, continue to monitor.   Consult Visit: 02/09/19 NP Causey (heme/onc): completed anastrozole x 10 yrs in 06/12/12. no evidence malignancy on recent mammogram.   09/08/18 Dr Guinevere Scarlet (ortho): via claims. Knee pain.  08/16/18 Dr Ronnald Ramp (dermatology): via claims data.   Medications: Outpatient Encounter Medications as of 06/18/2019  Medication Sig Note  . Ascorbic Acid (VITAMIN C) 1000 MG tablet Take 500 mg by mouth daily.   Marland Kitchen aspirin EC 81 MG tablet Take 81 mg by mouth daily. Pt takes on MWF   . Biotin w/ Vitamins C & E (HAIR SKIN & NAILS GUMMIES PO) Take 1 each by mouth daily.   Marland Kitchen bismuth subsalicylate (PEPTO BISMOL) 262 MG chewable tablet Chew 262 mg by mouth as needed for diarrhea or loose stools.   . Calcium Carbonate-Vitamin D (CALTRATE 600+D PO) Take 1 capsule by mouth daily.    . cycloSPORINE (RESTASIS) 0.05 % ophthalmic emulsion    . Ibuprofen-diphenhydrAMINE HCl (ADVIL PM) 200-25 MG CAPS Take 1 capsule by mouth at bedtime as needed.   . Lancets  (ONETOUCH ULTRASOFT) lancets USE AS INSTRUCTED TO CHECK  BLOOD SUGARS DAILY   . losartan (COZAAR) 50 MG tablet TAKE 1 TABLET BY MOUTH  DAILY   . Melatonin 5 MG TABS Take 5 mg by mouth daily.   . meloxicam (MOBIC) 7.5 MG tablet Take 1 tablet (7.5 mg total) by mouth daily.   . metroNIDAZOLE (METROGEL) 0.75 % gel    . Multiple Vitamins-Minerals (OCUVITE ADULT 50+ PO) Take by mouth daily.   . Multiple Vitamins-Minerals (PRESERVISION AREDS PO) Take by mouth.   . Omega-3 Fatty Acids (FISH OIL) 1000 MG CAPS Take 1 capsule by mouth daily.   . ONE TOUCH ULTRA TEST test strip USE TO CHECK BLOOD SUGAR  DAILY   . Polyethyl Glycol-Propyl Glycol (SYSTANE) 0.4-0.3 % SOLN Place 1 drop into both eyes every morning. 02/02/2018: Pt uses 4-5 times daily  . pravastatin (PRAVACHOL) 40 MG tablet TAKE 1 TABLET BY MOUTH  DAILY   . triamterene-hydrochlorothiazide (MAXZIDE) 75-50 MG tablet TAKE ONE-HALF TABLET BY  MOUTH DAILY    No facility-administered encounter medications on file as of 06/18/2019.     Current Diagnosis/Assessment:  Goals Addressed            This Visit's Progress   . Pharmacy Care Plan       CARE PLAN ENTRY  Current Barriers:  . Chronic Disease Management support, education, and care coordination needs related to HTN, HLD, and DMII  Pharmacist Clinical Goal(s):  Marland Kitchen Maintain BP < 130/80 .  Maintain LDL < 100 and triglycerides < 150 . Maintain A1c < 7.0% . Ensure safety, efficacy, and affordability of medications  Interventions: . Comprehensive medication review performed. . May discontinue aspirin 81 mg due to lack of benefits in healthy people over 70 and increased risk for bleeding . Recommend over-the-counter fish oil 1000 mg daily for triglycerides. Store in the freezer to prevent fishy taste. . Recommend utilizing insurance benefit for OTC items . Recommend checking blood pressure and blood sugar a few times per week.  . Monitor for dizziness, check BP if feeling dizzy because BP  may be too low.   Patient Self Care Activities:  . Self administers medications as prescribed, Calls pharmacy for medication refills, and Calls provider office for new concerns or questions  Initial goal documentation       Diabetes   Diet-controlled, no medications indicated.  Recent Relevant Labs: Lab Results  Component Value Date/Time   HGBA1C 6.6 (H) 04/10/2019 10:34 AM   HGBA1C 6.7 (H) 10/05/2018 09:44 AM   EGFR 78 (L) 01/30/2015 02:06 PM   MICROALBUR 1.0 08/14/2015 08:44 AM   MICROALBUR 1.3 05/10/2014 09:34 AM    Checking BG: Weekly  Recent FBG Readings: 87 - 120  Last diabetic Foot exam:  Lab Results  Component Value Date/Time   HMDIABEYEEXA No Retinopathy 09/15/2017 12:00 AM    Last diabetic Eye exam: No results found for: HMDIABFOOTEX   We discussed: diet and exercise extensively  Plan  Continue control with diet and exercise    Hypertension   Office blood pressures are  BP Readings from Last 3 Encounters:  04/10/19 134/72  02/09/19 (!) 130/58  10/05/18 124/62    Patient has failed these meds in the past:  Patient is currently controlled on the following medications: losartan 50 mg daily, triamterene/HCTZ 75-50 mg 1/2 tab daily  Patient checks BP at home infrequently  Patient home BP readings are ranging: 120-130/60-70s  We discussed diet and exercise extensively, blood pressure goals, consequences of elevated BP, proper BP monitoring technique. Also discussed low blood pressure and monitoring for dizziness.  Plan  Continue current medications and control with diet and exercise  Recommend monitoring BP at home a few days per week  Hyperlipidemia   Lipid Panel     Component Value Date/Time   CHOL 207 (H) 04/10/2019 1034   TRIG 309.0 (H) 04/10/2019 1034   HDL 60.60 04/10/2019 1034   CHOLHDL 3 04/10/2019 1034   VLDL 61.8 (H) 04/10/2019 1034   LDLDIRECT 103.0 04/10/2019 1034     The 10-year ASCVD risk score Mikey Bussing DC Jr., et al., 2013)  is: 47.1%   Values used to calculate the score:     Age: 79 years     Sex: Female     Is Non-Hispanic African American: No     Diabetic: Yes     Tobacco smoker: No     Systolic Blood Pressure: 035 mmHg     Is BP treated: Yes     HDL Cholesterol: 60.6 mg/dL     Total Cholesterol: 207 mg/dL   Patient has failed these meds in past: n/a Patient is currently controlled on the following medications: pravastatin 40 mg daily, aspirin 81 mg MWF  We discussed:  diet and exercise extensively, consequences of elevated cholesterol and triglycerides, role of statin in ASCVD prevention. Pt used to take fish oil but does not take currently. Discussed benefits/risk of aspirin for primary prevention, pt has had issues with nosebleeds requiring intervention in  the past, which is why she takes it 3x per week rather than daily. Given risks may outweigh benfits, pt agreed to stop aspirin.   Plan  Continue current medications and control with diet and exercise  Recommend d/c aspirin 81 mg daily Recommend OTC fish oil 1000 mg daily   Osteoarthritis   Patient has failed these meds in past: n/a Patient is currently controlled on the following medications: meloxicam 7.5 mg PRN, OTC Voltaren gel  We discussed: pt uses NSAID sparingly due to GI upset even with food. She uses Voltaren gel on her knee and on her hands at night, reports it helps a lot.  Plan  Continue current medications    Osteopenia   Patient has failed these meds in past: n/a Patient is currently controlled on the following medications: calcium carbonate-Vitamin D daily  We discussed:  Pt endorses compliance with Ca/Vit D supplement  Plan  Continue current medications    Health Maintenance   Patient is currently controlled on the following medications: multivitamin, Restasis 0.05% eye drops, Systane eye drops, Vitamin C, Hair skin and nails, Preservision, melatonin, Pepto Bismol daily  We discussed:  Pt is satisfied with OTC  regimen. Her insurance provided OTC medications at no cost through mail order, she plans to set this up.  Plan  Continue current medications  Recommend using insurance benefit for OTC products   Medication Management   Pt uses [facility] mail order pharmacy for all medications Does not use pill box - separates by AM and PM  Pt endorses 100% compliance  We discussed:  Pt is managing meds well on her own, very rarely misses doses, re-orders meds online through mail order and denies issues. She currently gets all generics for $0.   Plan  Continue current med management strategy     Follow up: 3 month phone visit  Charlene Brooke, PharmD Clinical Pharmacist [facility] at [facility] [phone]

## 2019-06-21 NOTE — Addendum Note (Signed)
Addended by: Karle Barr on: 06/21/2019 05:28 AM   Modules accepted: Orders

## 2019-06-25 DIAGNOSIS — H401121 Primary open-angle glaucoma, left eye, mild stage: Secondary | ICD-10-CM | POA: Diagnosis not present

## 2019-07-16 ENCOUNTER — Other Ambulatory Visit: Payer: Self-pay | Admitting: Internal Medicine

## 2019-07-16 ENCOUNTER — Ambulatory Visit: Payer: Self-pay | Admitting: Pharmacist

## 2019-07-16 DIAGNOSIS — E119 Type 2 diabetes mellitus without complications: Secondary | ICD-10-CM

## 2019-07-16 DIAGNOSIS — I10 Essential (primary) hypertension: Secondary | ICD-10-CM

## 2019-07-16 MED ORDER — LOSARTAN POTASSIUM 50 MG PO TABS: 50.0000 mg | ORAL_TABLET | Freq: Every day | ORAL | 1 refills | Status: DC

## 2019-07-16 NOTE — Chronic Care Management (AMB) (Signed)
  Chronic Care Management   Outreach Note  07/16/2019 Name: Tiffany Hood MRN: [id number] DOB: 1941/03/27  Referred by: Binnie Rail, MD Reason for referral : Chronic Care Management (Losartan back order)  Received phone call from patient that losartan 50 mg is on back order at [facility]. [facility] does have losartan 50 in stock, will coordinate with PCP to fill 90 day rx so patient does not have gap in care.  Charlene Brooke, PharmD Clinical Pharmacist [facility] at [facility] [phone]

## 2019-07-17 ENCOUNTER — Other Ambulatory Visit: Payer: Self-pay

## 2019-07-17 MED ORDER — LOSARTAN POTASSIUM 50 MG PO TABS: 50.0000 mg | ORAL_TABLET | Freq: Every day | ORAL | 1 refills | Status: DC

## 2019-08-06 DIAGNOSIS — H401112 Primary open-angle glaucoma, right eye, moderate stage: Secondary | ICD-10-CM | POA: Diagnosis not present

## 2019-08-06 DIAGNOSIS — H401121 Primary open-angle glaucoma, left eye, mild stage: Secondary | ICD-10-CM | POA: Diagnosis not present

## 2019-08-16 DIAGNOSIS — L570 Actinic keratosis: Secondary | ICD-10-CM | POA: Diagnosis not present

## 2019-08-16 DIAGNOSIS — L821 Other seborrheic keratosis: Secondary | ICD-10-CM | POA: Diagnosis not present

## 2019-08-22 ENCOUNTER — Other Ambulatory Visit: Payer: Self-pay | Admitting: Adult Health

## 2019-08-22 DIAGNOSIS — Z1231 Encounter for screening mammogram for malignant neoplasm of breast: Secondary | ICD-10-CM

## 2019-09-04 ENCOUNTER — Telehealth: Payer: Self-pay

## 2019-09-04 ENCOUNTER — Other Ambulatory Visit: Payer: Self-pay | Admitting: Adult Health

## 2019-09-04 DIAGNOSIS — E2839 Other primary ovarian failure: Secondary | ICD-10-CM

## 2019-09-04 NOTE — Telephone Encounter (Signed)
She called and left message requesting order for bone density. Mammogram is scheduled for July. They are now scheduling bone density in August.  Called back and left a message that Wilber Bihari, NP ordered the bone density. If she needs any further assistance please call the office back.

## 2019-09-13 ENCOUNTER — Telehealth: Payer: Medicare Other

## 2019-10-04 ENCOUNTER — Ambulatory Visit: Payer: Medicare Other

## 2019-10-08 NOTE — Progress Notes (Signed)
Subjective:    Patient ID: Tiffany Hood, female    DOB: 03-11-1941, 79 y.o.   MRN: [id number]  HPI She is here for a physical exam.   She has a bug bite in the posterior knee.  She pulled something off but is not sure if it was a tick or not.  It itched, had a knot and turned red.  This was one week ago.  It has gotten better, but she cannot see it well.  She is unsure what it was.  Medications and allergies reviewed with patient and updated if appropriate.  Patient Active Problem List   Diagnosis Date Noted  . Multiple thyroid nodules 08/12/2016  . Diabetes ([place]) 08/08/2015  . Spinal stenosis, lumbar region, with neurogenic claudication 02/19/2015  . Osteopenia 03/30/2013  . Diverticulitis of sigmoid colon s/p sigmoidectomy 10/05/2012  . Diverticulosis 10/19/2011  . Osteoarthritis 01/28/2010  . Hyperlipidemia 12/25/2007  . Essential hypertension 02/10/2007  . History of cancer of left breast 05/02/2002    Current Outpatient Medications on File Prior to Visit  Medication Sig Dispense Refill  . Ascorbic Acid (VITAMIN C) 1000 MG tablet Take 500 mg by mouth daily.    . Biotin w/ Vitamins C & E (HAIR SKIN & NAILS GUMMIES PO) Take 1 each by mouth daily.    Marland Kitchen bismuth subsalicylate (PEPTO BISMOL) 262 MG chewable tablet Chew 262 mg by mouth as needed for diarrhea or loose stools.    . Calcium Carbonate-Vitamin D (CALTRATE 600+D PO) Take 1 capsule by mouth daily.     . cycloSPORINE (RESTASIS) 0.05 % ophthalmic emulsion     . Ibuprofen-diphenhydrAMINE HCl (ADVIL PM) 200-25 MG CAPS Take 1 capsule by mouth at bedtime as needed.    . Lancets (ONETOUCH ULTRASOFT) lancets USE AS INSTRUCTED TO CHECK  BLOOD SUGARS DAILY 100 each 3  . losartan (COZAAR) 50 MG tablet Take 1 tablet (50 mg total) by mouth daily. 90 tablet 1  . Melatonin 5 MG TABS Take 5 mg by mouth daily.    . meloxicam (MOBIC) 7.5 MG tablet Take 1 tablet (7.5 mg total) by mouth daily. 90 tablet 1  . metroNIDAZOLE (METROGEL)  0.75 % gel     . Multiple Vitamins-Minerals (PRESERVISION AREDS PO) Take by mouth.    . Omega-3 Fatty Acids (FISH OIL) 1000 MG CAPS Take 1 capsule by mouth daily.    . ONE TOUCH ULTRA TEST test strip USE TO CHECK BLOOD SUGAR  DAILY 100 each 3  . Polyethyl Glycol-Propyl Glycol (SYSTANE) 0.4-0.3 % SOLN Place 1 drop into both eyes every morning.    . pravastatin (PRAVACHOL) 40 MG tablet TAKE 1 TABLET BY MOUTH  DAILY 90 tablet 1  . triamterene-hydrochlorothiazide (MAXZIDE) 75-50 MG tablet TAKE ONE-HALF TABLET BY  MOUTH DAILY 45 tablet 1   No current facility-administered medications on file prior to visit.    Past Medical History:  Diagnosis Date  . Breast cancer ([place]) left-2004 sx  . Diabetes mellitus    TYPE 2  . Diverticulitis    09/14/12  . Diverticulosis   . Heart murmur   . History of radiation therapy   . History of urinary tract infection   . Hyperlipidemia   . Hypertension   . Osteoporosis    Fosamax Rxed 2/13  . Personal history of colonic adenomas 11/15/2012  . Personal history of radiation therapy 06/2002  . Pneumonia    2013  . Seasonal allergies   . Tingling  right leg     Past Surgical History:  Procedure Laterality Date  . ABDOMINAL HYSTERECTOMY  1965   ovaries remain; for dysfunctional menses; ? endometriosis  . ABDOMINAL SURGERY    . BREAST BIOPSY Left 04/16/2002   malignant  . BREAST LUMPECTOMY Left   . colon polyps  2014   Dr Carlean Purl  . COLONOSCOPY  2004    Tics  . DILATION AND CURETTAGE OF UTERUS    . LAPAROSCOPIC SIGMOID COLECTOMY  04/13/2013   DR TOTH  . LAPAROSCOPIC SIGMOID COLECTOMY N/A 04/13/2013   Procedure: LAPAROSCOPIC ASSISTED SIGMOID COLECTOMY;  Surgeon: Merrie Roof, MD;  Location: [facility];  Service: General;  Laterality: N/A;  . LUMBAR LAMINECTOMY/DECOMPRESSION MICRODISCECTOMY N/A 02/19/2015   Procedure: MICRODISCECTOMY AT L4-L5, CENTRAL DECOMPRESSION L4-L5 FOR SPINAL STENOSIS, FORAMINOTOMY FOR L4 L5 ROOT ON RIGHT;  Surgeon: Latanya Maudlin, MD;  Location: [facility];  Service: Orthopedics;  Laterality: N/A;  . removal bone spur Left 2/14   2nd finger  . TONSILLECTOMY AND ADENOIDECTOMY      Social History   Socioeconomic History  . Marital status: Widowed    Spouse name: Not on file  . Number of children: Not on file  . Years of education: Not on file  . Highest education level: Not on file  Occupational History  . Not on file  Tobacco Use  . Smoking status: Never Smoker  . Smokeless tobacco: Never Used  Vaping Use  . Vaping Use: Never used  Substance and Sexual Activity  . Alcohol use: No    Alcohol/week: 0.0 standard drinks  . Drug use: No  . Sexual activity: Not on file  Other Topics Concern  . Not on file  Social History Narrative   Exercise: no   Social Determinants of Health   Financial Resource Strain:   . Difficulty of Paying Living Expenses:   Food Insecurity:   . Worried About Charity fundraiser in the Last Year:   . Arboriculturist in the Last Year:   Transportation Needs:   . Film/video editor (Medical):   Marland Kitchen Lack of Transportation (Non-Medical):   Physical Activity:   . Days of Exercise per Week:   . Minutes of Exercise per Session:   Stress:   . Feeling of Stress :   Social Connections:   . Frequency of Communication with Friends and Family:   . Frequency of Social Gatherings with Friends and Family:   . Attends Religious Services:   . Active Member of Clubs or Organizations:   . Attends Archivist Meetings:   Marland Kitchen Marital Status:     Family History  Problem Relation Age of Onset  . Stroke Mother 40  . Hypertension Mother   . Lung cancer Father        smoker  . Cancer - Lung Father   . Hypertension Brother   . Heart disease Brother 6  . Stroke Brother 42  . Hypertension Brother   . Hypertension Sister   . Diabetes Neg Hx   . Colon cancer Neg Hx   . Esophageal cancer Neg Hx   . Rectal cancer Neg Hx   . Stomach cancer Neg Hx   . Colon polyps Neg Hx      Review of Systems  Constitutional: Negative for chills and fever.  Eyes: Negative for visual disturbance.  Respiratory: Negative for cough, shortness of breath and wheezing.   Cardiovascular: Negative for chest pain, palpitations and leg  swelling.  Gastrointestinal: Negative for abdominal pain, blood in stool, constipation, diarrhea and nausea.       No gerd, freq BM's due to short track  Genitourinary: Negative for dysuria and hematuria.  Musculoskeletal: Positive for arthralgias (R knee).  Skin: Negative for rash.  Neurological: Positive for headaches (occ). Negative for light-headedness.  Psychiatric/Behavioral: Negative for dysphoric mood. The patient is not nervous/anxious.        Objective:   Vitals:   10/09/19 1039  BP: 138/84  Pulse: 82  Temp: 98 F (36.7 C)  SpO2: 97%   Filed Weights   10/09/19 1039  Weight: 160 lb (72.6 kg)   Body mass index is 28.8 kg/m.  BP Readings from Last 3 Encounters:  10/09/19 138/84  04/10/19 134/72  02/09/19 (!) 130/58    Wt Readings from Last 3 Encounters:  10/09/19 160 lb (72.6 kg)  04/10/19 163 lb (73.9 kg)  02/09/19 168 lb 12.8 oz (76.6 kg)     Physical Exam Constitutional: She appears well-developed and well-nourished. No distress.  HENT:  Head: Normocephalic and atraumatic.  Right Ear: External ear normal. Normal ear canal and TM Left Ear: External ear normal.  Normal ear canal and TM Mouth/Throat: Oropharynx is clear and moist.  Eyes: Conjunctivae and EOM are normal.  Neck: Neck supple. No tracheal deviation present. No thyromegaly present.  No carotid bruit  Cardiovascular: Normal rate, regular rhythm and normal heart sounds.   No murmur heard.  No edema. Pulmonary/Chest: Effort normal and breath sounds normal. No respiratory distress. She has no wheezes. She has no rales.  Breast: deferred   Abdominal: Soft. She exhibits no distension. There is no tenderness.  Lymphadenopathy: She has no cervical  adenopathy.  Skin: Skin is warm and dry. She is not diaphoretic.  Psychiatric: She has a normal mood and affect. Her behavior is normal.        Assessment & Plan:   Physical exam: Screening blood work    ordered Immunizations   Had covid, discussed shingrix Colonoscopy  Aged out Mammogram  scheduled Gyn  - no longer seeing Dexa  scheduled Eye exams  Up to date  Exercise  - gardening, yard work, unable to walk due to R knee pain Weight  Ok for age Substance abuse  none Sees derm annually   See Problem List for Assessment and Plan of chronic medical problems.   This visit occurred during the SARS-CoV-2 public health emergency.  Safety protocols were in place, including screening questions prior to the visit, additional usage of staff PPE, and extensive cleaning of exam room while observing appropriate contact time as indicated for disinfecting solutions.

## 2019-10-08 NOTE — Patient Instructions (Addendum)
Blood work was ordered.    All other Health Maintenance issues reviewed.   All recommended immunizations and age-appropriate screenings are up-to-date or discussed.  No immunization administered today.   Medications reviewed and updated.  Changes include :   none    A thyroid ultrasound was ordered.    Please followup in 6 months    Health Maintenance, Female Adopting a healthy lifestyle and getting preventive care are important in promoting health and wellness. Ask your health care provider about:  The right schedule for you to have regular tests and exams.  Things you can do on your own to prevent diseases and keep yourself healthy. What should I know about diet, weight, and exercise? Eat a healthy diet   Eat a diet that includes plenty of vegetables, fruits, low-fat dairy products, and lean protein.  Do not eat a lot of foods that are high in solid fats, added sugars, or sodium. Maintain a healthy weight Body mass index (BMI) is used to identify weight problems. It estimates body fat based on height and weight. Your health care provider can help determine your BMI and help you achieve or maintain a healthy weight. Get regular exercise Get regular exercise. This is one of the most important things you can do for your health. Most adults should:  Exercise for at least 150 minutes each week. The exercise should increase your heart rate and make you sweat (moderate-intensity exercise).  Do strengthening exercises at least twice a week. This is in addition to the moderate-intensity exercise.  Spend less time sitting. Even light physical activity can be beneficial. Watch cholesterol and blood lipids Have your blood tested for lipids and cholesterol at 78 years of age, then have this test every 5 years. Have your cholesterol levels checked more often if:  Your lipid or cholesterol levels are high.  You are older than 78 years of age.  You are at high risk for heart  disease. What should I know about cancer screening? Depending on your health history and family history, you may need to have cancer screening at various ages. This may include screening for:  Breast cancer.  Cervical cancer.  Colorectal cancer.  Skin cancer.  Lung cancer. What should I know about heart disease, diabetes, and high blood pressure? Blood pressure and heart disease  High blood pressure causes heart disease and increases the risk of stroke. This is more likely to develop in people who have high blood pressure readings, are of African descent, or are overweight.  Have your blood pressure checked: ? Every 3-5 years if you are 37-84 years of age. ? Every year if you are 49 years old or older. Diabetes Have regular diabetes screenings. This checks your fasting blood sugar level. Have the screening done:  Once every three years after age 26 if you are at a normal weight and have a low risk for diabetes.  More often and at a younger age if you are overweight or have a high risk for diabetes. What should I know about preventing infection? Hepatitis B If you have a higher risk for hepatitis B, you should be screened for this virus. Talk with your health care provider to find out if you are at risk for hepatitis B infection. Hepatitis C Testing is recommended for:  Everyone born from [birth year] through 1964.  Anyone with known risk factors for hepatitis C. Sexually transmitted infections (STIs)  Get screened for STIs, including gonorrhea and chlamydia, if: ? You are sexually  active and are younger than 78 years of age. ? You are older than 78 years of age and your health care provider tells you that you are at risk for this type of infection. ? Your sexual activity has changed since you were last screened, and you are at increased risk for chlamydia or gonorrhea. Ask your health care provider if you are at risk.  Ask your health care provider about whether you are at high  risk for HIV. Your health care provider may recommend a prescription medicine to help prevent HIV infection. If you choose to take medicine to prevent HIV, you should first get tested for HIV. You should then be tested every 3 months for as long as you are taking the medicine. Pregnancy  If you are about to stop having your period (premenopausal) and you may become pregnant, seek counseling before you get pregnant.  Take 400 to 800 micrograms (mcg) of folic acid every day if you become pregnant.  Ask for birth control (contraception) if you want to prevent pregnancy. Osteoporosis and menopause Osteoporosis is a disease in which the bones lose minerals and strength with aging. This can result in bone fractures. If you are 37 years old or older, or if you are at risk for osteoporosis and fractures, ask your health care provider if you should:  Be screened for bone loss.  Take a calcium or vitamin D supplement to lower your risk of fractures.  Be given hormone replacement therapy (HRT) to treat symptoms of menopause. Follow these instructions at home: Lifestyle  Do not use any products that contain nicotine or tobacco, such as cigarettes, e-cigarettes, and chewing tobacco. If you need help quitting, ask your health care provider.  Do not use street drugs.  Do not share needles.  Ask your health care provider for help if you need support or information about quitting drugs. Alcohol use  Do not drink alcohol if: ? Your health care provider tells you not to drink. ? You are pregnant, may be pregnant, or are planning to become pregnant.  If you drink alcohol: ? Limit how much you use to 0-1 drink a day. ? Limit intake if you are breastfeeding.  Be aware of how much alcohol is in your drink. In the [place] one drink equals one 12 oz bottle of beer (355 mL), one 5 oz glass of wine (148 mL), or one 1 oz glass of hard liquor (44 mL). General instructions  Schedule regular health, dental,  and eye exams.  Stay current with your vaccines.  Tell your health care provider if: ? You often feel depressed. ? You have ever been abused or do not feel safe at home. Summary  Adopting a healthy lifestyle and getting preventive care are important in promoting health and wellness.  Follow your health care provider's instructions about healthy diet, exercising, and getting tested or screened for diseases.  Follow your health care provider's instructions on monitoring your cholesterol and blood pressure. This information is not intended to replace advice given to you by your health care provider. Make sure you discuss any questions you have with your health care provider. Document Revised: 03/08/2018 Document Reviewed: 03/08/2018 Elsevier Patient Education  2020 [employer].

## 2019-10-09 ENCOUNTER — Other Ambulatory Visit: Payer: Self-pay

## 2019-10-09 ENCOUNTER — Encounter: Payer: Self-pay | Admitting: Internal Medicine

## 2019-10-09 ENCOUNTER — Ambulatory Visit (INDEPENDENT_AMBULATORY_CARE_PROVIDER_SITE_OTHER): Payer: Medicare Other | Admitting: Internal Medicine

## 2019-10-09 VITALS — BP 138/84 | HR 82 | Temp 98.0°F | Ht 62.5 in | Wt 160.0 lb

## 2019-10-09 DIAGNOSIS — Z0000 Encounter for general adult medical examination without abnormal findings: Secondary | ICD-10-CM | POA: Diagnosis not present

## 2019-10-09 DIAGNOSIS — E042 Nontoxic multinodular goiter: Secondary | ICD-10-CM | POA: Diagnosis not present

## 2019-10-09 DIAGNOSIS — E119 Type 2 diabetes mellitus without complications: Secondary | ICD-10-CM | POA: Diagnosis not present

## 2019-10-09 DIAGNOSIS — E7849 Other hyperlipidemia: Secondary | ICD-10-CM | POA: Diagnosis not present

## 2019-10-09 DIAGNOSIS — I10 Essential (primary) hypertension: Secondary | ICD-10-CM

## 2019-10-09 DIAGNOSIS — M85839 Other specified disorders of bone density and structure, unspecified forearm: Secondary | ICD-10-CM

## 2019-10-09 NOTE — Assessment & Plan Note (Signed)
Chronic Check lipid panel  Continue daily statin Regular exercise and healthy diet encouraged  

## 2019-10-09 NOTE — Assessment & Plan Note (Signed)
Chronic She denies any changes in her thyroid Due for follow-up thyroid ultrasound-ordered TSH

## 2019-10-09 NOTE — Assessment & Plan Note (Signed)
Chronic BP well controlled Current regimen effective and well tolerated Continue current medications at current doses cmp  

## 2019-10-09 NOTE — Assessment & Plan Note (Signed)
Chronic dexa scheduled Taking calcium and vitamin d active

## 2019-10-09 NOTE — Assessment & Plan Note (Signed)
Chronic Diet controlled Check A1c Diabetic diet, regular exercise stressed

## 2019-10-10 LAB — LIPID PANEL
Cholesterol: 223 mg/dL — ABNORMAL HIGH (ref <200)
HDL: 57 mg/dL (ref >=50)
Non-HDL Cholesterol (Calc): 166 mg/dL (calc) — ABNORMAL HIGH (ref <130)
Total CHOL/HDL Ratio: 3.9 (calc) (ref <5.0)
Triglycerides: 404 mg/dL — ABNORMAL HIGH (ref <150)

## 2019-10-10 LAB — CBC WITH DIFFERENTIAL/PLATELET
Absolute Monocytes: 691 cells/uL (ref 200–950)
Basophils Absolute: 50 cells/uL (ref 0–200)
Basophils Relative: 0.7 %
Eosinophils Absolute: 338 cells/uL (ref 15–500)
Eosinophils Relative: 4.7 %
HCT: 41.7 % (ref 35.0–45.0)
Hemoglobin: 13.8 g/dL (ref 11.7–15.5)
Lymphs Abs: 1987 cells/uL (ref 850–3900)
MCH: 28.5 pg (ref 27.0–33.0)
MCHC: 33.1 g/dL (ref 32.0–36.0)
MCV: 86.2 fL (ref 80.0–100.0)
MPV: 11.7 fL (ref 7.5–12.5)
Monocytes Relative: 9.6 %
Neutro Abs: 4133 cells/uL (ref 1500–7800)
Neutrophils Relative %: 57.4 %
Platelets: 231 10*3/uL (ref 140–400)
RBC: 4.84 10*6/uL (ref 3.80–5.10)
RDW: 14.4 % (ref 11.0–15.0)
Total Lymphocyte: 27.6 %
WBC: 7.2 10*3/uL (ref 3.8–10.8)

## 2019-10-10 LAB — COMPREHENSIVE METABOLIC PANEL
AG Ratio: 1.6 (calc) (ref 1.0–2.5)
ALT: 17 U/L (ref 6–29)
AST: 18 U/L (ref 10–35)
Albumin: 4.4 g/dL (ref 3.6–5.1)
Alkaline phosphatase (APISO): 69 U/L (ref 37–153)
BUN: 19 mg/dL (ref 7–25)
CO2: 27 mmol/L (ref 20–32)
Calcium: 10 mg/dL (ref 8.6–10.4)
Chloride: 100 mmol/L (ref 98–110)
Creat: 0.64 mg/dL (ref 0.60–0.93)
Globulin: 2.7 g/dL (calc) (ref 1.9–3.7)
Glucose, Bld: 80 mg/dL (ref 65–99)
Potassium: 4.2 mmol/L (ref 3.5–5.3)
Sodium: 138 mmol/L (ref 135–146)
Total Bilirubin: 0.4 mg/dL (ref 0.2–1.2)
Total Protein: 7.1 g/dL (ref 6.1–8.1)

## 2019-10-10 LAB — HEMOGLOBIN A1C
Hgb A1c MFr Bld: 6.2 % of total Hgb — ABNORMAL HIGH (ref <5.7)
Mean Plasma Glucose: 131 (calc)
eAG (mmol/L): 7.3 (calc)

## 2019-10-10 LAB — TSH: TSH: 1.41 mIU/L (ref 0.40–4.50)

## 2019-10-17 ENCOUNTER — Telehealth: Payer: Medicare Other

## 2019-10-18 ENCOUNTER — Other Ambulatory Visit: Payer: Self-pay

## 2019-10-18 ENCOUNTER — Ambulatory Visit: Payer: Medicare Other | Admitting: Pharmacist

## 2019-10-18 DIAGNOSIS — E7849 Other hyperlipidemia: Secondary | ICD-10-CM

## 2019-10-18 DIAGNOSIS — E119 Type 2 diabetes mellitus without complications: Secondary | ICD-10-CM

## 2019-10-18 DIAGNOSIS — I10 Essential (primary) hypertension: Secondary | ICD-10-CM

## 2019-10-18 NOTE — Chronic Care Management (AMB) (Signed)
Chronic Care Management Pharmacy  Name: Tiffany Hood  MRN: [id number] DOB: [birth year]   Chief Complaint/ HPI  Tiffany Hood,  79 y.o. , female presents for their Follow-Up CCM visit with the clinical pharmacist via telephone due to COVID-19 Pandemic.  PCP : Binnie Rail, MD  Patient Care Team: Binnie Rail, MD as PCP - General (Internal Medicine) Charlton Haws, [facility] as Pharmacist (Pharmacist) Delice Bison Charlestine Massed, NP as Nurse Practitioner (Hematology and Oncology)  Their chronic conditions include:Hypertension, Hyperlipidemia, Diabetes and Osteoarthritis, osteopenia, hx breast cancer 06/23/2002), hx diverticulitis (removal of sigmoid colon)  Subjective: Pt moved to [place] in the 1960s, from [place]. Husband died 06-22-2001 of DM, son Tiffany Hood She enjoys gardening, stays active in warmer months. Due to short gut issues, can't go on long trips.   Office Visits: 10/09/19 Dr Quay Burow OV:  No med changes. Cholesterol/Trig worsened since Jan.   04/10/19 Dr Quay Burow OV: chronic knee pain, has seen ortho, injections did not help. Discussed Voltaren gel, turmeric, NSAIDs, Tylenol. Triglycerides elevated, counseled diet changes and may consider med in future.  10/05/18 Dr Quay Burow OV: conditions stable, no med changes. US thyroid shows slight enlargement of thyroid w/ nodules, continue to monitor.   Consult Visit: 02/09/19 NP Causey (heme/onc): completed anastrozole x 10 yrs in 2012-06-22. no evidence malignancy on recent mammogram.   09/08/18 Dr Guinevere Scarlet (ortho): via claims. Knee pain.  08/16/18 Dr Ronnald Ramp (dermatology): via claims data.   Medications: Outpatient Encounter Medications as of 10/18/2019  Medication Sig Note  . Ascorbic Acid (VITAMIN C) 1000 MG tablet Take 500 mg by mouth daily.   . Biotin w/ Vitamins C & E (HAIR SKIN & NAILS GUMMIES PO) Take 1 each by mouth daily.   Marland Kitchen bismuth subsalicylate (PEPTO BISMOL) 262 MG chewable tablet Chew 262 mg by mouth as needed  for diarrhea or loose stools.   . Calcium Carbonate-Vitamin D (CALTRATE 600+D PO) Take 1 capsule by mouth daily.    . cycloSPORINE (RESTASIS) 0.05 % ophthalmic emulsion    . Ibuprofen-diphenhydrAMINE HCl (ADVIL PM) 200-25 MG CAPS Take 1 capsule by mouth at bedtime as needed.   . Lancets (ONETOUCH ULTRASOFT) lancets USE AS INSTRUCTED TO CHECK  BLOOD SUGARS DAILY   . losartan (COZAAR) 50 MG tablet Take 1 tablet (50 mg total) by mouth daily.   . Melatonin 5 MG TABS Take 5 mg by mouth daily.   . meloxicam (MOBIC) 7.5 MG tablet Take 1 tablet (7.5 mg total) by mouth daily.   . metroNIDAZOLE (METROGEL) 0.75 % gel    . Multiple Vitamins-Minerals (PRESERVISION AREDS PO) Take by mouth.   . Omega-3 Fatty Acids (FISH OIL) 1000 MG CAPS Take 1 capsule by mouth daily.   . ONE TOUCH ULTRA TEST test strip USE TO CHECK BLOOD SUGAR  DAILY   . Polyethyl Glycol-Propyl Glycol (SYSTANE) 0.4-0.3 % SOLN Place 1 drop into both eyes every morning. 02/02/2018: Pt uses 4-5 times daily  . pravastatin (PRAVACHOL) 40 MG tablet TAKE 1 TABLET BY MOUTH  DAILY   . triamterene-hydrochlorothiazide (MAXZIDE) 75-50 MG tablet TAKE ONE-HALF TABLET BY  MOUTH DAILY    No facility-administered encounter medications on file as of 10/18/2019.     Current Diagnosis/Assessment:  SDOH Interventions     Most Recent Value  SDOH Interventions  Financial Strain Interventions Intervention Not Indicated     Goals Addressed            This Visit's Progress   .  Pharmacy Care Plan       CARE PLAN ENTRY (see longitudinal plan of care for additional care plan information)  Current Barriers:  . Chronic Disease Management support, education, and care coordination needs related to Hypertension, Hyperlipidemia, and Diabetes   Hypertension BP Readings from Last 3 Encounters:  10/09/19 138/84  04/10/19 134/72  02/09/19 (!) 130/58 .  Pharmacist Clinical Goal(s): o Over the next 180 days, patient will work with PharmD and providers to  maintain BP goal <140/90 . Current regimen:  o losartan 50 mg daily, o triamterene/HCTZ 75-50 mg 1/2 tab daily . Interventions: o Discussed BP goals and benefits of medication for prevention of heart attack / stroke . Patient self care activities - Over the next 180 days, patient will: o Check BP 1-2 times weekly, document, and provide at future appointments o Ensure daily salt intake < 2300 mg/day  Hyperlipidemia Lab Results  Component Value Date/Time   LDLDIRECT 103.0 04/10/2019 10:34 AM   TRIG 404 (H) 10/09/2019 11:23 AM   TRIG 251 (H) 05/10/2014 09:34 AM .  Pharmacist Clinical Goal(s): o Over the next 180 days, patient will work with PharmD and providers to achieve LDL goal < 100 and Trig < 200 . Current regimen:  o pravastatin 40 mg daily,  o OTC fish oil 1000 mg daily . Interventions: o Discussed cholesterol goals and benefits of medication for prevention of heart attack / stroke . Patient self care activities - Over the next 180 days, patient will: o Continue medications as directed o Reduce cholesterol in diet (see attached)  Diabetes Lab Results  Component Value Date/Time   HGBA1C 6.2 (H) 10/09/2019 11:23 AM   HGBA1C 6.6 (H) 04/10/2019 10:34 AM .  Pharmacist Clinical Goal(s): o Over the next 180 days, patient will work with PharmD and providers to maintain A1c goal <7% . Current regimen:  o No medications . Interventions: o Discussed blood sugar goals and benefits of diet and exercise for prevention of diabetes progression . Patient self care activities - Over the next 180 days, patient will: o Continue low sugar and low carb diet  Medication management . Pharmacist Clinical Goal(s): o Over the next 180 days, patient will work with PharmD and providers to maintain optimal medication adherence . Current pharmacy: [facility] mail order . Interventions o Comprehensive medication review performed. o Continue current medication management strategy . Patient self care  activities - Over the next 180 days, patient will: o Focus on medication adherence by fill date o Take medications as prescribed o Report any questions or concerns to PharmD and/or provider(s)  Please see past updates related to this goal by clicking on the "Past Updates" button in the selected goal        Diabetes   A1c goal < 7% Diet-controlled, no medications indicated.  Recent Relevant Labs: Lab Results  Component Value Date/Time   HGBA1C 6.2 (H) 10/09/2019 11:23 AM   HGBA1C 6.6 (H) 04/10/2019 10:34 AM   EGFR 78 (L) 01/30/2015 02:06 PM   MICROALBUR 1.0 08/14/2015 08:44 AM   MICROALBUR 1.3 05/10/2014 09:34 AM    Last diabetic Foot exam:  Lab Results  Component Value Date/Time   HMDIABEYEEXA No Retinopathy 09/15/2017 12:00 AM    Last diabetic Eye exam: No results found for: HMDIABFOOTEX   Checking BG: Weekly Recent FBG Readings: 87 - 120  We discussed: congratulated pt on A1c improvement from January to July;   Plan  Continue control with diet and exercise   Hypertension  BP goal < 140/90  Office blood pressures are  BP Readings from Last 3 Encounters:  10/09/19 138/84  04/10/19 134/72  02/09/19 (!) 130/58   Kidney Function Lab Results  Component Value Date/Time   CREATININE 0.64 10/09/2019 11:23 AM   CREATININE 0.63 04/10/2019 10:34 AM   CREATININE 0.68 10/05/2018 09:44 AM   CREATININE 0.8 01/30/2015 02:06 PM   CREATININE 0.8 01/29/2014 03:18 PM   GFR 91.61 04/10/2019 10:34 AM   GFRNONAA >60 02/18/2015 11:30 AM   GFRAA >60 02/18/2015 11:30 AM   K 4.2 10/09/2019 11:23 AM   K 3.9 04/10/2019 10:34 AM   K 3.7 01/30/2015 02:06 PM   K 3.9 01/29/2014 03:18 PM   Patient checks BP at home infrequently  Patient home BP readings are ranging: 120-130/60-70s  Patient has failed these meds in the past: n/a Patient is currently controlled on the following medications:   losartan 50 mg daily,  triamterene/HCTZ 75-50 mg 1/2 tab daily  We discussed diet  and exercise extensively, blood pressure goals, consequences of elevated BP, proper BP monitoring technique. Also discussed low blood pressure and monitoring for dizziness.  Plan  Continue current medications and control with diet and exercise   Hyperlipidemia   LDL goal < 100  Lipid Panel     Component Value Date/Time   CHOL 223 (H) 10/09/2019 1123   CHOL 193 05/10/2014 0934   TRIG 404 (H) 10/09/2019 1123   TRIG 251 (H) 05/10/2014 0934   HDL 57 10/09/2019 1123   HDL 63 05/10/2014 0934   CHOLHDL 3.9 10/09/2019 1123   VLDL 61.8 (H) 04/10/2019 1034   LDLCALC  10/09/2019 1123     Comment:     . LDL cholesterol not calculated. Triglyceride levels greater than 400 mg/dL invalidate calculated LDL results. . Reference range: <100 . Desirable range <100 mg/dL for primary prevention;   <70 mg/dL for patients with CHD or diabetic patients  with > or = 2 CHD risk factors. Marland Kitchen LDL-C is now calculated using the Martin-Hopkins  calculation, which is a validated novel method providing  better accuracy than the Friedewald equation in the  estimation of LDL-C.  Cresenciano Genre et al. Annamaria Helling. 1324;401(02): 2061-2068  (http://education.[employer].com/faq/FAQ164)    [place] 80 05/10/2014 0934   LDLDIRECT 103.0 04/10/2019 1034   The 10-year ASCVD risk score Mikey Bussing DC Jr., et al., 2013) is: 48.9%   Values used to calculate the score:     Age: 10 years     Sex: Female     Is Non-Hispanic African American: No     Diabetic: Yes     Tobacco smoker: No     Systolic Blood Pressure: 725 mmHg     Is BP treated: Yes     HDL Cholesterol: 57 mg/dL     Total Cholesterol: 223 mg/dL   Patient has failed these meds in past: n/a Patient is currently controlled on the following medications:   pravastatin 40 mg daily,   OTC fish oil 1000 mg daily  We discussed:  diet and exercise extensively; hard to walk due to knee. Eats a lot of veggies from her garden; yogurt every day. Pt is working to walk  more and improve diet to lower triglycerides.  Plan  Continue current medications and control with diet and exercise  Provide Low Triglyeride eating plan  Osteoarthritis   Patient has failed these meds in past: n/a Patient is currently controlled on the following medications:   meloxicam 7.5 mg PRN,  OTC Voltaren gel  We discussed: pt uses NSAID sparingly due to GI upset even with food. She uses Voltaren gel on her knee and on her hands at night, reports it helps a lot.  Plan  Continue current medications   Osteopenia / Osteoporosis   Last DEXA Scan: 09/12/2017  T-Score femoral neck: -0.8  T-Score total hip: +0.3  T-Score lumbar spine: n/a  T-Score forearm radius: -1.1  10-year probability of major osteoporotic fracture: 8.1%  10-year probability of hip fracture: 0.9%  VITD  Date Value Ref Range Status  08/07/2014 36.82 30.00 - 100.00 ng/mL Final    Patient is not a candidate for pharmacologic treatment  Patient has failed these meds in past: alendronate (2014-2016) Patient is currently controlled on the following medications:  . Calcium-Vitamin D daily  We discussed:  Recommend [phone] units of vitamin D daily. Recommend 1200 mg of calcium daily from dietary and supplemental sources.  Plan  Continue current medications  Medication Management   Pt uses [facility] mail order pharmacy for all medications Does not use pill box - separates by AM and PM  Pt endorses 100% compliance  We discussed:  Pt is managing meds well on her own, very rarely misses doses, re-orders meds online through mail order and denies issues. She currently gets all generics for $0.   Plan  Continue current med management strategy     Follow up: 6 month phone visit  Charlene Brooke, PharmD Clinical Pharmacist [facility] [phone]

## 2019-10-18 NOTE — Patient Instructions (Addendum)
Visit Information  Phone number for Pharmacist: [phone]  Goals Addressed            This Visit's Progress   . Pharmacy Care Plan       CARE PLAN ENTRY (see longitudinal plan of care for additional care plan information)  Current Barriers:  . Chronic Disease Management support, education, and care coordination needs related to Hypertension, Hyperlipidemia, and Diabetes   Hypertension BP Readings from Last 3 Encounters:  10/09/19 138/84  04/10/19 134/72  02/09/19 (!) 130/58 .  Pharmacist Clinical Goal(s): o Over the next 180 days, patient will work with PharmD and providers to maintain BP goal <140/90 . Current regimen:  o losartan 50 mg daily, o triamterene/HCTZ 75-50 mg 1/2 tab daily . Interventions: o Discussed BP goals and benefits of medication for prevention of heart attack / stroke . Patient self care activities - Over the next 180 days, patient will: o Check BP 1-2 times weekly, document, and provide at future appointments o Ensure daily salt intake < 2300 mg/day  Hyperlipidemia Lab Results  Component Value Date/Time   LDLDIRECT 103.0 04/10/2019 10:34 AM   TRIG 404 (H) 10/09/2019 11:23 AM   TRIG 251 (H) 05/10/2014 09:34 AM .  Pharmacist Clinical Goal(s): o Over the next 180 days, patient will work with PharmD and providers to achieve LDL goal < 100 and Trig < 200 . Current regimen:  o pravastatin 40 mg daily,  o OTC fish oil 1000 mg daily . Interventions: o Discussed cholesterol goals and benefits of medication for prevention of heart attack / stroke . Patient self care activities - Over the next 180 days, patient will: o Continue medications as directed o Reduce cholesterol in diet (see attached)  Diabetes Lab Results  Component Value Date/Time   HGBA1C 6.2 (H) 10/09/2019 11:23 AM   HGBA1C 6.6 (H) 04/10/2019 10:34 AM .  Pharmacist Clinical Goal(s): o Over the next 180 days, patient will work with PharmD and providers to maintain A1c goal  <7% . Current regimen:  o No medications . Interventions: o Discussed blood sugar goals and benefits of diet and exercise for prevention of diabetes progression . Patient self care activities - Over the next 180 days, patient will: o Continue low sugar and low carb diet  Medication management . Pharmacist Clinical Goal(s): o Over the next 180 days, patient will work with PharmD and providers to maintain optimal medication adherence . Current pharmacy: [facility] mail order . Interventions o Comprehensive medication review performed. o Continue current medication management strategy . Patient self care activities - Over the next 180 days, patient will: o Focus on medication adherence by fill date o Take medications as prescribed o Report any questions or concerns to PharmD and/or provider(s)  Please see past updates related to this goal by clicking on the "Past Updates" button in the selected goal       The patient verbalized understanding of instructions provided today and agreed to receive a mailed copy of patient instruction and/or educational materials.  Telephone follow up appointment with pharmacy team member scheduled for: 6 months  Charlene Brooke, PharmD Clinical Pharmacist [facility] [phone]  High Triglycerides Eating Plan Triglycerides are a type of fat in the blood. High levels of triglycerides can increase your risk of heart disease and stroke. If your triglyceride levels are high, choosing the right foods can help lower your triglycerides and keep your heart healthy. Work with your health care provider or a diet  and nutrition specialist (dietitian) to develop an eating plan that is right for you. What are tips for following this plan? General guidelines   Lose weight, if you are overweight. For most people, losing 5-10 lbs (2-5 kg) helps lower triglyceride levels. A weight-loss plan may include. ? 30 minutes of exercise at least 5  days a week. ? Reducing the amount of calories, sugar, and fat you eat.  Eat a wide variety of fresh fruits, vegetables, and whole grains. These foods are high in fiber.  Eat foods that contain healthy fats, such as fatty fish, nuts, seeds, and olive oil.  Avoid foods that are high in added sugar, added salt (sodium), saturated fat, and trans fat.  Avoid low-fiber, refined carbohydrates such as white bread, crackers, noodles, and white rice.  Avoid foods with partially hydrogenated oils (trans fats), such as fried foods or stick margarine.  Limit alcohol intake to no more than 1 drink a day for nonpregnant women and 2 drinks a day for men. One drink equals 12 oz of beer, 5 oz of wine, or 1 oz of hard liquor. Your health care provider may recommend that you drink less depending on your overall health. Reading food labels  Check food labels for the amount of saturated fat. Choose foods with no or very little saturated fat.  Check food labels for the amount of trans fat. Choose foods with no trans fat.  Check food labels for the amount of cholesterol. Choose foods low in cholesterol. Ask your dietitian how much cholesterol you should have each day.  Check food labels for the amount of sodium. Choose foods with less than 140 milligrams (mg) per serving. Shopping  Buy dairy products labeled as nonfat (skim) or low-fat (1%).  Avoid buying processed or prepackaged foods. These are often high in added sugar, sodium, and fat. Cooking  Choose healthy fats when cooking, such as olive oil or canola oil.  Cook foods using lower fat methods, such as baking, broiling, boiling, or grilling.  Make your own sauces, dressings, and marinades when possible, instead of buying them. Store-bought sauces, dressings, and marinades are often high in sodium and sugar. Meal planning  Eat more home-cooked food and less restaurant, buffet, and fast food.  Eat fatty fish at least 2 times each week. Examples  of fatty fish include salmon, trout, mackerel, tuna, and herring.  If you eat whole eggs, do not eat more than 3 egg yolks per week. What foods are recommended? The items listed may not be a complete list. Talk with your dietitian about what dietary choices are best for you. Grains Whole wheat or whole grain breads, crackers, cereals, and pasta. Unsweetened oatmeal. Bulgur. Barley. Quinoa. Brown rice. Whole wheat flour tortillas. Vegetables Fresh or frozen vegetables. Low-sodium canned vegetables. Fruits All fresh, canned (in natural juice), or frozen fruits. Meats and other protein foods Skinless chicken or [place]. Ground chicken or [place]. Lean cuts of pork, trimmed of fat. Fish and seafood, especially salmon, trout, and herring. Egg whites. Dried beans, peas, or lentils. Unsalted nuts or seeds. Unsalted canned beans. Natural peanut or almond butter. Dairy Low-fat dairy products. Skim or low-fat (1%) milk. Reduced fat (2%) and low-sodium cheese. Low-fat ricotta cheese. Low-fat cottage cheese. Plain, low-fat yogurt. Fats and oils Tub margarine without trans fats. Light or reduced-fat mayonnaise. Light or reduced-fat salad dressings. Avocado. Safflower, olive, sunflower, soybean, and canola oils. What foods are not recommended? The items listed may not be a complete list. Talk with  your dietitian about what dietary choices are best for you. Grains White bread. White (regular) pasta. White rice. Cornbread. Bagels. Pastries. Crackers that contain trans fat. Vegetables Creamed or fried vegetables. Vegetables in a cheese sauce. Fruits Sweetened dried fruit. Canned fruit in syrup. Fruit juice. Meats and other protein foods Fatty cuts of meat. Ribs. Chicken wings. Berniece Salines. Sausage. Bologna. Salami. Chitterlings. Fatback. Hot dogs. Bratwurst. Packaged lunch meats. Dairy Whole or reduced-fat (2%) milk. Half-and-half. Cream cheese. Full-fat or sweetened yogurt. Full-fat cheese. Nondairy creamers.  Whipped toppings. Processed cheese or cheese spreads. Cheese curds. Beverages Alcohol. Sweetened drinks, such as soda, lemonade, fruit drinks, or punches. Fats and oils Butter. Stick margarine. Lard. Shortening. Ghee. Bacon fat. Tropical oils, such as coconut, palm kernel, or palm oils. Sweets and desserts Corn syrup. Sugars. Honey. Molasses. Candy. Jam and jelly. Syrup. Sweetened cereals. Cookies. Pies. Cakes. Donuts. Muffins. Ice cream. Condiments Store-bought sauces, dressings, and marinades that are high in sugar, such as ketchup and barbecue sauce. Summary  High levels of triglycerides can increase the risk of heart disease and stroke. Choosing the right foods can help lower your triglycerides.  Eat plenty of fresh fruits, vegetables, and whole grains. Choose low-fat dairy and lean meats. Eat fatty fish at least twice a week.  Avoid processed and prepackaged foods with added sugar, sodium, saturated fat, and trans fat.  If you need suggestions or have questions about what types of food are good for you, talk with your health care provider or a dietitian. This information is not intended to replace advice given to you by your health care provider. Make sure you discuss any questions you have with your health care provider. Document Revised: 02/25/2017 Document Reviewed: 05/18/2016 Elsevier Patient Education  2020 [employer].

## 2019-10-19 ENCOUNTER — Ambulatory Visit
Admission: RE | Admit: 2019-10-19 | Discharge: 2019-10-19 | Disposition: A | Discharge status: Home / Self Care | Payer: Medicare Other | Source: Ambulatory Visit | Attending: Internal Medicine | Admitting: Internal Medicine

## 2019-10-19 DIAGNOSIS — E041 Nontoxic single thyroid nodule: Secondary | ICD-10-CM | POA: Diagnosis not present

## 2019-10-19 DIAGNOSIS — E042 Nontoxic multinodular goiter: Secondary | ICD-10-CM

## 2019-11-16 ENCOUNTER — Other Ambulatory Visit: Payer: Self-pay | Admitting: Internal Medicine

## 2019-11-23 ENCOUNTER — Ambulatory Visit
Admission: RE | Admit: 2019-11-23 | Discharge: 2019-11-23 | Disposition: A | Discharge status: Home / Self Care | Payer: Medicare Other | Source: Ambulatory Visit | Attending: Adult Health | Admitting: Adult Health

## 2019-11-23 ENCOUNTER — Other Ambulatory Visit: Payer: Self-pay

## 2019-11-23 ENCOUNTER — Telehealth: Payer: Self-pay | Admitting: *Deleted

## 2019-11-23 DIAGNOSIS — Z780 Asymptomatic menopausal state: Secondary | ICD-10-CM | POA: Diagnosis not present

## 2019-11-23 DIAGNOSIS — Z1231 Encounter for screening mammogram for malignant neoplasm of breast: Secondary | ICD-10-CM | POA: Diagnosis not present

## 2019-11-23 DIAGNOSIS — E2839 Other primary ovarian failure: Secondary | ICD-10-CM

## 2019-11-23 DIAGNOSIS — M85832 Other specified disorders of bone density and structure, left forearm: Secondary | ICD-10-CM | POA: Diagnosis not present

## 2019-11-23 NOTE — Telephone Encounter (Addendum)
-----   Message from Gardenia Phlegm, NP sent at 11/23/2019  2:17 PM EDT ----- Osteopenia.  Please notify patient.  Keep current treatment with calcium, vitmain d and weight bearing exercise.  ----- Message ----- From: Interface, Rad Results In Sent: 11/23/2019   1:51 PM EDT To: Gardenia Phlegm, NP  This RN called and spoke with pt- reviewed current intake of vitamin d and calcium at 1 tablet a day and current dedicated weight bearing exercise.  Per discussion- plan is for pt to increase current dose of supplement to bid and she will engage in dedicated weight bearing exercise.

## 2019-12-04 DIAGNOSIS — H401112 Primary open-angle glaucoma, right eye, moderate stage: Secondary | ICD-10-CM | POA: Diagnosis not present

## 2019-12-04 DIAGNOSIS — H2513 Age-related nuclear cataract, bilateral: Secondary | ICD-10-CM | POA: Diagnosis not present

## 2019-12-04 DIAGNOSIS — E119 Type 2 diabetes mellitus without complications: Secondary | ICD-10-CM | POA: Diagnosis not present

## 2019-12-04 DIAGNOSIS — H0102A Squamous blepharitis right eye, upper and lower eyelids: Secondary | ICD-10-CM | POA: Diagnosis not present

## 2019-12-04 DIAGNOSIS — H401121 Primary open-angle glaucoma, left eye, mild stage: Secondary | ICD-10-CM | POA: Diagnosis not present

## 2019-12-04 LAB — HM DIABETES EYE EXAM

## 2019-12-10 ENCOUNTER — Encounter: Payer: Self-pay | Admitting: Internal Medicine

## 2019-12-11 ENCOUNTER — Encounter: Payer: Self-pay | Admitting: Internal Medicine

## 2019-12-27 ENCOUNTER — Other Ambulatory Visit: Payer: Self-pay | Admitting: *Deleted

## 2019-12-27 MED ORDER — TRIAMTERENE-HCTZ 75-50 MG PO TABS: 0.5000 | ORAL_TABLET | Freq: Every day | ORAL | 1 refills | Status: DC

## 2020-03-03 ENCOUNTER — Other Ambulatory Visit: Payer: Self-pay

## 2020-03-03 ENCOUNTER — Inpatient Hospital Stay: Payer: Medicare Other | Attending: Adult Health | Admitting: Adult Health

## 2020-03-03 ENCOUNTER — Encounter: Payer: Self-pay | Admitting: Adult Health

## 2020-03-03 VITALS — BP 144/75 | HR 87 | Temp 98.1°F | Resp 18 | Ht 62.5 in | Wt 157.4 lb

## 2020-03-03 DIAGNOSIS — Z79899 Other long term (current) drug therapy: Secondary | ICD-10-CM | POA: Insufficient documentation

## 2020-03-03 DIAGNOSIS — E785 Hyperlipidemia, unspecified: Secondary | ICD-10-CM | POA: Diagnosis not present

## 2020-03-03 DIAGNOSIS — I10 Essential (primary) hypertension: Secondary | ICD-10-CM | POA: Diagnosis not present

## 2020-03-03 DIAGNOSIS — Z923 Personal history of irradiation: Secondary | ICD-10-CM | POA: Diagnosis not present

## 2020-03-03 DIAGNOSIS — E119 Type 2 diabetes mellitus without complications: Secondary | ICD-10-CM | POA: Insufficient documentation

## 2020-03-03 DIAGNOSIS — Z853 Personal history of malignant neoplasm of breast: Secondary | ICD-10-CM | POA: Diagnosis not present

## 2020-03-03 DIAGNOSIS — R011 Cardiac murmur, unspecified: Secondary | ICD-10-CM | POA: Insufficient documentation

## 2020-03-03 DIAGNOSIS — M810 Age-related osteoporosis without current pathological fracture: Secondary | ICD-10-CM | POA: Diagnosis not present

## 2020-03-03 NOTE — Progress Notes (Signed)
Heard from pt this pm & she states she doesn't need referral to SW d/t stress.  She states she just has to be in too many places at the same time & that she will handle it.

## 2020-03-03 NOTE — Patient Instructions (Signed)

## 2020-03-03 NOTE — Progress Notes (Signed)
CLINIC:  Survivorship   REASON FOR VISIT:  Routine follow-up for history of breast cancer.   BRIEF ONCOLOGIC HISTORY:   Per Dr. Geralyn Flash note from 01/30/2015: #1 T1c N0 breast cancer status post lumpectomy 05/07/2002. She went on to complete radiation therapy adjuvantly on 08/19/2012. #2 patient took Arimidex 1 mg daily from June 2004 to June 2014.  INTERVAL HISTORY:  Ms. Amero presents to the Survivorship Clinic today for routine follow-up for her history of breast cancer.  Her most recent mammogram was completed on 11/23/2019 and showed breast density category C. And no evidence of malignancy.  She also underwent a bone density test at that time which showed osteopenia with a T score of -1.4 in her left forearm.  This is slightly worse in her forearm from 2019, however the femurs were stable. She is taking calcium BID and tolerates this well.    She notes that she is up to date with PCP visits.  She no longer needs GYN or colon cancer screenings.  She sees her dermatologist once a year.  She notes she is very independent in her activities at home, and she could exercise a little bit more this year.  She has no health changes, and no concerns today.   REVIEW OF SYSTEMS:  Review of Systems  Constitutional: Negative for appetite change, chills, fatigue, fever and unexpected weight change.  HENT:   Negative for hearing loss, lump/mass and trouble swallowing.   Eyes: Negative for eye problems and icterus.  Respiratory: Negative for chest tightness, cough and shortness of breath.   Cardiovascular: Negative for chest pain, leg swelling and palpitations.  Gastrointestinal: Negative for abdominal distention, abdominal pain, constipation and diarrhea.  Endocrine: Negative for hot flashes.  Musculoskeletal: Negative for arthralgias.  Skin: Negative for itching and rash.  Neurological: Negative for dizziness, extremity weakness, headaches and numbness.  Hematological: Negative for adenopathy.  Does not bruise/bleed easily.  Psychiatric/Behavioral: Negative for depression. The patient is not nervous/anxious.   Breast: Denies any new nodularity, masses, tenderness, nipple changes, or nipple discharge.       PAST MEDICAL/SURGICAL HISTORY:  Past Medical History:  Diagnosis Date  . Breast cancer ([place]) left-2004 sx  . Diabetes mellitus    TYPE 2  . Diverticulitis    09/14/12  . Diverticulosis   . Heart murmur   . History of radiation therapy   . History of urinary tract infection   . Hyperlipidemia   . Hypertension   . Osteoporosis    Fosamax Rxed 2/13  . Personal history of colonic adenomas 11/15/2012  . Personal history of radiation therapy 06/2002  . Pneumonia    2013  . Seasonal allergies   . Tingling    right leg    Past Surgical History:  Procedure Laterality Date  . ABDOMINAL HYSTERECTOMY  1965   ovaries remain; for dysfunctional menses; ? endometriosis  . ABDOMINAL SURGERY    . BREAST BIOPSY Left 04/16/2002   malignant  . BREAST LUMPECTOMY Left   . colon polyps  2014   Dr Carlean Purl  . COLONOSCOPY  2004    Tics  . DILATION AND CURETTAGE OF UTERUS    . LAPAROSCOPIC SIGMOID COLECTOMY  04/13/2013   DR TOTH  . LAPAROSCOPIC SIGMOID COLECTOMY N/A 04/13/2013   Procedure: LAPAROSCOPIC ASSISTED SIGMOID COLECTOMY;  Surgeon: Merrie Roof, MD;  Location: [facility];  Service: General;  Laterality: N/A;  . LUMBAR LAMINECTOMY/DECOMPRESSION MICRODISCECTOMY N/A 02/19/2015   Procedure: MICRODISCECTOMY AT L4-L5, CENTRAL DECOMPRESSION  L4-L5 FOR SPINAL STENOSIS, FORAMINOTOMY FOR L4 L5 ROOT ON RIGHT;  Surgeon: Latanya Maudlin, MD;  Location: [facility];  Service: Orthopedics;  Laterality: N/A;  . removal bone spur Left 2/14   2nd finger  . TONSILLECTOMY AND ADENOIDECTOMY       ALLERGIES:  Allergies  Allergen Reactions  . Celestone [Betamethasone] Nausea Only    Sweated; face and chest with redness for several days      CURRENT MEDICATIONS:  Outpatient Encounter  Medications as of 03/03/2020  Medication Sig Note  . Ascorbic Acid (VITAMIN C) 1000 MG tablet Take 500 mg by mouth daily.   . Biotin w/ Vitamins C & E (HAIR SKIN & NAILS GUMMIES PO) Take 1 each by mouth daily.   . Calcium Carbonate-Vitamin D (CALTRATE 600+D PO) Take 1 capsule by mouth in the morning and at bedtime.    . cycloSPORINE (RESTASIS) 0.05 % ophthalmic emulsion Place 1 drop into both eyes 2 (two) times daily.    . Ibuprofen-diphenhydrAMINE HCl (ADVIL PM) 200-25 MG CAPS Take 1 capsule by mouth at bedtime as needed.   . Lancets (ONETOUCH ULTRASOFT) lancets USE AS INSTRUCTED TO CHECK  BLOOD SUGARS DAILY   . losartan (COZAAR) 50 MG tablet Take 1 tablet (50 mg total) by mouth daily.   . Melatonin 5 MG TABS Take 5 mg by mouth daily.   . meloxicam (MOBIC) 7.5 MG tablet TAKE 1 TABLET BY MOUTH  DAILY   . metroNIDAZOLE (METROGEL) 0.75 % gel    . Multiple Vitamins-Minerals (PRESERVISION AREDS PO) Take by mouth.   . Omega-3 Fatty Acids (FISH OIL) 1000 MG CAPS Take 1 capsule by mouth daily.   . ONE TOUCH ULTRA TEST test strip USE TO CHECK BLOOD SUGAR  DAILY   . Polyethyl Glycol-Propyl Glycol (SYSTANE) 0.4-0.3 % SOLN Place 1 drop into both eyes every morning. 02/02/2018: Pt uses 4-5 times daily  . pravastatin (PRAVACHOL) 40 MG tablet TAKE 1 TABLET BY MOUTH  DAILY   . triamterene-hydrochlorothiazide (MAXZIDE) 75-50 MG tablet Take 0.5 tablets by mouth daily.   Marland Kitchen bismuth subsalicylate (PEPTO BISMOL) 262 MG chewable tablet Chew 262 mg by mouth as needed for diarrhea or loose stools. (Patient not taking: Reported on 03/03/2020)    No facility-administered encounter medications on file as of 03/03/2020.     ONCOLOGIC FAMILY HISTORY:  Family History  Problem Relation Age of Onset  . Stroke Mother 50  . Hypertension Mother   . Lung cancer Father        smoker  . Cancer - Lung Father   . Hypertension Brother   . Heart disease Brother 28  . Stroke Brother 18  . Hypertension Brother   . Hypertension  Sister   . Diabetes Neg Hx   . Colon cancer Neg Hx   . Esophageal cancer Neg Hx   . Rectal cancer Neg Hx   . Stomach cancer Neg Hx   . Colon polyps Neg Hx     GENETIC COUNSELING/TESTING: Not at this time  SOCIAL HISTORY:  Social History   Socioeconomic History  . Marital status: Widowed    Spouse name: Not on file  . Number of children: Not on file  . Years of education: Not on file  . Highest education level: Not on file  Occupational History  . Not on file  Tobacco Use  . Smoking status: Never Smoker  . Smokeless tobacco: Never Used  Vaping Use  . Vaping Use: Never used  Substance and Sexual  Activity  . Alcohol use: No    Alcohol/week: 0.0 standard drinks  . Drug use: No  . Sexual activity: Not on file  Other Topics Concern  . Not on file  Social History Narrative   Exercise: no   Social Determinants of Health   Financial Resource Strain: Low Risk   . Difficulty of Paying Living Expenses: Not hard at all  Food Insecurity:   . Worried About Charity fundraiser in the Last Year: Not on file  . Ran Out of Food in the Last Year: Not on file  Transportation Needs:   . Lack of Transportation (Medical): Not on file  . Lack of Transportation (Non-Medical): Not on file  Physical Activity:   . Days of Exercise per Week: Not on file  . Minutes of Exercise per Session: Not on file  Stress:   . Feeling of Stress : Not on file  Social Connections:   . Frequency of Communication with Friends and Family: Not on file  . Frequency of Social Gatherings with Friends and Family: Not on file  . Attends Religious Services: Not on file  . Active Member of Clubs or Organizations: Not on file  . Attends Archivist Meetings: Not on file  . Marital Status: Not on file  Intimate Partner Violence:   . Fear of Current or Ex-Partner: Not on file  . Emotionally Abused: Not on file  . Physically Abused: Not on file  . Sexually Abused: Not on file      PHYSICAL  EXAMINATION:  Vital Signs: Vitals:   03/03/20 1138  BP: (!) 144/75  Pulse: 87  Resp: 18  Temp: 98.1 F (36.7 C)  SpO2: 96%   Filed Weights   03/03/20 1138  Weight: 157 lb 6.4 oz (71.4 kg)   General: Well-nourished, well-appearing female in no acute distress.  Unaccompanied today.   HEENT: Head is normocephalic.  Pupils equal and reactive to light. Conjunctivae clear without exudate.  Sclerae anicteric. Mask in place  Lymph: No cervical, supraclavicular, or infraclavicular lymphadenopathy noted on palpation.  Cardiovascular: Regular rate and rhythm.Marland Kitchen Respiratory: Clear to auscultation bilaterally. Chest expansion symmetric; breathing non-labored.  Breast Exam:  -Left breast: No appreciable masses on palpation. No skin redness, thickening, or peau d'orange appearance; no nipple retraction or nipple discharge; mild distortion in symmetry at previous lumpectomy site well healed scar without erythema or nodularity.  -Right breast: No appreciable masses on palpation. No skin redness, thickening, or peau d'orange appearance; no nipple retraction or nipple discharge; -Axilla: No axillary adenopathy bilaterally.  GI: Abdomen soft and round; non-tender, non-distended. Bowel sounds normoactive. No hepatosplenomegaly.   GU: Deferred.  Neuro: No focal deficits. Steady gait.  Psych: Mood and affect normal and appropriate for situation.  MSK: No focal spinal tenderness to palpation, full range of motion in bilateral upper extremities Extremities: No edema. Skin: Warm and dry.  LABORATORY DATA:  None for this visit   DIAGNOSTIC IMAGING:  Most recent mammogram:  Narrative & Impression  CLINICAL DATA:  Screening.  EXAM: DIGITAL SCREENING BILATERAL MAMMOGRAM WITH TOMO AND CAD  COMPARISON:  Previous exam(s).  ACR Breast Density Category c: The breast tissue is heterogeneously dense, which may obscure small masses.  FINDINGS: There are no findings suspicious for malignancy. Images  were processed with CAD.  IMPRESSION: No mammographic evidence of malignancy. A result letter of this screening mammogram will be mailed directly to the patient.  RECOMMENDATION: Screening mammogram in one year. (Code:SM-B-01Y)  BI-RADS CATEGORY  1: Negative.   Electronically Signed   By: Kristopher Oppenheim M.D.   On: 11/26/2019 16:27      ASSESSMENT AND PLAN:  Ms.. Heeren is a pleasant 79 y.o. 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  She presents to the [facility] for surveillance and routine follow-up.   1. History of breast cancer:  Ms. Keniston is currently clinically and radiographically without evidence of disease or recurrence of breast cancer. She will be due for mammogram in 10/2020.  She will return in one year for LTS follow up.  I encouraged her to call me with any questions or concerns before her next visit at the cancer center, and I would be happy to see her sooner, if needed.     2. Bone health:  Given Ms. Fitch's age, history of breast cancer, and her previous anti-estrogen therapy with Anastrozole, she is at risk for bone demineralization. Her bone density in 10/2019 showed mild osteopenia with a t score of -1.4.  She was given education on specific food and activities to promote bone health.  3. Cancer screening:  Due to Ms. Dishman's history and her age, she should receive screening for skin cancers. She was encouraged to follow-up with her PCP for appropriate cancer screenings.   4. Health maintenance and wellness promotion: Ms. Schmelter was encouraged to consume 5-7 servings of fruits and vegetables per day. She was also encouraged to engage in moderate to vigorous exercise for 30 minutes per day most days of the week. She was instructed to limit her alcohol  consumption and continue to abstain from tobacco use.     Dispo:  -Return to cancer center in one year -Bone density due 10/2020 -Mammogram 10/2020  Total encounter time: 20 minutes*  Wilber Bihari, NP 03/03/20 12:10 PM Medical Oncology and Hematology [facility] [place], [place] [address] Tel. [phone]    Fax. [phone]  *Total Encounter Time as defined by the Centers for Medicare and Medicaid Services includes, in addition to the face-to-face time of a patient visit (documented in the note above) non-face-to-face time: obtaining and reviewing outside history, ordering and reviewing medications, tests or procedures, care coordination (communications with other health care professionals or caregivers) and documentation in the medical record.     Note: PRIMARY CARE PROVIDER Binnie Rail, [place] [phone]

## 2020-03-16 ENCOUNTER — Other Ambulatory Visit: Payer: Self-pay | Admitting: Internal Medicine

## 2020-04-02 DIAGNOSIS — H401112 Primary open-angle glaucoma, right eye, moderate stage: Secondary | ICD-10-CM | POA: Diagnosis not present

## 2020-04-02 DIAGNOSIS — H0102B Squamous blepharitis left eye, upper and lower eyelids: Secondary | ICD-10-CM | POA: Diagnosis not present

## 2020-04-02 DIAGNOSIS — H0102A Squamous blepharitis right eye, upper and lower eyelids: Secondary | ICD-10-CM | POA: Diagnosis not present

## 2020-04-02 DIAGNOSIS — H401121 Primary open-angle glaucoma, left eye, mild stage: Secondary | ICD-10-CM | POA: Diagnosis not present

## 2020-04-02 DIAGNOSIS — H2513 Age-related nuclear cataract, bilateral: Secondary | ICD-10-CM | POA: Diagnosis not present

## 2020-04-02 DIAGNOSIS — Z9889 Other specified postprocedural states: Secondary | ICD-10-CM | POA: Diagnosis not present

## 2020-04-02 DIAGNOSIS — E119 Type 2 diabetes mellitus without complications: Secondary | ICD-10-CM | POA: Diagnosis not present

## 2020-04-09 NOTE — Progress Notes (Unsigned)
Subjective:    Patient ID: Tiffany Hood, female    DOB: [birth year], 80 y.o.   MRN: [id number]  HPI The patient is here for follow up of their chronic medical problems, including htn, DM, hyperlipidemia, OA  She is not exercising regularly.   Her arthritis bothers her and it is cold out.   Overall doing well.   Medications and allergies reviewed with patient and updated if appropriate.  Patient Active Problem List   Diagnosis Date Noted  . Multiple thyroid nodules 08/12/2016  . Diabetes ([place]) 08/08/2015  . Spinal stenosis, lumbar region, with neurogenic claudication 02/19/2015  . Osteopenia 03/30/2013  . Diverticulitis of sigmoid colon s/p sigmoidectomy 10/05/2012  . Diverticulosis 10/19/2011  . Osteoarthritis 01/28/2010  . Hyperlipidemia 12/25/2007  . Essential hypertension 02/10/2007  . History of cancer of left breast 05/02/2002    Current Outpatient Medications on File Prior to Visit  Medication Sig Dispense Refill  . Ascorbic Acid (VITAMIN C) 1000 MG tablet Take 500 mg by mouth daily.    . Biotin w/ Vitamins C & E (HAIR SKIN & NAILS GUMMIES PO) Take 1 each by mouth daily.    . Calcium Carbonate-Vitamin D (CALTRATE 600+D PO) Take 1 capsule by mouth in the morning and at bedtime.    . cycloSPORINE (RESTASIS) 0.05 % ophthalmic emulsion Place 1 drop into both eyes 2 (two) times daily.     . diclofenac Sodium (VOLTAREN) 1 % GEL Apply 2 g topically 4 (four) times daily as needed.    . Ibuprofen-diphenhydrAMINE HCl (ADVIL PM) 200-25 MG CAPS Take 1 capsule by mouth at bedtime as needed.    . Lancets (ONETOUCH ULTRASOFT) lancets USE AS INSTRUCTED TO CHECK  BLOOD SUGARS DAILY 100 each 3  . losartan (COZAAR) 50 MG tablet TAKE 1 TABLET BY MOUTH  DAILY 90 tablet 3  . Melatonin 5 MG TABS Take 5 mg by mouth daily.    . meloxicam (MOBIC) 7.5 MG tablet TAKE 1 TABLET BY MOUTH  DAILY 90 tablet 3  . metroNIDAZOLE (METROGEL) 0.75 % gel     . Multiple Vitamins-Minerals (PRESERVISION  AREDS PO) Take by mouth.    . Omega-3 Fatty Acids (FISH OIL) 1000 MG CAPS Take 1 capsule by mouth daily.    . ONE TOUCH ULTRA TEST test strip USE TO CHECK BLOOD SUGAR  DAILY 100 each 3  . Polyethyl Glycol-Propyl Glycol 0.4-0.3 % SOLN Place 1 drop into both eyes every morning.    . pravastatin (PRAVACHOL) 40 MG tablet TAKE 1 TABLET BY MOUTH  DAILY 90 tablet 3  . triamterene-hydrochlorothiazide (MAXZIDE) 75-50 MG tablet Take 0.5 tablets by mouth daily. 45 tablet 1   No current facility-administered medications on file prior to visit.    Past Medical History:  Diagnosis Date  . Breast cancer ([place]) left-2004 sx  . Diabetes mellitus    TYPE 2  . Diverticulitis    09/14/12  . Diverticulosis   . Heart murmur   . History of radiation therapy   . History of urinary tract infection   . Hyperlipidemia   . Hypertension   . Osteoporosis    Fosamax Rxed 2/13  . Personal history of colonic adenomas 11/15/2012  . Personal history of radiation therapy 06/2002  . Pneumonia    2013  . Seasonal allergies   . Tingling    right leg     Past Surgical History:  Procedure Laterality Date  . ABDOMINAL HYSTERECTOMY  1965   ovaries remain;  for dysfunctional menses; ? endometriosis  . ABDOMINAL SURGERY    . BREAST BIOPSY Left 04/16/2002   malignant  . BREAST LUMPECTOMY Left   . colon polyps  2014   Dr Carlean Purl  . COLONOSCOPY  2004    Tics  . DILATION AND CURETTAGE OF UTERUS    . LAPAROSCOPIC SIGMOID COLECTOMY  04/13/2013   DR TOTH  . LAPAROSCOPIC SIGMOID COLECTOMY N/A 04/13/2013   Procedure: LAPAROSCOPIC ASSISTED SIGMOID COLECTOMY;  Surgeon: Merrie Roof, MD;  Location: [facility];  Service: General;  Laterality: N/A;  . LUMBAR LAMINECTOMY/DECOMPRESSION MICRODISCECTOMY N/A 02/19/2015   Procedure: MICRODISCECTOMY AT L4-L5, CENTRAL DECOMPRESSION L4-L5 FOR SPINAL STENOSIS, FORAMINOTOMY FOR L4 L5 ROOT ON RIGHT;  Surgeon: Latanya Maudlin, MD;  Location: [facility];  Service: Orthopedics;  Laterality: N/A;  .  removal bone spur Left 2/14   2nd finger  . TONSILLECTOMY AND ADENOIDECTOMY      Social History   Socioeconomic History  . Marital status: Widowed    Spouse name: Not on file  . Number of children: Not on file  . Years of education: Not on file  . Highest education level: Not on file  Occupational History  . Not on file  Tobacco Use  . Smoking status: Never Smoker  . Smokeless tobacco: Never Used  Vaping Use  . Vaping Use: Never used  Substance and Sexual Activity  . Alcohol use: No    Alcohol/week: 0.0 standard drinks  . Drug use: No  . Sexual activity: Not on file  Other Topics Concern  . Not on file  Social History Narrative   Exercise: no   Social Determinants of Health   Financial Resource Strain: Low Risk   . Difficulty of Paying Living Expenses: Not hard at all  Food Insecurity: Not on file  Transportation Needs: Not on file  Physical Activity: Not on file  Stress: Not on file  Social Connections: Not on file    Family History  Problem Relation Age of Onset  . Stroke Mother 13  . Hypertension Mother   . Lung cancer Father        smoker  . Cancer - Lung Father   . Hypertension Brother   . Heart disease Brother 1  . Stroke Brother 67  . Hypertension Brother   . Hypertension Sister   . Diabetes Neg Hx   . Colon cancer Neg Hx   . Esophageal cancer Neg Hx   . Rectal cancer Neg Hx   . Stomach cancer Neg Hx   . Colon polyps Neg Hx     Review of Systems  Constitutional: Negative for chills and fever.  Respiratory: Negative for cough, shortness of breath and wheezing.   Cardiovascular: Negative for chest pain, palpitations and leg swelling.  Gastrointestinal: Negative for abdominal pain and nausea.       No gerd  Neurological: Positive for headaches (rare). Negative for light-headedness.       Objective:   Vitals:   04/10/20 0953  BP: (!) 148/82  Pulse: 96  Temp: 98.1 F (36.7 C)  SpO2: 98%   BP Readings from Last 3 Encounters:   04/10/20 (!) 148/82  03/03/20 (!) 144/75  10/09/19 138/84   Wt Readings from Last 3 Encounters:  04/10/20 159 lb (72.1 kg)  03/03/20 157 lb 6.4 oz (71.4 kg)  10/09/19 160 lb (72.6 kg)   Body mass index is 28.62 kg/m.   Physical Exam    Constitutional: Appears well-developed and well-nourished. No  distress.  HENT:  Head: Normocephalic and atraumatic.  Neck: Neck supple. No tracheal deviation present. No thyromegaly present.  No cervical lymphadenopathy Cardiovascular: Normal rate, regular rhythm and normal heart sounds.   No murmur heard. No carotid bruit .  No edema Pulmonary/Chest: Effort normal and breath sounds normal. No respiratory distress. No has no wheezes. No rales.  Skin: Skin is warm and dry. Not diaphoretic.  Psychiatric: Normal mood and affect. Behavior is normal.      Assessment & Plan:    See Problem List for Assessment and Plan of chronic medical problems.    This visit occurred during the SARS-CoV-2 public health emergency.  Safety protocols were in place, including screening questions prior to the visit, additional usage of staff PPE, and extensive cleaning of exam room while observing appropriate contact time as indicated for disinfecting solutions.

## 2020-04-09 NOTE — Patient Instructions (Addendum)
  Blood work was ordered.     No immunization administered today.    Medications changes include :   none     Please followup in 6 months

## 2020-04-10 ENCOUNTER — Other Ambulatory Visit: Payer: Self-pay

## 2020-04-10 ENCOUNTER — Encounter: Payer: Self-pay | Admitting: Internal Medicine

## 2020-04-10 ENCOUNTER — Ambulatory Visit (INDEPENDENT_AMBULATORY_CARE_PROVIDER_SITE_OTHER): Payer: Medicare Other | Admitting: Internal Medicine

## 2020-04-10 VITALS — BP 148/82 | HR 96 | Temp 98.1°F | Ht 62.5 in | Wt 159.0 lb

## 2020-04-10 DIAGNOSIS — Z1159 Encounter for screening for other viral diseases: Secondary | ICD-10-CM

## 2020-04-10 DIAGNOSIS — M1711 Unilateral primary osteoarthritis, right knee: Secondary | ICD-10-CM | POA: Diagnosis not present

## 2020-04-10 DIAGNOSIS — E7849 Other hyperlipidemia: Secondary | ICD-10-CM | POA: Diagnosis not present

## 2020-04-10 DIAGNOSIS — E119 Type 2 diabetes mellitus without complications: Secondary | ICD-10-CM | POA: Diagnosis not present

## 2020-04-10 DIAGNOSIS — I10 Essential (primary) hypertension: Secondary | ICD-10-CM

## 2020-04-10 LAB — COMPREHENSIVE METABOLIC PANEL
ALT: 16 U/L (ref 0–35)
AST: 16 U/L (ref 0–37)
Albumin: 4.4 g/dL (ref 3.5–5.2)
Alkaline Phosphatase: 63 U/L (ref 39–117)
BUN: 25 mg/dL — ABNORMAL HIGH (ref 6–23)
CO2: 29 mEq/L (ref 19–32)
Calcium: 10 mg/dL (ref 8.4–10.5)
Chloride: 99 mEq/L (ref 96–112)
Creatinine, Ser: 0.63 mg/dL (ref 0.40–1.20)
GFR: 85.16 mL/min (ref >60.00)
Glucose, Bld: 105 mg/dL — ABNORMAL HIGH (ref 70–99)
Potassium: 3.8 mEq/L (ref 3.5–5.1)
Sodium: 137 mEq/L (ref 135–145)
Total Bilirubin: 0.4 mg/dL (ref 0.2–1.2)
Total Protein: 7.4 g/dL (ref 6.0–8.3)

## 2020-04-10 LAB — HEMOGLOBIN A1C: Hgb A1c MFr Bld: 6.6 % — ABNORMAL HIGH (ref 4.6–6.5)

## 2020-04-10 LAB — LIPID PANEL
Cholesterol: 222 mg/dL — ABNORMAL HIGH (ref 0–200)
HDL: 63 mg/dL (ref >39.00)
Total CHOL/HDL Ratio: 4
Triglycerides: 418 mg/dL — ABNORMAL HIGH (ref 0.0–149.0)

## 2020-04-10 LAB — LDL CHOLESTEROL, DIRECT: Direct LDL: 100 mg/dL

## 2020-04-10 MED ORDER — LOSARTAN POTASSIUM 50 MG PO TABS: 50.0000 mg | ORAL_TABLET | Freq: Every day | ORAL | 3 refills | Status: DC

## 2020-04-10 NOTE — Assessment & Plan Note (Signed)
Chronic Taking meloxicam daily Continue the voltaren gel as needed Take tylenol as needed Discussed risks of meloxicam - take with food

## 2020-04-10 NOTE — Assessment & Plan Note (Addendum)
Chronic BP well controlled at home, slightly high here today Monitor  Continue maxzide 75-50 mg 1/2 tab daily cmp

## 2020-04-10 NOTE — Assessment & Plan Note (Signed)
Chronic Check lipid panel  Continue pravastatin 40 mg daily Regular exercise and healthy diet encouraged  

## 2020-04-10 NOTE — Assessment & Plan Note (Signed)
Chronic Diet controlled Check a1c Low sugar / carb diet Stressed regular exercise

## 2020-04-11 LAB — HEPATITIS C ANTIBODY
Hepatitis C Ab: NONREACTIVE
SIGNAL TO CUT-OFF: 0.01 (ref <1.00)

## 2020-04-12 ENCOUNTER — Encounter: Payer: Self-pay | Admitting: Internal Medicine

## 2020-04-14 MED ORDER — ICOSAPENT ETHYL 1 G PO CAPS: 2.0000 g | ORAL_CAPSULE | Freq: Two times a day (BID) | ORAL | 1 refills | Status: DC

## 2020-04-15 MED ORDER — ICOSAPENT ETHYL 1 G PO CAPS: 2.0000 g | ORAL_CAPSULE | Freq: Two times a day (BID) | ORAL | 5 refills | Status: DC

## 2020-04-15 NOTE — Addendum Note (Signed)
Addended by: Binnie Rail on: 04/15/2020 12:37 PM   Modules accepted: Orders

## 2020-04-22 ENCOUNTER — Ambulatory Visit: Payer: Medicare Other | Admitting: Pharmacist

## 2020-04-22 ENCOUNTER — Other Ambulatory Visit: Payer: Self-pay

## 2020-04-22 DIAGNOSIS — E7849 Other hyperlipidemia: Secondary | ICD-10-CM

## 2020-04-22 DIAGNOSIS — I10 Essential (primary) hypertension: Secondary | ICD-10-CM

## 2020-04-22 DIAGNOSIS — E119 Type 2 diabetes mellitus without complications: Secondary | ICD-10-CM

## 2020-04-22 NOTE — Patient Instructions (Signed)
Visit Information  Phone number for Pharmacist: [phone]  Goals Addressed            This Visit's Progress   . Pharmacy Care Plan   On track    CARE PLAN ENTRY (see longitudinal plan of care for additional care plan information)  Current Barriers:  . Chronic Disease Management support, education, and care coordination needs related to Hypertension, Hyperlipidemia, and Diabetes   Hypertension BP Readings from Last 3 Encounters:  04/10/20 (!) 148/82  03/03/20 (!) 144/75  10/09/19 138/84 .  Pharmacist Clinical Goal(s): o Over the next 180 days, patient will work with PharmD and providers to maintain BP goal <140/90 . Current regimen:  o losartan 50 mg daily, o triamterene/HCTZ 75-50 mg 1/2 tab daily . Interventions: o Discussed BP goals and benefits of medication for prevention of heart attack / stroke . Patient self care activities - Over the next 180 days, patient will: o Check BP 1-2 times weekly, document, and provide at future appointments o Ensure daily salt intake < 2300 mg/day  Hyperlipidemia Lab Results  Component Value Date/Time   LDLDIRECT 100.0 04/10/2020 10:33 AM   TRIG (H) 04/10/2020 10:33 AM    418.0 Triglyceride is over 400; calculations on Lipids are invalid.   TRIG 251 (H) 05/10/2014 09:34 AM .  Pharmacist Clinical Goal(s): o Over the next 180 days, patient will work with PharmD and providers to achieve LDL goal < 100 and Trig < 200 . Current regimen:  o pravastatin 40 mg daily,  o Vascepa 1 g - 2 capsules BID . Interventions: o Discussed cholesterol goals and benefits of medication for prevention of heart attack / stroke o Pursue tier exception for Vascepa (currently Tier 4) . Patient self care activities - Over the next 180 days, patient will: o Continue medications as directed o Reduce cholesterol in diet (see attached) o Increase exercise  Diabetes Lab Results  Component Value Date/Time   HGBA1C 6.6 (H) 04/10/2020 10:33 AM   HGBA1C 6.2  (H) 10/09/2019 11:23 AM .  Pharmacist Clinical Goal(s): o Over the next 180 days, patient will work with PharmD and providers to maintain A1c goal <7% . Current regimen:  o No medications . Interventions: o Discussed blood sugar goals and benefits of diet and exercise for prevention of diabetes progression . Patient self care activities - Over the next 180 days, patient will: o Continue low sugar and low carb diet o Increase exercise  Medication management . Pharmacist Clinical Goal(s): o Over the next 180 days, patient will work with PharmD and providers to maintain optimal medication adherence . Current pharmacy: [facility] order . Interventions o Comprehensive medication review performed. o Continue current medication management strategy . Patient self care activities - Over the next 180 days, patient will: o Focus on medication adherence by fill date o Take medications as prescribed o Report any questions or concerns to PharmD and/or provider(s)  Please see past updates related to this goal by clicking on the "Past Updates" button in the selected goal       There are no care plans to display for this patient.  The patient verbalized understanding of instructions, educational materials, and care plan provided today and declined offer to receive copy of patient instructions, educational materials, and care plan.  Telephone follow up appointment with pharmacy team member scheduled for: 6 months  Charlene Brooke, PharmD, [facility] Clinical Pharmacist [facility] at [facility] [phone]

## 2020-04-22 NOTE — Chronic Care Management (AMB) (Signed)
Chronic Care Management Pharmacy  Name: Tiffany Hood  MRN: [id number] DOB: 1940-04-19   Chief Complaint/ HPI  Tiffany Hood,  80 y.o. , female presents for their Follow-Up CCM visit with the clinical pharmacist via telephone due to COVID-19 Pandemic.  PCP : Binnie Rail, MD  Patient Care Team: Binnie Rail, MD as PCP - General (Internal Medicine) Charlton Haws, [facility] as Pharmacist (Pharmacist) Delice Bison Charlestine Massed, NP as Nurse Practitioner (Hematology and Oncology)  Their chronic conditions include:Hypertension, Hyperlipidemia, Diabetes and Osteoarthritis, osteopenia, hx breast cancer 06/01/02), hx diverticulitis (removal of sigmoid colon)  Subjective: Pt moved to [place] in the 1960s, from [place]. Husband died 05-31-2001 of DM, son [place] She enjoys gardening, stays active in warmer months. Due to short gut issues, can't go on long trips.   Office Visits: 04/10/20 Dr Quay Burow OV: chronic f/u. TRIG too high, started Vascepa.  10/09/19 Dr Quay Burow OV:  No med changes. Cholesterol/Trig worsened since Jan.   04/10/19 Dr Quay Burow OV: chronic knee pain, has seen ortho, injections did not help. Discussed Voltaren gel, turmeric, NSAIDs, Tylenol. Triglycerides elevated, counseled diet changes and may consider med in future.  10/05/18 Dr Quay Burow OV: conditions stable, no med changes. US thyroid shows slight enlargement of thyroid w/ nodules, continue to monitor.   Consult Visit: 02/09/19 NP Causey (heme/onc): completed anastrozole x 10 yrs in 2012-05-31. no evidence malignancy on recent mammogram.   09/08/18 Dr Guinevere Scarlet (ortho): via claims. Knee pain.  08/16/18 Dr Ronnald Ramp (dermatology): via claims data.    Allergies  Allergen Reactions  . Celestone [Betamethasone] Nausea Only    Sweated; face and chest with redness for several days    Medications: Outpatient Encounter Medications as of 04/22/2020  Medication Sig Note  . Ascorbic Acid (VITAMIN C) 1000 MG tablet Take 500  mg by mouth daily.   . Biotin w/ Vitamins C & E (HAIR SKIN & NAILS GUMMIES PO) Take 1 each by mouth daily.   . Calcium Carbonate-Vitamin D (CALTRATE 600+D PO) Take 1 capsule by mouth in the morning and at bedtime.   . cycloSPORINE (RESTASIS) 0.05 % ophthalmic emulsion Place 1 drop into both eyes 2 (two) times daily.    . diclofenac Sodium (VOLTAREN) 1 % GEL Apply 2 g topically 4 (four) times daily as needed.   . Ibuprofen-diphenhydrAMINE HCl (ADVIL PM) 200-25 MG CAPS Take 1 capsule by mouth at bedtime as needed.   Marland Kitchen icosapent Ethyl (VASCEPA) 1 g capsule Take 2 capsules (2 g total) by mouth 2 (two) times daily.   . Lancets (ONETOUCH ULTRASOFT) lancets USE AS INSTRUCTED TO CHECK  BLOOD SUGARS DAILY   . losartan (COZAAR) 50 MG tablet Take 1 tablet (50 mg total) by mouth daily.   . Melatonin 5 MG TABS Take 5 mg by mouth daily.   . meloxicam (MOBIC) 7.5 MG tablet TAKE 1 TABLET BY MOUTH  DAILY   . metroNIDAZOLE (METROGEL) 0.75 % gel    . Multiple Vitamins-Minerals (PRESERVISION AREDS PO) Take by mouth.   . ONE TOUCH ULTRA TEST test strip USE TO CHECK BLOOD SUGAR  DAILY   . Polyethyl Glycol-Propyl Glycol 0.4-0.3 % SOLN Place 1 drop into both eyes every morning. 02/02/2018: Pt uses 4-5 times daily  . pravastatin (PRAVACHOL) 40 MG tablet TAKE 1 TABLET BY MOUTH  DAILY   . triamterene-hydrochlorothiazide (MAXZIDE) 75-50 MG tablet Take 0.5 tablets by mouth daily.    No facility-administered encounter medications on file as of  04/22/2020.   Wt Readings from Last 3 Encounters:  04/10/20 159 lb (72.1 kg)  03/03/20 157 lb 6.4 oz (71.4 kg)  10/09/19 160 lb (72.6 kg)   Lab Results  Component Value Date   CREATININE 0.63 04/10/2020   BUN 25 (H) 04/10/2020   GFR 85.16 04/10/2020   GFRNONAA >60 02/18/2015   GFRAA >60 02/18/2015   NA 137 04/10/2020   K 3.8 04/10/2020   CALCIUM 10.0 04/10/2020   CO2 29 04/10/2020    Current Diagnosis/Assessment:   Goals Addressed            This Visit's Progress    . Pharmacy Care Plan   On track    CARE PLAN ENTRY (see longitudinal plan of care for additional care plan information)  Current Barriers:  . Chronic Disease Management support, education, and care coordination needs related to Hypertension, Hyperlipidemia, and Diabetes   Hypertension BP Readings from Last 3 Encounters:  04/10/20 (!) 148/82  03/03/20 (!) 144/75  10/09/19 138/84 .  Pharmacist Clinical Goal(s): o Over the next 180 days, patient will work with PharmD and providers to maintain BP goal <140/90 . Current regimen:  o losartan 50 mg daily, o triamterene/HCTZ 75-50 mg 1/2 tab daily . Interventions: o Discussed BP goals and benefits of medication for prevention of heart attack / stroke . Patient self care activities - Over the next 180 days, patient will: o Check BP 1-2 times weekly, document, and provide at future appointments o Ensure daily salt intake < 2300 mg/day  Hyperlipidemia Lab Results  Component Value Date/Time   LDLDIRECT 100.0 04/10/2020 10:33 AM   TRIG (H) 04/10/2020 10:33 AM    418.0 Triglyceride is over 400; calculations on Lipids are invalid.   TRIG 251 (H) 05/10/2014 09:34 AM .  Pharmacist Clinical Goal(s): o Over the next 180 days, patient will work with PharmD and providers to achieve LDL goal < 100 and Trig < 200 . Current regimen:  o pravastatin 40 mg daily,  o Vascepa 1 g - 2 capsules BID . Interventions: o Discussed cholesterol goals and benefits of medication for prevention of heart attack / stroke o Pursue tier exception for Vascepa (currently Tier 4) . Patient self care activities - Over the next 180 days, patient will: o Continue medications as directed o Reduce cholesterol in diet (see attached) o Increase exercise  Diabetes Lab Results  Component Value Date/Time   HGBA1C 6.6 (H) 04/10/2020 10:33 AM   HGBA1C 6.2 (H) 10/09/2019 11:23 AM .  Pharmacist Clinical Goal(s): o Over the next 180 days, patient will work with PharmD and  providers to maintain A1c goal <7% . Current regimen:  o No medications . Interventions: o Discussed blood sugar goals and benefits of diet and exercise for prevention of diabetes progression . Patient self care activities - Over the next 180 days, patient will: o Continue low sugar and low carb diet o Increase exercise  Medication management . Pharmacist Clinical Goal(s): o Over the next 180 days, patient will work with PharmD and providers to maintain optimal medication adherence . Current pharmacy: [facility] . Interventions o Comprehensive medication review performed. o Continue current medication management strategy . Patient self care activities - Over the next 180 days, patient will: o Focus on medication adherence by fill date o Take medications as prescribed o Report any questions or concerns to PharmD and/or provider(s)  Please see past updates related to this goal by clicking on the "Past Updates" button in  the selected goal        Diabetes   A1c goal < 7% Diet-controlled, no medications indicated.  Recent Relevant Labs: Lab Results  Component Value Date/Time   HGBA1C 6.6 (H) 04/10/2020 10:33 AM   HGBA1C 6.2 (H) 10/09/2019 11:23 AM   EGFR 78 (L) 01/30/2015 02:06 PM   MICROALBUR 1.0 08/14/2015 08:44 AM   MICROALBUR 1.3 05/10/2014 09:34 AM    Last diabetic Foot exam:  Lab Results  Component Value Date/Time   HMDIABEYEEXA No Retinopathy 12/04/2019 12:00 AM    Last diabetic Eye exam: No results found for: HMDIABFOOTEX   Checking BG: Weekly Recent FBG Readings: 87 - 120  We discussed: A1c worsened since last check; pt admits to dietary and lifestyle indiscretions;   Plan  Continue control with diet and exercise   Hypertension   BP goal < 140/90  Office blood pressures are  BP Readings from Last 3 Encounters:  04/10/20 (!) 148/82  03/03/20 (!) 144/75  10/09/19 138/84   Patient checks BP at home infrequently Patient home BP readings are  ranging:   Patient has failed these meds in the past: n/a Patient is currently controlled on the following medications:   Losartan 50 mg daily  Triamterene/HCTZ 75-50 mg 1/2 tab daily  We discussed diet and exercise extensively, blood pressure goals, consequences of elevated BP, proper BP monitoring technique. Also discussed low blood pressure and monitoring for dizziness.  Plan  Continue current medications and control with diet and exercise   Hyperlipidemia   LDL goal < 100  Last lipids Lab Results  Component Value Date   CHOL 222 (H) 04/10/2020   HDL 63.00 04/10/2020   [place]  10/09/2019     Comment:     . LDL cholesterol not calculated. Triglyceride levels greater than 400 mg/dL invalidate calculated LDL results. . Reference range: <100 . Desirable range <100 mg/dL for primary prevention;   <70 mg/dL for patients with CHD or diabetic patients  with > or = 2 CHD risk factors. Marland Kitchen LDL-C is now calculated using the Martin-Hopkins  calculation, which is a validated novel method providing  better accuracy than the Friedewald equation in the  estimation of LDL-C.  Cresenciano Genre et al. Annamaria Helling. 5284;132(44): 2061-2068  (http://education.[employer].com/faq/FAQ164)    LDLDIRECT 100.0 04/10/2020   TRIG (H) 04/10/2020    418.0 Triglyceride is over 400; calculations on Lipids are invalid.   CHOLHDL 4 04/10/2020   The 10-year ASCVD risk score Mikey Bussing DC Jr., et al., 2013) is: 58.4%   Values used to calculate the score:     Age: 84 years     Sex: Female     Is Non-Hispanic African American: No     Diabetic: Yes     Tobacco smoker: No     Systolic Blood Pressure: 010 mmHg     Is BP treated: Yes     HDL Cholesterol: 63 mg/dL     Total Cholesterol: 222 mg/dL   Patient has failed these meds in past: n/a Patient is currently controlled on the following medications:   pravastatin 40 mg daily   Vascepa 1 g - 2 cap BID  We discussed:  diet and exercise extensively; hard  to walk due to knee; pt just picked up Vascepa and was told it would $100 per month; per formulary it is Tier 4 so this is likely accurate; pt does not qualify for [employer] grant due to income greater than limit; may try tier exception since patient has tried  and failed OTC fish oil; may also consider changing statin to high intensity for further LDL and TRIG lowering.  Plan  Continue current medications and control with diet and exercise  Pursue tier exception for Vascepa  Osteoarthritis   Patient has failed these meds in past: n/a Patient is currently controlled on the following medications:   meloxicam 7.5 mg PRN  OTC Voltaren gel  We discussed: pt reports knee pain limits exercise; pt uses NSAID sparingly due to GI upset even with food. She uses Voltaren gel on her knee and on her hands at night, reports it helps a lot.  Plan  Continue current medications   Vaccines   Reviewed and discussed patient's vaccination history.    Immunization History  Administered Date(s) Administered  . Fluad Quad(high Dose 65+) 12/15/2018  . Influenza Whole 02/06/2007, 12/25/2007  . Influenza, High Dose Seasonal PF 01/02/2013, 01/02/2014, 12/13/2016, 12/07/2017, 12/25/2019  . Influenza,inj,quad, With Preservative 11/27/2016  . Influenza-Unspecified 12/26/2014, 12/19/2015, 12/07/2017  . Pneumococcal Conjugate-13 09/26/2014  . Pneumococcal Polysaccharide-23 03/30/2013  . Td 03/28/2013  . Zoster 12/09/2011   Pt and PCP agreed to wait to receive Shingrix until after COVID dies down since the vaccine can cause flu-like symptoms.  Plan  Recommend Shingrix when covid/omicron improves  Medication Management   Pt uses [facility] mail order pharmacy for all medications Does not use pill box - separates by AM and PM  Pt endorses 100% compliance  We discussed:  Pt is managing meds well on her own, very rarely misses doses, re-orders meds online through mail order and denies issues. She currently  gets all generics for $0.   Plan  Continue current med management strategy     Follow up: 6 month phone visit  Charlene Brooke, PharmD, [facility] Clinical Pharmacist [facility] at [facility] [phone]

## 2020-04-23 ENCOUNTER — Other Ambulatory Visit: Payer: Self-pay | Admitting: Internal Medicine

## 2020-04-23 ENCOUNTER — Telehealth: Payer: Self-pay | Admitting: Internal Medicine

## 2020-04-23 NOTE — Telephone Encounter (Signed)
Tiffany Hood w/ [facility] called and said that they did not receive the name of the medication that is needing the PA, and also the information for the provider/ prescriber.    Phone: [phone]

## 2020-05-02 ENCOUNTER — Telehealth: Payer: Self-pay | Admitting: Pharmacist

## 2020-05-02 NOTE — Progress Notes (Signed)
Chronic Care Management Pharmacy Assistant   Name: Tiffany Hood  MRN: [id number] DOB: [birth year]  Reason for Encounter: General Adherence Call   PCP : Binnie Rail, MD  Allergies:   Allergies  Allergen Reactions   Celestone [Betamethasone] Nausea Only    Sweated; face and chest with redness for several days     Medications: Outpatient Encounter Medications as of 05/02/2020  Medication Sig Note   Ascorbic Acid (VITAMIN C) 1000 MG tablet Take 500 mg by mouth daily.    Biotin w/ Vitamins C & E (HAIR SKIN & NAILS GUMMIES PO) Take 1 each by mouth daily.    Calcium Carbonate-Vitamin D (CALTRATE 600+D PO) Take 1 capsule by mouth in the morning and at bedtime.    cycloSPORINE (RESTASIS) 0.05 % ophthalmic emulsion Place 1 drop into both eyes 2 (two) times daily.     diclofenac Sodium (VOLTAREN) 1 % GEL Apply 2 g topically 4 (four) times daily as needed.    Ibuprofen-diphenhydrAMINE HCl (ADVIL PM) 200-25 MG CAPS Take 1 capsule by mouth at bedtime as needed.    icosapent Ethyl (VASCEPA) 1 g capsule Take 2 capsules (2 g total) by mouth 2 (two) times daily.    Lancets (ONETOUCH ULTRASOFT) lancets USE AS INSTRUCTED TO CHECK  BLOOD SUGARS DAILY    losartan (COZAAR) 50 MG tablet Take 1 tablet (50 mg total) by mouth daily.    Melatonin 5 MG TABS Take 5 mg by mouth daily.    meloxicam (MOBIC) 7.5 MG tablet TAKE 1 TABLET BY MOUTH  DAILY    metroNIDAZOLE (METROGEL) 0.75 % gel     Multiple Vitamins-Minerals (PRESERVISION AREDS PO) Take by mouth.    ONE TOUCH ULTRA TEST test strip USE TO CHECK BLOOD SUGAR  DAILY    Polyethyl Glycol-Propyl Glycol 0.4-0.3 % SOLN Place 1 drop into both eyes every morning. 02/02/2018: Pt uses 4-5 times daily   pravastatin (PRAVACHOL) 40 MG tablet TAKE 1 TABLET BY MOUTH  DAILY    triamterene-hydrochlorothiazide (MAXZIDE) 75-50 MG tablet TAKE ONE-HALF TABLET BY  MOUTH DAILY    No facility-administered encounter medications on file as of 05/02/2020.     Current Diagnosis: Patient Active Problem List   Diagnosis Date Noted   Multiple thyroid nodules 08/12/2016   Diabetes ([place]) 08/08/2015   Spinal stenosis, lumbar region, with neurogenic claudication 02/19/2015   Osteopenia 03/30/2013   Diverticulitis of sigmoid colon s/p sigmoidectomy 10/05/2012   Diverticulosis 10/19/2011   Osteoarthritis 01/28/2010   Hyperlipidemia 12/25/2007   Essential hypertension 02/10/2007   History of cancer of left breast 05/02/2002    Goals Addressed   None     Follow-Up:  Pharmacist Review   A general adherence and wellness call was made to Tiffany Hood to ask how she has been doing since she last spoke with the clinical pharmacist Mendel Ryder. The patient states that she has been doing good and that she has not had any health issues. She also states that she has not seen any other providers since she last spoke with Mendel Ryder and that she has not had any medication changes. She stated that she is taking Vascepa and it seems to be working well. She stated that she is paying about $87.00 for the medication and hopes that the price will not go up. The patient does not have any side effects from medications and has no concerns about her health at this time. She did mention that her blood pressure has been a little high  lately but she is not having any dizziness or light headedness. She states that she the last time she took her blood sugar which was around a week ago it was 125. Over all the patient states that she is not having any major problems. I let her know that I will pass along the information to [place].    Wendy Poet, [place] [phone]

## 2020-05-23 ENCOUNTER — Telehealth: Payer: Self-pay | Admitting: Pharmacist

## 2020-05-23 NOTE — Progress Notes (Signed)
    Chronic Care Management Pharmacy Assistant   Name: STEPHANI JANAK  MRN: [id number] DOB: 19-Mar-1941  Reason for Encounter: Chart Review  PCP : Binnie Rail, MD  Allergies:   Allergies  Allergen Reactions  . Celestone [Betamethasone] Nausea Only    Sweated; face and chest with redness for several days     Medications: Outpatient Encounter Medications as of 05/23/2020  Medication Sig Note  . Ascorbic Acid (VITAMIN C) 1000 MG tablet Take 500 mg by mouth daily.   . Biotin w/ Vitamins C & E (HAIR SKIN & NAILS GUMMIES PO) Take 1 each by mouth daily.   . Calcium Carbonate-Vitamin D (CALTRATE 600+D PO) Take 1 capsule by mouth in the morning and at bedtime.   . cycloSPORINE (RESTASIS) 0.05 % ophthalmic emulsion Place 1 drop into both eyes 2 (two) times daily.    . diclofenac Sodium (VOLTAREN) 1 % GEL Apply 2 g topically 4 (four) times daily as needed.   . Ibuprofen-diphenhydrAMINE HCl (ADVIL PM) 200-25 MG CAPS Take 1 capsule by mouth at bedtime as needed.   Marland Kitchen icosapent Ethyl (VASCEPA) 1 g capsule Take 2 capsules (2 g total) by mouth 2 (two) times daily.   . Lancets (ONETOUCH ULTRASOFT) lancets USE AS INSTRUCTED TO CHECK  BLOOD SUGARS DAILY   . losartan (COZAAR) 50 MG tablet Take 1 tablet (50 mg total) by mouth daily.   . Melatonin 5 MG TABS Take 5 mg by mouth daily.   . meloxicam (MOBIC) 7.5 MG tablet TAKE 1 TABLET BY MOUTH  DAILY   . metroNIDAZOLE (METROGEL) 0.75 % gel    . Multiple Vitamins-Minerals (PRESERVISION AREDS PO) Take by mouth.   . ONE TOUCH ULTRA TEST test strip USE TO CHECK BLOOD SUGAR  DAILY   . Polyethyl Glycol-Propyl Glycol 0.4-0.3 % SOLN Place 1 drop into both eyes every morning. 02/02/2018: Pt uses 4-5 times daily  . pravastatin (PRAVACHOL) 40 MG tablet TAKE 1 TABLET BY MOUTH  DAILY   . triamterene-hydrochlorothiazide (MAXZIDE) 75-50 MG tablet TAKE ONE-HALF TABLET BY  MOUTH DAILY    No facility-administered encounter medications on file as of 05/23/2020.     Current Diagnosis: Patient Active Problem List   Diagnosis Date Noted  . Multiple thyroid nodules 08/12/2016  . Diabetes ([place]) 08/08/2015  . Spinal stenosis, lumbar region, with neurogenic claudication 02/19/2015  . Osteopenia 03/30/2013  . Diverticulitis of sigmoid colon s/p sigmoidectomy 10/05/2012  . Diverticulosis 10/19/2011  . Osteoarthritis 01/28/2010  . Hyperlipidemia 12/25/2007  . Essential hypertension 02/10/2007  . History of cancer of left breast 05/02/2002    Goals Addressed   None     Follow-Up:  Pharmacist Review   Reviewed chart for medication changes and adherence.  No office visit, consults, or hospital visits since last care coordination with pharmacist No medication changes indicated  No gaps in adherence identified. Patient has follow up scheduled with pharmacy team. No further action required.   Wendy Poet, [place] [phone]

## 2020-07-21 ENCOUNTER — Other Ambulatory Visit: Payer: Self-pay

## 2020-07-21 ENCOUNTER — Ambulatory Visit (INDEPENDENT_AMBULATORY_CARE_PROVIDER_SITE_OTHER): Payer: Medicare Other

## 2020-07-21 VITALS — BP 140/82 | HR 91 | Temp 98.1°F | Resp 16 | Ht 63.0 in | Wt 161.8 lb

## 2020-07-21 DIAGNOSIS — Z0000 Encounter for general adult medical examination without abnormal findings: Secondary | ICD-10-CM

## 2020-07-21 NOTE — Patient Instructions (Addendum)
Tiffany Hood , Thank you for taking time to come for your Medicare Wellness Visit. I appreciate your ongoing commitment to your health goals. Please review the following plan we discussed and let me know if I can assist you in the future.   Screening recommendations/referrals: Colonoscopy: 05/19/2018; no repeat due to age 80: 11/23/2019; due every 1-2 years Bone Density: 11/23/2019; due every 3 years Recommended yearly ophthalmology/optometry visit for glaucoma screening and checkup Recommended yearly dental visit for hygiene and checkup  Vaccinations: Influenza vaccine: 12/25/2019 Pneumococcal vaccine: 03/30/2013, 09/26/2014 (completed) Tdap vaccine: 03/28/2013; due every 10 years Shingrix vaccine: Please call your insurance company to determine your out of pocket expense for the Shingrix vaccine. You may receive this vaccine at your local pharmacy.  Covid-19: Pfizer 05/04/2019, 05/25/2019, 01/07/2020  Advanced directives: Advance directive discussed with you today. I have provided a copy for you to complete at home and have notarized. Once this is complete please bring a copy in to our office so we can scan it into your chart.  Conditions/risks identified: Yes; Reviewed health maintenance screenings with patient today and relevant education, vaccines, and/or referrals were provided. Please continue to do your personal lifestyle choices by: daily care of teeth and gums, regular physical activity (goal should be 5 days a week for 30 minutes), eat a healthy diet, avoid tobacco and drug use, limiting any alcohol intake, taking a low-dose aspirin (if not allergic or have been advised by your provider otherwise) and taking vitamins and minerals as recommended by your provider. Continue doing brain stimulating activities (puzzles, reading, adult coloring books, staying active) to keep memory sharp. Continue to eat heart healthy diet (full of fruits, vegetables, whole grains, lean protein, water--limit  salt, fat, and sugar intake) and increase physical activity as tolerated.  Next appointment: Please schedule your next Medicare Wellness Visit with your Nurse Health Advisor in 1 year by calling [phone].  Preventive Care 79 Years and Older, Female Preventive care refers to lifestyle choices and visits with your health care provider that can promote health and wellness. What does preventive care include?  A yearly physical exam. This is also called an annual well check.  Dental exams once or twice a year.  Routine eye exams. Ask your health care provider how often you should have your eyes checked.  Personal lifestyle choices, including:  Daily care of your teeth and gums.  Regular physical activity.  Eating a healthy diet.  Avoiding tobacco and drug use.  Limiting alcohol use.  Practicing safe sex.  Taking low-dose aspirin every day.  Taking vitamin and mineral supplements as recommended by your health care provider. What happens during an annual well check? The services and screenings done by your health care provider during your annual well check will depend on your age, overall health, lifestyle risk factors, and family history of disease. Counseling  Your health care provider may ask you questions about your:  Alcohol use.  Tobacco use.  Drug use.  Emotional well-being.  Home and relationship well-being.  Sexual activity.  Eating habits.  History of falls.  Memory and ability to understand (cognition).  Work and work Statistician.  Reproductive health. Screening  You may have the following tests or measurements:  Height, weight, and BMI.  Blood pressure.  Lipid and cholesterol levels. These may be checked every 5 years, or more frequently if you are over 91 years old.  Skin check.  Lung cancer screening. You may have this screening every year starting at age 70  if you have a 30-pack-year history of smoking and currently smoke or have quit  within the past 15 years.  Fecal occult blood test (FOBT) of the stool. You may have this test every year starting at age 1.  Flexible sigmoidoscopy or colonoscopy. You may have a sigmoidoscopy every 5 years or a colonoscopy every 10 years starting at age 89.  Hepatitis C blood test.  Hepatitis B blood test.  Sexually transmitted disease (STD) testing.  Diabetes screening. This is done by checking your blood sugar (glucose) after you have not eaten for a while (fasting). You may have this done every 1-3 years.  Bone density scan. This is done to screen for osteoporosis. You may have this done starting at age 48.  Mammogram. This may be done every 1-2 years. Talk to your health care provider about how often you should have regular mammograms. Talk with your health care provider about your test results, treatment options, and if necessary, the need for more tests. Vaccines  Your health care provider may recommend certain vaccines, such as:  Influenza vaccine. This is recommended every year.  Tetanus, diphtheria, and acellular pertussis (Tdap, Td) vaccine. You may need a Td booster every 10 years.  Zoster vaccine. You may need this after age 13.  Pneumococcal 13-valent conjugate (PCV13) vaccine. One dose is recommended after age 73.  Pneumococcal polysaccharide (PPSV23) vaccine. One dose is recommended after age 94. Talk to your health care provider about which screenings and vaccines you need and how often you need them. This information is not intended to replace advice given to you by your health care provider. Make sure you discuss any questions you have with your health care provider. Document Released: 04/11/2015 Document Revised: 12/03/2015 Document Reviewed: 01/14/2015 Elsevier Interactive Patient Education  2017 [place] Prevention in the Home Falls can cause injuries. They can happen to people of all ages. There are many things you can do to make your home safe  and to help prevent falls. What can I do on the outside of my home?  Regularly fix the edges of walkways and driveways and fix any cracks.  Remove anything that might make you trip as you walk through a door, such as a raised step or threshold.  Trim any bushes or trees on the path to your home.  Use bright outdoor lighting.  Clear any walking paths of anything that might make someone trip, such as rocks or tools.  Regularly check to see if handrails are loose or broken. Make sure that both sides of any steps have handrails.  Any raised decks and porches should have guardrails on the edges.  Have any leaves, snow, or ice cleared regularly.  Use sand or salt on walking paths during winter.  Clean up any spills in your garage right away. This includes oil or grease spills. What can I do in the bathroom?  Use night lights.  Install grab bars by the toilet and in the tub and shower. Do not use towel bars as grab bars.  Use non-skid mats or decals in the tub or shower.  If you need to sit down in the shower, use a plastic, non-slip stool.  Keep the floor dry. Clean up any water that spills on the floor as soon as it happens.  Remove soap buildup in the tub or shower regularly.  Attach bath mats securely with double-sided non-slip rug tape.  Do not have throw rugs and other things on the floor  that can make you trip. What can I do in the bedroom?  Use night lights.  Make sure that you have a light by your bed that is easy to reach.  Do not use any sheets or blankets that are too big for your bed. They should not hang down onto the floor.  Have a firm chair that has side arms. You can use this for support while you get dressed.  Do not have throw rugs and other things on the floor that can make you trip. What can I do in the kitchen?  Clean up any spills right away.  Avoid walking on wet floors.  Keep items that you use a lot in easy-to-reach places.  If you need to  reach something above you, use a strong step stool that has a grab bar.  Keep electrical cords out of the way.  Do not use floor polish or wax that makes floors slippery. If you must use wax, use non-skid floor wax.  Do not have throw rugs and other things on the floor that can make you trip. What can I do with my stairs?  Do not leave any items on the stairs.  Make sure that there are handrails on both sides of the stairs and use them. Fix handrails that are broken or loose. Make sure that handrails are as long as the stairways.  Check any carpeting to make sure that it is firmly attached to the stairs. Fix any carpet that is loose or worn.  Avoid having throw rugs at the top or bottom of the stairs. If you do have throw rugs, attach them to the floor with carpet tape.  Make sure that you have a light switch at the top of the stairs and the bottom of the stairs. If you do not have them, ask someone to add them for you. What else can I do to help prevent falls?  Wear shoes that:  Do not have high heels.  Have rubber bottoms.  Are comfortable and fit you well.  Are closed at the toe. Do not wear sandals.  If you use a stepladder:  Make sure that it is fully opened. Do not climb a closed stepladder.  Make sure that both sides of the stepladder are locked into place.  Ask someone to hold it for you, if possible.  Clearly mark and make sure that you can see:  Any grab bars or handrails.  First and last steps.  Where the edge of each step is.  Use tools that help you move around (mobility aids) if they are needed. These include:  Canes.  Walkers.  Scooters.  Crutches.  Turn on the lights when you go into a dark area. Replace any light bulbs as soon as they burn out.  Set up your furniture so you have a clear path. Avoid moving your furniture around.  If any of your floors are uneven, fix them.  If there are any pets around you, be aware of where they  are.  Review your medicines with your doctor. Some medicines can make you feel dizzy. This can increase your chance of falling. Ask your doctor what other things that you can do to help prevent falls. This information is not intended to replace advice given to you by your health care provider. Make sure you discuss any questions you have with your health care provider. Document Released: 01/09/2009 Document Revised: 08/21/2015 Document Reviewed: 04/19/2014 Elsevier Interactive Patient Education  2017 [employer]  

## 2020-07-21 NOTE — Progress Notes (Signed)
Subjective:   Tiffany Hood is a 80 y.o. female who presents for Medicare Annual (Subsequent) preventive examination.  Review of Systems    No ROS. Medicare Wellness Visit. Additional risk factors are reflected in social history. Cardiac Risk Factors include: advanced age (>39men, >62 women);diabetes mellitus;dyslipidemia;family history of premature cardiovascular disease;hypertension     Objective:    Today's Vitals   07/21/20 1221  BP: 140/82  Pulse: 91  Resp: 16  Temp: 98.1 F (36.7 C)  SpO2: 96%  Weight: 161 lb 12.8 oz (73.4 kg)  Height: 5\' 3"  (1.6 m)  PainSc: 0-No pain   Body mass index is 28.66 kg/m.  Advanced Directives 07/21/2020 03/03/2020 05/06/2015 02/19/2015 02/18/2015 01/30/2015 04/23/2013  Does Patient Have a Medical Advance Directive? No No No No No No Patient does not have advance directive  Would patient like information on creating a medical advance directive? Yes (MAU/Ambulatory/Procedural Areas - Information given) Yes (MAU/Ambulatory/Procedural Areas - Information given) - - No - patient declined information No - patient declined information -    Current Medications (verified) Outpatient Encounter Medications as of 07/21/2020  Medication Sig  . Ascorbic Acid (VITAMIN C) 1000 MG tablet Take 500 mg by mouth daily.  . Biotin w/ Vitamins C & E (HAIR SKIN & NAILS GUMMIES PO) Take 1 each by mouth daily.  . Calcium Carbonate-Vitamin D (CALTRATE 600+D PO) Take 1 capsule by mouth in the morning and at bedtime.  . cycloSPORINE (RESTASIS) 0.05 % ophthalmic emulsion Place 1 drop into both eyes 2 (two) times daily.   . diclofenac Sodium (VOLTAREN) 1 % GEL Apply 2 g topically 4 (four) times daily as needed.  . Ibuprofen-diphenhydrAMINE HCl (ADVIL PM) 200-25 MG CAPS Take 1 capsule by mouth at bedtime as needed.  Marland Kitchen icosapent Ethyl (VASCEPA) 1 g capsule Take 2 capsules (2 g total) by mouth 2 (two) times daily.  . Lancets (ONETOUCH ULTRASOFT) lancets USE AS INSTRUCTED TO  CHECK  BLOOD SUGARS DAILY  . losartan (COZAAR) 50 MG tablet Take 1 tablet (50 mg total) by mouth daily.  . Melatonin 5 MG TABS Take 5 mg by mouth daily.  . meloxicam (MOBIC) 7.5 MG tablet TAKE 1 TABLET BY MOUTH  DAILY  . metroNIDAZOLE (METROGEL) 0.75 % gel   . Multiple Vitamins-Minerals (PRESERVISION AREDS PO) Take by mouth.  . ONE TOUCH ULTRA TEST test strip USE TO CHECK BLOOD SUGAR  DAILY  . Polyethyl Glycol-Propyl Glycol 0.4-0.3 % SOLN Place 1 drop into both eyes every morning.  . pravastatin (PRAVACHOL) 40 MG tablet TAKE 1 TABLET BY MOUTH  DAILY  . triamterene-hydrochlorothiazide (MAXZIDE) 75-50 MG tablet TAKE ONE-HALF TABLET BY  MOUTH DAILY   No facility-administered encounter medications on file as of 07/21/2020.    Allergies (verified) Celestone [betamethasone]   History: Past Medical History:  Diagnosis Date  . Breast cancer ([place]) left-2004 sx  . Diabetes mellitus    TYPE 2  . Diverticulitis    09/14/12  . Diverticulosis   . Heart murmur   . History of radiation therapy   . History of urinary tract infection   . Hyperlipidemia   . Hypertension   . Osteoporosis    Fosamax Rxed 2/13  . Personal history of colonic adenomas 11/15/2012  . Personal history of radiation therapy 06/2002  . Pneumonia    2013  . Seasonal allergies   . Tingling    right leg    Past Surgical History:  Procedure Laterality Date  . ABDOMINAL HYSTERECTOMY  1965   ovaries remain; for dysfunctional menses; ? endometriosis  . ABDOMINAL SURGERY    . BREAST BIOPSY Left 04/16/2002   malignant  . BREAST LUMPECTOMY Left   . colon polyps  2014   Dr Carlean Purl  . COLONOSCOPY  2004    Tics  . DILATION AND CURETTAGE OF UTERUS    . LAPAROSCOPIC SIGMOID COLECTOMY  04/13/2013   DR TOTH  . LAPAROSCOPIC SIGMOID COLECTOMY N/A 04/13/2013   Procedure: LAPAROSCOPIC ASSISTED SIGMOID COLECTOMY;  Surgeon: Merrie Roof, MD;  Location: [facility];  Service: General;  Laterality: N/A;  . LUMBAR  LAMINECTOMY/DECOMPRESSION MICRODISCECTOMY N/A 02/19/2015   Procedure: MICRODISCECTOMY AT L4-L5, CENTRAL DECOMPRESSION L4-L5 FOR SPINAL STENOSIS, FORAMINOTOMY FOR L4 L5 ROOT ON RIGHT;  Surgeon: Latanya Maudlin, MD;  Location: [facility];  Service: Orthopedics;  Laterality: N/A;  . removal bone spur Left 2/14   2nd finger  . TONSILLECTOMY AND ADENOIDECTOMY     Family History  Problem Relation Age of Onset  . Stroke Mother 4  . Hypertension Mother   . Lung cancer Father        smoker  . Cancer - Lung Father   . Hypertension Brother   . Heart disease Brother 11  . Stroke Brother 33  . Hypertension Brother   . Hypertension Sister   . Diabetes Neg Hx   . Colon cancer Neg Hx   . Esophageal cancer Neg Hx   . Rectal cancer Neg Hx   . Stomach cancer Neg Hx   . Colon polyps Neg Hx    Social History   Socioeconomic History  . Marital status: Widowed    Spouse name: Not on file  . Number of children: Not on file  . Years of education: Not on file  . Highest education level: Not on file  Occupational History  . Not on file  Tobacco Use  . Smoking status: Never Smoker  . Smokeless tobacco: Never Used  Vaping Use  . Vaping Use: Never used  Substance and Sexual Activity  . Alcohol use: No    Alcohol/week: 0.0 standard drinks  . Drug use: No  . Sexual activity: Not on file  Other Topics Concern  . Not on file  Social History Narrative   Exercise: no   Social Determinants of Health   Financial Resource Strain: Low Risk   . Difficulty of Paying Living Expenses: Not hard at all  Food Insecurity: No Food Insecurity  . Worried About Charity fundraiser in the Last Year: Never true  . Ran Out of Food in the Last Year: Never true  Transportation Needs: No Transportation Needs  . Lack of Transportation (Medical): No  . Lack of Transportation (Non-Medical): No  Physical Activity: Sufficiently Active  . Days of Exercise per Week: 5 days  . Minutes of Exercise per Session: 30 min   Stress: No Stress Concern Present  . Feeling of Stress : Not at all  Social Connections: Moderately Integrated  . Frequency of Communication with Friends and Family: More than three times a week  . Frequency of Social Gatherings with Friends and Family: More than three times a week  . Attends Religious Services: More than 4 times per year  . Active Member of Clubs or Organizations: No  . Attends Archivist Meetings: More than 4 times per year  . Marital Status: Widowed    Tobacco Counseling Counseling given: Not Answered   Clinical Intake:  Pre-visit preparation completed: Yes  Pain : No/denies pain Pain Score: 0-No pain     BMI - recorded: 28.66 Nutritional Status: BMI 25 -29 Overweight Nutritional Risks: None Diabetes: Yes CBG done?: No Did pt. bring in CBG monitor from home?: No  How often do you need to have someone help you when you read instructions, pamphlets, or other written materials from your doctor or pharmacy?: 1 - Never What is the last grade level you completed in school?: High School Graduate  Diabetic? yes  Interpreter Needed?: No  Information entered by :: Susie Cassette, LPN   Activities of Daily Living In your present state of health, do you have any difficulty performing the following activities: 07/21/2020 04/10/2020  Hearing? N N  Vision? N N  Difficulty concentrating or making decisions? N N  Walking or climbing stairs? N N  Dressing or bathing? N N  Doing errands, shopping? N N  Preparing Food and eating ? N -  Using the Toilet? N -  In the past six months, have you accidently leaked urine? N -  Do you have problems with loss of bowel control? N -  Managing your Medications? N -  Managing your Finances? N -  Housekeeping or managing your Housekeeping? N -  Some recent data might be hidden    Patient Care Team: [name], MD as PCP - General (Internal Medicine) Kathyrn Sheriff, [facility] as Pharmacist  (Pharmacist) Axel Filler Larna Daughters, NP as Nurse Practitioner (Hematology and Oncology)  Indicate any recent Medical Services you may have received from other than [facility] providers in the past year (date may be approximate).     Assessment:   This is a routine wellness examination for Elizabet.  Hearing/Vision screen No exam data present  Dietary issues and exercise activities discussed: Current Exercise Habits: The patient does not participate in regular exercise at present (yard work, gardening), Exercise limited by: orthopedic condition(s)  Goals    . Exercise 150 minutes per week (moderate activity)     Exercise at least 30 minutes walk x 5 days a week;      . Pharmacy Care Plan     CARE PLAN ENTRY (see longitudinal plan of care for additional care plan information)  Current Barriers:  . Chronic Disease Management support, education, and care coordination needs related to Hypertension, Hyperlipidemia, and Diabetes   Hypertension BP Readings from Last 3 Encounters:  04/10/20 (!) 148/82  03/03/20 (!) 144/75  10/09/19 138/84 .  Pharmacist Clinical Goal(s): o Over the next 180 days, patient will work with PharmD and providers to maintain BP goal <140/90 . Current regimen:  o losartan 50 mg daily, o triamterene/HCTZ 75-50 mg 1/2 tab daily . Interventions: o Discussed BP goals and benefits of medication for prevention of heart attack / stroke . Patient self care activities - Over the next 180 days, patient will: o Check BP 1-2 times weekly, document, and provide at future appointments o Ensure daily salt intake < 2300 mg/day  Hyperlipidemia Lab Results  Component Value Date/Time   LDLDIRECT 100.0 04/10/2020 10:33 AM   TRIG (H) 04/10/2020 10:33 AM    418.0 Triglyceride is over 400; calculations on Lipids are invalid.   TRIG 251 (H) 05/10/2014 09:34 AM .  Pharmacist Clinical Goal(s): o Over the next 180 days, patient will work with PharmD and providers to achieve LDL goal  < 100 and Trig < 200 . Current regimen:  o pravastatin 40 mg daily,  o Vascepa 1 g - 2 capsules BID .  Interventions: o Discussed cholesterol goals and benefits of medication for prevention of heart attack / stroke o Pursue tier exception for Vascepa (currently Tier 4) . Patient self care activities - Over the next 180 days, patient will: o Continue medications as directed o Reduce cholesterol in diet (see attached) o Increase exercise  Diabetes Lab Results  Component Value Date/Time   HGBA1C 6.6 (H) 04/10/2020 10:33 AM   HGBA1C 6.2 (H) 10/09/2019 11:23 AM .  Pharmacist Clinical Goal(s): o Over the next 180 days, patient will work with PharmD and providers to maintain A1c goal <7% . Current regimen:  o No medications . Interventions: o Discussed blood sugar goals and benefits of diet and exercise for prevention of diabetes progression . Patient self care activities - Over the next 180 days, patient will: o Continue low sugar and low carb diet o Increase exercise  Medication management . Pharmacist Clinical Goal(s): o Over the next 180 days, patient will work with PharmD and providers to maintain optimal medication adherence . Current pharmacy: [facility] mail order . Interventions o Comprehensive medication review performed. o Continue current medication management strategy . Patient self care activities - Over the next 180 days, patient will: o Focus on medication adherence by fill date o Take medications as prescribed o Report any questions or concerns to PharmD and/or provider(s)  Please see past updates related to this goal by clicking on the "Past Updates" button in the selected goal       Depression Screen PHQ 2/9 Scores 07/21/2020 10/11/2019 10/05/2018 08/10/2016 05/06/2015 05/09/2014 03/30/2013  PHQ - 2 Score 0 0 0 0 0 0 0  Exception Documentation - - - - - Patient refusal -    Fall Risk Fall Risk  07/21/2020 10/09/2019 10/05/2018 02/09/2018 08/10/2016  Falls in the past year?  0 0 0 0 No  Number falls in past yr: 0 0 0 0 -  Injury with Fall? 0 0 - - -  Risk for fall due to : No Fall Risks No Fall Risks - - -  Follow up Falls evaluation completed - - - -    FALL RISK PREVENTION PERTAINING TO THE HOME:  Any stairs in or around the home? No  If so, are there any without handrails? No  Home free of loose throw rugs in walkways, pet beds, electrical cords, etc? Yes  Adequate lighting in your home to reduce risk of falls? Yes   ASSISTIVE DEVICES UTILIZED TO PREVENT FALLS:  Life alert? No  Use of a cane, walker or w/c? No  Grab bars in the bathroom? No  Shower chair or bench in shower? No  Elevated toilet seat or a handicapped toilet? Yes   TIMED UP AND GO:  Was the test performed? No .  Length of time to ambulate 10 feet: 0 sec.   Gait steady and fast without use of assistive device  Cognitive Function: Normal cognitive status assessed by direct observation by this Nurse Health Advisor. No abnormalities found.         Immunizations Immunization History  Administered Date(s) Administered  . Fluad Quad(high Dose 65+) 12/15/2018  . Influenza Whole 02/06/2007, 12/25/2007  . Influenza, High Dose Seasonal PF 01/02/2013, 01/02/2014, 12/13/2016, 12/07/2017, 12/25/2019  . Influenza,inj,quad, With Preservative 11/27/2016  . Influenza-Unspecified 12/26/2014, 12/19/2015, 12/07/2017  . Pneumococcal Conjugate-13 09/26/2014  . Pneumococcal Polysaccharide-23 03/30/2013  . Td 03/28/2013  . Zoster 12/09/2011    TDAP status: Up to date  Flu Vaccine status: Up to date  Pneumococcal vaccine status: Up to date  Covid-19 vaccine status: Completed vaccines  Qualifies for Shingles Vaccine? Yes   Zostavax completed Yes   Shingrix Completed?: No.    Education has been provided regarding the importance of this vaccine. Patient has been advised to call insurance company to determine out of pocket expense if they have not yet received this vaccine. Advised may also  receive vaccine at local pharmacy or Health Dept. Verbalized acceptance and understanding.  Screening Tests Health Maintenance  Topic Date Due  . COVID-19 Vaccine (1) Never done  . FOOT EXAM  04/09/2020  . HEMOGLOBIN A1C  10/08/2020  . INFLUENZA VACCINE  10/27/2020  . OPHTHALMOLOGY EXAM  12/03/2020  . DEXA SCAN  11/23/2022  . TETANUS/TDAP  03/29/2023  . Hepatitis C Screening  Completed  . PNA vac Low Risk Adult  Completed  . HPV VACCINES  Aged Out    Health Maintenance  Health Maintenance Due  Topic Date Due  . COVID-19 Vaccine (1) Never done  . FOOT EXAM  04/09/2020    Colorectal cancer screening: No longer required.   Mammogram status: Completed 11/23/2019. Repeat every year  Bone Density status: Completed 11/23/2019. Results reflect: Bone density results: OSTEOPENIA. Repeat every 2 years.  Lung Cancer Screening: (Low Dose CT Chest recommended if Age 37-80 years, 30 pack-year currently smoking OR have quit w/in 15years.) does not qualify.   Lung Cancer Screening Referral: no  Additional Screening:  Hepatitis C Screening: does qualify; Completed yes  Vision Screening: Recommended annual ophthalmology exams for early detection of glaucoma and other disorders of the eye. Is the patient up to date with their annual eye exam?  Yes  Who is the provider or what is the name of the office in which the patient attends annual eye exams? [facility] If pt is not established with a provider, would they like to be referred to a provider to establish care? No .   Dental Screening: Recommended annual dental exams for proper oral hygiene  Community Resource Referral / Chronic Care Management: CRR required this visit?  No   CCM required this visit?  No      Plan:     I have personally reviewed and noted the following in the patient's chart:   . Medical and social history . Use of alcohol, tobacco or illicit drugs  . Current medications and supplements . Functional  ability and status . Nutritional status . Physical activity . Advanced directives . List of other physicians . Hospitalizations, surgeries, and ER visits in previous 12 months . Vitals . Screenings to include cognitive, depression, and falls . Referrals and appointments  In addition, I have reviewed and discussed with patient certain preventive protocols, quality metrics, and best practice recommendations. A written personalized care plan for preventive services as well as general preventive health recommendations were provided to patient.     Sheral Flow, LPN   1/61/0960   Nurse Notes:  Medications reviewed with patient; no opioid use noted.

## 2020-07-24 ENCOUNTER — Ambulatory Visit (HOSPITAL_COMMUNITY)
Admission: RE | Admit: 2020-07-24 | Discharge: 2020-07-24 | Disposition: A | Discharge status: Home / Self Care | Payer: Medicare Other | Source: Ambulatory Visit | Attending: Sports Medicine | Admitting: Sports Medicine

## 2020-07-24 ENCOUNTER — Other Ambulatory Visit: Payer: Self-pay

## 2020-07-24 ENCOUNTER — Other Ambulatory Visit (HOSPITAL_COMMUNITY): Payer: Self-pay | Admitting: Sports Medicine

## 2020-07-24 DIAGNOSIS — M79605 Pain in left leg: Secondary | ICD-10-CM | POA: Diagnosis not present

## 2020-07-24 DIAGNOSIS — M79609 Pain in unspecified limb: Secondary | ICD-10-CM

## 2020-07-24 DIAGNOSIS — M79604 Pain in right leg: Secondary | ICD-10-CM | POA: Diagnosis not present

## 2020-07-24 DIAGNOSIS — M79662 Pain in left lower leg: Secondary | ICD-10-CM | POA: Diagnosis not present

## 2020-07-24 DIAGNOSIS — M79661 Pain in right lower leg: Secondary | ICD-10-CM | POA: Diagnosis not present

## 2020-08-04 DIAGNOSIS — H0102A Squamous blepharitis right eye, upper and lower eyelids: Secondary | ICD-10-CM | POA: Diagnosis not present

## 2020-08-04 DIAGNOSIS — H401112 Primary open-angle glaucoma, right eye, moderate stage: Secondary | ICD-10-CM | POA: Diagnosis not present

## 2020-08-04 DIAGNOSIS — H0102B Squamous blepharitis left eye, upper and lower eyelids: Secondary | ICD-10-CM | POA: Diagnosis not present

## 2020-08-04 DIAGNOSIS — Z9889 Other specified postprocedural states: Secondary | ICD-10-CM | POA: Diagnosis not present

## 2020-08-04 DIAGNOSIS — H2513 Age-related nuclear cataract, bilateral: Secondary | ICD-10-CM | POA: Diagnosis not present

## 2020-08-04 DIAGNOSIS — H401121 Primary open-angle glaucoma, left eye, mild stage: Secondary | ICD-10-CM | POA: Diagnosis not present

## 2020-08-04 DIAGNOSIS — E119 Type 2 diabetes mellitus without complications: Secondary | ICD-10-CM | POA: Diagnosis not present

## 2020-08-04 DIAGNOSIS — H16223 Keratoconjunctivitis sicca, not specified as Sjogren's, bilateral: Secondary | ICD-10-CM | POA: Diagnosis not present

## 2020-08-07 DIAGNOSIS — M79605 Pain in left leg: Secondary | ICD-10-CM | POA: Diagnosis not present

## 2020-08-18 DIAGNOSIS — L814 Other melanin hyperpigmentation: Secondary | ICD-10-CM | POA: Diagnosis not present

## 2020-08-18 DIAGNOSIS — L821 Other seborrheic keratosis: Secondary | ICD-10-CM | POA: Diagnosis not present

## 2020-08-18 DIAGNOSIS — L820 Inflamed seborrheic keratosis: Secondary | ICD-10-CM | POA: Diagnosis not present

## 2020-08-18 DIAGNOSIS — L570 Actinic keratosis: Secondary | ICD-10-CM | POA: Diagnosis not present

## 2020-08-21 ENCOUNTER — Telehealth: Payer: Self-pay | Admitting: Pharmacist

## 2020-08-26 NOTE — Telephone Encounter (Signed)
Per chart DVT was ruled out via vascular ultrasound on 07/28/20.

## 2020-08-26 NOTE — Progress Notes (Signed)
Chronic Care Management Pharmacy Assistant   Name: Tiffany Hood  MRN: [id number] DOB: [birth year]   Reason for Encounter: Disease State General Call    Recent office visits:  None ID  Recent consult visits:  None ID  Hood visits:  None in previous 6 months  Medications: Outpatient Encounter Medications as of 08/21/2020  Medication Sig Note  . Ascorbic Acid (VITAMIN C) 1000 MG tablet Take 500 mg by mouth daily.   . Biotin w/ Vitamins C & E (HAIR SKIN & NAILS GUMMIES PO) Take 1 each by mouth daily.   . Calcium Carbonate-Vitamin D (CALTRATE 600+D PO) Take 1 capsule by mouth in the morning and at bedtime.   . cycloSPORINE (RESTASIS) 0.05 % ophthalmic emulsion Place 1 drop into both eyes 2 (two) times daily.    . diclofenac Sodium (VOLTAREN) 1 % GEL Apply 2 g topically 4 (four) times daily as needed.   . Ibuprofen-diphenhydrAMINE HCl (ADVIL PM) 200-25 MG CAPS Take 1 capsule by mouth at bedtime as needed.   Marland Kitchen icosapent Ethyl (VASCEPA) 1 g capsule Take 2 capsules (2 g total) by mouth 2 (two) times daily.   . Lancets (ONETOUCH ULTRASOFT) lancets USE AS INSTRUCTED TO CHECK  BLOOD SUGARS DAILY   . losartan (COZAAR) 50 MG tablet Take 1 tablet (50 mg total) by mouth daily.   . Melatonin 5 MG TABS Take 5 mg by mouth daily.   . meloxicam (MOBIC) 7.5 MG tablet TAKE 1 TABLET BY MOUTH  DAILY   . metroNIDAZOLE (METROGEL) 0.75 % gel    . Multiple Vitamins-Minerals (PRESERVISION AREDS PO) Take by mouth.   . ONE TOUCH ULTRA TEST test strip USE TO CHECK BLOOD SUGAR  DAILY   . Polyethyl Glycol-Propyl Glycol 0.4-0.3 % SOLN Place 1 drop into both eyes every morning. 02/02/2018: Pt uses 4-5 times daily  . pravastatin (PRAVACHOL) 40 MG tablet TAKE 1 TABLET BY MOUTH  DAILY   . triamterene-hydrochlorothiazide (MAXZIDE) 75-50 MG tablet TAKE ONE-HALF TABLET BY  MOUTH DAILY    No facility-administered encounter medications on file as of 08/21/2020.    Pharmacist Review  Have you had any problems  recently with your health? Patient states that she is still dealing with pain in her left knee. She states that she has been to the orthopedic twice and they took xrays and found nothing wrong. She states that [name] states there may be a tear. Patient states that she has been dealing with this since the end of April. She states that she has taken prednisone and meloxicam for pain. She states that she continues to wake up at night with knee pain and swelling in her knee and leg. The patient states that she thinks she needs a MRI done just to be sure there is no blood clot. She states that she has another appointment to see the orthopedic [name].  Have you had any problems with your pharmacy? Patient states that she doe not have any problems with getting medications or the cost of medications from the pharmacy  What issues or side effects are you having with your medications? Patient states that she does not have any issues or side effects from medications  What would you like me to pass along to Tiffany Hood, [name] for them to help you with?  Patient states that besides the knee pain and swelling she is doing ok  What can we do to take care of you better? Patient states that if she  has any changes in health she will call Dr. Genia Hood [facility] Clinical Pharmacist Assistant [phone]  Time spent:40

## 2020-08-27 ENCOUNTER — Telehealth: Payer: Self-pay | Admitting: Pharmacist

## 2020-08-27 NOTE — Chronic Care Management (AMB) (Signed)
    Chronic Care Management Pharmacy Assistant   Name: Tiffany Hood  MRN: [id number] DOB: 09-19-1940   Medications: Outpatient Encounter Medications as of 08/27/2020  Medication Sig Note  . Ascorbic Acid (VITAMIN C) 1000 MG tablet Take 500 mg by mouth daily.   . Biotin w/ Vitamins C & E (HAIR SKIN & NAILS GUMMIES PO) Take 1 each by mouth daily.   . Calcium Carbonate-Vitamin D (CALTRATE 600+D PO) Take 1 capsule by mouth in the morning and at bedtime.   . cycloSPORINE (RESTASIS) 0.05 % ophthalmic emulsion Place 1 drop into both eyes 2 (two) times daily.    . diclofenac Sodium (VOLTAREN) 1 % GEL Apply 2 g topically 4 (four) times daily as needed.   . Ibuprofen-diphenhydrAMINE HCl (ADVIL PM) 200-25 MG CAPS Take 1 capsule by mouth at bedtime as needed.   Marland Kitchen icosapent Ethyl (VASCEPA) 1 g capsule Take 2 capsules (2 g total) by mouth 2 (two) times daily.   . Lancets (ONETOUCH ULTRASOFT) lancets USE AS INSTRUCTED TO CHECK  BLOOD SUGARS DAILY   . losartan (COZAAR) 50 MG tablet Take 1 tablet (50 mg total) by mouth daily.   . Melatonin 5 MG TABS Take 5 mg by mouth daily.   . meloxicam (MOBIC) 7.5 MG tablet TAKE 1 TABLET BY MOUTH  DAILY   . metroNIDAZOLE (METROGEL) 0.75 % gel    . Multiple Vitamins-Minerals (PRESERVISION AREDS PO) Take by mouth.   . ONE TOUCH ULTRA TEST test strip USE TO CHECK BLOOD SUGAR  DAILY   . Polyethyl Glycol-Propyl Glycol 0.4-0.3 % SOLN Place 1 drop into both eyes every morning. 02/02/2018: Pt uses 4-5 times daily  . pravastatin (PRAVACHOL) 40 MG tablet TAKE 1 TABLET BY MOUTH  DAILY   . triamterene-hydrochlorothiazide (MAXZIDE) 75-50 MG tablet TAKE ONE-HALF TABLET BY  MOUTH DAILY    No facility-administered encounter medications on file as of 08/27/2020.    Pharmacist Review  A call was made to [name] this morning to check and see how she did over the weekend with her knee pain. Patient stated that the pain in her knee is about the same and that this morning she has  very little swelling. She states that it helps when she keeps her leg elevated but when she gets up and moves around that the swelling is worse. She believes that there really is an issue with her knee and thinks that she needs further testing done. Patient stated that she will be calling Dr. Scarlette Ar office this morning to she if she can get approved for an MRI. She states that she has an appointment With him on 09/02/20 and also and appointment with Dr. Quay Burow coming on 10/18/20.  [place] Pharmacist Assistant [phone]  Time spent:16

## 2020-09-08 DIAGNOSIS — M1712 Unilateral primary osteoarthritis, left knee: Secondary | ICD-10-CM | POA: Diagnosis not present

## 2020-09-08 DIAGNOSIS — M79605 Pain in left leg: Secondary | ICD-10-CM | POA: Diagnosis not present

## 2020-09-15 DIAGNOSIS — M1712 Unilateral primary osteoarthritis, left knee: Secondary | ICD-10-CM | POA: Diagnosis not present

## 2020-09-15 DIAGNOSIS — M79605 Pain in left leg: Secondary | ICD-10-CM | POA: Diagnosis not present

## 2020-10-06 DIAGNOSIS — M25562 Pain in left knee: Secondary | ICD-10-CM | POA: Diagnosis not present

## 2020-10-06 DIAGNOSIS — M79605 Pain in left leg: Secondary | ICD-10-CM | POA: Diagnosis not present

## 2020-10-06 NOTE — Patient Instructions (Addendum)
  Blood work was ordered.     Medications changes include :   none    A referral was ordered for thyroid ultrasound.       Someone from their office will call you to schedule an appointment.    Please followup in 6 months

## 2020-10-06 NOTE — Progress Notes (Addendum)
Subjective:    Patient ID: Tiffany Hood, female    DOB: 09-25-1940, 80 y.o.   MRN: [id number]  HPI The patient is here for follow up of their chronic medical problems, including htn, DM, HLD, OA  She is not exercising regularly.     She has a stress fx in her left knee and is following with [facility].  She wear a brace.  The pain is improving. She is taking gabapentin at night and it has been helping.    Medications and allergies reviewed with patient and updated if appropriate.  Patient Active Problem List   Diagnosis Date Noted   Multiple thyroid nodules 08/12/2016   Diabetes ([place]) 08/08/2015   Spinal stenosis, lumbar region, with neurogenic claudication 02/19/2015   Osteopenia 03/30/2013   Diverticulitis of sigmoid colon s/p sigmoidectomy 10/05/2012   Diverticulosis 10/19/2011   Osteoarthritis 01/28/2010   Hyperlipidemia 12/25/2007   Essential hypertension 02/10/2007   History of cancer of left breast 05/02/2002    Current Outpatient Medications on File Prior to Visit  Medication Sig Dispense Refill   Ascorbic Acid (VITAMIN C) 1000 MG tablet Take 500 mg by mouth daily.     Biotin w/ Vitamins C & E (HAIR SKIN & NAILS GUMMIES PO) Take 1 each by mouth daily.     Calcium Carbonate-Vitamin D (CALTRATE 600+D PO) Take 1 capsule by mouth in the morning and at bedtime.     cycloSPORINE (RESTASIS) 0.05 % ophthalmic emulsion Place 1 drop into both eyes 2 (two) times daily.      cycloSPORINE (RESTASIS) 0.05 % ophthalmic emulsion Apply 1 drop to eye in the morning and at bedtime.     diclofenac Sodium (VOLTAREN) 1 % GEL Apply 2 g topically 4 (four) times daily as needed.     gabapentin (NEURONTIN) 100 MG capsule Take 100 mg by mouth 3 (three) times daily.     Ibuprofen-diphenhydrAMINE HCl (ADVIL PM) 200-25 MG CAPS Take 1 capsule by mouth at bedtime as needed.     icosapent Ethyl (VASCEPA) 1 g capsule Take 2 capsules (2 g total) by mouth 2 (two) times daily. 120 capsule 5    Lancets (ONETOUCH ULTRASOFT) lancets USE AS INSTRUCTED TO CHECK  BLOOD SUGARS DAILY 100 each 3   losartan (COZAAR) 50 MG tablet Take 1 tablet (50 mg total) by mouth daily. 90 tablet 3   Melatonin 5 MG TABS Take 5 mg by mouth daily.     meloxicam (MOBIC) 7.5 MG tablet TAKE 1 TABLET BY MOUTH  DAILY 90 tablet 3   metroNIDAZOLE (METROGEL) 0.75 % gel      Multiple Vitamins-Minerals (PRESERVISION AREDS PO) Take by mouth.     ONE TOUCH ULTRA TEST test strip USE TO CHECK BLOOD SUGAR  DAILY 100 each 3   Polyethyl Glyc-Propyl Glyc PF (SYSTANE HYDRATION PF) 0.4-0.3 % SOLN Apply 1 drop to eye in the morning and at bedtime.     Polyethyl Glycol-Propyl Glycol 0.4-0.3 % SOLN Place 1 drop into both eyes every morning.     pravastatin (PRAVACHOL) 40 MG tablet TAKE 1 TABLET BY MOUTH  DAILY 90 tablet 3   triamterene-hydrochlorothiazide (MAXZIDE) 75-50 MG tablet TAKE ONE-HALF TABLET BY  MOUTH DAILY 45 tablet 3   No current facility-administered medications on file prior to visit.    Past Medical History:  Diagnosis Date   Breast cancer ([place]) left-2004 sx   Diabetes mellitus    TYPE 2   Diverticulitis    09/14/12  Diverticulosis    Heart murmur    History of radiation therapy    History of urinary tract infection    Hyperlipidemia    Hypertension    Osteoporosis    Fosamax Rxed 2/13   Personal history of colonic adenomas 11/15/2012   Personal history of radiation therapy 06/2002   Pneumonia    2013   Seasonal allergies    Tingling    right leg     Past Surgical History:  Procedure Laterality Date   ABDOMINAL HYSTERECTOMY  1965   ovaries remain; for dysfunctional menses; ? endometriosis   ABDOMINAL SURGERY     BREAST BIOPSY Left 04/16/2002   malignant   BREAST LUMPECTOMY Left    colon polyps  2014   Dr Carlean Purl   COLONOSCOPY  2004    Tics   DILATION AND CURETTAGE OF UTERUS     LAPAROSCOPIC SIGMOID COLECTOMY  04/13/2013   DR Cowarts N/A 04/13/2013    Procedure: LAPAROSCOPIC ASSISTED SIGMOID COLECTOMY;  Surgeon: Merrie Roof, MD;  Location: [facility];  Service: General;  Laterality: N/A;   LUMBAR LAMINECTOMY/DECOMPRESSION MICRODISCECTOMY N/A 02/19/2015   Procedure: MICRODISCECTOMY AT L4-L5, CENTRAL DECOMPRESSION L4-L5 FOR SPINAL STENOSIS, FORAMINOTOMY FOR L4 L5 ROOT ON RIGHT;  Surgeon: Latanya Maudlin, MD;  Location: [facility];  Service: Orthopedics;  Laterality: N/A;   removal bone spur Left 2/14   2nd finger   TONSILLECTOMY AND ADENOIDECTOMY      Social History   Socioeconomic History   Marital status: Widowed    Spouse name: Not on file   Number of children: Not on file   Years of education: Not on file   Highest education level: Not on file  Occupational History   Not on file  Tobacco Use   Smoking status: Never   Smokeless tobacco: Never  Vaping Use   Vaping Use: Never used  Substance and Sexual Activity   Alcohol use: No    Alcohol/week: 0.0 standard drinks   Drug use: No   Sexual activity: Not on file  Other Topics Concern   Not on file  Social History Narrative   Exercise: no   Social Determinants of Health   Financial Resource Strain: Low Risk    Difficulty of Paying Living Expenses: Not hard at all  Food Insecurity: No Food Insecurity   Worried About Charity fundraiser in the Last Year: Never true   [place] in the Last Year: Never true  Transportation Needs: No Transportation Needs   Lack of Transportation (Medical): No   Lack of Transportation (Non-Medical): No  Physical Activity: Sufficiently Active   Days of Exercise per Week: 5 days   Minutes of Exercise per Session: 30 min  Stress: No Stress Concern Present   Feeling of Stress : Not at all  Social Connections: Moderately Integrated   Frequency of Communication with Friends and Family: More than three times a week   Frequency of Social Gatherings with Friends and Family: More than three times a week   Attends Religious Services: More than 4  times per year   Active Member of Clubs or Organizations: No   Attends Music therapist: More than 4 times per year   Marital Status: Widowed    Family History  Problem Relation Age of Onset   Stroke Mother 60   Hypertension Mother    Lung cancer Father        smoker  Cancer - Lung Father    Hypertension Brother    Heart disease Brother 32   Stroke Brother 52   Hypertension Brother    Hypertension Sister    Diabetes Neg Hx    Colon cancer Neg Hx    Esophageal cancer Neg Hx    Rectal cancer Neg Hx    Stomach cancer Neg Hx    Colon polyps Neg Hx     Review of Systems  Constitutional:  Negative for chills and fever.  Respiratory:  Negative for cough, shortness of breath and wheezing.   Cardiovascular:  Positive for leg swelling (mild). Negative for chest pain and palpitations.  Neurological:  Positive for headaches (rare). Negative for dizziness and light-headedness.      Objective:   Vitals:   10/07/20 1117  BP: 138/80  Pulse: 74  Temp: 98.7 F (37.1 C)  SpO2: 95%   BP Readings from Last 3 Encounters:  10/07/20 138/80  07/21/20 140/82  04/10/20 (!) 148/82   Wt Readings from Last 3 Encounters:  10/07/20 163 lb (73.9 kg)  07/21/20 161 lb 12.8 oz (73.4 kg)  04/10/20 159 lb (72.1 kg)   Body mass index is 28.87 kg/m.   Physical Exam    Constitutional: Appears well-developed and well-nourished. No distress.  HENT:  Head: Normocephalic and atraumatic.  Neck: Neck supple. No tracheal deviation present. No thyromegaly present.  No cervical lymphadenopathy Cardiovascular: Normal rate, regular rhythm and normal heart sounds.   No murmur heard. No carotid bruit .  No edema Pulmonary/Chest: Effort normal and breath sounds normal. No respiratory distress. No has no wheezes. No rales.  Skin: Skin is warm and dry. Not diaphoretic.  Psychiatric: Normal mood and affect. Behavior is normal.   Diabetic Foot Exam - Simple   Simple Foot Form Diabetic Foot  exam was performed with the following findings: Yes 10/07/2020  2:08 PM  Visual Inspection No deformities, no ulcerations, no other skin breakdown bilaterally: Yes Sensation Testing Intact to touch and monofilament testing bilaterally: Yes Pulse Check Posterior Tibialis and Dorsalis pulse intact bilaterally: Yes Comments       Assessment & Plan:    See Problem List for Assessment and Plan of chronic medical problems.    This visit occurred during the SARS-CoV-2 public health emergency.  Safety protocols were in place, including screening questions prior to the visit, additional usage of staff PPE, and extensive cleaning of exam room while observing appropriate contact time as indicated for disinfecting solutions.

## 2020-10-07 ENCOUNTER — Ambulatory Visit (INDEPENDENT_AMBULATORY_CARE_PROVIDER_SITE_OTHER): Payer: Medicare Other | Admitting: Internal Medicine

## 2020-10-07 ENCOUNTER — Encounter: Payer: Self-pay | Admitting: Internal Medicine

## 2020-10-07 ENCOUNTER — Other Ambulatory Visit: Payer: Self-pay

## 2020-10-07 VITALS — BP 138/80 | HR 74 | Temp 98.7°F | Wt 163.0 lb

## 2020-10-07 DIAGNOSIS — I10 Essential (primary) hypertension: Secondary | ICD-10-CM

## 2020-10-07 DIAGNOSIS — E119 Type 2 diabetes mellitus without complications: Secondary | ICD-10-CM | POA: Diagnosis not present

## 2020-10-07 DIAGNOSIS — M1711 Unilateral primary osteoarthritis, right knee: Secondary | ICD-10-CM

## 2020-10-07 DIAGNOSIS — E7849 Other hyperlipidemia: Secondary | ICD-10-CM | POA: Diagnosis not present

## 2020-10-07 DIAGNOSIS — E042 Nontoxic multinodular goiter: Secondary | ICD-10-CM

## 2020-10-07 LAB — LIPID PANEL
Cholesterol: 212 mg/dL — ABNORMAL HIGH (ref 0–200)
HDL: 67.6 mg/dL (ref >39.00)
NonHDL: 144.58
Total CHOL/HDL Ratio: 3
Triglycerides: 274 mg/dL — ABNORMAL HIGH (ref 0.0–149.0)
VLDL: 54.8 mg/dL — ABNORMAL HIGH (ref 0.0–40.0)

## 2020-10-07 LAB — COMPREHENSIVE METABOLIC PANEL
ALT: 17 U/L (ref 0–35)
AST: 18 U/L (ref 0–37)
Albumin: 4.3 g/dL (ref 3.5–5.2)
Alkaline Phosphatase: 61 U/L (ref 39–117)
BUN: 23 mg/dL (ref 6–23)
CO2: 29 mEq/L (ref 19–32)
Calcium: 10.4 mg/dL (ref 8.4–10.5)
Chloride: 99 mEq/L (ref 96–112)
Creatinine, Ser: 0.63 mg/dL (ref 0.40–1.20)
GFR: 84.87 mL/min (ref >60.00)
Glucose, Bld: 87 mg/dL (ref 70–99)
Potassium: 4 mEq/L (ref 3.5–5.1)
Sodium: 137 mEq/L (ref 135–145)
Total Bilirubin: 0.4 mg/dL (ref 0.2–1.2)
Total Protein: 7.1 g/dL (ref 6.0–8.3)

## 2020-10-07 LAB — LDL CHOLESTEROL, DIRECT: Direct LDL: 102 mg/dL

## 2020-10-07 LAB — HEMOGLOBIN A1C: Hgb A1c MFr Bld: 6.6 % — ABNORMAL HIGH (ref 4.6–6.5)

## 2020-10-07 LAB — TSH: TSH: 1.61 u[IU]/mL (ref 0.35–5.50)

## 2020-10-07 NOTE — Assessment & Plan Note (Signed)
Chronic Check lipid panel  Continue pravastatin 40 mg daily Regular exercise and healthy diet encouraged  

## 2020-10-07 NOTE — Assessment & Plan Note (Signed)
Chronic Diet controlled She feels she has been compliant with a diabetic diet Not able to exercise much because of knee injury Check A1c

## 2020-10-07 NOTE — Assessment & Plan Note (Addendum)
Chronic BP adequately controlled Continue losartan 50 mg qd, maxizide 75-50 mg qd cmp

## 2020-10-07 NOTE — Assessment & Plan Note (Signed)
Chronic Multiple joints Continue meloxicam 7.5 mg daily Continue Tylenol, Voltaren gel as needed

## 2020-10-07 NOTE — Assessment & Plan Note (Signed)
Chronic Clinically euthyroid Check TSH Ultrasound of thyroid to evaluate nodules ordered

## 2020-10-16 ENCOUNTER — Other Ambulatory Visit: Payer: Self-pay | Admitting: Internal Medicine

## 2020-10-20 ENCOUNTER — Other Ambulatory Visit: Payer: Self-pay

## 2020-10-20 ENCOUNTER — Ambulatory Visit (INDEPENDENT_AMBULATORY_CARE_PROVIDER_SITE_OTHER): Payer: Medicare Other | Admitting: Pharmacist

## 2020-10-20 DIAGNOSIS — I10 Essential (primary) hypertension: Secondary | ICD-10-CM

## 2020-10-20 DIAGNOSIS — E7849 Other hyperlipidemia: Secondary | ICD-10-CM | POA: Diagnosis not present

## 2020-10-20 DIAGNOSIS — E119 Type 2 diabetes mellitus without complications: Secondary | ICD-10-CM | POA: Diagnosis not present

## 2020-10-20 DIAGNOSIS — M1711 Unilateral primary osteoarthritis, right knee: Secondary | ICD-10-CM

## 2020-10-20 NOTE — Patient Instructions (Signed)
Visit Information  Phone number for Pharmacist: [phone]   Goals Addressed             This Visit's Progress    Manage My Medicine       Timeframe:  Long-Range Goal Priority:  Medium Start Date:   10/20/20                          Expected End Date:    10/20/21                   Follow Up Date Jan 2023   - call for medicine refill 2 or 3 days before it runs out - call if I am sick and can't take my medicine - keep a list of all the medicines I take; vitamins and herbals too -Get Shingrix vaccine at local pharmacy    Why is this important?   These steps will help you keep on track with your medicines.   Notes:         Patient verbalizes understanding of instructions provided today and agrees to view in [place].  Telephone follow up appointment with pharmacy team member scheduled for: 1 year  Charlene Brooke, PharmD, [place], [name] Clinical Pharmacist [facility] [phone]

## 2020-10-20 NOTE — Progress Notes (Signed)
Chronic Care Management Pharmacy Note  10/20/2020 Name:  Tiffany Hood MRN:  [id number] DOB:  12-05-1940  Summary: -Pt reports adherence to medications as prescribed -Pt reports Vascepa is expensive  Recommendations/Changes made from today's visit: -Will pursue tier exception for Vascepa (currently Tier 4) -Advised to get Shingrix vaccine at local pharmacy   Subjective: Tiffany Hood is an 80 y.o. year old female who is a primary patient of Burns, Claudina Lick, MD.  The CCM team was consulted for assistance with disease management and care coordination needs.    Engaged with patient by telephone for follow up visit in response to provider referral for pharmacy case management and/or care coordination services.   Consent to Services:  The patient was given information about Chronic Care Management services, agreed to services, and gave verbal consent prior to initiation of services.  Please see initial visit note for detailed documentation.   Patient Care Team: Binnie Rail, MD as PCP - General (Internal Medicine) Charlton Haws, [facility] as Pharmacist (Pharmacist) Delice Bison Charlestine Massed, NP as Nurse Practitioner (Hematology and Oncology)   Pt moved to [place] in the 1960s, from [place]. Husband died 06-25-01 of DM, son Comal She enjoys gardening, stays active in warmer months. Due to short gut issues, can't go on long trips.  Recent office visits: 10/07/20 Dr Quay Burow OV: chronic f/u; Trig high but improved; ordered thyroid US. no med changes.  04/10/20 Dr Quay Burow OV: chronic f/u. TRIG too high, started Vascepa.  Recent consult visits: June 2022 - [facility]. Stress fracture in L knee  Hospital visits: None in previous 6 months  Objective:  Lab Results  Component Value Date   CREATININE 0.63 10/07/2020   BUN 23 10/07/2020   GFR 84.87 10/07/2020   GFRNONAA >60 02/18/2015   GFRAA >60 02/18/2015   NA 137 10/07/2020   K 4.0 10/07/2020    CALCIUM 10.4 10/07/2020   CO2 29 10/07/2020   GLUCOSE 87 10/07/2020    Lab Results  Component Value Date/Time   HGBA1C 6.6 (H) 10/07/2020 11:49 AM   HGBA1C 6.6 (H) 04/10/2020 10:33 AM   GFR 84.87 10/07/2020 11:49 AM   GFR 85.16 04/10/2020 10:33 AM   MICROALBUR 1.0 08/14/2015 08:44 AM   MICROALBUR 1.3 05/10/2014 09:34 AM    Last diabetic Eye exam:  Lab Results  Component Value Date/Time   HMDIABEYEEXA No Retinopathy 12/04/2019 12:00 AM    Last diabetic Foot exam: No results found for: HMDIABFOOTEX   Lab Results  Component Value Date   CHOL 212 (H) 10/07/2020   HDL 67.60 10/07/2020   [place]  10/09/2019     Comment:     . LDL cholesterol not calculated. Triglyceride levels greater than 400 mg/dL invalidate calculated LDL results. . Reference range: <100 . Desirable range <100 mg/dL for primary prevention;   <70 mg/dL for patients with CHD or diabetic patients  with > or = 2 CHD risk factors. Marland Kitchen LDL-C is now calculated using the Martin-Hopkins  calculation, which is a validated novel method providing  better accuracy than the Friedewald equation in the  estimation of LDL-C.  Cresenciano Genre et al. Annamaria Helling. 2683;419(62): 2061-2068  (http://education.[facility].com/faq/FAQ164)    LDLDIRECT 102.0 10/07/2020   TRIG 274.0 (H) 10/07/2020   CHOLHDL 3 10/07/2020    Hepatic Function Latest Ref Rng & Units 10/07/2020 04/10/2020 10/09/2019  Total Protein 6.0 - 8.3 g/dL 7.1 7.4 7.1  Albumin 3.5 - 5.2 g/dL 4.3 4.4 -  AST 0 - 37 U/L '18 16 18  ' ALT 0 - 35 U/L '17 16 17  ' Alk Phosphatase 39 - 117 U/L 61 63 -  Total Bilirubin 0.2 - 1.2 mg/dL 0.4 0.4 0.4  Bilirubin, Direct 0.0 - 0.3 mg/dL - - -    Lab Results  Component Value Date/Time   TSH 1.61 10/07/2020 11:49 AM   TSH 1.41 10/09/2019 11:23 AM    CBC Latest Ref Rng & Units 10/09/2019 10/05/2018 08/11/2017  WBC 3.8 - 10.8 Thousand/uL 7.2 7.9 7.6  Hemoglobin 11.7 - 15.5 g/dL 13.8 13.7 13.8  Hematocrit 35.0 - 45.0 % 41.7 41.1 40.7   Platelets 140 - 400 Thousand/uL 231 223.0 234.0    Lab Results  Component Value Date/Time   VD25OH 36.82 08/07/2014 04:35 PM   VD25OH 75 10/30/2012 01:26 PM   VD25OH 57 09/16/2011 11:07 AM    Clinical ASCVD: No  The 10-year ASCVD risk score Mikey Bussing DC Jr., et al., 2013) is: 53.7%   Values used to calculate the score:     Age: 54 years     Sex: Female     Is Non-Hispanic African American: No     Diabetic: Yes     Tobacco smoker: No     Systolic Blood Pressure: 672 mmHg     Is BP treated: Yes     HDL Cholesterol: 67.6 mg/dL     Total Cholesterol: 212 mg/dL    Depression screen [facility] 2/9 07/21/2020 10/11/2019 10/05/2018  Decreased Interest 0 0 0  Down, Depressed, Hopeless 0 0 0  PHQ - 2 Score 0 0 0  Some recent data might be hidden     Social History   Tobacco Use  Smoking Status Never  Smokeless Tobacco Never   BP Readings from Last 3 Encounters:  10/07/20 138/80  07/21/20 140/82  04/10/20 (!) 148/82   Pulse Readings from Last 3 Encounters:  10/07/20 74  07/21/20 91  04/10/20 96   Wt Readings from Last 3 Encounters:  10/07/20 163 lb (73.9 kg)  07/21/20 161 lb 12.8 oz (73.4 kg)  04/10/20 159 lb (72.1 kg)   BMI Readings from Last 3 Encounters:  10/07/20 28.87 kg/m  07/21/20 28.66 kg/m  04/10/20 28.62 kg/m    Assessment/Interventions: Review of patient past medical history, allergies, medications, health status, including review of consultants reports, laboratory and other test data, was performed as part of comprehensive evaluation and provision of chronic care management services.   SDOH:  (Social Determinants of Health) assessments and interventions performed: Yes  SDOH Screenings   Alcohol Screen: Low Risk    Last Alcohol Screening Score (AUDIT): 0  Depression (PHQ2-9): Low Risk    PHQ-2 Score: 0  Financial Resource Strain: Low Risk    Difficulty of Paying Living Expenses: Not hard at all  Food Insecurity: No Food Insecurity   Worried About Paediatric nurse in the Last Year: Never true   Ran Out of Food in the Last Year: Never true  Housing: Low Risk    Last Housing Risk Score: 0  Physical Activity: Sufficiently Active   Days of Exercise per Week: 5 days   Minutes of Exercise per Session: 30 min  Social Connections: Moderately Integrated   Frequency of Communication with Friends and Family: More than three times a week   Frequency of Social Gatherings with Friends and Family: More than three times a week   Attends Religious Services: More than 4 times per year   Active  Member of Clubs or Organizations: No   Attends Music therapist: More than 4 times per year   Marital Status: Widowed  Stress: No Stress Concern Present   Feeling of Stress : Not at all  Tobacco Use: Low Risk    Smoking Tobacco Use: Never   Smokeless Tobacco Use: Never  Transportation Needs: No Transportation Needs   Lack of Transportation (Medical): No   Lack of Transportation (Non-Medical): No    CCM Care Plan  Allergies  Allergen Reactions   Celestone [Betamethasone] Nausea Only    Sweated; face and chest with redness for several days     Medications Reviewed Today     Reviewed by Charlton Haws, [facility] (Pharmacist) on 10/20/20 at [place] List Status: <None>   Medication Order Taking? Sig Documenting Provider Last Dose Status Informant  Ascorbic Acid (VITAMIN C) 1000 MG tablet [id number] Yes Take 500 mg by mouth daily. [provider] Taking Active   Biotin w/ Vitamins C & E (HAIR SKIN & NAILS GUMMIES PO) [id number] Yes Take 1 each by mouth daily. [provider] Taking Active   Calcium Carbonate-Vitamin D (CALTRATE 600+D PO) [id number] Yes Take 1 capsule by mouth in the morning and at bedtime. [provider] Taking Active Self           Med Note Luna Glasgow Jun 18, 2019 11:19 AM)    cycloSPORINE (RESTASIS) 0.05 % ophthalmic emulsion [id number] Yes Apply 1 drop to eye in the morning and at  bedtime. [provider] Taking Active   diclofenac Sodium (VOLTAREN) 1 % GEL [id number] Yes Apply 2 g topically 4 (four) times daily as needed. [provider] Taking Active   gabapentin (NEURONTIN) 100 MG capsule [id number] Yes Take 100 mg by mouth 3 (three) times daily. [provider] Taking Active   icosapent Ethyl (VASCEPA) 1 g capsule [id number] Yes Take 2 capsules (2 g total) by mouth 2 (two) times daily. Binnie Rail, MD Taking Active   Lancets [facility] ULTRASOFT) lancets [id number] Yes USE AS INSTRUCTED TO CHECK  BLOOD SUGARS DAILY Quay Burow Claudina Lick, MD Taking Active   losartan (COZAAR) 50 MG tablet [id number] Yes Take 1 tablet (50 mg total) by mouth daily. Binnie Rail, MD Taking Active   Melatonin 5 MG TABS [id number] Yes Take 5 mg by mouth daily. [provider] Taking Active   meloxicam (MOBIC) 7.5 MG tablet [id number] Yes TAKE 1 TABLET BY MOUTH  DAILY Burns, Claudina Lick, MD Taking Active   Multiple Vitamins-Minerals (PRESERVISION AREDS PO) [id number] Yes Take by mouth. [provider] Taking Active   ONE TOUCH ULTRA TEST test strip [id number] Yes USE TO CHECK BLOOD SUGAR  DAILY Burns, Claudina Lick, MD Taking Active   Polyethyl Glycol-Propyl Glycol 0.4-0.3 % SOLN [id number] Yes Place 1 drop into both eyes every morning. [provider] Taking Active Self           Med Note Juliane Poot   Thu Feb 02, 2018 10:19 AM) Pt uses 4-5 times daily  pravastatin (PRAVACHOL) 40 MG tablet [id number] Yes TAKE 1 TABLET BY MOUTH  DAILY Binnie Rail, MD Taking Active   triamterene-hydrochlorothiazide (MAXZIDE) 75-50 MG tablet [id number] Yes TAKE ONE-HALF TABLET BY  MOUTH DAILY Binnie Rail, MD Taking Active             Patient Active Problem List   Diagnosis Date Noted   Multiple thyroid nodules 08/12/2016   Diabetes (  [place]) 08/08/2015   Spinal stenosis, lumbar region, with neurogenic claudication 02/19/2015   Osteopenia 03/30/2013   Diverticulitis of  sigmoid colon s/p sigmoidectomy 10/05/2012   Diverticulosis 10/19/2011   Osteoarthritis 01/28/2010   Hyperlipidemia 12/25/2007   Essential hypertension 02/10/2007   History of cancer of left breast 05/02/2002    Immunization History  Administered Date(s) Administered   Fluad Quad(high Dose 65+) 12/15/2018   Influenza Whole 02/06/2007, 12/25/2007   Influenza, High Dose Seasonal PF 01/02/2013, 01/02/2014, 12/13/2016, 12/07/2017, 12/25/2019   Influenza,inj,quad, With Preservative 11/27/2016   Influenza-Unspecified 12/26/2014, 12/19/2015, 12/07/2017   PFIZER Comirnaty(Gray Top)Covid-19 Tri-Sucrose Vaccine 07/30/2020   PFIZER(Purple Top)SARS-COV-2 Vaccination 05/04/2019, 05/25/2019, 01/07/2020   Pneumococcal Conjugate-13 09/26/2014   Pneumococcal Polysaccharide-23 03/30/2013   Td 03/28/2013   Zoster, Live 12/09/2011    Conditions to be addressed/monitored:  Hypertension, Hyperlipidemia, Diabetes, and Osteoarthritis  Care Plan : [place]  Updates made by Charlton Haws, [place] since 10/20/2020 12:00 AM     Problem: Hypertension, Hyperlipidemia, Diabetes, and Osteoarthritis   Priority: High     Long-Range Goal: Disease management   Start Date: 10/20/2020  Expected End Date: 10/20/2021  This Visit's Progress: On track  Priority: High  Note:   Current Barriers:  Unable to independently monitor therapeutic efficacy  Pharmacist Clinical Goal(s):  Patient will achieve adherence to monitoring guidelines and medication adherence to achieve therapeutic efficacy through collaboration with PharmD and provider.   Interventions: 1:1 collaboration with Binnie Rail, MD regarding development and update of comprehensive plan of care as evidenced by provider attestation and co-signature Inter-disciplinary care team collaboration (see longitudinal plan of care) Comprehensive medication review performed; medication list updated in electronic medical record  Diabetes    A1c  goal < 7% Diet-controlled, no medications indicated.  Checking BG: Weekly Recent FBG Readings: 87 - 120   We discussed: A1c is stable since last check   Plan: Continue control with diet and exercise    Hypertension    BP goal < 140/90 Patient checks BP at home infrequently Patient home BP readings are ranging: n/a   Patient has failed these meds in the past: n/a Patient is currently controlled on the following medications:  Losartan 50 mg daily Triamterene/HCTZ 75-50 mg 1/2 tab daily   We discussed: pt reports compliance with medications; BP was at goal in recent office visit  Plan: Continue current medications and control with diet and exercise    Hyperlipidemia    LDL goal < 100  Patient has failed these meds in past: n/a Patient is currently controlled on the following medications: pravastatin 40 mg daily  Vascepa 1 g - 2 cap BID   We discussed: pt reports compliance with medications; LDL is at goal and triglycerides improved from 418 to 274 after starting Vascepa;    Plan Continue current medications and control with diet and exercise  Pursue tier exception for Vascepa   Osteoarthritis    Stress fracture on L knee. Wearing leg brace.  Patient has failed these meds in past: n/a Patient is currently controlled on the following medications: meloxicam 7.5 mg AM OTC Voltaren gel Gabapentin 100 mg HS   We discussed: Pt reports stress fracture is slowly improving with rest and brace; advised against using OTC pain relievers other than Tylenol   Plan: Continue current medications    Vaccines    Reviewed and discussed patient's vaccination history.   Pt is due for Shingrix - she reports it is "on her to-do list" and  she plans to get it soon   Plan: Recommend Shingrix vaccine at local pharmacy    Patient Goals/Self-Care Activities Patient will:  - take medications as prescribed focus on medication adherence by routine -Get Shingrix vaccine at local  pharmacy       Medication Assistance:  [place] - pursuing tier exception. Expected approval date 10/26/20  Compliance/Adherence/Medication fill history: Care Gaps: Shingrix  Star-Rating Drugs: Losartan - LF 09/11/20 x 90 ds Pravastatin - LF 09/11/20 x 90 ds  Patient's preferred pharmacy is:  [facility] - [place] [place] [address] Phone: [phone] Fax: [facility] [phone] - [place], [place] - [address] [place] [address] Phone: [phone] Fax: [phone]  Does not use pill box - separates by AM and PM Pt endorses 100% compliance   We discussed:  Pt is managing meds well on her own, very rarely misses doses, re-orders meds online through mail order and denies issues. She currently gets all generics for $0. Patient decided to: Continue current medication management strategy  Care Plan and Follow Up Patient Decision:  Patient agrees to Care Plan and Follow-up.  Plan: Telephone follow up appointment with care management team member scheduled for:  1 year  Charlene Brooke, PharmD, [place], [name] Clinical Pharmacist [facility] at [facility] [phone]

## 2020-10-22 ENCOUNTER — Other Ambulatory Visit: Payer: Self-pay | Admitting: Internal Medicine

## 2020-10-22 DIAGNOSIS — Z1231 Encounter for screening mammogram for malignant neoplasm of breast: Secondary | ICD-10-CM

## 2020-10-27 DIAGNOSIS — M25562 Pain in left knee: Secondary | ICD-10-CM | POA: Diagnosis not present

## 2020-10-27 DIAGNOSIS — M79605 Pain in left leg: Secondary | ICD-10-CM | POA: Diagnosis not present

## 2020-10-31 ENCOUNTER — Ambulatory Visit
Admission: RE | Admit: 2020-10-31 | Discharge: 2020-10-31 | Disposition: A | Discharge status: Home / Self Care | Payer: Medicare Other | Source: Ambulatory Visit | Attending: Internal Medicine | Admitting: Internal Medicine

## 2020-10-31 DIAGNOSIS — E042 Nontoxic multinodular goiter: Secondary | ICD-10-CM | POA: Diagnosis not present

## 2020-11-17 ENCOUNTER — Encounter: Payer: Self-pay | Admitting: Medical

## 2020-11-17 ENCOUNTER — Telehealth (INDEPENDENT_AMBULATORY_CARE_PROVIDER_SITE_OTHER): Payer: Medicare Other | Admitting: Medical

## 2020-11-17 DIAGNOSIS — R059 Cough, unspecified: Secondary | ICD-10-CM

## 2020-11-17 DIAGNOSIS — J302 Other seasonal allergic rhinitis: Secondary | ICD-10-CM

## 2020-11-17 MED ORDER — AZITHROMYCIN 250 MG PO TABS: ORAL_TABLET | ORAL | 0 refills | Status: AC

## 2020-11-17 MED ORDER — BENZONATATE 100 MG PO CAPS: 100.0000 mg | ORAL_CAPSULE | Freq: Three times a day (TID) | ORAL | 0 refills | Status: DC | PRN

## 2020-11-17 NOTE — Progress Notes (Signed)
   Subjective:    Patient ID: Tiffany Hood, female    DOB: [birth year], 80 y.o.   MRN: [id number]  HPI Virtual Visit via Telephone Note  I connected with [name] on 11/17/20 at  1:40 PM EDT by telephone and verified that I am speaking with the correct person using two identifiers.  Location: Patient: home Provider: office   I discussed the limitations, risks, security and privacy concerns of performing an evaluation and management service by telephone and the availability of in person appointments. I also discussed with the patient that there may be a patient responsible charge related to this service. The patient expressed understanding and agreed to proceed.   History of Present Illness:   Last Tuesday afternoon states felt bad. She states has some mostly dry cough(rare productive cough at times.). Pt states she has taken on covid today and was negative.    No wheezing or shortness of breath.  Pt has been taking it easy.   Some years gets allergies in late summer.  Pt has been vaccinated 4 covid vaccines.   Pt tooks some allegra. Some coughing at night.   Pt does not have 02 sat monitor at home.  No severe nasal congestion.   Observations/Objective:  General-no acute distress, pleasant, oriented. Lungs- on inspection lungs appear unlabored. Neck- no tracheal deviation or jvd on inspection. Neuro- gross motor function appears intact.   Assessment and Plan: Minimal dry cough over the past week with intermittent production.  COVID test negative and vaccinated x4 against COVID.  Some mild symptom improvement with Allegra.  Historically some allergic type symptoms in the summer.  Presently advised to continue Allegra and adding benzonatate cough tablets.  His cough becomes more productive or worsening chest congestion then advise adding azithromycin.  Also placing chest x-ray order if chest congestion will recur/worsen.   In office visit possible signs and  symptoms worsen or change.  Notify us if that would occur.  Follow-up in 7 days or sooner if needed.  Mackie Pai, PA-C   Follow Up Instructions:    I discussed the assessment and treatment plan with the patient. The patient was provided an opportunity to ask questions and all were answered. The patient agreed with the plan and demonstrated an understanding of the instructions.   The patient was advised to call back or seek an in-person evaluation if the symptoms worsen or if the condition fails to improve as anticipated.  Time spent with patient today was 25 minutes which consisted of chart revdiew, discussing diagnosis, work up treatment and documentation.    Mackie Pai, PA-C   History of Present Illness:    Observations/Objective:   Assessment and Plan:   Follow Up Instructions:    I discussed the assessment and treatment plan with the patient. The patient was provided an opportunity to ask questions and all were answered. The patient agreed with the plan and demonstrated an understanding of the instructions.   The patient was advised to call back or seek an in-person evaluation if the symptoms worsen or if the condition fails to improve as anticipated.     Mackie Pai, PA-C    Review of Systems     Objective:   Physical Exam        Assessment & Plan:

## 2020-11-17 NOTE — Patient Instructions (Signed)
Minimal dry cough over the past week with intermittent production.  COVID test negative and vaccinated x4 against COVID.  Some mild symptom improvement with Allegra.  Historically some allergic type symptoms in the summer.  Presently advised to continue Allegra and adding benzonatate cough tablets.  His cough becomes more productive or worsening chest congestion then advise adding azithromycin.  Also placing chest x-ray order if chest congestion will recur/worsen.   In office visit possible signs and symptoms worsen or change.  Notify us if that would occur.  Follow-up in 7 days or sooner if needed.

## 2020-12-09 DIAGNOSIS — E119 Type 2 diabetes mellitus without complications: Secondary | ICD-10-CM | POA: Diagnosis not present

## 2020-12-09 DIAGNOSIS — H401112 Primary open-angle glaucoma, right eye, moderate stage: Secondary | ICD-10-CM | POA: Diagnosis not present

## 2020-12-09 DIAGNOSIS — H2513 Age-related nuclear cataract, bilateral: Secondary | ICD-10-CM | POA: Diagnosis not present

## 2020-12-09 DIAGNOSIS — H0102A Squamous blepharitis right eye, upper and lower eyelids: Secondary | ICD-10-CM | POA: Diagnosis not present

## 2020-12-09 DIAGNOSIS — H0102B Squamous blepharitis left eye, upper and lower eyelids: Secondary | ICD-10-CM | POA: Diagnosis not present

## 2020-12-09 DIAGNOSIS — Z9889 Other specified postprocedural states: Secondary | ICD-10-CM | POA: Diagnosis not present

## 2020-12-09 DIAGNOSIS — H401121 Primary open-angle glaucoma, left eye, mild stage: Secondary | ICD-10-CM | POA: Diagnosis not present

## 2020-12-09 DIAGNOSIS — H16223 Keratoconjunctivitis sicca, not specified as Sjogren's, bilateral: Secondary | ICD-10-CM | POA: Diagnosis not present

## 2020-12-09 LAB — HM DIABETES EYE EXAM

## 2020-12-11 ENCOUNTER — Ambulatory Visit
Admission: RE | Admit: 2020-12-11 | Discharge: 2020-12-11 | Disposition: A | Discharge status: Home / Self Care | Payer: Medicare Other | Source: Ambulatory Visit | Attending: Internal Medicine | Admitting: Internal Medicine

## 2020-12-11 ENCOUNTER — Other Ambulatory Visit: Payer: Self-pay

## 2020-12-11 DIAGNOSIS — Z1231 Encounter for screening mammogram for malignant neoplasm of breast: Secondary | ICD-10-CM

## 2020-12-18 DIAGNOSIS — M792 Neuralgia and neuritis, unspecified: Secondary | ICD-10-CM | POA: Diagnosis not present

## 2020-12-18 DIAGNOSIS — M25562 Pain in left knee: Secondary | ICD-10-CM | POA: Diagnosis not present

## 2020-12-18 DIAGNOSIS — M79605 Pain in left leg: Secondary | ICD-10-CM | POA: Diagnosis not present

## 2020-12-18 DIAGNOSIS — G8929 Other chronic pain: Secondary | ICD-10-CM | POA: Insufficient documentation

## 2021-01-01 DIAGNOSIS — M79605 Pain in left leg: Secondary | ICD-10-CM | POA: Diagnosis not present

## 2021-01-02 ENCOUNTER — Other Ambulatory Visit: Payer: Self-pay | Admitting: Internal Medicine

## 2021-01-06 DIAGNOSIS — M79605 Pain in left leg: Secondary | ICD-10-CM | POA: Diagnosis not present

## 2021-01-14 ENCOUNTER — Telehealth: Payer: Self-pay | Admitting: Pharmacist

## 2021-01-14 NOTE — Progress Notes (Addendum)
Chronic Care Management Pharmacy Assistant   Name: Tiffany Hood  MRN: [id number] DOB: June 18, 1940  Reason for Encounter: Disease State-Hypertension   Recent office visits:  None noted  Recent consult visits:  11/17/20 [facility] ([facility]) - Seasonal Allergic rhinitis. Start Azithromycin 250 mg & Benzonatate 100 mg.   Hospital visits:  None in previous 6 months  Medications: Outpatient Encounter Medications as of 01/14/2021  Medication Sig Note   Ascorbic Acid (VITAMIN C) 1000 MG tablet Take 500 mg by mouth daily.    benzonatate (TESSALON) 100 MG capsule Take 1 capsule (100 mg total) by mouth 3 (three) times daily as needed for cough.    Biotin w/ Vitamins C & E (HAIR SKIN & NAILS GUMMIES PO) Take 1 each by mouth daily.    Calcium Carbonate-Vitamin D (CALTRATE 600+D PO) Take 1 capsule by mouth in the morning and at bedtime.    cycloSPORINE (RESTASIS) 0.05 % ophthalmic emulsion Apply 1 drop to eye in the morning and at bedtime.    diclofenac Sodium (VOLTAREN) 1 % GEL Apply 2 g topically 4 (four) times daily as needed.    gabapentin (NEURONTIN) 100 MG capsule Take 100 mg by mouth 3 (three) times daily.    icosapent Ethyl (VASCEPA) 1 g capsule Take 2 capsules (2 g total) by mouth 2 (two) times daily.    Lancets (ONETOUCH ULTRASOFT) lancets USE AS INSTRUCTED TO CHECK  BLOOD SUGARS DAILY    losartan (COZAAR) 50 MG tablet Take 1 tablet (50 mg total) by mouth daily.    Melatonin 5 MG TABS Take 5 mg by mouth daily.    meloxicam (MOBIC) 7.5 MG tablet TAKE 1 TABLET BY MOUTH  DAILY    Multiple Vitamins-Minerals (PRESERVISION AREDS PO) Take by mouth.    ONE TOUCH ULTRA TEST test strip USE TO CHECK BLOOD SUGAR  DAILY    Polyethyl Glycol-Propyl Glycol 0.4-0.3 % SOLN Place 1 drop into both eyes every morning. 02/02/2018: Pt uses 4-5 times daily   pravastatin (PRAVACHOL) 40 MG tablet TAKE 1 TABLET BY MOUTH  DAILY    triamterene-hydrochlorothiazide (MAXZIDE) 75-50 MG tablet TAKE ONE-HALF  TABLET BY  MOUTH DAILY    No facility-administered encounter medications on file as of 01/14/2021.   Reviewed chart prior to disease state call. Spoke with patient regarding BP  Recent Office Vitals: BP Readings from Last 3 Encounters:  10/07/20 138/80  07/21/20 140/82  04/10/20 (!) 148/82   Pulse Readings from Last 3 Encounters:  10/07/20 74  07/21/20 91  04/10/20 96    Wt Readings from Last 3 Encounters:  10/07/20 163 lb (73.9 kg)  07/21/20 161 lb 12.8 oz (73.4 kg)  04/10/20 159 lb (72.1 kg)     Kidney Function Lab Results  Component Value Date/Time   CREATININE 0.63 10/07/2020 11:49 AM   CREATININE 0.63 04/10/2020 10:33 AM   CREATININE 0.64 10/09/2019 11:23 AM   CREATININE 0.8 01/30/2015 02:06 PM   CREATININE 0.8 01/29/2014 03:18 PM   GFR 84.87 10/07/2020 11:49 AM   GFRNONAA >60 02/18/2015 11:30 AM   GFRAA >60 02/18/2015 11:30 AM    BMP Latest Ref Rng & Units 10/07/2020 04/10/2020 10/09/2019  Glucose 70 - 99 mg/dL 87 105(H) 80  BUN 6 - 23 mg/dL 23 25(H) 19  Creatinine 0.40 - 1.20 mg/dL 0.63 0.63 0.64  BUN/Creat Ratio 6 - 22 (calc) - - NOT APPLICABLE  Sodium 893 - 145 mEq/L 137 137 138  Potassium 3.5 - 5.1 mEq/L 4.0 3.8  4.2  Chloride 96 - 112 mEq/L 99 99 100  CO2 19 - 32 mEq/L 29 29 27   Calcium 8.4 - 10.5 mg/dL 10.4 10.0 10.0    Current antihypertensive regimen:  Losartan 50 mg daily Triamterene/HCTZ 75-50 mg 1/2 tab daily  How often are you checking your Blood Pressure?  Patient states she currently does not check her BP at home but she will start and keep a log of her readings.  Current home BP readings:  No readings  What recent interventions/DTPs have been made by any provider to improve Blood Pressure control since last [name] Visit:   None noted  Any recent hospitalizations or ED visits since last visit with [name]?  No  What diet changes have been made to improve Blood Pressure Control?  Patient states she watch what she eats. What exercise is being  done to improve your Blood Pressure Control?  Patient states she is doing PT for her left knee, once a week. She also does home exercise as much as her knee tolerates it.  Adherence Review: Is the patient currently on ACE/ARB medication? Yes Does the patient have >5 day gap between last estimated fill dates? No  Care Gaps: HEMOGLOBIN A1C (Every 6 Months)Last completed: Oct 07, 2020 FOOT EXAM [facility] completed: Oct 07, 2020 DEXA SCAN (Every 3 Years)Last completed: Nov 23, 2019 TETANUS/TDAP (Every 10 Shawn Stall completed: Mar 28, 2013 OPHTHALMOLOGY EXAM [facility] completed: Dec 04, 2019  Star Rating Drugs: Pravastatin - filled 12/09/20 Losartan - filled 12/09/20  Orinda Kenner, [place] Clinical Pharmacists Assistant [phone]

## 2021-01-15 ENCOUNTER — Encounter: Payer: Self-pay | Admitting: Internal Medicine

## 2021-01-15 NOTE — Progress Notes (Signed)
Outside notes received. Information abstracted. Notes sent to scan.  

## 2021-01-16 DIAGNOSIS — M79605 Pain in left leg: Secondary | ICD-10-CM | POA: Diagnosis not present

## 2021-01-20 NOTE — Progress Notes (Signed)
error 

## 2021-01-22 DIAGNOSIS — M79605 Pain in left leg: Secondary | ICD-10-CM | POA: Diagnosis not present

## 2021-01-26 DIAGNOSIS — M792 Neuralgia and neuritis, unspecified: Secondary | ICD-10-CM | POA: Diagnosis not present

## 2021-01-26 DIAGNOSIS — M79605 Pain in left leg: Secondary | ICD-10-CM | POA: Diagnosis not present

## 2021-02-23 DIAGNOSIS — M79605 Pain in left leg: Secondary | ICD-10-CM | POA: Diagnosis not present

## 2021-03-03 ENCOUNTER — Encounter: Payer: Medicare Other | Admitting: Adult Health

## 2021-03-04 NOTE — Progress Notes (Signed)
CLINIC:  Survivorship   REASON FOR VISIT:  Routine follow-up for history of breast cancer.   BRIEF ONCOLOGIC HISTORY:   Per Dr. Geralyn Flash note from 01/30/2015: #1 T1c N0 breast cancer status post lumpectomy 05/07/2002. She went on to complete radiation therapy adjuvantly on 08/19/2012.  #2 patient took Arimidex 1 mg daily from June 2004 to June 2014.  INTERVAL HISTORY:  Tiffany Hood presents to the [facility]orship [facility] today for routine follow-up for her history of breast cancer.   Her most most recent mammogram was completed on December 17, 2020 and showed no evidence of malignancy and breast density category C.  She underwent bone density testing on November 23, 2019 that showed mild osteopenia with a T score of -1.4 in her left forearm radius.  Tiffany Hood is doing well today.  She is up-to-date with the primary care.  She is up-to-date with her health maintenance and immunizations.  She does note that she needs her shingles vaccine and is looking to do this possibly in the near because of insurance 100%.  Otherwise she is quite well today and has no questions or concerns.  REVIEW OF SYSTEMS:  Review of Systems  Constitutional:  Negative for appetite change, chills, fatigue, fever and unexpected weight change.  HENT:   Negative for hearing loss, lump/mass and trouble swallowing.   Eyes:  Negative for eye problems and icterus.  Respiratory:  Negative for chest tightness, cough and shortness of breath.   Cardiovascular:  Negative for chest pain, leg swelling and palpitations.  Gastrointestinal:  Negative for abdominal distention, abdominal pain, constipation and diarrhea.  Endocrine: Negative for hot flashes.  Musculoskeletal:  Negative for arthralgias.  Skin:  Negative for itching and rash.  Neurological:  Negative for dizziness, extremity weakness, headaches and numbness.  Hematological:  Negative for adenopathy. Does not bruise/bleed easily.  Psychiatric/Behavioral:  Negative for  depression. The patient is not nervous/anxious.  Breast: Denies any new nodularity, masses, tenderness, nipple changes, or nipple discharge.       PAST MEDICAL/SURGICAL HISTORY:  Past Medical History:  Diagnosis Date  . Breast cancer ([place]) left-2004 sx  . Diabetes mellitus    TYPE 2  . Diverticulitis    09/14/12  . Diverticulosis   . Heart murmur   . History of radiation therapy   . History of urinary tract infection   . Hyperlipidemia   . Hypertension   . Osteoporosis    Fosamax Rxed 2/13  . Personal history of colonic adenomas 11/15/2012  . Personal history of radiation therapy 06/2002  . Pneumonia    2013  . Seasonal allergies   . Tingling    right leg    Past Surgical History:  Procedure Laterality Date  . ABDOMINAL HYSTERECTOMY  1965   ovaries remain; for dysfunctional menses; ? endometriosis  . ABDOMINAL SURGERY    . BREAST BIOPSY Left 04/16/2002   malignant  . BREAST LUMPECTOMY Left   . colon polyps  2014   Dr Carlean Purl  . COLONOSCOPY  2004    Tics  . DILATION AND CURETTAGE OF UTERUS    . LAPAROSCOPIC SIGMOID COLECTOMY  04/13/2013   DR TOTH  . LAPAROSCOPIC SIGMOID COLECTOMY N/A 04/13/2013   Procedure: LAPAROSCOPIC ASSISTED SIGMOID COLECTOMY;  Surgeon: Merrie Roof, MD;  Location: [facility];  Service: General;  Laterality: N/A;  . LUMBAR LAMINECTOMY/DECOMPRESSION MICRODISCECTOMY N/A 02/19/2015   Procedure: MICRODISCECTOMY AT L4-L5, CENTRAL DECOMPRESSION L4-L5 FOR SPINAL STENOSIS, FORAMINOTOMY FOR L4 L5 ROOT ON RIGHT;  Surgeon:  Latanya Maudlin, MD;  Location: [facility];  Service: Orthopedics;  Laterality: N/A;  . removal bone spur Left 2/14   2nd finger  . TONSILLECTOMY AND ADENOIDECTOMY       ALLERGIES:  Allergies  Allergen Reactions  . Celestone [Betamethasone] Nausea Only    Sweated; face and chest with redness for several days      CURRENT MEDICATIONS:  Outpatient Encounter Medications as of 03/05/2021  Medication Sig Note  . Ascorbic Acid (VITAMIN C)  1000 MG tablet Take 500 mg by mouth daily.   . Biotin w/ Vitamins C & E (HAIR SKIN & NAILS GUMMIES PO) Take 1 each by mouth daily.   . Calcium Carbonate-Vitamin D (CALTRATE 600+D PO) Take 1 capsule by mouth in the morning and at bedtime.   . cycloSPORINE (RESTASIS) 0.05 % ophthalmic emulsion Apply 1 drop to eye in the morning and at bedtime.   . diclofenac Sodium (VOLTAREN) 1 % GEL Apply 2 g topically 4 (four) times daily as needed.   . gabapentin (NEURONTIN) 100 MG capsule Take 100 mg by mouth 3 (three) times daily.   Marland Kitchen icosapent Ethyl (VASCEPA) 1 g capsule TAKE 2 CAPSULES BY MOUTH  TWICE DAILY   . Lancets (ONETOUCH ULTRASOFT) lancets USE AS INSTRUCTED TO CHECK  BLOOD SUGARS DAILY   . losartan (COZAAR) 50 MG tablet Take 1 tablet (50 mg total) by mouth daily.   . Melatonin 5 MG TABS Take 5 mg by mouth daily.   . meloxicam (MOBIC) 7.5 MG tablet TAKE 1 TABLET BY MOUTH  DAILY   . Multiple Vitamins-Minerals (PRESERVISION AREDS PO) Take by mouth.   . ONE TOUCH ULTRA TEST test strip USE TO CHECK BLOOD SUGAR  DAILY   . Polyethyl Glycol-Propyl Glycol 0.4-0.3 % SOLN Place 1 drop into both eyes every morning. 02/02/2018: Pt uses 4-5 times daily  . pravastatin (PRAVACHOL) 40 MG tablet TAKE 1 TABLET BY MOUTH  DAILY   . triamterene-hydrochlorothiazide (MAXZIDE) 75-50 MG tablet TAKE ONE-HALF TABLET BY  MOUTH DAILY   . [DISCONTINUED] benzonatate (TESSALON) 100 MG capsule Take 1 capsule (100 mg total) by mouth 3 (three) times daily as needed for cough.   . [DISCONTINUED] icosapent Ethyl (VASCEPA) 1 g capsule Take 2 capsules (2 g total) by mouth 2 (two) times daily.    No facility-administered encounter medications on file as of 03/05/2021.     ONCOLOGIC FAMILY HISTORY:  Family History  Problem Relation Age of Onset  . Stroke Mother 48  . Hypertension Mother   . Lung cancer Father        smoker  . Cancer - Lung Father   . Hypertension Brother   . Heart disease Brother 75  . Stroke Brother 4  .  Hypertension Brother   . Hypertension Sister   . Diabetes Neg Hx   . Colon cancer Neg Hx   . Esophageal cancer Neg Hx   . Rectal cancer Neg Hx   . Stomach cancer Neg Hx   . Colon polyps Neg Hx     GENETIC COUNSELING/TESTING: Not at this time  SOCIAL HISTORY:  Social History   Socioeconomic History  . Marital status: Widowed    Spouse name: Not on file  . Number of children: Not on file  . Years of education: Not on file  . Highest education level: Not on file  Occupational History  . Not on file  Tobacco Use  . Smoking status: Never  . Smokeless tobacco: Never  Vaping Use  .  Vaping Use: Never used  Substance and Sexual Activity  . Alcohol use: No    Alcohol/week: 0.0 standard drinks  . Drug use: No  . Sexual activity: Not on file  Other Topics Concern  . Not on file  Social History Narrative   Exercise: no   Social Determinants of Health   Financial Resource Strain: Low Risk   . Difficulty of Paying Living Expenses: Not hard at all  Food Insecurity: No Food Insecurity  . Worried About Charity fundraiser in the Last Year: Never true  . Ran Out of Food in the Last Year: Never true  Transportation Needs: No Transportation Needs  . Lack of Transportation (Medical): No  . Lack of Transportation (Non-Medical): No  Physical Activity: Sufficiently Active  . Days of Exercise per Week: 5 days  . Minutes of Exercise per Session: 30 min  Stress: No Stress Concern Present  . Feeling of Stress : Not at all  Social Connections: Moderately Integrated  . Frequency of Communication with Friends and Family: More than three times a week  . Frequency of Social Gatherings with Friends and Family: More than three times a week  . Attends Religious Services: More than 4 times per year  . Active Member of Clubs or Organizations: No  . Attends Archivist Meetings: More than 4 times per year  . Marital Status: Widowed  Intimate Partner Violence: Not on file       PHYSICAL EXAMINATION:  Vital Signs: Vitals:   03/05/21 1054  BP: (!) 159/75  Pulse: 80  Resp: 17  Temp: 97.8 F (36.6 C)  SpO2: 99%   Filed Weights   03/05/21 1054  Weight: 160 lb 14.4 oz (73 kg)   General: Well-nourished, well-appearing female in no acute distress.  Unaccompanied today.   HEENT: Head is normocephalic.  Pupils equal and reactive to light. Conjunctivae clear without exudate.  Sclerae anicteric. Mask in place  Lymph: No cervical, supraclavicular, or infraclavicular lymphadenopathy noted on palpation.  Cardiovascular: Regular rate and rhythm.Marland Kitchen Respiratory: Clear to auscultation bilaterally. Chest expansion symmetric; breathing non-labored.  Breast Exam:  -Left breast: No appreciable masses on palpation. No skin redness, thickening, or peau d'orange appearance; no nipple retraction or nipple discharge; mild distortion in symmetry at previous lumpectomy site well healed scar without erythema or nodularity.  -Right breast: No appreciable masses on palpation. No skin redness, thickening, or peau d'orange appearance; no nipple retraction or nipple discharge; -Axilla: No axillary adenopathy bilaterally.  GI: Abdomen soft and round; non-tender, non-distended. Bowel sounds normoactive. No hepatosplenomegaly.   GU: Deferred.  Neuro: No focal deficits. Steady gait.  Psych: Mood and affect normal and appropriate for situation.  MSK: No focal spinal tenderness to palpation, full range of motion in bilateral upper extremities Extremities: No edema. Skin: Warm and dry.  LABORATORY DATA:  None for this visit   DIAGNOSTIC IMAGING:  Most recent mammogram:  CLINICAL DATA:  Screening.   EXAM: DIGITAL SCREENING BILATERAL MAMMOGRAM WITH TOMOSYNTHESIS AND CAD   TECHNIQUE: Bilateral screening digital craniocaudal and mediolateral oblique mammograms were obtained. Bilateral screening digital breast tomosynthesis was performed. The images were evaluated  with computer-aided detection.   COMPARISON:  Previous exam(s).   ACR Breast Density Category c: The breast tissue is heterogeneously dense, which may obscure small masses.   FINDINGS: There are no findings suspicious for malignancy.   IMPRESSION: No mammographic evidence of malignancy. A result letter of this screening mammogram will be mailed directly to the patient.  RECOMMENDATION: Screening mammogram in one year. (Code:[id number]-B-01Y)   BI-RADS CATEGORY  1: Negative.     Electronically Signed   By: Ammie Ferrier M.D.   On: 12/17/2020 10:54  ASSESSMENT AND PLAN:  Ms.. Hood is a pleasant 80 y.o. 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  She presents to the [facility] for surveillance and routine follow-up.   1. History of breast cancer:  Tiffany Hood is currently clinically and radiographically without evidence of disease or recurrence of breast cancer. She will be due for mammogram in 11/2021.  She will return in one year for LTS follow up.  I encouraged her to call me with any questions or concerns before her next visit at the cancer center, and I would be happy to see her sooner, if needed.     2. Bone health:  Given Tiffany Hood age, history of breast cancer, and her previous anti-estrogen therapy with Anastrozole, she is at risk for bone demineralization. Her bone density in 10/2019 showed mild osteopenia with a t score of -1.4.  She is due for repeat in August 2023.  She was given education on specific food and activities to promote bone health.  3. Cancer screening:  Due to Tiffany Hood history and her age, she should receive screening for skin cancers. She was encouraged to follow-up with her PCP for appropriate cancer screenings.   4. Health maintenance and wellness  promotion: Tiffany Hood was encouraged to consume 5-7 servings of fruits and vegetables per day. She was also encouraged to engage in moderate to vigorous exercise for 30 minutes per day most days of the week. She was instructed to limit her alcohol consumption and continue to abstain from tobacco use.     Dispo:  -Return to cancer center in one year -Bone density due 10/2021 -Mammogram 11/2021  Total encounter time: 20 minutes*in face-to-face visit time, chart review, lab review, care coordination, order entry, and documentation of the encounter.  Wilber Bihari, NP 03/05/21 11:18 AM Medical Oncology and Hematology [facility], [place] [address] Tel. [phone]    Fax. [phone]  *Total Encounter Time as defined by the Centers for Medicare and Medicaid Services includes, in addition to the face-to-face time of a patient visit (documented in the note above) non-face-to-face time: obtaining and reviewing outside history, ordering and reviewing medications, tests or procedures, care coordination (communications with other health care professionals or caregivers) and documentation in the medical record.     Note: PRIMARY CARE PROVIDER Binnie Rail, [place] [phone]

## 2021-03-05 ENCOUNTER — Encounter: Payer: Self-pay | Admitting: Adult Health

## 2021-03-05 ENCOUNTER — Inpatient Hospital Stay: Payer: Medicare Other | Attending: Adult Health | Admitting: Adult Health

## 2021-03-05 ENCOUNTER — Other Ambulatory Visit: Payer: Self-pay | Admitting: Internal Medicine

## 2021-03-05 ENCOUNTER — Telehealth: Payer: Self-pay | Admitting: Adult Health

## 2021-03-05 ENCOUNTER — Other Ambulatory Visit: Payer: Self-pay

## 2021-03-05 VITALS — BP 159/75 | HR 80 | Temp 97.8°F | Resp 17 | Ht 63.0 in | Wt 160.9 lb

## 2021-03-05 DIAGNOSIS — Z853 Personal history of malignant neoplasm of breast: Secondary | ICD-10-CM | POA: Diagnosis not present

## 2021-03-05 DIAGNOSIS — M8580 Other specified disorders of bone density and structure, unspecified site: Secondary | ICD-10-CM | POA: Insufficient documentation

## 2021-03-05 NOTE — Telephone Encounter (Signed)
Scheduled appointment per 12/8 los. Left message.

## 2021-03-19 ENCOUNTER — Telehealth: Payer: Self-pay

## 2021-03-19 NOTE — Progress Notes (Signed)
° ° °  Chronic Care Management Pharmacy Assistant   Name: Tiffany Hood  MRN: [id number] DOB: [birth year]  Reason for Encounter: Disease State - General Adherence  Appointment: Telephone April 3 @ [facility]  Recent office visits:  None listed  Recent consult visits:  03/05/21 [facility] (Oncology) - History of cancer of left breast. D/c Benzonatate 100 mg.  Hospital visits:  None in previous 6 months  Medications: Outpatient Encounter Medications as of 03/19/2021  Medication Sig Note   Ascorbic Acid (VITAMIN C) 1000 MG tablet Take 500 mg by mouth daily.    Biotin w/ Vitamins C & E (HAIR SKIN & NAILS GUMMIES PO) Take 1 each by mouth daily.    Calcium Carbonate-Vitamin D (CALTRATE 600+D PO) Take 1 capsule by mouth in the morning and at bedtime.    cycloSPORINE (RESTASIS) 0.05 % ophthalmic emulsion Apply 1 drop to eye in the morning and at bedtime.    diclofenac Sodium (VOLTAREN) 1 % GEL Apply 2 g topically 4 (four) times daily as needed.    gabapentin (NEURONTIN) 100 MG capsule Take 100 mg by mouth 3 (three) times daily.    icosapent Ethyl (VASCEPA) 1 g capsule TAKE 2 CAPSULES BY MOUTH  TWICE DAILY    Lancets (ONETOUCH ULTRASOFT) lancets USE AS INSTRUCTED TO CHECK  BLOOD SUGARS DAILY    losartan (COZAAR) 50 MG tablet Take 1 tablet (50 mg total) by mouth daily.    Melatonin 5 MG TABS Take 5 mg by mouth daily.    meloxicam (MOBIC) 7.5 MG tablet TAKE 1 TABLET BY MOUTH  DAILY    Multiple Vitamins-Minerals (PRESERVISION AREDS PO) Take by mouth.    ONE TOUCH ULTRA TEST test strip USE TO CHECK BLOOD SUGAR  DAILY    Polyethyl Glycol-Propyl Glycol 0.4-0.3 % SOLN Place 1 drop into both eyes every morning. 02/02/2018: Pt uses 4-5 times daily   pravastatin (PRAVACHOL) 40 MG tablet TAKE 1 TABLET BY MOUTH  DAILY    triamterene-hydrochlorothiazide (MAXZIDE) 75-50 MG tablet TAKE ONE-HALF TABLET BY  MOUTH DAILY    No facility-administered encounter medications on file as of 03/19/2021.    Reviewed chart for  medication changes and drug therapy problems ahead of medication adherence call.  Attempted to contact patient for medication review and health check, unable to reach patient, left voicemails to return call.     Care Gaps HEMOGLOBIN A1C (Every 6 Months)Last completed: Oct 07, 2020 FOOT EXAM [facility] completed: Oct 07, 2020 DEXA SCAN (Every 3 Years)Last completed: Nov 23, 2019 TETANUS/TDAP (Every 10 Shawn Stall completed: Mar 28, 2013 OPHTHALMOLOGY EXAM [facility] completed: Dec 04, 2019  Star Rating Drugs: Pravastatin - filled 12/09/20 Losartan - filled 01/02/21 [place], [place] Clinical Pharmacists Assistant [phone]

## 2021-03-26 DIAGNOSIS — M79605 Pain in left leg: Secondary | ICD-10-CM | POA: Diagnosis not present

## 2021-03-26 DIAGNOSIS — M792 Neuralgia and neuritis, unspecified: Secondary | ICD-10-CM | POA: Diagnosis not present

## 2021-03-26 DIAGNOSIS — M25562 Pain in left knee: Secondary | ICD-10-CM | POA: Diagnosis not present

## 2021-03-28 ENCOUNTER — Other Ambulatory Visit: Payer: Self-pay | Admitting: Internal Medicine

## 2021-04-07 DIAGNOSIS — H401112 Primary open-angle glaucoma, right eye, moderate stage: Secondary | ICD-10-CM | POA: Diagnosis not present

## 2021-04-07 DIAGNOSIS — H16223 Keratoconjunctivitis sicca, not specified as Sjogren's, bilateral: Secondary | ICD-10-CM | POA: Diagnosis not present

## 2021-04-07 DIAGNOSIS — H401121 Primary open-angle glaucoma, left eye, mild stage: Secondary | ICD-10-CM | POA: Diagnosis not present

## 2021-04-07 DIAGNOSIS — H2513 Age-related nuclear cataract, bilateral: Secondary | ICD-10-CM | POA: Diagnosis not present

## 2021-04-07 DIAGNOSIS — H0102A Squamous blepharitis right eye, upper and lower eyelids: Secondary | ICD-10-CM | POA: Diagnosis not present

## 2021-04-07 DIAGNOSIS — H0102B Squamous blepharitis left eye, upper and lower eyelids: Secondary | ICD-10-CM | POA: Diagnosis not present

## 2021-04-07 DIAGNOSIS — E119 Type 2 diabetes mellitus without complications: Secondary | ICD-10-CM | POA: Diagnosis not present

## 2021-04-09 ENCOUNTER — Encounter: Payer: Self-pay | Admitting: Internal Medicine

## 2021-04-09 NOTE — Patient Instructions (Addendum)
Blood work was ordered.     Medications changes include :   none    Please followup in 6 months    Health Maintenance, Female Adopting a healthy lifestyle and getting preventive care are important in promoting health and wellness. Ask your health care provider about: The right schedule for you to have regular tests and exams. Things you can do on your own to prevent diseases and keep yourself healthy. What should I know about diet, weight, and exercise? Eat a healthy diet  Eat a diet that includes plenty of vegetables, fruits, low-fat dairy products, and lean protein. Do not eat a lot of foods that are high in solid fats, added sugars, or sodium. Maintain a healthy weight Body mass index (BMI) is used to identify weight problems. It estimates body fat based on height and weight. Your health care provider can help determine your BMI and help you achieve or maintain a healthy weight. Get regular exercise Get regular exercise. This is one of the most important things you can do for your health. Most adults should: Exercise for at least 150 minutes each week. The exercise should increase your heart rate and make you sweat (moderate-intensity exercise). Do strengthening exercises at least twice a week. This is in addition to the moderate-intensity exercise. Spend less time sitting. Even light physical activity can be beneficial. Watch cholesterol and blood lipids Have your blood tested for lipids and cholesterol at 80 years of age, then have this test every 5 years. Have your cholesterol levels checked more often if: Your lipid or cholesterol levels are high. You are older than 80 years of age. You are at high risk for heart disease. What should I know about cancer screening? Depending on your health history and family history, you may need to have cancer screening at various ages. This may include screening for: Breast cancer. Cervical cancer. Colorectal cancer. Skin  cancer. Lung cancer. What should I know about heart disease, diabetes, and high blood pressure? Blood pressure and heart disease High blood pressure causes heart disease and increases the risk of stroke. This is more likely to develop in people who have high blood pressure readings or are overweight. Have your blood pressure checked: Every 3-5 years if you are 63-46 years of age. Every year if you are 79 years old or older. Diabetes Have regular diabetes screenings. This checks your fasting blood sugar level. Have the screening done: Once every three years after age 67 if you are at a normal weight and have a low risk for diabetes. More often and at a younger age if you are overweight or have a high risk for diabetes. What should I know about preventing infection? Hepatitis B If you have a higher risk for hepatitis B, you should be screened for this virus. Talk with your health care provider to find out if you are at risk for hepatitis B infection. Hepatitis C Testing is recommended for: Everyone born from [birth year] through 1964. Anyone with known risk factors for hepatitis C. Sexually transmitted infections (STIs) Get screened for STIs, including gonorrhea and chlamydia, if: You are sexually active and are younger than 80 years of age. You are older than 80 years of age and your health care provider tells you that you are at risk for this type of infection. Your sexual activity has changed since you were last screened, and you are at increased risk for chlamydia or gonorrhea. Ask your health care provider if you are at  risk. Ask your health care provider about whether you are at high risk for HIV. Your health care provider may recommend a prescription medicine to help prevent HIV infection. If you choose to take medicine to prevent HIV, you should first get tested for HIV. You should then be tested every 3 months for as long as you are taking the medicine. Pregnancy If you are about to stop  having your period (premenopausal) and you may become pregnant, seek counseling before you get pregnant. Take 400 to 800 micrograms (mcg) of folic acid every day if you become pregnant. Ask for birth control (contraception) if you want to prevent pregnancy. Osteoporosis and menopause Osteoporosis is a disease in which the bones lose minerals and strength with aging. This can result in bone fractures. If you are 32 years old or older, or if you are at risk for osteoporosis and fractures, ask your health care provider if you should: Be screened for bone loss. Take a calcium or vitamin D supplement to lower your risk of fractures. Be given hormone replacement therapy (HRT) to treat symptoms of menopause. Follow these instructions at home: Alcohol use Do not drink alcohol if: Your health care provider tells you not to drink. You are pregnant, may be pregnant, or are planning to become pregnant. If you drink alcohol: Limit how much you have to: 0-1 drink a day. Know how much alcohol is in your drink. In the [place] one drink equals one 12 oz bottle of beer (355 mL), one 5 oz glass of wine (148 mL), or one 1 oz glass of hard liquor (44 mL). Lifestyle Do not use any products that contain nicotine or tobacco. These products include cigarettes, chewing tobacco, and vaping devices, such as e-cigarettes. If you need help quitting, ask your health care provider. Do not use street drugs. Do not share needles. Ask your health care provider for help if you need support or information about quitting drugs. General instructions Schedule regular health, dental, and eye exams. Stay current with your vaccines. Tell your health care provider if: You often feel depressed. You have ever been abused or do not feel safe at home. Summary Adopting a healthy lifestyle and getting preventive care are important in promoting health and wellness. Follow your health care provider's instructions about healthy diet,  exercising, and getting tested or screened for diseases. Follow your health care provider's instructions on monitoring your cholesterol and blood pressure. This information is not intended to replace advice given to you by your health care provider. Make sure you discuss any questions you have with your health care provider. Document Revised: 08/04/2020 Document Reviewed: 08/04/2020 Elsevier Patient Education  [place].

## 2021-04-09 NOTE — Progress Notes (Signed)
Subjective:    Patient ID: Tiffany Hood, female    DOB: [birth year], 81 y.o.   MRN: [id number]   This visit occurred during the SARS-CoV-2 public health emergency.  Safety protocols were in place, including screening questions prior to the visit, additional usage of staff PPE, and extensive cleaning of exam room while observing appropriate contact time as indicated for disinfecting solutions.    HPI She is here for a physical exam.   Overall she feels well and has no concerns.  Had an injection in her left knee recently and that has helped with her pain.  Medications and allergies reviewed with patient and updated if appropriate.  Patient Active Problem List   Diagnosis Date Noted   Glaucoma 04/10/2021   Osteoarthritis of left knee 04/10/2021   Multiple thyroid nodules 08/12/2016   Diabetes ([place]) 08/08/2015   Spinal stenosis, lumbar region, with neurogenic claudication 02/19/2015   Osteopenia 03/30/2013   Diverticulitis of sigmoid colon s/p sigmoidectomy 10/05/2012   Diverticulosis 10/19/2011   Osteoarthritis 01/28/2010   Hyperlipidemia 12/25/2007   Essential hypertension 02/10/2007   History of cancer of left breast 05/02/2002    Current Outpatient Medications on File Prior to Visit  Medication Sig Dispense Refill   Ascorbic Acid (VITAMIN C) 1000 MG tablet Take 500 mg by mouth daily.     Biotin w/ Vitamins C & E (HAIR SKIN & NAILS GUMMIES PO) Take 1 each by mouth daily.     Calcium Carbonate-Vitamin D (CALTRATE 600+D PO) Take 1 capsule by mouth in the morning and at bedtime.     cycloSPORINE (RESTASIS) 0.05 % ophthalmic emulsion Apply 1 drop to eye in the morning and at bedtime.     diclofenac Sodium (VOLTAREN) 1 % GEL Apply 2 g topically 4 (four) times daily as needed.     gabapentin (NEURONTIN) 100 MG capsule Take 100 mg by mouth 3 (three) times daily.     icosapent Ethyl (VASCEPA) 1 g capsule TAKE 2 CAPSULES BY MOUTH  TWICE DAILY 360 capsule 3   Lancets (ONETOUCH  ULTRASOFT) lancets USE AS INSTRUCTED TO CHECK  BLOOD SUGARS DAILY 100 each 3   losartan (COZAAR) 50 MG tablet TAKE 1 TABLET BY MOUTH  DAILY 90 tablet 1   Melatonin 5 MG TABS Take 5 mg by mouth daily.     meloxicam (MOBIC) 7.5 MG tablet TAKE 1 TABLET BY MOUTH  DAILY 90 tablet 3   metroNIDAZOLE (METROGEL) 0.75 % gel SMARTSIG:Gel Topical Daily     Multiple Vitamins-Minerals (PRESERVISION AREDS PO) Take by mouth.     ONE TOUCH ULTRA TEST test strip USE TO CHECK BLOOD SUGAR  DAILY 100 each 3   Polyethyl Glycol-Propyl Glycol 0.4-0.3 % SOLN Place 1 drop into both eyes every morning.     pravastatin (PRAVACHOL) 40 MG tablet TAKE 1 TABLET BY MOUTH  DAILY 90 tablet 3   triamterene-hydrochlorothiazide (MAXZIDE) 75-50 MG tablet TAKE ONE-HALF TABLET BY  MOUTH DAILY 45 tablet 3   No current facility-administered medications on file prior to visit.    Past Medical History:  Diagnosis Date   Breast cancer ([place]) left-2004 sx   Diabetes mellitus    TYPE 2   Diverticulitis    09/14/12   Diverticulosis    Heart murmur    History of radiation therapy    History of urinary tract infection    Hyperlipidemia    Hypertension    Osteoporosis    Fosamax Rxed 2/13   Personal history  of colonic adenomas 11/15/2012   Personal history of radiation therapy 06/2002   Pneumonia    2013   Seasonal allergies    Tingling    right leg     Past Surgical History:  Procedure Laterality Date   ABDOMINAL HYSTERECTOMY  1965   ovaries remain; for dysfunctional menses; ? endometriosis   ABDOMINAL SURGERY     BREAST BIOPSY Left 04/16/2002   malignant   BREAST LUMPECTOMY Left    colon polyps  2014   Dr Carlean Purl   COLONOSCOPY  2004    Tics   DILATION AND CURETTAGE OF UTERUS     LAPAROSCOPIC SIGMOID COLECTOMY  04/13/2013   DR Sims N/A 04/13/2013   Procedure: LAPAROSCOPIC ASSISTED SIGMOID COLECTOMY;  Surgeon: Merrie Roof, MD;  Location: [facility];  Service: General;  Laterality: N/A;    LUMBAR LAMINECTOMY/DECOMPRESSION MICRODISCECTOMY N/A 02/19/2015   Procedure: MICRODISCECTOMY AT L4-L5, CENTRAL DECOMPRESSION L4-L5 FOR SPINAL STENOSIS, FORAMINOTOMY FOR L4 L5 ROOT ON RIGHT;  Surgeon: Latanya Maudlin, MD;  Location: [facility];  Service: Orthopedics;  Laterality: N/A;   removal bone spur Left 2/14   2nd finger   TONSILLECTOMY AND ADENOIDECTOMY      Social History   Socioeconomic History   Marital status: Widowed    Spouse name: Not on file   Number of children: Not on file   Years of education: Not on file   Highest education level: Not on file  Occupational History   Not on file  Tobacco Use   Smoking status: Never   Smokeless tobacco: Never  Vaping Use   Vaping Use: Never used  Substance and Sexual Activity   Alcohol use: No    Alcohol/week: 0.0 standard drinks   Drug use: No   Sexual activity: Not on file  Other Topics Concern   Not on file  Social History Narrative   Exercise: no   Social Determinants of Health   Financial Resource Strain: Low Risk    Difficulty of Paying Living Expenses: Not hard at all  Food Insecurity: No Food Insecurity   Worried About Charity fundraiser in the Last Year: Never true   [place] in the Last Year: Never true  Transportation Needs: No Transportation Needs   Lack of Transportation (Medical): No   Lack of Transportation (Non-Medical): No  Physical Activity: Sufficiently Active   Days of Exercise per Week: 5 days   Minutes of Exercise per Session: 30 min  Stress: No Stress Concern Present   Feeling of Stress : Not at all  Social Connections: Moderately Integrated   Frequency of Communication with Friends and Family: More than three times a week   Frequency of Social Gatherings with Friends and Family: More than three times a week   Attends Religious Services: More than 4 times per year   Active Member of Clubs or Organizations: No   Attends Music therapist: More than 4 times per year   Marital  Status: Widowed    Family History  Problem Relation Age of Onset   Stroke Mother 61   Hypertension Mother    Lung cancer Father        smoker   Cancer - Lung Father    Hypertension Brother    Heart disease Brother 34   Stroke Brother 66   Hypertension Brother    Hypertension Sister    Diabetes Neg Hx    Colon cancer Neg Hx  Esophageal cancer Neg Hx    Rectal cancer Neg Hx    Stomach cancer Neg Hx    Colon polyps Neg Hx     Review of Systems  Constitutional:  Negative for chills and fever.  Eyes:  Negative for visual disturbance.  Respiratory:  Negative for cough, shortness of breath and wheezing.   Cardiovascular:  Positive for leg swelling (left from knee OA). Negative for chest pain and palpitations.  Gastrointestinal:  Negative for abdominal pain, blood in stool, constipation, diarrhea and nausea.       No gerd  Genitourinary:  Negative for dysuria.  Musculoskeletal:  Positive for arthralgias (left knee). Negative for back pain.  Skin:  Negative for color change and rash.  Neurological:  Negative for light-headedness and headaches.  Psychiatric/Behavioral:  Negative for dysphoric mood. The patient is not nervous/anxious.       Objective:   Vitals:   04/10/21 1117  BP: (!) 142/82  Pulse: 76  Temp: 98 F (36.7 C)  SpO2: 96%   Filed Weights   04/10/21 1117  Weight: 160 lb 9.6 oz (72.8 kg)   Body mass index is 28.45 kg/m.  BP Readings from Last 3 Encounters:  04/10/21 (!) 142/82  03/05/21 (!) 159/75  10/07/20 138/80    Wt Readings from Last 3 Encounters:  04/10/21 160 lb 9.6 oz (72.8 kg)  03/05/21 160 lb 14.4 oz (73 kg)  10/07/20 163 lb (73.9 kg)    Depression screen [facility] 2/9 04/10/2021 07/21/2020 10/11/2019 10/05/2018 08/10/2016  Decreased Interest 1 0 0 0 0  Down, Depressed, Hopeless 1 0 0 0 0  PHQ - 2 Score 2 0 0 0 0  Altered sleeping 1 - - - -  Tired, decreased energy 1 - - - -  Change in appetite 2 - - - -  Feeling bad or failure about yourself   1 - - - -  Trouble concentrating 1 - - - -  Moving slowly or fidgety/restless 0 - - - -  Suicidal thoughts 0 - - - -  PHQ-9 Score 8 - - - -  Difficult doing work/chores Somewhat difficult - - - -  Some recent data might be hidden     GAD 7 : Generalized Anxiety Score 04/10/2021  Nervous, Anxious, on Edge 2  Control/stop worrying 2  Worry too much - different things 2  Trouble relaxing 1  Restless 1  Easily annoyed or irritable 1  Afraid - awful might happen 2  Total GAD 7 Score 11  Anxiety Difficulty Somewhat difficult       Physical Exam Constitutional: She appears well-developed and well-nourished. No distress.  HENT:  Head: Normocephalic and atraumatic.  Right Ear: External ear normal. Normal ear canal and TM Left Ear: External ear normal.  Normal ear canal and TM Mouth/Throat: Oropharynx is clear and moist.  Eyes: Conjunctivae and EOM are normal.  Neck: Neck supple. No tracheal deviation present. No thyromegaly present.  No carotid bruit  Cardiovascular: Normal rate, regular rhythm and normal heart sounds.   No murmur heard.  No edema. Pulmonary/Chest: Effort normal and breath sounds normal. No respiratory distress. She has no wheezes. She has no rales.  Breast: deferred   Abdominal: Soft. She exhibits no distension. There is no tenderness.  Lymphadenopathy: She has no cervical adenopathy.  Skin: Skin is warm and dry. She is not diaphoretic.  Psychiatric: She has a normal mood and affect. Her behavior is normal.     Lab Results  Component Value Date   WBC 7.2 10/09/2019   HGB 13.8 10/09/2019   HCT 41.7 10/09/2019   PLT 231 10/09/2019   GLUCOSE 87 10/07/2020   CHOL 212 (H) 10/07/2020   TRIG 274.0 (H) 10/07/2020   HDL 67.60 10/07/2020   LDLDIRECT 102.0 10/07/2020   LDLCALC  10/09/2019     Comment:     . LDL cholesterol not calculated. Triglyceride levels greater than 400 mg/dL invalidate calculated LDL results. . Reference range: <100 . Desirable range  <100 mg/dL for primary prevention;   <70 mg/dL for patients with CHD or diabetic patients  with > or = 2 CHD risk factors. Marland Kitchen LDL-C is now calculated using the Martin-Hopkins  calculation, which is a validated novel method providing  better accuracy than the Friedewald equation in the  estimation of LDL-C.  Cresenciano Genre et al. Annamaria Helling. 9371;696(78): 2061-2068  (http://education.Quest[employer].com/faq/FAQ164)    ALT 17 10/07/2020   AST 18 10/07/2020   NA 137 10/07/2020   K 4.0 10/07/2020   CL 99 10/07/2020   CREATININE 0.63 10/07/2020   BUN 23 10/07/2020   CO2 29 10/07/2020   TSH 1.61 10/07/2020   INR 0.92 02/18/2015   HGBA1C 6.6 (H) 10/07/2020   MICROALBUR 1.0 08/14/2015         Assessment & Plan:   Physical exam: Screening blood work  ordered Exercise does some exercises for her knee, minimal walking secondary to knee pain Weight okay for age Substance abuse  none   Reviewed recommended immunizations.   Health Maintenance  Topic Date Due   HEMOGLOBIN A1C  04/09/2021   FOOT EXAM  10/07/2021   OPHTHALMOLOGY EXAM  12/09/2021   DEXA SCAN  11/23/2022   TETANUS/TDAP  03/29/2023   Pneumonia Vaccine 60+ Years old  Completed   INFLUENZA VACCINE  Completed   COVID-19 Vaccine  Completed   Hepatitis C Screening  Completed   Zoster Vaccines- Shingrix  Completed   HPV VACCINES  Aged Out          See Problem List for Assessment and Plan of chronic medical problems.

## 2021-04-10 ENCOUNTER — Ambulatory Visit (INDEPENDENT_AMBULATORY_CARE_PROVIDER_SITE_OTHER): Payer: Medicare Other | Admitting: Internal Medicine

## 2021-04-10 ENCOUNTER — Other Ambulatory Visit: Payer: Self-pay

## 2021-04-10 VITALS — BP 142/82 | HR 76 | Temp 98.0°F | Ht 63.0 in | Wt 160.6 lb

## 2021-04-10 DIAGNOSIS — E042 Nontoxic multinodular goiter: Secondary | ICD-10-CM

## 2021-04-10 DIAGNOSIS — E119 Type 2 diabetes mellitus without complications: Secondary | ICD-10-CM

## 2021-04-10 DIAGNOSIS — M85839 Other specified disorders of bone density and structure, unspecified forearm: Secondary | ICD-10-CM | POA: Diagnosis not present

## 2021-04-10 DIAGNOSIS — Z0000 Encounter for general adult medical examination without abnormal findings: Secondary | ICD-10-CM | POA: Diagnosis not present

## 2021-04-10 DIAGNOSIS — M1712 Unilateral primary osteoarthritis, left knee: Secondary | ICD-10-CM | POA: Diagnosis not present

## 2021-04-10 DIAGNOSIS — H409 Unspecified glaucoma: Secondary | ICD-10-CM | POA: Insufficient documentation

## 2021-04-10 DIAGNOSIS — E7849 Other hyperlipidemia: Secondary | ICD-10-CM

## 2021-04-10 DIAGNOSIS — I10 Essential (primary) hypertension: Secondary | ICD-10-CM | POA: Diagnosis not present

## 2021-04-10 LAB — CBC WITH DIFFERENTIAL/PLATELET
Basophils Absolute: 0.1 10*3/uL (ref 0.0–0.1)
Basophils Relative: 0.6 % (ref 0.0–3.0)
Eosinophils Absolute: 0.6 10*3/uL (ref 0.0–0.7)
Eosinophils Relative: 7.2 % — ABNORMAL HIGH (ref 0.0–5.0)
HCT: 40.9 % (ref 36.0–46.0)
Hemoglobin: 13.3 g/dL (ref 12.0–15.0)
Lymphocytes Relative: 19.1 % (ref 12.0–46.0)
Lymphs Abs: 1.6 10*3/uL (ref 0.7–4.0)
MCHC: 32.5 g/dL (ref 30.0–36.0)
MCV: 86.2 fl (ref 78.0–100.0)
Monocytes Absolute: 0.7 10*3/uL (ref 0.1–1.0)
Monocytes Relative: 8.5 % (ref 3.0–12.0)
Neutro Abs: 5.6 10*3/uL (ref 1.4–7.7)
Neutrophils Relative %: 64.6 % (ref 43.0–77.0)
Platelets: 231 10*3/uL (ref 150.0–400.0)
RBC: 4.75 Mil/uL (ref 3.87–5.11)
RDW: 14.8 % (ref 11.5–15.5)
WBC: 8.6 10*3/uL (ref 4.0–10.5)

## 2021-04-10 LAB — LIPID PANEL
Cholesterol: 207 mg/dL — ABNORMAL HIGH (ref 0–200)
HDL: 63.7 mg/dL (ref >39.00)
NonHDL: 143.61
Total CHOL/HDL Ratio: 3
Triglycerides: 318 mg/dL — ABNORMAL HIGH (ref 0.0–149.0)
VLDL: 63.6 mg/dL — ABNORMAL HIGH (ref 0.0–40.0)

## 2021-04-10 LAB — COMPREHENSIVE METABOLIC PANEL
ALT: 15 U/L (ref 0–35)
AST: 16 U/L (ref 0–37)
Albumin: 4.2 g/dL (ref 3.5–5.2)
Alkaline Phosphatase: 70 U/L (ref 39–117)
BUN: 19 mg/dL (ref 6–23)
CO2: 29 mEq/L (ref 19–32)
Calcium: 10.4 mg/dL (ref 8.4–10.5)
Chloride: 100 mEq/L (ref 96–112)
Creatinine, Ser: 0.62 mg/dL (ref 0.40–1.20)
GFR: 84.89 mL/min (ref >60.00)
Glucose, Bld: 86 mg/dL (ref 70–99)
Potassium: 4.3 mEq/L (ref 3.5–5.1)
Sodium: 138 mEq/L (ref 135–145)
Total Bilirubin: 0.3 mg/dL (ref 0.2–1.2)
Total Protein: 7.3 g/dL (ref 6.0–8.3)

## 2021-04-10 LAB — LDL CHOLESTEROL, DIRECT: Direct LDL: 103 mg/dL

## 2021-04-10 LAB — HEMOGLOBIN A1C: Hgb A1c MFr Bld: 6.9 % — ABNORMAL HIGH (ref 4.6–6.5)

## 2021-04-10 LAB — TSH: TSH: 1.32 u[IU]/mL (ref 0.35–5.50)

## 2021-04-10 NOTE — Assessment & Plan Note (Signed)
Chronic Lab Results  Component Value Date   HGBA1C 6.6 (H) 10/07/2020   Controlled with diet Check a1c today

## 2021-04-10 NOTE — Assessment & Plan Note (Signed)
Chronic Euthyroid Stable Due for follow-up ultrasound in 10/2021 TSH

## 2021-04-10 NOTE — Assessment & Plan Note (Signed)
Chronic DEXA up-to-date Encourage as much exercise as possible Continue calcium and vitamin D

## 2021-04-10 NOTE — Assessment & Plan Note (Signed)
Chronic Regular exercise and healthy diet encouraged Check lipid panel  Continue pravastatin 40 mg daily 

## 2021-04-10 NOTE — Assessment & Plan Note (Addendum)
Chronic BP well controlled Continue triamterene-hydrochlorothiazide 75-50 mg-half tab daily, losartan 50 mg daily cmp

## 2021-04-10 NOTE — Assessment & Plan Note (Signed)
Chronic Following with orthopedics Has osteoarthritis and also a loose body in the knee, which does not move and intermittently had some nerve Has had 2 steroid injections that have both helped Taking gabapentin-prescribed by orthopedics Continue meloxicam 7.5 mg daily, which I am prescribing-discussed side effects

## 2021-05-29 ENCOUNTER — Other Ambulatory Visit: Payer: Self-pay | Admitting: Internal Medicine

## 2021-06-25 DIAGNOSIS — M25562 Pain in left knee: Secondary | ICD-10-CM | POA: Diagnosis not present

## 2021-06-25 DIAGNOSIS — M792 Neuralgia and neuritis, unspecified: Secondary | ICD-10-CM | POA: Diagnosis not present

## 2021-06-29 ENCOUNTER — Ambulatory Visit (INDEPENDENT_AMBULATORY_CARE_PROVIDER_SITE_OTHER): Payer: Medicare Other

## 2021-06-29 DIAGNOSIS — I10 Essential (primary) hypertension: Secondary | ICD-10-CM

## 2021-06-29 NOTE — Patient Instructions (Signed)
Visit Information ? ?Following are the goals we discussed today:  ? ?Manage My Medicine  ? ?Timeframe:  Long-Range Goal ?Priority:  Medium ?Start Date:   10/20/20                          ?Expected End Date:    06/2022                 ? ?Follow Up Date 12/2021 ?  ?- call for medicine refill 2 or 3 days before it runs out ?- call if I am sick and can't take my medicine ?- keep a list of all the medicines I take; vitamins and herbals too ?  ?Why is this important?   ?These steps will help you keep on track with your medicines. ? ?Plan: Telephone follow up appointment with care management team member scheduled for:  6 months ?The patient has been provided with contact information for the care management team and has been advised to call with any health related questions or concerns.  ? ?Tomasa Blase, PharmD ?Clinical Pharmacist, [facility]  ? ?Please call the care guide team at [phone] if you need to cancel or reschedule your appointment.  ? ?Patient verbalizes understanding of instructions and care plan provided today and agrees to view in [place]. Active MyChart status confirmed with patient.   ? ?

## 2021-06-29 NOTE — Progress Notes (Signed)
? ?Chronic Care Management ?Pharmacy Note ? ?06/29/2021 ?Name:  Tiffany Hood MRN:  [id number] DOB:  1940-10-13 ? ?Summary: ?-Patient reports adherence to medications as prescribed, denies any issues with current medications  ?-Up to date on vaccinations ? ?Recommendations/Changes made from today's visit: ?-Recommending no changes to medications at this time, patient to reach out with any questions or concerns regarding medications  ?-Agreeable to start monitoring blood pressure once weekly, reviewed BP goals with patient, will reach out should BP average >140/90 ? ? ?Subjective: ?Tiffany Hood is an 81 y.o. year old female who is a primary patient of Burns, Claudina Lick, MD.  The CCM team was consulted for assistance with disease management and care coordination needs.   ? ?Engaged with patient by telephone for follow up visit in response to provider referral for pharmacy case management and/or care coordination services.  ? ?Consent to Services:  ?The patient was given information about Chronic Care Management services, agreed to services, and gave verbal consent prior to initiation of services.  Please see initial visit note for detailed documentation.  ? ?Patient Care Team: ?Binnie Rail, MD as PCP - General (Internal Medicine) ?Gardenia Phlegm, NP as Nurse Practitioner (Hematology and Oncology) ?Tomasa Blase, [facility] as Pharmacist (Pharmacist) ? ? ?Pt moved to [place] in the 1960s, from [place]. Husband died 2001/06/22 of DM, son [place] ?She enjoys gardening, stays active in warmer months. Due to short gut issues, can't go on long trips. ? ?Recent office visits: ?04/10/2021 - Dr. Quay Burow - no changes to medications - f/u in 6 months  ? ?Recent consult visits: ?03/05/2022 - Wilber Bihari NP - Hematology / Oncology - f/u for history of breast cancer - no changes to medications  ? ?Hospital visits: ?None in previous 6 months ? ?Objective: ? ?Lab Results  ?Component Value Date  ? CREATININE  0.62 04/10/2021  ? BUN 19 04/10/2021  ? GFR 84.89 04/10/2021  ? GFRNONAA >60 02/18/2015  ? GFRAA >60 02/18/2015  ? NA 138 04/10/2021  ? K 4.3 04/10/2021  ? CALCIUM 10.4 04/10/2021  ? CO2 29 04/10/2021  ? GLUCOSE 86 04/10/2021  ? ? ?Lab Results  ?Component Value Date/Time  ? HGBA1C 6.9 (H) 04/10/2021 11:55 AM  ? HGBA1C 6.6 (H) 10/07/2020 11:49 AM  ? GFR 84.89 04/10/2021 11:55 AM  ? GFR 84.87 10/07/2020 11:49 AM  ? MICROALBUR 1.0 08/14/2015 08:44 AM  ? MICROALBUR 1.3 05/10/2014 09:34 AM  ?  ?Last diabetic Eye exam:  ?Lab Results  ?Component Value Date/Time  ? HMDIABEYEEXA No Retinopathy 12/09/2020 12:00 AM  ?  ?Last diabetic Foot exam: No results found for: HMDIABFOOTEX  ? ?Lab Results  ?Component Value Date  ? CHOL 207 (H) 04/10/2021  ? HDL 63.70 04/10/2021  ? [place]  10/09/2019  ?   Comment:  ?   . ?LDL cholesterol not calculated. Triglyceride levels ?greater than 400 mg/dL invalidate calculated LDL results. ?. ?Reference range: <100 ?Marland Kitchen ?Desirable range <100 mg/dL for primary prevention;   ?<70 mg/dL for patients with CHD or diabetic patients  ?with > or = 2 CHD risk factors. ?. ?LDL-C is now calculated using the Martin-Hopkins  ?calculation, which is a validated novel method providing  ?better accuracy than the Friedewald equation in the  ?estimation of LDL-C.  ?Cresenciano Genre et al. Annamaria Helling. 8177;116(57): 2061-2068  ?(http://education.[facility].com/faq/FAQ164) ?  ? LDLDIRECT 103.0 04/10/2021  ? TRIG 318.0 (H) 04/10/2021  ? CHOLHDL 3 04/10/2021  ? ? ? ?  Latest Ref Rng & Units 04/10/2021  ? 11:55 AM 10/07/2020  ? 11:49 AM 04/10/2020  ? 10:33 AM  ?Hepatic Function  ?Total Protein 6.0 - 8.3 g/dL 7.3   7.1   7.4    ?Albumin 3.5 - 5.2 g/dL 4.2   4.3   4.4    ?AST 0 - 37 U/L '16   18   16    ' ?ALT 0 - 35 U/L '15   17   16    ' ?Alk Phosphatase 39 - 117 U/L 70   61   63    ?Total Bilirubin 0.2 - 1.2 mg/dL 0.3   0.4   0.4    ? ? ?Lab Results  ?Component Value Date/Time  ? TSH 1.32 04/10/2021 11:55 AM  ? TSH 1.61 10/07/2020 11:49  AM  ? ? ? ?  Latest Ref Rng & Units 04/10/2021  ? 11:55 AM 10/09/2019  ? 11:23 AM 10/05/2018  ?  9:44 AM  ?CBC  ?WBC 4.0 - 10.5 K/uL 8.6   7.2   7.9    ?Hemoglobin 12.0 - 15.0 g/dL 13.3   13.8   13.7    ?Hematocrit 36.0 - 46.0 % 40.9   41.7   41.1    ?Platelets 150.0 - 400.0 K/uL 231.0   231   223.0    ? ? ?Lab Results  ?Component Value Date/Time  ? VD25OH 36.82 08/07/2014 04:35 PM  ? VD25OH 75 10/30/2012 01:26 PM  ? VD25OH 57 09/16/2011 11:07 AM  ? ? ?Clinical ASCVD: No  ?The 10-year ASCVD risk score (Arnett DK, et al., 2019) is: 59.8% ?  Values used to calculate the score: ?    Age: 60 years ?    Sex: Female ?    Is Non-Hispanic African American: No ?    Diabetic: Yes ?    Tobacco smoker: No ?    Systolic Blood Pressure: 820 mmHg ?    Is BP treated: Yes ?    HDL Cholesterol: 63.7 mg/dL ?    Total Cholesterol: 207 mg/dL   ? ? ?  04/10/2021  ? 12:32 PM 07/21/2020  ?  1:01 PM 10/11/2019  ?  2:51 PM  ?Depression screen PHQ 2/9  ?Decreased Interest 1 0 0  ?Down, Depressed, Hopeless 1 0 0  ?PHQ - 2 Score 2 0 0  ?Altered sleeping 1    ?Tired, decreased energy 1    ?Change in appetite 2    ?Feeling bad or failure about yourself  1    ?Trouble concentrating 1    ?Moving slowly or fidgety/restless 0    ?Suicidal thoughts 0    ?PHQ-9 Score 8    ?Difficult doing work/chores Somewhat difficult    ?  ? ?Social History  ? ?Tobacco Use  ?Smoking Status Never  ?Smokeless Tobacco Never  ? ?BP Readings from Last 3 Encounters:  ?04/10/21 (!) 142/82  ?03/05/21 (!) 159/75  ?10/07/20 138/80  ? ?Pulse Readings from Last 3 Encounters:  ?04/10/21 76  ?03/05/21 80  ?10/07/20 74  ? ?Wt Readings from Last 3 Encounters:  ?04/10/21 160 lb 9.6 oz (72.8 kg)  ?03/05/21 160 lb 14.4 oz (73 kg)  ?10/07/20 163 lb (73.9 kg)  ? ?BMI Readings from Last 3 Encounters:  ?04/10/21 28.45 kg/m?  ?03/05/21 28.50 kg/m?  ?10/07/20 28.87 kg/m?  ? ? ?Assessment/Interventions: Review of patient past medical history, allergies, medications, health status, including review  of consultants reports, laboratory and other test data, was performed as  part of comprehensive evaluation and provision of chronic care management services.  ? ?SDOH:  (Social Determinants of Health) assessments and interventions performed: Yes ? ?SDOH Screenings  ? ?Alcohol Screen: Low Risk   ? Last Alcohol Screening Score (AUDIT): 0  ?Depression (PHQ2-9): Medium Risk  ? PHQ-2 Score: 8  ?Financial Resource Strain: Low Risk   ? Difficulty of Paying Living Expenses: Not hard at all  ?Food Insecurity: No Food Insecurity  ? Worried About Charity fundraiser in the Last Year: Never true  ? Ran Out of Food in the Last Year: Never true  ?Housing: Low Risk   ? Last Housing Risk Score: 0  ?Physical Activity: Sufficiently Active  ? Days of Exercise per Week: 5 days  ? Minutes of Exercise per Session: 30 min  ?Social Connections: Moderately Integrated  ? Frequency of Communication with Friends and Family: More than three times a week  ? Frequency of Social Gatherings with Friends and Family: More than three times a week  ? Attends Religious Services: More than 4 times per year  ? Active Member of Clubs or Organizations: No  ? Attends Archivist Meetings: More than 4 times per year  ? Marital Status: Widowed  ?Stress: No Stress Concern Present  ? Feeling of Stress : Not at all  ?Tobacco Use: Low Risk   ? Smoking Tobacco Use: Never  ? Smokeless Tobacco Use: Never  ? Passive Exposure: Not on file  ?Transportation Needs: No Transportation Needs  ? Lack of Transportation (Medical): No  ? Lack of Transportation (Non-Medical): No  ? ? ?CCM Care Plan ? ?Allergies  ?Allergen Reactions  ? [place] [Betamethasone] Nausea Only  ?  Sweated; face and chest with redness for several days   ? ? ?Medications Reviewed Today   ? ? Reviewed by Binnie Rail, MD (Physician) on 04/09/21 at 1715  Med List Status: <None>  ? ?Medication Order Taking? Sig Documenting Provider Last Dose Status Informant  ?Ascorbic Acid (VITAMIN C) 1000 MG  tablet [id number]  Take 500 mg by mouth daily. [provider]  Active   ?Biotin w/ Vitamins C & E (HAIR SKIN & NAILS GUMMIES PO) [id number]  Take 1 each by mouth daily. [provider]

## 2021-07-21 ENCOUNTER — Telehealth: Payer: Self-pay | Admitting: Internal Medicine

## 2021-07-21 NOTE — Telephone Encounter (Signed)
LVM for pt to rtn my call to schedule AWV with [facility]. Please schedule if pt calls the office.  ?

## 2021-07-23 ENCOUNTER — Telehealth: Payer: Self-pay | Admitting: Internal Medicine

## 2021-07-23 NOTE — Telephone Encounter (Signed)
LVM for pt to rtn my call to schedule AWV with [facility]. Please schedule this appt if pt calls the office.  °

## 2021-07-26 DIAGNOSIS — I10 Essential (primary) hypertension: Secondary | ICD-10-CM

## 2021-08-05 ENCOUNTER — Telehealth: Payer: Self-pay | Admitting: Internal Medicine

## 2021-08-05 NOTE — Telephone Encounter (Signed)
LVM for pt to rt my call to schedule awv with [facility]. Please schedule this appt if pt calls the office. ?

## 2021-08-10 ENCOUNTER — Telehealth: Payer: Self-pay | Admitting: Internal Medicine

## 2021-08-10 NOTE — Telephone Encounter (Signed)
LVM for pt to rtn my call to schedule AWV with [facility]. Please schedule if pt calls the office.  ?

## 2021-08-11 DIAGNOSIS — H401121 Primary open-angle glaucoma, left eye, mild stage: Secondary | ICD-10-CM | POA: Diagnosis not present

## 2021-08-11 DIAGNOSIS — H2513 Age-related nuclear cataract, bilateral: Secondary | ICD-10-CM | POA: Diagnosis not present

## 2021-08-11 DIAGNOSIS — H16223 Keratoconjunctivitis sicca, not specified as Sjogren's, bilateral: Secondary | ICD-10-CM | POA: Diagnosis not present

## 2021-08-11 DIAGNOSIS — H0102A Squamous blepharitis right eye, upper and lower eyelids: Secondary | ICD-10-CM | POA: Diagnosis not present

## 2021-08-11 DIAGNOSIS — H401112 Primary open-angle glaucoma, right eye, moderate stage: Secondary | ICD-10-CM | POA: Diagnosis not present

## 2021-08-11 DIAGNOSIS — E119 Type 2 diabetes mellitus without complications: Secondary | ICD-10-CM | POA: Diagnosis not present

## 2021-08-11 DIAGNOSIS — H0102B Squamous blepharitis left eye, upper and lower eyelids: Secondary | ICD-10-CM | POA: Diagnosis not present

## 2021-08-12 DIAGNOSIS — M1712 Unilateral primary osteoarthritis, left knee: Secondary | ICD-10-CM | POA: Diagnosis not present

## 2021-08-12 DIAGNOSIS — M25562 Pain in left knee: Secondary | ICD-10-CM | POA: Diagnosis not present

## 2021-08-23 ENCOUNTER — Other Ambulatory Visit: Payer: Self-pay | Admitting: Internal Medicine

## 2021-08-27 DIAGNOSIS — L570 Actinic keratosis: Secondary | ICD-10-CM | POA: Diagnosis not present

## 2021-08-27 DIAGNOSIS — L820 Inflamed seborrheic keratosis: Secondary | ICD-10-CM | POA: Diagnosis not present

## 2021-08-27 DIAGNOSIS — L821 Other seborrheic keratosis: Secondary | ICD-10-CM | POA: Diagnosis not present

## 2021-08-27 DIAGNOSIS — D234 Other benign neoplasm of skin of scalp and neck: Secondary | ICD-10-CM | POA: Diagnosis not present

## 2021-09-02 ENCOUNTER — Other Ambulatory Visit: Payer: Self-pay | Admitting: Internal Medicine

## 2021-09-03 ENCOUNTER — Telehealth: Payer: Medicare Other

## 2021-09-05 ENCOUNTER — Other Ambulatory Visit: Payer: Self-pay | Admitting: Internal Medicine

## 2021-09-15 ENCOUNTER — Telehealth: Payer: Self-pay | Admitting: Internal Medicine

## 2021-09-15 NOTE — Telephone Encounter (Signed)
LVM for pt to rtn my call to schedule AWV with [facility]. Call back # [phone] 

## 2021-10-02 DIAGNOSIS — M79604 Pain in right leg: Secondary | ICD-10-CM | POA: Diagnosis not present

## 2021-10-07 DIAGNOSIS — M79604 Pain in right leg: Secondary | ICD-10-CM | POA: Diagnosis not present

## 2021-10-13 DIAGNOSIS — M79604 Pain in right leg: Secondary | ICD-10-CM | POA: Diagnosis not present

## 2021-10-15 ENCOUNTER — Encounter: Payer: Self-pay | Admitting: Internal Medicine

## 2021-10-15 NOTE — Progress Notes (Signed)
Subjective:    Patient ID: Tiffany Hood, female    DOB: 12/06/1940, 81 y.o.   MRN: [id number]     HPI Tiffany Hood is here for follow up of her chronic medical problems, including htn, hld, DM, OA  She is not exercising.  B/l knee pain  - needs L knee replacement  She fell recently - pain in right buttock and thigh - sees ortho this afternoon.   Doing PT. is improving.   Medications and allergies reviewed with patient and updated if appropriate.  Current Outpatient Medications on File Prior to Visit  Medication Sig Dispense Refill   Ascorbic Acid (VITAMIN C) 1000 MG tablet Take 500 mg by mouth daily.     Biotin w/ Vitamins C & E (HAIR SKIN & NAILS GUMMIES PO) Take 1 each by mouth daily.     Calcium Carbonate-Vitamin D (CALTRATE 600+D PO) Take 1 capsule by mouth in the morning and at bedtime.     cycloSPORINE (RESTASIS) 0.05 % ophthalmic emulsion Apply 1 drop to eye in the morning and at bedtime.     diclofenac Sodium (VOLTAREN) 1 % GEL Apply 2 g topically 4 (four) times daily as needed.     gabapentin (NEURONTIN) 100 MG capsule Take 200 mg by mouth at bedtime.     icosapent Ethyl (VASCEPA) 1 g capsule TAKE 2 CAPSULES BY MOUTH  TWICE DAILY 360 capsule 3   Lancets (ONETOUCH ULTRASOFT) lancets USE AS INSTRUCTED TO CHECK  BLOOD SUGARS DAILY 100 each 3   losartan (COZAAR) 50 MG tablet TAKE 1 TABLET BY MOUTH DAILY 90 tablet 3   Melatonin 5 MG TABS Take 5 mg by mouth daily.     meloxicam (MOBIC) 7.5 MG tablet TAKE 1 TABLET BY MOUTH  DAILY 100 tablet 2   metroNIDAZOLE (METROGEL) 0.75 % gel SMARTSIG:Gel Topical Daily     Multiple Vitamins-Minerals (PRESERVISION AREDS PO) Take by mouth.     ONE TOUCH ULTRA TEST test strip USE TO CHECK BLOOD SUGAR  DAILY 100 each 3   Polyethyl Glycol-Propyl Glycol 0.4-0.3 % SOLN Place 1 drop into both eyes daily as needed.     pravastatin (PRAVACHOL) 40 MG tablet TAKE 1 TABLET BY MOUTH  DAILY 100 tablet 2   triamterene-hydrochlorothiazide (MAXZIDE)  75-50 MG tablet TAKE ONE-HALF TABLET BY  MOUTH DAILY 45 tablet 3   Calcium Carbonate-Vit D-Min (CALTRATE 600+D PLUS MINERALS) 600-800 MG-UNIT CHEW SMARTSIG:1 By Mouth     meloxicam (MOBIC) 15 MG tablet Take by mouth.     Multiple Vitamin (MULTI VITAMIN) TABS SMARTSIG:1 By Mouth     No current facility-administered medications on file prior to visit.     Review of Systems  Constitutional:  Negative for chills and fever.  Respiratory:  Negative for cough, shortness of breath and wheezing.   Cardiovascular:  Negative for chest pain, palpitations and leg swelling.  Neurological:  Positive for headaches (rare). Negative for light-headedness.       Objective:   Vitals:   10/16/21 1123  BP: (!) 142/68  Pulse: 78  Temp: 98 F (36.7 C)  SpO2: 98%   BP Readings from Last 3 Encounters:  10/16/21 (!) 142/68  04/10/21 (!) 142/82  03/05/21 (!) 159/75   Wt Readings from Last 3 Encounters:  10/16/21 162 lb (73.5 kg)  04/10/21 160 lb 9.6 oz (72.8 kg)  03/05/21 160 lb 14.4 oz (73 kg)   Body mass index is 28.7 kg/m.    Physical Exam Constitutional:  General: She is not in acute distress.    Appearance: Normal appearance.  HENT:     Head: Normocephalic and atraumatic.  Eyes:     Conjunctiva/sclera: Conjunctivae normal.  Cardiovascular:     Rate and Rhythm: Normal rate and regular rhythm.     Heart sounds: Normal heart sounds. No murmur heard. Pulmonary:     Effort: Pulmonary effort is normal. No respiratory distress.     Breath sounds: Normal breath sounds. No wheezing.  Musculoskeletal:     Cervical back: Neck supple.     Right lower leg: No edema.     Left lower leg: No edema.  Lymphadenopathy:     Cervical: No cervical adenopathy.  Skin:    General: Skin is warm and dry.     Findings: No rash.  Neurological:     Mental Status: She is alert. Mental status is at baseline.  Psychiatric:        Mood and Affect: Mood normal.        Behavior: Behavior normal.         Lab Results  Component Value Date   WBC 8.6 04/10/2021   HGB 13.3 04/10/2021   HCT 40.9 04/10/2021   PLT 231.0 04/10/2021   GLUCOSE 86 04/10/2021   CHOL 207 (H) 04/10/2021   TRIG 318.0 (H) 04/10/2021   HDL 63.70 04/10/2021   LDLDIRECT 103.0 04/10/2021   [place]  10/09/2019     Comment:     . LDL cholesterol not calculated. Triglyceride levels greater than 400 mg/dL invalidate calculated LDL results. . Reference range: <100 . Desirable range <100 mg/dL for primary prevention;   <70 mg/dL for patients with CHD or diabetic patients  with > or = 2 CHD risk factors. Marland Kitchen LDL-C is now calculated using the Martin-Hopkins  calculation, which is a validated novel method providing  better accuracy than the Friedewald equation in the  estimation of LDL-C.  Cresenciano Genre et al. Annamaria Helling. 9449;675(91): 2061-2068  (http://education.[facility].com/faq/FAQ164)    ALT 15 04/10/2021   AST 16 04/10/2021   NA 138 04/10/2021   K 4.3 04/10/2021   CL 100 04/10/2021   CREATININE 0.62 04/10/2021   BUN 19 04/10/2021   CO2 29 04/10/2021   TSH 1.32 04/10/2021   INR 0.92 02/18/2015   HGBA1C 6.9 (H) 04/10/2021   MICROALBUR 1.0 08/14/2015     Assessment & Plan:    See Problem List for Assessment and Plan of chronic medical problems.

## 2021-10-15 NOTE — Patient Instructions (Addendum)
     Blood work was ordered.     Medications changes include :   none    An ultrasound of your thyroid was ordered.     Someone from that office will call you to schedule an appointment.    Return in about 6 months (around 04/18/2022) for Physical Exam.

## 2021-10-16 ENCOUNTER — Ambulatory Visit (INDEPENDENT_AMBULATORY_CARE_PROVIDER_SITE_OTHER): Payer: Medicare Other | Admitting: Internal Medicine

## 2021-10-16 VITALS — BP 142/68 | HR 78 | Temp 98.0°F | Ht 63.0 in | Wt 162.0 lb

## 2021-10-16 DIAGNOSIS — E119 Type 2 diabetes mellitus without complications: Secondary | ICD-10-CM

## 2021-10-16 DIAGNOSIS — M1711 Unilateral primary osteoarthritis, right knee: Secondary | ICD-10-CM | POA: Diagnosis not present

## 2021-10-16 DIAGNOSIS — M79604 Pain in right leg: Secondary | ICD-10-CM | POA: Diagnosis not present

## 2021-10-16 DIAGNOSIS — E042 Nontoxic multinodular goiter: Secondary | ICD-10-CM | POA: Diagnosis not present

## 2021-10-16 DIAGNOSIS — E7849 Other hyperlipidemia: Secondary | ICD-10-CM | POA: Diagnosis not present

## 2021-10-16 DIAGNOSIS — I10 Essential (primary) hypertension: Secondary | ICD-10-CM

## 2021-10-16 DIAGNOSIS — M545 Low back pain, unspecified: Secondary | ICD-10-CM | POA: Diagnosis not present

## 2021-10-16 LAB — MICROALBUMIN / CREATININE URINE RATIO
Creatinine,U: 21.7 mg/dL
Microalb Creat Ratio: 3.2 mg/g (ref 0.0–30.0)
Microalb, Ur: <0.7 mg/dL (ref 0.0–1.9)

## 2021-10-16 LAB — LIPID PANEL
Cholesterol: 231 mg/dL — ABNORMAL HIGH (ref 0–200)
HDL: 57.2 mg/dL (ref >39.00)
Total CHOL/HDL Ratio: 4
Triglycerides: 409 mg/dL — ABNORMAL HIGH (ref 0.0–149.0)

## 2021-10-16 LAB — COMPREHENSIVE METABOLIC PANEL
ALT: 22 U/L (ref 0–35)
AST: 21 U/L (ref 0–37)
Albumin: 4.4 g/dL (ref 3.5–5.2)
Alkaline Phosphatase: 59 U/L (ref 39–117)
BUN: 24 mg/dL — ABNORMAL HIGH (ref 6–23)
CO2: 28 mEq/L (ref 19–32)
Calcium: 9.8 mg/dL (ref 8.4–10.5)
Chloride: 99 mEq/L (ref 96–112)
Creatinine, Ser: 0.62 mg/dL (ref 0.40–1.20)
GFR: 84.58 mL/min (ref >60.00)
Glucose, Bld: 96 mg/dL (ref 70–99)
Potassium: 4.2 mEq/L (ref 3.5–5.1)
Sodium: 137 mEq/L (ref 135–145)
Total Bilirubin: 0.4 mg/dL (ref 0.2–1.2)
Total Protein: 7.4 g/dL (ref 6.0–8.3)

## 2021-10-16 LAB — LDL CHOLESTEROL, DIRECT: Direct LDL: 104 mg/dL

## 2021-10-16 LAB — HEMOGLOBIN A1C: Hgb A1c MFr Bld: 6.6 % — ABNORMAL HIGH (ref 4.6–6.5)

## 2021-10-16 NOTE — Assessment & Plan Note (Signed)
Chronic Check lipid panel  Continue pravastatin 40 mg daily Regular exercise and healthy diet encouraged  

## 2021-10-16 NOTE — Assessment & Plan Note (Signed)
Chronic  Lab Results  Component Value Date   HGBA1C 6.9 (H) 04/10/2021   Sugars  controlled Check A1c, urine microalbumin today Continue diet control Stressed regular exercise, diabetic diet

## 2021-10-16 NOTE — Assessment & Plan Note (Signed)
Chronic Multiple joints Continue meloxicam, gabapentin

## 2021-10-16 NOTE — Assessment & Plan Note (Addendum)
Chronic BP adequate controlled Continue maxide 75-50 mg - 1/2 tab daily cmp

## 2021-10-16 NOTE — Assessment & Plan Note (Signed)
Chronic Due for [place] - ordered  tsh

## 2021-10-19 ENCOUNTER — Encounter: Payer: Self-pay | Admitting: Internal Medicine

## 2021-10-20 DIAGNOSIS — M79604 Pain in right leg: Secondary | ICD-10-CM | POA: Diagnosis not present

## 2021-10-22 DIAGNOSIS — M79604 Pain in right leg: Secondary | ICD-10-CM | POA: Diagnosis not present

## 2021-10-26 DIAGNOSIS — M79604 Pain in right leg: Secondary | ICD-10-CM | POA: Diagnosis not present

## 2021-10-27 ENCOUNTER — Ambulatory Visit
Admission: RE | Admit: 2021-10-27 | Discharge: 2021-10-27 | Disposition: A | Discharge status: Home / Self Care | Payer: Medicare Other | Source: Ambulatory Visit | Attending: Internal Medicine | Admitting: Internal Medicine

## 2021-10-27 DIAGNOSIS — E042 Nontoxic multinodular goiter: Secondary | ICD-10-CM

## 2021-10-28 DIAGNOSIS — M79604 Pain in right leg: Secondary | ICD-10-CM | POA: Diagnosis not present

## 2021-11-18 ENCOUNTER — Other Ambulatory Visit: Payer: Self-pay | Admitting: *Deleted

## 2021-11-18 DIAGNOSIS — M8580 Other specified disorders of bone density and structure, unspecified site: Secondary | ICD-10-CM

## 2021-11-18 DIAGNOSIS — Z853 Personal history of malignant neoplasm of breast: Secondary | ICD-10-CM

## 2021-11-27 ENCOUNTER — Other Ambulatory Visit: Payer: Self-pay | Admitting: Internal Medicine

## 2021-11-27 DIAGNOSIS — Z1231 Encounter for screening mammogram for malignant neoplasm of breast: Secondary | ICD-10-CM

## 2021-12-14 ENCOUNTER — Ambulatory Visit
Admission: RE | Admit: 2021-12-14 | Discharge: 2021-12-14 | Disposition: A | Discharge status: Home / Self Care | Payer: Medicare Other | Source: Ambulatory Visit | Attending: Internal Medicine | Admitting: Internal Medicine

## 2021-12-14 DIAGNOSIS — Z1231 Encounter for screening mammogram for malignant neoplasm of breast: Secondary | ICD-10-CM | POA: Diagnosis not present

## 2021-12-18 DIAGNOSIS — H0102B Squamous blepharitis left eye, upper and lower eyelids: Secondary | ICD-10-CM | POA: Diagnosis not present

## 2021-12-18 DIAGNOSIS — H0102A Squamous blepharitis right eye, upper and lower eyelids: Secondary | ICD-10-CM | POA: Diagnosis not present

## 2021-12-18 DIAGNOSIS — E119 Type 2 diabetes mellitus without complications: Secondary | ICD-10-CM | POA: Diagnosis not present

## 2021-12-18 DIAGNOSIS — H401121 Primary open-angle glaucoma, left eye, mild stage: Secondary | ICD-10-CM | POA: Diagnosis not present

## 2021-12-18 DIAGNOSIS — H401112 Primary open-angle glaucoma, right eye, moderate stage: Secondary | ICD-10-CM | POA: Diagnosis not present

## 2021-12-18 DIAGNOSIS — H2513 Age-related nuclear cataract, bilateral: Secondary | ICD-10-CM | POA: Diagnosis not present

## 2021-12-18 LAB — HM DIABETES EYE EXAM

## 2021-12-21 ENCOUNTER — Encounter: Payer: Self-pay | Admitting: Internal Medicine

## 2021-12-21 NOTE — Progress Notes (Signed)
Outside notes received. Information abstracted. Notes sent to scan.  

## 2021-12-29 ENCOUNTER — Telehealth: Payer: Medicare Other

## 2022-01-05 ENCOUNTER — Telehealth: Payer: Self-pay | Admitting: Adult Health

## 2022-01-05 NOTE — Telephone Encounter (Signed)
Rescheduled appointment per provider template. Patient is aware of the changes made to her upcoming appointment 

## 2022-01-08 DIAGNOSIS — M1712 Unilateral primary osteoarthritis, left knee: Secondary | ICD-10-CM | POA: Diagnosis not present

## 2022-01-13 ENCOUNTER — Telehealth: Payer: Self-pay | Admitting: Internal Medicine

## 2022-01-13 NOTE — Telephone Encounter (Signed)
Left message for patient to call back and schedule Medicare Annual Wellness Visit (AWV).   Please offer to do virtually or by telephone.  Left office number and my jabber [phone].  Last AWV:07/21/2020  Please schedule at anytime with Nurse Health Advisor.

## 2022-02-23 ENCOUNTER — Telehealth: Payer: Self-pay | Admitting: Adult Health

## 2022-02-23 NOTE — Telephone Encounter (Signed)
Rescheduled appointment per provider template. Patient is aware of the changes made to her upcoming appointment 

## 2022-03-05 ENCOUNTER — Inpatient Hospital Stay: Payer: Medicare Other | Attending: Adult Health | Admitting: Adult Health

## 2022-03-05 ENCOUNTER — Other Ambulatory Visit: Payer: Self-pay

## 2022-03-05 VITALS — BP 136/70 | HR 89 | Temp 97.7°F | Resp 16 | Wt 160.7 lb

## 2022-03-05 DIAGNOSIS — Z853 Personal history of malignant neoplasm of breast: Secondary | ICD-10-CM | POA: Insufficient documentation

## 2022-03-05 DIAGNOSIS — Z08 Encounter for follow-up examination after completed treatment for malignant neoplasm: Secondary | ICD-10-CM | POA: Diagnosis not present

## 2022-03-05 NOTE — Progress Notes (Signed)
[facility] Follow up:    Tiffany Rail, MD [place] [address]   DIAGNOSIS: History of breast cancer  SUMMARY OF ONCOLOGIC HISTORY: Per Dr. Geralyn Flash note from 01/30/2015: #1 T1c N0 breast cancer status post lumpectomy 05/07/2002. She went on to complete radiation therapy adjuvantly on 08/19/2012.   #2 patient took Arimidex 1 mg daily from June 2004 to June 2014.  CURRENT THERAPY: Observation  INTERVAL HISTORY: Tiffany Hood 81 y.o. female returns for follow-up of her history of breast cancer.  Her most recent mammogram was completed on December 15, 2021.  Demonstrated no mammographic evidence of malignancy and breast density category B.  She completed 10 years of antiestrogen therapy with anastrozole from 2004-2014 and is scheduled for bone density testing on May 10, 2022.  She is doing well today.     Patient Active Problem List   Diagnosis Date Noted   Glaucoma 04/10/2021   Osteoarthritis of left knee 04/10/2021   Multiple thyroid nodules --stable for 5 years-no follow-up needed 08/12/2016   Diabetes ([place]) 08/08/2015   Spinal stenosis, lumbar region, 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  MEDICAL HISTORY: Past Medical History:  Diagnosis Date   Breast cancer ([place]) left-2004 sx   Diabetes mellitus    TYPE 2   Diverticulitis    09/14/12   Diverticulosis    Heart murmur    History of radiation therapy    History of urinary tract infection    Hyperlipidemia    Hypertension    Osteoporosis    Fosamax Rxed 2/13   Personal history of colonic adenomas 11/15/2012   Personal history of radiation therapy 06/2002   Pneumonia     2013   Seasonal allergies    Tingling    right leg     SURGICAL HISTORY: Past Surgical History:  Procedure Laterality Date   ABDOMINAL HYSTERECTOMY  1965   ovaries remain; for dysfunctional menses; ? endometriosis   ABDOMINAL SURGERY     BREAST BIOPSY Left 04/16/2002   malignant   BREAST LUMPECTOMY Left    colon polyps  2014   Dr Carlean Purl   COLONOSCOPY  2004    Tics   DILATION AND CURETTAGE OF UTERUS     LAPAROSCOPIC SIGMOID COLECTOMY  04/13/2013   DR [place] N/A 04/13/2013   Procedure: LAPAROSCOPIC ASSISTED SIGMOID COLECTOMY;  Surgeon: Merrie Roof, MD;  Location: [facility];  Service: General;  Laterality: N/A;   LUMBAR LAMINECTOMY/DECOMPRESSION MICRODISCECTOMY N/A 02/19/2015   Procedure: MICRODISCECTOMY AT L4-L5, CENTRAL DECOMPRESSION L4-L5 FOR SPINAL STENOSIS, FORAMINOTOMY FOR L4 L5 ROOT ON RIGHT;  Surgeon: Latanya Maudlin, MD;  Location: [facility];  Service: Orthopedics;  Laterality: N/A;   removal bone spur Left 2/14   2nd finger   TONSILLECTOMY AND ADENOIDECTOMY      SOCIAL HISTORY: Social History   Socioeconomic History   Marital status: Widowed    Spouse name: Not on file   Number of children: Not on file   Years of education: Not on file   Highest education level: Not on file  Occupational History   Not on file  Tobacco Use   Smoking status: Never   Smokeless  tobacco: Never  Vaping Use   Vaping Use: Never used  Substance and Sexual Activity   Alcohol use: No    Alcohol/week: 0.0 standard drinks of alcohol   Drug use: No   Sexual activity: Not on file  Other Topics Concern   Not on file  Social History Narrative   Exercise: no   Social Determinants of Health   Financial Resource Strain: Low Risk  (07/21/2020)   Overall Financial Resource Strain (CARDIA)    Difficulty of Paying Living Expenses: Not hard at all  Food Insecurity: No Food Insecurity (07/21/2020)   Hunger Vital Sign    Worried About Running Out of Food in the  Last Year: Never true    Ran Out of Food in the Last Year: Never true  Transportation Needs: No Transportation Needs (07/21/2020)   PRAPARE - Hydrologist (Medical): No    Lack of Transportation (Non-Medical): No  Physical Activity: Sufficiently Active (07/21/2020)   Exercise Vital Sign    Days of Exercise per Week: 5 days    Minutes of Exercise per Session: 30 min  Stress: No Stress Concern Present (07/21/2020)   [place]    Feeling of Stress : Not at all  Social Connections: Moderately Integrated (07/21/2020)   Social Connection and Isolation Panel [NHANES]    Frequency of Communication with Friends and Family: More than three times a week    Frequency of Social Gatherings with Friends and Family: More than three times a week    Attends Religious Services: More than 4 times per year    Active Member of Clubs or Organizations: No    Attends Music therapist: More than 4 times per year    Marital Status: Widowed  Human resources officer Violence: Not on file    FAMILY HISTORY: Family History  Problem Relation Age of Onset   Stroke Mother 40   Hypertension Mother    Lung cancer Father        smoker   Cancer - Lung Father    Hypertension Brother    Heart disease Brother 30   Stroke Brother 7   Hypertension Brother    Hypertension Sister    Diabetes Neg Hx    Colon cancer Neg Hx    Esophageal cancer Neg Hx    Rectal cancer Neg Hx    Stomach cancer Neg Hx    Colon polyps Neg Hx     Review of Systems  Constitutional:  Negative for appetite change, chills, fatigue, fever and unexpected weight change.  HENT:   Negative for hearing loss, lump/mass and trouble swallowing.   Eyes:  Negative for eye problems and icterus.  Respiratory:  Negative for chest tightness, cough and shortness of breath.   Cardiovascular:  Negative for chest pain, leg swelling and palpitations.   Gastrointestinal:  Negative for abdominal distention, abdominal pain, constipation, diarrhea, nausea and vomiting.  Endocrine: Negative for hot flashes.  Genitourinary:  Negative for difficulty urinating.   Musculoskeletal:  Negative for arthralgias.  Skin:  Negative for itching and rash.  Neurological:  Negative for dizziness, extremity weakness, headaches and numbness.  Hematological:  Negative for adenopathy. Does not bruise/bleed easily.  Psychiatric/Behavioral:  Negative for depression. The patient is not nervous/anxious.       PHYSICAL EXAMINATION  ECOG PERFORMANCE STATUS: 0 - Asymptomatic  Vitals:   03/05/22 1114 03/05/22 1140  BP: (!) 144/83 136/70  Pulse: 89  Resp: 16   Temp: 97.7 F (36.5 C)   SpO2: 97%     Physical Exam Constitutional:      General: She is not in acute distress.    Appearance: Normal appearance. She is not toxic-appearing.  HENT:     Head: Normocephalic and atraumatic.  Eyes:     General: No scleral icterus. Cardiovascular:     Rate and Rhythm: Normal rate and regular rhythm.     Pulses: Normal pulses.     Heart sounds: Normal heart sounds.  Pulmonary:     Effort: Pulmonary effort is normal.     Breath sounds: Normal breath sounds.  Chest:     Comments: Left breast status postlumpectomy and radiation no sign of local recurrence right breast benign Abdominal:     General: Abdomen is flat. Bowel sounds are normal. There is no distension.     Palpations: Abdomen is soft.     Tenderness: There is no abdominal tenderness.  Musculoskeletal:        General: No swelling.     Cervical back: Neck supple.  Lymphadenopathy:     Cervical: No cervical adenopathy.  Skin:    General: Skin is warm and dry.     Findings: No rash.  Neurological:     General: No focal deficit present.     Mental Status: She is alert.  Psychiatric:        Mood and Affect: Mood normal.        Behavior: Behavior normal.     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  Tiffany Hood is doing well today.  She has no clinical or radiographic signs of breast cancer recurrence.  She will continue with annual mammograms next due in September 2024.  We discussed her health maintenance including the recommendation for her to continue to follow-up with her primary care provider annually and stay up-to-date with her vaccinations and skin exams.  We discussed healthy diet and exercise.  She will return in 1 year for continued long-term surveillance and follow-up.   All questions were answered. The patient knows to call the clinic with any problems, questions or concerns. We can certainly see the patient much sooner if necessary.  Total encounter time:30 minutes*in face-to-face visit time, chart review, lab review, care coordination, order entry, and documentation of the encounter time.    Wilber Bihari, NP 03/09/22 8:59 AM Medical Oncology and Hematology [facility] [place], [place] [address] Tel. [phone]    Fax. [phone]  *Total Encounter Time as defined by the Centers for Medicare and Medicaid Services includes, in addition to the face-to-face time of a patient visit (documented in the note above) non-face-to-face time: obtaining and reviewing outside history, ordering and reviewing medications, tests or procedures, care coordination (communications with other health care professionals or caregivers) and documentation in the medical record.

## 2022-03-09 NOTE — Assessment & Plan Note (Signed)
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  Tiffany Hood is doing well today.  She has no clinical or radiographic signs of breast cancer recurrence.  She will continue with annual mammograms next due in September 2024.  We discussed her health maintenance including the recommendation for her to continue to follow-up with her primary care provider annually and stay up-to-date with her vaccinations and skin exams.  We discussed healthy diet and exercise.  She will return in 1 year for continued long-term surveillance and follow-up.

## 2022-03-26 DIAGNOSIS — M1712 Unilateral primary osteoarthritis, left knee: Secondary | ICD-10-CM | POA: Diagnosis not present

## 2022-04-13 DIAGNOSIS — E119 Type 2 diabetes mellitus without complications: Secondary | ICD-10-CM | POA: Diagnosis not present

## 2022-04-13 DIAGNOSIS — H0102A Squamous blepharitis right eye, upper and lower eyelids: Secondary | ICD-10-CM | POA: Diagnosis not present

## 2022-04-13 DIAGNOSIS — H401121 Primary open-angle glaucoma, left eye, mild stage: Secondary | ICD-10-CM | POA: Diagnosis not present

## 2022-04-13 DIAGNOSIS — H0102B Squamous blepharitis left eye, upper and lower eyelids: Secondary | ICD-10-CM | POA: Diagnosis not present

## 2022-04-13 DIAGNOSIS — H2513 Age-related nuclear cataract, bilateral: Secondary | ICD-10-CM | POA: Diagnosis not present

## 2022-04-13 DIAGNOSIS — H401112 Primary open-angle glaucoma, right eye, moderate stage: Secondary | ICD-10-CM | POA: Diagnosis not present

## 2022-04-14 ENCOUNTER — Telehealth: Payer: Self-pay | Admitting: Internal Medicine

## 2022-04-14 NOTE — Telephone Encounter (Signed)
For our records:  We have received Pre-Op PW for the pt from [facility] and it has been placed in Dr. Quay Burow Boxes.   Upon completion please fax to:  [phone]

## 2022-04-14 NOTE — Telephone Encounter (Signed)
Patient has appointment on Monday.

## 2022-04-18 NOTE — Patient Instructions (Addendum)
      Blood work was ordered.   The lab is on the first floor.   An EKG was done today.    Medications changes include : None      Return in about 6 months (around 10/18/2022) for Physical Exam.

## 2022-04-18 NOTE — Progress Notes (Signed)
Subjective:    Patient ID: Tiffany Hood, female    DOB: 1940/11/16, 82 y.o.   MRN: [id number]     HPI Amadi is here for follow up of her chronic medical problems, including htn, hld, DM, OA and here for surgical clearance.  Finley is here for pre-operative clearance at the request of Dr Paralee Cancel for Left knee replacement with spinal anesthesia scheduled for 06/01/22.   Kynslie denies any personal or family history of problems with anesthesia or bleeding/blood clot problems.     Obdulia is exercising regularly.  With their daily activities they denies chest pain, palpitations, SOB and lightheadedness.        Medications and allergies reviewed with patient and updated if appropriate.  Current Outpatient Medications on File Prior to Visit  Medication Sig Dispense Refill   Ascorbic Acid (VITAMIN C) 1000 MG tablet Take 500 mg by mouth daily.     Biotin w/ Vitamins C & E (HAIR SKIN & NAILS GUMMIES PO) Take 1 each by mouth daily.     Calcium Carbonate-Vit D-Min (CALTRATE 600+D PLUS MINERALS) 600-800 MG-UNIT CHEW SMARTSIG:1 By Mouth     cycloSPORINE (RESTASIS) 0.05 % ophthalmic emulsion Apply 1 drop to eye in the morning and at bedtime.     diclofenac Sodium (VOLTAREN) 1 % GEL Apply 2 g topically 4 (four) times daily as needed.     icosapent Ethyl (VASCEPA) 1 g capsule TAKE 2 CAPSULES BY MOUTH  TWICE DAILY 360 capsule 3   Lancets (ONETOUCH ULTRASOFT) lancets USE AS INSTRUCTED TO CHECK  BLOOD SUGARS DAILY 100 each 3   losartan (COZAAR) 50 MG tablet TAKE 1 TABLET BY MOUTH DAILY 90 tablet 3   Melatonin 5 MG TABS Take 5 mg by mouth daily.     meloxicam (MOBIC) 7.5 MG tablet TAKE 1 TABLET BY MOUTH  DAILY 100 tablet 2   metroNIDAZOLE (METROGEL) 0.75 % gel SMARTSIG:Gel Topical Daily     Multiple Vitamin (MULTI VITAMIN) TABS SMARTSIG:1 By Mouth     ONE TOUCH ULTRA TEST test strip USE TO CHECK BLOOD SUGAR  DAILY 100 each 3   Polyethyl Glycol-Propyl Glycol 0.4-0.3 % SOLN Place 1 drop  into both eyes daily as needed.     pravastatin (PRAVACHOL) 40 MG tablet TAKE 1 TABLET BY MOUTH  DAILY 100 tablet 2   triamterene-hydrochlorothiazide (MAXZIDE) 75-50 MG tablet TAKE ONE-HALF TABLET BY  MOUTH DAILY 45 tablet 3   No current facility-administered medications on file prior to visit.     Review of Systems  Constitutional:  Negative for chills and fever.  Respiratory:  Negative for cough, shortness of breath and wheezing.   Cardiovascular:  Negative for chest pain, palpitations and leg swelling.  Gastrointestinal:  Negative for abdominal pain, blood in stool, constipation, diarrhea and nausea.  Genitourinary:  Negative for dysuria and hematuria.  Musculoskeletal:  Positive for arthralgias and back pain.  Skin:  Negative for rash.  Neurological:  Positive for headaches (rare). Negative for weakness, light-headedness and numbness.       Objective:   Vitals:   04/19/22 1128  BP: 130/78  Pulse: 82  Temp: 98 F (36.7 C)  SpO2: 96%   BP Readings from Last 3 Encounters:  04/19/22 130/78  03/05/22 136/70  10/16/21 (!) 142/68   Wt Readings from Last 3 Encounters:  04/19/22 154 lb (69.9 kg)  03/05/22 160 lb 11.2 oz (72.9 kg)  10/16/21 162 lb (73.5 kg)   Body mass index  is 27.28 kg/m.    Physical Exam Constitutional:      General: She is not in acute distress.    Appearance: Normal appearance.  HENT:     Head: Normocephalic and atraumatic.  Eyes:     Conjunctiva/sclera: Conjunctivae normal.  Cardiovascular:     Rate and Rhythm: Normal rate and regular rhythm.     Heart sounds: Normal heart sounds. No murmur heard. Pulmonary:     Effort: Pulmonary effort is normal. No respiratory distress.     Breath sounds: Normal breath sounds. No wheezing.  Musculoskeletal:     Cervical back: Neck supple.     Right lower leg: No edema.     Left lower leg: No edema.  Lymphadenopathy:     Cervical: No cervical adenopathy.  Skin:    General: Skin is warm and dry.      Findings: No rash.  Neurological:     Mental Status: She is alert. Mental status is at baseline.  Psychiatric:        Mood and Affect: Mood normal.        Behavior: Behavior normal.        Lab Results  Component Value Date   WBC 8.6 04/10/2021   HGB 13.3 04/10/2021   HCT 40.9 04/10/2021   PLT 231.0 04/10/2021   GLUCOSE 96 10/16/2021   CHOL 231 (H) 10/16/2021   TRIG (H) 10/16/2021    409.0 Triglyceride is over 400; calculations on Lipids are invalid.   HDL 57.20 10/16/2021   LDLDIRECT 104.0 10/16/2021   [place]  10/09/2019     Comment:     . LDL cholesterol not calculated. Triglyceride levels greater than 400 mg/dL invalidate calculated LDL results. . Reference range: <100 . Desirable range <100 mg/dL for primary prevention;   <70 mg/dL for patients with CHD or diabetic patients  with > or = 2 CHD risk factors. Marland Kitchen LDL-C is now calculated using the Martin-Hopkins  calculation, which is a validated novel method providing  better accuracy than the Friedewald equation in the  estimation of LDL-C.  Cresenciano Genre et al. Annamaria Helling. 3810;175(10): 2061-2068  (http://education.[facility].com/faq/FAQ164)    ALT 22 10/16/2021   AST 21 10/16/2021   NA 137 10/16/2021   K 4.2 10/16/2021   CL 99 10/16/2021   CREATININE 0.62 10/16/2021   BUN 24 (H) 10/16/2021   CO2 28 10/16/2021   TSH 1.32 04/10/2021   INR 0.92 02/18/2015   HGBA1C 6.6 (H) 10/16/2021   MICROALBUR <0.7 10/16/2021     Assessment & Plan:    See Problem List for Assessment and Plan of chronic medical problems.

## 2022-04-19 ENCOUNTER — Encounter: Payer: Self-pay | Admitting: Internal Medicine

## 2022-04-19 ENCOUNTER — Ambulatory Visit (INDEPENDENT_AMBULATORY_CARE_PROVIDER_SITE_OTHER): Payer: Medicare Other | Admitting: Internal Medicine

## 2022-04-19 VITALS — BP 130/78 | HR 82 | Temp 98.0°F | Ht 63.0 in | Wt 154.0 lb

## 2022-04-19 DIAGNOSIS — I10 Essential (primary) hypertension: Secondary | ICD-10-CM

## 2022-04-19 DIAGNOSIS — Z01818 Encounter for other preprocedural examination: Secondary | ICD-10-CM

## 2022-04-19 DIAGNOSIS — E7849 Other hyperlipidemia: Secondary | ICD-10-CM

## 2022-04-19 DIAGNOSIS — M1712 Unilateral primary osteoarthritis, left knee: Secondary | ICD-10-CM | POA: Diagnosis not present

## 2022-04-19 DIAGNOSIS — E119 Type 2 diabetes mellitus without complications: Secondary | ICD-10-CM

## 2022-04-19 LAB — COMPREHENSIVE METABOLIC PANEL
ALT: 18 U/L (ref 0–35)
AST: 19 U/L (ref 0–37)
Albumin: 4.6 g/dL (ref 3.5–5.2)
Alkaline Phosphatase: 61 U/L (ref 39–117)
BUN: 22 mg/dL (ref 6–23)
CO2: 31 mEq/L (ref 19–32)
Calcium: 10.7 mg/dL — ABNORMAL HIGH (ref 8.4–10.5)
Chloride: 98 mEq/L (ref 96–112)
Creatinine, Ser: 0.63 mg/dL (ref 0.40–1.20)
GFR: 83.96 mL/min (ref >60.00)
Glucose, Bld: 86 mg/dL (ref 70–99)
Potassium: 4.1 mEq/L (ref 3.5–5.1)
Sodium: 138 mEq/L (ref 135–145)
Total Bilirubin: 0.4 mg/dL (ref 0.2–1.2)
Total Protein: 7.7 g/dL (ref 6.0–8.3)

## 2022-04-19 LAB — CBC WITH DIFFERENTIAL/PLATELET
Basophils Absolute: 0.1 10*3/uL (ref 0.0–0.1)
Basophils Relative: 1.1 % (ref 0.0–3.0)
Eosinophils Absolute: 0.3 10*3/uL (ref 0.0–0.7)
Eosinophils Relative: 3.7 % (ref 0.0–5.0)
HCT: 43.3 % (ref 36.0–46.0)
Hemoglobin: 14.3 g/dL (ref 12.0–15.0)
Lymphocytes Relative: 26.4 % (ref 12.0–46.0)
Lymphs Abs: 2.3 10*3/uL (ref 0.7–4.0)
MCHC: 33 g/dL (ref 30.0–36.0)
MCV: 89.3 fl (ref 78.0–100.0)
Monocytes Absolute: 0.8 10*3/uL (ref 0.1–1.0)
Monocytes Relative: 9.1 % (ref 3.0–12.0)
Neutro Abs: 5.3 10*3/uL (ref 1.4–7.7)
Neutrophils Relative %: 59.7 % (ref 43.0–77.0)
Platelets: 259 10*3/uL (ref 150.0–400.0)
RBC: 4.85 Mil/uL (ref 3.87–5.11)
RDW: 14.8 % (ref 11.5–15.5)
WBC: 8.9 10*3/uL (ref 4.0–10.5)

## 2022-04-19 LAB — LIPID PANEL
Cholesterol: 231 mg/dL — ABNORMAL HIGH (ref 0–200)
HDL: 64.9 mg/dL (ref >39.00)
Total CHOL/HDL Ratio: 4
Triglycerides: 442 mg/dL — ABNORMAL HIGH (ref 0.0–149.0)

## 2022-04-19 LAB — HEMOGLOBIN A1C: Hgb A1c MFr Bld: 6.5 % (ref 4.6–6.5)

## 2022-04-19 LAB — LDL CHOLESTEROL, DIRECT: Direct LDL: 99 mg/dL

## 2022-04-19 NOTE — Assessment & Plan Note (Signed)
Chronic Following with orthopedics Conservative treatment has failed Also here for preop for left knee replacement

## 2022-04-19 NOTE — Assessment & Plan Note (Addendum)
Chronic Blood pressure well controlled CMP Continue triamterene-HCTZ 75-50 mg tablet-1/2 tablet daily, losartan 50 mg daily EKG today

## 2022-04-19 NOTE — Assessment & Plan Note (Signed)
Here for preop evaluation at the request of Dr Alvan Dame For left knee replacement with spinal anesthesia scheduled for 3/5 Chronic medical problems are stable on current medications No concerning symptoms suggestive of CAD or pulmonary disease EKG NSR at 80 bpm, normal EKG.  No significant change compared to EKG from 2016 which was her last EKG. Will check routine blood work today Low risk for low risk procedure No further evaluation needed prior to surgery

## 2022-04-19 NOTE — Assessment & Plan Note (Signed)
Chronic Regular exercise and healthy diet encouraged Check lipid panel  Continue pravastatin 40 mg daily

## 2022-04-19 NOTE — Assessment & Plan Note (Signed)
Chronic   Lab Results  Component Value Date   HGBA1C 6.6 (H) 10/16/2021   Sugars well controlled Check A1c Continue Lifestyle control Stressed regular exercise, diabetic diet

## 2022-04-21 ENCOUNTER — Encounter: Payer: Self-pay | Admitting: Internal Medicine

## 2022-04-25 ENCOUNTER — Encounter: Payer: Self-pay | Admitting: Internal Medicine

## 2022-04-25 DIAGNOSIS — E7849 Other hyperlipidemia: Secondary | ICD-10-CM

## 2022-04-26 MED ORDER — FENOFIBRATE 145 MG PO TABS: 145.0000 mg | ORAL_TABLET | Freq: Every day | ORAL | 5 refills | Status: DC

## 2022-05-03 ENCOUNTER — Other Ambulatory Visit: Payer: Self-pay | Admitting: Internal Medicine

## 2022-05-03 ENCOUNTER — Other Ambulatory Visit: Payer: Self-pay

## 2022-05-10 ENCOUNTER — Ambulatory Visit
Admission: RE | Admit: 2022-05-10 | Discharge: 2022-05-10 | Disposition: A | Discharge status: Home / Self Care | Payer: Medicare Other | Source: Ambulatory Visit | Attending: Hematology and Oncology | Admitting: Hematology and Oncology

## 2022-05-10 DIAGNOSIS — Z853 Personal history of malignant neoplasm of breast: Secondary | ICD-10-CM

## 2022-05-10 DIAGNOSIS — M8589 Other specified disorders of bone density and structure, multiple sites: Secondary | ICD-10-CM | POA: Diagnosis not present

## 2022-05-10 DIAGNOSIS — Z780 Asymptomatic menopausal state: Secondary | ICD-10-CM | POA: Diagnosis not present

## 2022-05-10 DIAGNOSIS — M8580 Other specified disorders of bone density and structure, unspecified site: Secondary | ICD-10-CM

## 2022-05-18 NOTE — Progress Notes (Signed)
COVID Vaccine Completed: yes  Date of COVID positive in last 90 days:  PCP - Billey Gosling, MD Cardiologist -   Medical clearance by Billey Gosling 04/19/22 in Epic  Chest x-ray -  EKG - 04/19/22 Epic Stress Test -  ECHO -  Cardiac Cath -  Pacemaker/ICD device last checked: Spinal Cord Stimulator:  Bowel Prep -   Sleep Study -  CPAP -   Fasting Blood Sugar -  Checks Blood Sugar _____ times a day  Last dose of GLP1 agonist-  N/A GLP1 instructions:  N/A   Last dose of SGLT-2 inhibitors-  N/A SGLT-2 instructions: N/A   Blood Thinner Instructions: Aspirin Instructions: Last Dose:  Activity level:  Can go up a flight of stairs and perform activities of daily living without stopping and without symptoms of chest pain or shortness of breath.  Able to exercise without symptoms  Unable to go up a flight of stairs without symptoms of     Anesthesia review: heart murmur, HTN, DM  Patient denies shortness of breath, fever, cough and chest pain at PAT appointment  Patient verbalized understanding of instructions that were given to them at the PAT appointment. Patient was also instructed that they will need to review over the PAT instructions again at home before surgery.

## 2022-05-18 NOTE — Patient Instructions (Signed)
SURGICAL WAITING ROOM VISITATION  Patients having surgery or a procedure may have no more than 2 support people in the waiting area - these visitors may rotate.    Children under the age of 2 must have an adult with them who is not the patient.  Due to an increase in RSV and influenza rates and associated hospitalizations, children ages 49 and under may not visit patients in [place].  If the patient needs to stay at the hospital during part of their recovery, the visitor guidelines for inpatient rooms apply. Pre-op nurse will coordinate an appropriate time for 1 support person to accompany patient in pre-op.  This support person may not rotate.    Please refer to the [facility] website for the visitor guidelines for Inpatients (after your surgery is over and you are in a regular room).     Your procedure is scheduled on: 06/01/22   Report to [facility] Main Entrance    Report to admitting at 5:15 AM   Call this number if you have problems the morning of surgery [phone]   Do not eat food :After Midnight.   After Midnight you may have the following liquids until 4:15 AM DAY OF SURGERY  Water Non-Citrus Juices (without pulp, NO RED-Apple, White grape, White cranberry) Black Coffee (NO MILK/CREAM OR CREAMERS, sugar ok)  Clear Tea (NO MILK/CREAM OR CREAMERS, sugar ok) regular and decaf                             Plain Jell-O (NO RED)                                           Fruit ices (not with fruit pulp, NO RED)                                     Popsicles (NO RED)                                                               Sports drinks like Gatorade (NO RED)               The day of surgery:  Drink ONE (1) Pre-Surgery G2 at 4:15 AM the morning of surgery. Drink in one sitting. Do not sip.  This drink was given to you during your hospital  pre-op appointment visit. Nothing else to drink after completing the  Pre-Surgery G2.          If you have  questions, please contact your surgeon's office.   FOLLOW BOWEL PREP AND ANY ADDITIONAL PRE OP INSTRUCTIONS YOU RECEIVED FROM YOUR SURGEON'S OFFICE!!!     Oral Hygiene is also important to reduce your risk of infection.                                    Remember - BRUSH YOUR TEETH THE MORNING OF SURGERY WITH YOUR REGULAR TOOTHPASTE  DENTURES WILL BE REMOVED PRIOR  TO SURGERY PLEASE DO NOT APPLY "Poly grip" OR ADHESIVES!!!   Take these medicines the morning of surgery with A SIP OF WATER: Fenofibrate, Pravastatin   DO NOT TAKE ANY ORAL DIABETIC MEDICATIONS DAY OF YOUR SURGERY  How to Manage Your Diabetes Before and After Surgery  Why is it important to control my blood sugar before and after surgery? Improving blood sugar levels before and after surgery helps healing and can limit problems. A way of improving blood sugar control is eating a healthy diet by:  Eating less sugar and carbohydrates  Increasing activity/exercise  Talking with your doctor about reaching your blood sugar goals High blood sugars (greater than 180 mg/dL) can raise your risk of infections and slow your recovery, so you will need to focus on controlling your diabetes during the weeks before surgery. Make sure that the doctor who takes care of your diabetes knows about your planned surgery including the date and location.  How do I manage my blood sugar before surgery? Check your blood sugar at least 4 times a day, starting 2 days before surgery, to make sure that the level is not too high or low. Check your blood sugar the morning of your surgery when you wake up and every 2 hours until you get to the Short Stay unit. If your blood sugar is less than 70 mg/dL, you will need to treat for low blood sugar: Do not take insulin. Treat a low blood sugar (less than 70 mg/dL) with  cup of clear juice (cranberry or apple), 4 glucose tablets, OR glucose gel. Recheck blood sugar in 15 minutes after treatment (to make sure  it is greater than 70 mg/dL). If your blood sugar is not greater than 70 mg/dL on recheck, call [phone] for further instructions. Report your blood sugar to the short stay nurse when you get to Short Stay.  If you are admitted to the hospital after surgery: Your blood sugar will be checked by the staff and you will probably be given insulin after surgery (instead of oral diabetes medicines) to make sure you have good blood sugar levels. The goal for blood sugar control after surgery is 80-180 mg/dL.  Reviewed and Endorsed by [facility] Patient Education Committee, August 2015                              You may not have any metal on your body including hair pins, jewelry, and body piercing             Do not wear make-up, lotions, powders, perfumes, or deodorant  Do not wear nail polish including gel and S&S, artificial/acrylic nails, or any other type of covering on natural nails including finger and toenails. If you have artificial nails, gel coating, etc. that needs to be removed by a nail salon please have this removed prior to surgery or surgery may need to be canceled/ delayed if the surgeon/ anesthesia feels like they are unable to be safely monitored.   Do not shave  48 hours prior to surgery.    Do not bring valuables to the hospital. [place].   Contacts, glasses, dentures or bridgework may not be worn into surgery.   Bring small overnight bag day of surgery.   DO NOT [place]. PHARMACY WILL DISPENSE  MEDICATIONS LISTED ON YOUR MEDICATION LIST TO YOU DURING YOUR ADMISSION [place]!    Patients discharged on the day of surgery will not be allowed to drive home.  Someone NEEDS to stay with you for the first 24 hours after anesthesia.   Special Instructions: Bring a copy of your healthcare power of attorney and living will documents the day of surgery if you haven't scanned them  before.              Please read over the following fact sheets you were given: IF [place] [phone] Schneiders    If you received a COVID test during your pre-op visit  it is requested that you wear a mask when out in public, stay away from anyone that may not be feeling well and notify your surgeon if you develop symptoms. If you test positive for Covid or have been in contact with anyone that has tested positive in the last 10 days please notify you surgeon.    [place] - Preparing for Surgery Before surgery, you can play an important role.  Because skin is not sterile, your skin needs to be as free of germs as possible.  You can reduce the number of germs on your skin by washing with CHG (chlorahexidine gluconate) soap before surgery.  CHG is an antiseptic cleaner which kills germs and bonds with the skin to continue killing germs even after washing. Please DO NOT use if you have an allergy to CHG or antibacterial soaps.  If your skin becomes reddened/irritated stop using the CHG and inform your nurse when you arrive at Short Stay. Do not shave (including legs and underarms) for at least 48 hours prior to the first CHG shower.  You may shave your face/neck.  Please follow these instructions carefully:  1.  Shower with CHG Soap the night before surgery and the  morning of surgery.  2.  If you choose to wash your hair, wash your hair first as usual with your normal  shampoo.  3.  After you shampoo, rinse your hair and body thoroughly to remove the shampoo.                             4.  Use CHG as you would any other liquid soap.  You can apply chg directly to the skin and wash.  Gently with a scrungie or clean washcloth.  5.  Apply the CHG Soap to your body ONLY FROM THE NECK DOWN.   Do   not use on face/ open                           Wound or open sores. Avoid contact with eyes, ears mouth and   genitals (private parts).                        Wash face,  Genitals (private parts) with your normal soap.             6.  Wash thoroughly, paying special attention to the area where your    surgery  will be performed.  7.  Thoroughly rinse your body with warm water from the neck down.  8.  DO NOT shower/wash with your normal soap after using and rinsing off the CHG Soap.  9.  Pat yourself dry with a clean towel.            10.  Wear clean pajamas.            11.  Place clean sheets on your bed the night of your first shower and do not  sleep with pets. Day of Surgery : Do not apply any lotions/deodorants the morning of surgery.  Please wear clean clothes to the hospital/surgery center.  FAILURE TO FOLLOW THESE INSTRUCTIONS MAY RESULT IN THE CANCELLATION OF YOUR SURGERY  PATIENT SIGNATURE_________________________________  NURSE SIGNATURE__________________________________  ________________________________________________________________________  Adam Phenix  An incentive spirometer is a tool that can help keep your lungs clear and active. This tool measures how well you are filling your lungs with each breath. Taking long deep breaths may help reverse or decrease the chance of developing breathing (pulmonary) problems (especially infection) following: A long period of time when you are unable to move or be active. BEFORE THE PROCEDURE  If the spirometer includes an indicator to show your best effort, your nurse or respiratory therapist will set it to a desired goal. If possible, sit up straight or lean slightly forward. Try not to slouch. Hold the incentive spirometer in an upright position. INSTRUCTIONS FOR USE  Sit on the edge of your bed if possible, or sit up as far as you can in bed or on a chair. Hold the incentive spirometer in an upright position. Breathe out normally. Place the mouthpiece in your mouth and seal your lips tightly around it. Breathe in slowly and as deeply as possible, raising  the piston or the ball toward the top of the column. Hold your breath for 3-5 seconds or for as long as possible. Allow the piston or ball to fall to the bottom of the column. Remove the mouthpiece from your mouth and breathe out normally. Rest for a few seconds and repeat Steps 1 through 7 at least 10 times every 1-2 hours when you are awake. Take your time and take a few normal breaths between deep breaths. The spirometer may include an indicator to show your best effort. Use the indicator as a goal to work toward during each repetition. After each set of 10 deep breaths, practice coughing to be sure your lungs are clear. If you have an incision (the cut made at the time of surgery), support your incision when coughing by placing a pillow or rolled up towels firmly against it. Once you are able to get out of bed, walk around indoors and cough well. You may stop using the incentive spirometer when instructed by your caregiver.  RISKS AND COMPLICATIONS Take your time so you do not get dizzy or light-headed. If you are in pain, you may need to take or ask for pain medication before doing incentive spirometry. It is harder to take a deep breath if you are having pain. AFTER USE Rest and breathe slowly and easily. It can be helpful to keep track of a log of your progress. Your caregiver can provide you with a simple table to help with this. If you are using the spirometer at home, follow these instructions: [place] IF:  You are having difficultly using the spirometer. You have trouble using the spirometer as often as instructed. Your pain medication is not giving enough relief while using the spirometer. You develop fever of 100.5 F (38.1 C) or higher. SEEK IMMEDIATE MEDICAL CARE IF:  You cough up bloody sputum that had not  been present before. You develop fever of 102 F (38.9 C) or greater. You develop worsening pain at or near the incision site. MAKE SURE YOU:  Understand these  instructions. Will watch your condition. Will get help right away if you are not doing well or get worse. Document Released: 07/26/2006 Document Revised: 06/07/2011 Document Reviewed: 09/26/2006 [facility] Patient Information 2014 [place], [place].   ________________________________________________________________________

## 2022-05-19 ENCOUNTER — Encounter (HOSPITAL_COMMUNITY)
Admission: RE | Admit: 2022-05-19 | Discharge: 2022-05-19 | Disposition: A | Discharge status: Home / Self Care | Payer: Medicare Other | Source: Ambulatory Visit | Attending: Orthopedic Surgery | Admitting: Orthopedic Surgery

## 2022-05-19 ENCOUNTER — Encounter (HOSPITAL_COMMUNITY): Payer: Self-pay

## 2022-05-19 VITALS — BP 148/74 | HR 97 | Temp 98.4°F | Resp 16 | Ht 61.5 in | Wt 153.0 lb

## 2022-05-19 DIAGNOSIS — I10 Essential (primary) hypertension: Secondary | ICD-10-CM | POA: Diagnosis not present

## 2022-05-19 DIAGNOSIS — Z01812 Encounter for preprocedural laboratory examination: Secondary | ICD-10-CM | POA: Diagnosis not present

## 2022-05-19 DIAGNOSIS — E119 Type 2 diabetes mellitus without complications: Secondary | ICD-10-CM | POA: Insufficient documentation

## 2022-05-19 DIAGNOSIS — Z01818 Encounter for other preprocedural examination: Secondary | ICD-10-CM

## 2022-05-19 LAB — CBC
HCT: 42.2 % (ref 36.0–46.0)
Hemoglobin: 14 g/dL (ref 12.0–15.0)
MCH: 29.4 pg (ref 26.0–34.0)
MCHC: 33.2 g/dL (ref 30.0–36.0)
MCV: 88.7 fL (ref 80.0–100.0)
Platelets: 261 10*3/uL (ref 150–400)
RBC: 4.76 MIL/uL (ref 3.87–5.11)
RDW: 14.2 % (ref 11.5–15.5)
WBC: 7.1 10*3/uL (ref 4.0–10.5)
nRBC: 0 % (ref 0.0–0.2)

## 2022-05-19 LAB — BASIC METABOLIC PANEL
Anion gap: 8 (ref 5–15)
BUN: 34 mg/dL — ABNORMAL HIGH (ref 8–23)
CO2: 25 mmol/L (ref 22–32)
Calcium: 9.8 mg/dL (ref 8.9–10.3)
Chloride: 104 mmol/L (ref 98–111)
Creatinine, Ser: 0.8 mg/dL (ref 0.44–1.00)
GFR, Estimated: >60 mL/min (ref >60)
Glucose, Bld: 134 mg/dL — ABNORMAL HIGH (ref 70–99)
Potassium: 3.7 mmol/L (ref 3.5–5.1)
Sodium: 137 mmol/L (ref 135–145)

## 2022-05-19 LAB — SURGICAL PCR SCREEN
MRSA, PCR: NEGATIVE
Staphylococcus aureus: NEGATIVE

## 2022-05-19 LAB — GLUCOSE, CAPILLARY: Glucose-Capillary: 146 mg/dL — ABNORMAL HIGH (ref 70–99)

## 2022-05-21 DIAGNOSIS — M1712 Unilateral primary osteoarthritis, left knee: Secondary | ICD-10-CM | POA: Diagnosis not present

## 2022-05-21 DIAGNOSIS — M25562 Pain in left knee: Secondary | ICD-10-CM | POA: Diagnosis not present

## 2022-05-24 NOTE — Progress Notes (Signed)
Anesthesia Chart Review   Case: [id number] Date/Time: 06/01/22 0700   Procedure: TOTAL KNEE ARTHROPLASTY (Left: Knee)   Anesthesia type: Spinal   Pre-op diagnosis: Left knee osteoarthritis   Location: [facility] ROOM 09 / [facility]   Surgeons: Paralee Cancel, MD       DISCUSSION:82 y.o. never smoker with h/o HTN, DM II, breast cancer 2004, left knee OA scheduled for above procedure 06/01/2022 with Dr. Paralee Cancel.   Pt seen by PCP 04/19/22 for preop evaluation.  Per OV note, "Low risk for low risk procedure No further evaluation needed prior to surgery" No murmur noted by PCP.  I also listened to the patient during PAT, no murmur noted.    Anticipate pt can proceed with planned procedure barring acute status change.   VS: BP (!) 148/74   Pulse 97   Temp 36.9 C (Oral)   Resp 16   Ht 5' 1.5" (1.562 m)   Wt 69.4 kg   SpO2 94%   BMI 28.44 kg/m   PROVIDERS: Binnie Rail, MD is PCP    LABS: Labs reviewed: Acceptable for surgery. (all labs ordered are listed, but only abnormal results are displayed)  Labs Reviewed  BASIC METABOLIC PANEL - Abnormal; Notable for the following components:      Result Value   Glucose, Bld 134 (*)    BUN 34 (*)    All other components within normal limits  GLUCOSE, CAPILLARY - Abnormal; Notable for the following components:   Glucose-Capillary 146 (*)    All other components within normal limits  SURGICAL PCR SCREEN  CBC     IMAGES:   EKG:   CV:  Past Medical History:  Diagnosis Date   Breast cancer ([place]) left-2004 sx   Diabetes mellitus    TYPE 2   Diverticulitis    09/14/12   Diverticulosis    Heart murmur    History of radiation therapy    History of urinary tract infection    Hyperlipidemia    Hypertension    Osteoporosis    Fosamax Rxed 2/13   Personal history of colonic adenomas 11/15/2012   Personal history of radiation therapy 06/2002   Pneumonia    2013   Seasonal allergies    Tingling    right leg     Past Surgical  History:  Procedure Laterality Date   ABDOMINAL HYSTERECTOMY  1965   ovaries remain; for dysfunctional menses; ? endometriosis   ABDOMINAL SURGERY     BREAST BIOPSY Left 04/16/2002   malignant   BREAST LUMPECTOMY Left    colon polyps  2014   Dr Carlean Purl   COLONOSCOPY  2004    Tics   DILATION AND CURETTAGE OF UTERUS     LAPAROSCOPIC SIGMOID COLECTOMY  04/13/2013   DR Felida N/A 04/13/2013   Procedure: LAPAROSCOPIC ASSISTED SIGMOID COLECTOMY;  Surgeon: Merrie Roof, MD;  Location: [place];  Service: General;  Laterality: N/A;   LUMBAR LAMINECTOMY/DECOMPRESSION MICRODISCECTOMY N/A 02/19/2015   Procedure: MICRODISCECTOMY AT L4-L5, CENTRAL DECOMPRESSION L4-L5 FOR SPINAL STENOSIS, FORAMINOTOMY FOR L4 L5 ROOT ON RIGHT;  Surgeon: Latanya Maudlin, MD;  Location: [facility];  Service: Orthopedics;  Laterality: N/A;   removal bone spur Left 2/14   2nd finger   TONSILLECTOMY AND ADENOIDECTOMY      MEDICATIONS:  Biotin w/ Vitamins C & E (HAIR SKIN & NAILS GUMMIES PO)   Calcium-Vitamin D-Vitamin K (VIACTIV CALCIUM PLUS D) 650-12.5-40 MG-MCG-MCG CHEW  cycloSPORINE (RESTASIS) 0.05 % ophthalmic emulsion   diclofenac Sodium (VOLTAREN) 1 % GEL   fenofibrate (TRICOR) 145 MG tablet   icosapent Ethyl (VASCEPA) 1 g capsule   Lancets (ONETOUCH ULTRASOFT) lancets   losartan (COZAAR) 50 MG tablet   Melatonin 10 MG TABS   meloxicam (MOBIC) 7.5 MG tablet   metroNIDAZOLE (METROGEL) 0.75 % gel   Multiple Vitamins-Minerals (MULTI + OMEGA-3 ADULT GUMMIES PO)   Multiple Vitamins-Minerals (OCUVITE PRESERVISION PO)   ONE TOUCH ULTRA TEST test strip   Polyethyl Glycol-Propyl Glycol 0.4-0.3 % SOLN   pravastatin (PRAVACHOL) 40 MG tablet   triamterene-hydrochlorothiazide (MAXZIDE) 75-50 MG tablet   vitamin C (ASCORBIC ACID) 250 MG tablet   No current facility-administered medications for this encounter.    Konrad Felix Ward, PA-C [facility] Pre-Surgical Testing [phone]

## 2022-06-01 ENCOUNTER — Ambulatory Visit (HOSPITAL_COMMUNITY): Payer: Medicare Other | Admitting: Physician Assistant

## 2022-06-01 ENCOUNTER — Encounter (HOSPITAL_COMMUNITY): Payer: Self-pay | Admitting: Orthopedic Surgery

## 2022-06-01 ENCOUNTER — Observation Stay (HOSPITAL_COMMUNITY)
Admission: RE | Admit: 2022-06-01 | Discharge: 2022-06-02 | Disposition: A | Discharge status: Home / Self Care | Payer: Medicare Other | Source: Ambulatory Visit | Attending: Orthopedic Surgery | Admitting: Orthopedic Surgery

## 2022-06-01 ENCOUNTER — Other Ambulatory Visit: Payer: Self-pay

## 2022-06-01 ENCOUNTER — Encounter (HOSPITAL_COMMUNITY)
Admission: RE | Disposition: A | Discharge status: Home / Self Care | Payer: Self-pay | Source: Ambulatory Visit | Attending: Orthopedic Surgery

## 2022-06-01 ENCOUNTER — Ambulatory Visit (HOSPITAL_BASED_OUTPATIENT_CLINIC_OR_DEPARTMENT_OTHER): Payer: Medicare Other | Admitting: Registered Nurse

## 2022-06-01 DIAGNOSIS — M25762 Osteophyte, left knee: Secondary | ICD-10-CM | POA: Diagnosis not present

## 2022-06-01 DIAGNOSIS — M1712 Unilateral primary osteoarthritis, left knee: Secondary | ICD-10-CM | POA: Diagnosis not present

## 2022-06-01 DIAGNOSIS — M25462 Effusion, left knee: Secondary | ICD-10-CM | POA: Diagnosis not present

## 2022-06-01 DIAGNOSIS — I10 Essential (primary) hypertension: Secondary | ICD-10-CM

## 2022-06-01 DIAGNOSIS — E119 Type 2 diabetes mellitus without complications: Secondary | ICD-10-CM | POA: Diagnosis not present

## 2022-06-01 DIAGNOSIS — Z96652 Presence of left artificial knee joint: Secondary | ICD-10-CM

## 2022-06-01 DIAGNOSIS — M659 Synovitis and tenosynovitis, unspecified: Secondary | ICD-10-CM | POA: Diagnosis not present

## 2022-06-01 DIAGNOSIS — Z853 Personal history of malignant neoplasm of breast: Secondary | ICD-10-CM | POA: Diagnosis not present

## 2022-06-01 DIAGNOSIS — G8918 Other acute postprocedural pain: Secondary | ICD-10-CM | POA: Diagnosis not present

## 2022-06-01 HISTORY — PX: TOTAL KNEE ARTHROPLASTY: SHX125

## 2022-06-01 LAB — GLUCOSE, CAPILLARY
Glucose-Capillary: 112 mg/dL — ABNORMAL HIGH (ref 70–99)
Glucose-Capillary: 138 mg/dL — ABNORMAL HIGH (ref 70–99)
Glucose-Capillary: 128 mg/dL — ABNORMAL HIGH (ref 70–99)
Glucose-Capillary: 188 mg/dL — ABNORMAL HIGH (ref 70–99)
Glucose-Capillary: 184 mg/dL — ABNORMAL HIGH (ref 70–99)

## 2022-06-01 SURGERY — ARTHROPLASTY, KNEE, TOTAL
Anesthesia: Monitor Anesthesia Care | Site: Knee | Laterality: Left

## 2022-06-01 MED ORDER — TRANEXAMIC ACID-NACL 1000-0.7 MG/100ML-% IV SOLN
1000.0000 mg | Freq: Once | INTRAVENOUS | Status: AC
  Administered 2022-06-01: 1000 mg via INTRAVENOUS
  Filled 2022-06-01: qty 100

## 2022-06-01 MED ORDER — AMISULPRIDE (ANTIEMETIC) 5 MG/2ML IV SOLN: 10.0000 mg | Freq: Once | INTRAVENOUS | Status: DC | PRN

## 2022-06-01 MED ORDER — KETOROLAC TROMETHAMINE 30 MG/ML IJ SOLN
INTRAMUSCULAR | Status: AC
  Filled 2022-06-01: qty 1

## 2022-06-01 MED ORDER — MENTHOL 3 MG MT LOZG: 1.0000 | LOZENGE | OROMUCOSAL | Status: DC | PRN

## 2022-06-01 MED ORDER — ICOSAPENT ETHYL 1 G PO CAPS
2.0000 g | ORAL_CAPSULE | Freq: Two times a day (BID) | ORAL | Status: DC
  Filled 2022-06-01 (×2): qty 2

## 2022-06-01 MED ORDER — PHENOL 1.4 % MT LIQD: 1.0000 | OROMUCOSAL | Status: DC | PRN

## 2022-06-01 MED ORDER — FENOFIBRATE 160 MG PO TABS
160.0000 mg | ORAL_TABLET | Freq: Every day | ORAL | Status: DC
  Administered 2022-06-02: 160 mg via ORAL
  Filled 2022-06-01: qty 1

## 2022-06-01 MED ORDER — CEFAZOLIN SODIUM-DEXTROSE 2-4 GM/100ML-% IV SOLN
2.0000 g | INTRAVENOUS | Status: AC
  Administered 2022-06-01: 2 g via INTRAVENOUS
  Filled 2022-06-01: qty 100

## 2022-06-01 MED ORDER — TRIAMTERENE-HCTZ 75-50 MG PO TABS
0.5000 | ORAL_TABLET | Freq: Every day | ORAL | Status: DC
  Administered 2022-06-02: 0.5 via ORAL
  Filled 2022-06-01: qty 0.5

## 2022-06-01 MED ORDER — DEXAMETHASONE SODIUM PHOSPHATE 10 MG/ML IJ SOLN
INTRAMUSCULAR | Status: AC
  Filled 2022-06-01: qty 1

## 2022-06-01 MED ORDER — DIPHENHYDRAMINE HCL 12.5 MG/5ML PO ELIX: 12.5000 mg | ORAL_SOLUTION | ORAL | Status: DC | PRN

## 2022-06-01 MED ORDER — 0.9 % SODIUM CHLORIDE (POUR BTL) OPTIME
TOPICAL | Status: DC | PRN
  Administered 2022-06-01: 1000 mL

## 2022-06-01 MED ORDER — CLONIDINE HCL (ANALGESIA) 100 MCG/ML EP SOLN
EPIDURAL | Status: DC | PRN
  Administered 2022-06-01: 50 ug

## 2022-06-01 MED ORDER — BISACODYL 10 MG RE SUPP: 10.0000 mg | Freq: Every day | RECTAL | Status: DC | PRN

## 2022-06-01 MED ORDER — SODIUM CHLORIDE (PF) 0.9 % IJ SOLN
INTRAMUSCULAR | Status: AC
  Filled 2022-06-01: qty 30

## 2022-06-01 MED ORDER — PROPOFOL 1000 MG/100ML IV EMUL
INTRAVENOUS | Status: AC
  Filled 2022-06-01: qty 100

## 2022-06-01 MED ORDER — LACTATED RINGERS IV SOLN: INTRAVENOUS | Status: DC

## 2022-06-01 MED ORDER — POLYETHYLENE GLYCOL 3350 17 G PO PACK
17.0000 g | PACK | Freq: Two times a day (BID) | ORAL | Status: DC
  Filled 2022-06-01 (×2): qty 1

## 2022-06-01 MED ORDER — ONDANSETRON HCL 4 MG/2ML IJ SOLN: 4.0000 mg | Freq: Four times a day (QID) | INTRAMUSCULAR | Status: DC | PRN

## 2022-06-01 MED ORDER — PRAVASTATIN SODIUM 20 MG PO TABS
40.0000 mg | ORAL_TABLET | Freq: Every day | ORAL | Status: DC
  Administered 2022-06-01 – 2022-06-02 (×2): 40 mg via ORAL
  Filled 2022-06-01 (×2): qty 2

## 2022-06-01 MED ORDER — METOCLOPRAMIDE HCL 5 MG PO TABS: 5.0000 mg | ORAL_TABLET | Freq: Three times a day (TID) | ORAL | Status: DC | PRN

## 2022-06-01 MED ORDER — BUPIVACAINE-EPINEPHRINE (PF) 0.25% -1:200000 IJ SOLN
INTRAMUSCULAR | Status: AC
  Filled 2022-06-01: qty 30

## 2022-06-01 MED ORDER — MELOXICAM 15 MG PO TABS
7.5000 mg | ORAL_TABLET | Freq: Every day | ORAL | Status: DC
  Filled 2022-06-01: qty 1

## 2022-06-01 MED ORDER — METOCLOPRAMIDE HCL 5 MG/ML IJ SOLN: 5.0000 mg | Freq: Three times a day (TID) | INTRAMUSCULAR | Status: DC | PRN

## 2022-06-01 MED ORDER — OXYCODONE HCL 5 MG PO TABS
5.0000 mg | ORAL_TABLET | ORAL | Status: DC | PRN
  Administered 2022-06-01 – 2022-06-02 (×2): 5 mg via ORAL
  Filled 2022-06-01 (×2): qty 1

## 2022-06-01 MED ORDER — INSULIN ASPART 100 UNIT/ML IJ SOLN
0.0000 [IU] | Freq: Three times a day (TID) | INTRAMUSCULAR | Status: DC
  Administered 2022-06-01: 3 [IU] via SUBCUTANEOUS

## 2022-06-01 MED ORDER — SODIUM CHLORIDE (PF) 0.9 % IJ SOLN
INTRAMUSCULAR | Status: DC | PRN
  Administered 2022-06-01: 30 mL

## 2022-06-01 MED ORDER — ONDANSETRON HCL 4 MG/2ML IJ SOLN
INTRAMUSCULAR | Status: DC | PRN
  Administered 2022-06-01: 4 mg via INTRAVENOUS

## 2022-06-01 MED ORDER — METHOCARBAMOL 500 MG PO TABS: 500.0000 mg | ORAL_TABLET | Freq: Four times a day (QID) | ORAL | Status: DC | PRN

## 2022-06-01 MED ORDER — ACETAMINOPHEN 500 MG PO TABS
1000.0000 mg | ORAL_TABLET | Freq: Four times a day (QID) | ORAL | Status: AC
  Administered 2022-06-01 – 2022-06-02 (×4): 1000 mg via ORAL
  Filled 2022-06-01 (×4): qty 2

## 2022-06-01 MED ORDER — METHOCARBAMOL 500 MG IVPB - SIMPLE MED
500.0000 mg | Freq: Four times a day (QID) | INTRAVENOUS | Status: DC | PRN
  Filled 2022-06-01: qty 55

## 2022-06-01 MED ORDER — CHLORHEXIDINE GLUCONATE 0.12 % MT SOLN
15.0000 mL | Freq: Once | OROMUCOSAL | Status: AC
  Administered 2022-06-01: 15 mL via OROMUCOSAL

## 2022-06-01 MED ORDER — ROPIVACAINE HCL 5 MG/ML IJ SOLN
INTRAMUSCULAR | Status: DC | PRN
  Administered 2022-06-01: 20 mL via PERINEURAL

## 2022-06-01 MED ORDER — SODIUM CHLORIDE 0.9 % IV SOLN: INTRAVENOUS | Status: DC

## 2022-06-01 MED ORDER — BUPIVACAINE-EPINEPHRINE (PF) 0.25% -1:200000 IJ SOLN
INTRAMUSCULAR | Status: DC | PRN
  Administered 2022-06-01: 30 mL

## 2022-06-01 MED ORDER — ACETAMINOPHEN 325 MG PO TABS: 325.0000 mg | ORAL_TABLET | Freq: Four times a day (QID) | ORAL | Status: DC | PRN

## 2022-06-01 MED ORDER — DEXAMETHASONE SODIUM PHOSPHATE 10 MG/ML IJ SOLN
8.0000 mg | Freq: Once | INTRAMUSCULAR | Status: AC
  Administered 2022-06-01: 10 mg via INTRAVENOUS

## 2022-06-01 MED ORDER — OXYCODONE HCL 5 MG PO TABS
10.0000 mg | ORAL_TABLET | ORAL | Status: DC | PRN
  Administered 2022-06-01: 10 mg via ORAL
  Filled 2022-06-01: qty 2

## 2022-06-01 MED ORDER — PHENYLEPHRINE 80 MCG/ML (10ML) SYRINGE FOR IV PUSH (FOR BLOOD PRESSURE SUPPORT)
PREFILLED_SYRINGE | INTRAVENOUS | Status: DC | PRN
  Administered 2022-06-01: 80 ug via INTRAVENOUS

## 2022-06-01 MED ORDER — ORAL CARE MOUTH RINSE: 15.0000 mL | Freq: Once | OROMUCOSAL | Status: AC

## 2022-06-01 MED ORDER — TRANEXAMIC ACID-NACL 1000-0.7 MG/100ML-% IV SOLN
1000.0000 mg | INTRAVENOUS | Status: AC
  Administered 2022-06-01: 1000 mg via INTRAVENOUS
  Filled 2022-06-01: qty 100

## 2022-06-01 MED ORDER — DEXAMETHASONE SODIUM PHOSPHATE 10 MG/ML IJ SOLN
10.0000 mg | Freq: Once | INTRAMUSCULAR | Status: AC
  Administered 2022-06-02: 10 mg via INTRAVENOUS
  Filled 2022-06-01: qty 1

## 2022-06-01 MED ORDER — DOCUSATE SODIUM 100 MG PO CAPS
100.0000 mg | ORAL_CAPSULE | Freq: Two times a day (BID) | ORAL | Status: DC
  Administered 2022-06-01 – 2022-06-02 (×2): 100 mg via ORAL
  Filled 2022-06-01 (×2): qty 1

## 2022-06-01 MED ORDER — HYDROMORPHONE HCL 1 MG/ML IJ SOLN: 0.5000 mg | INTRAMUSCULAR | Status: DC | PRN

## 2022-06-01 MED ORDER — ACETAMINOPHEN 500 MG PO TABS
1000.0000 mg | ORAL_TABLET | Freq: Once | ORAL | Status: AC
  Administered 2022-06-01: 1000 mg via ORAL
  Filled 2022-06-01: qty 2

## 2022-06-01 MED ORDER — CEFAZOLIN SODIUM-DEXTROSE 2-4 GM/100ML-% IV SOLN
2.0000 g | Freq: Four times a day (QID) | INTRAVENOUS | Status: AC
  Administered 2022-06-01 (×2): 2 g via INTRAVENOUS
  Filled 2022-06-01 (×2): qty 100

## 2022-06-01 MED ORDER — ONDANSETRON HCL 4 MG PO TABS: 4.0000 mg | ORAL_TABLET | Freq: Four times a day (QID) | ORAL | Status: DC | PRN

## 2022-06-01 MED ORDER — PROPOFOL 500 MG/50ML IV EMUL
INTRAVENOUS | Status: DC | PRN
  Administered 2022-06-01: 30 ug/kg/min via INTRAVENOUS

## 2022-06-01 MED ORDER — KETOROLAC TROMETHAMINE 30 MG/ML IJ SOLN
INTRAMUSCULAR | Status: DC | PRN
  Administered 2022-06-01: 30 mg

## 2022-06-01 MED ORDER — BUPIVACAINE IN DEXTROSE 0.75-8.25 % IT SOLN
INTRATHECAL | Status: DC | PRN
  Administered 2022-06-01: 1.6 mL via INTRATHECAL

## 2022-06-01 MED ORDER — SODIUM CHLORIDE 0.9 % IR SOLN
Status: DC | PRN
  Administered 2022-06-01: 1000 mL

## 2022-06-01 MED ORDER — PHENYLEPHRINE 80 MCG/ML (10ML) SYRINGE FOR IV PUSH (FOR BLOOD PRESSURE SUPPORT)
PREFILLED_SYRINGE | INTRAVENOUS | Status: AC
  Filled 2022-06-01: qty 10

## 2022-06-01 MED ORDER — FENTANYL CITRATE (PF) 100 MCG/2ML IJ SOLN
INTRAMUSCULAR | Status: AC
  Filled 2022-06-01: qty 2

## 2022-06-01 MED ORDER — POVIDONE-IODINE 10 % EX SWAB
2.0000 | Freq: Once | CUTANEOUS | Status: AC
  Administered 2022-06-01: 2 via TOPICAL

## 2022-06-01 MED ORDER — PHENYLEPHRINE HCL-NACL 20-0.9 MG/250ML-% IV SOLN
INTRAVENOUS | Status: DC | PRN
  Administered 2022-06-01: 25 ug/min via INTRAVENOUS

## 2022-06-01 MED ORDER — ASPIRIN 81 MG PO CHEW
81.0000 mg | CHEWABLE_TABLET | Freq: Two times a day (BID) | ORAL | Status: DC
  Administered 2022-06-01 – 2022-06-02 (×2): 81 mg via ORAL
  Filled 2022-06-01 (×2): qty 1

## 2022-06-01 MED ORDER — STERILE WATER FOR IRRIGATION IR SOLN
Status: DC | PRN
  Administered 2022-06-01: 2000 mL

## 2022-06-01 MED ORDER — FENTANYL CITRATE PF 50 MCG/ML IJ SOSY: 25.0000 ug | PREFILLED_SYRINGE | INTRAMUSCULAR | Status: DC | PRN

## 2022-06-01 MED ORDER — LOSARTAN POTASSIUM 50 MG PO TABS: 50.0000 mg | ORAL_TABLET | Freq: Every evening | ORAL | Status: DC

## 2022-06-01 MED ORDER — MELATONIN 5 MG PO TABS
20.0000 mg | ORAL_TABLET | Freq: Every day | ORAL | Status: DC
  Administered 2022-06-01: 20 mg via ORAL
  Filled 2022-06-01: qty 4

## 2022-06-01 MED ORDER — FENTANYL CITRATE (PF) 100 MCG/2ML IJ SOLN
INTRAMUSCULAR | Status: DC | PRN
  Administered 2022-06-01: 50 ug via INTRAVENOUS

## 2022-06-01 MED ORDER — ONDANSETRON HCL 4 MG/2ML IJ SOLN
INTRAMUSCULAR | Status: AC
  Filled 2022-06-01: qty 2

## 2022-06-01 SURGICAL SUPPLY — 56 items
ADH SKN CLS APL DERMABOND .7 (GAUZE/BANDAGES/DRESSINGS) ×1
ATTUNE MED ANAT PAT 35 KNEE (Knees) IMPLANT
BAG COUNTER SPONGE SURGICOUNT (BAG) IMPLANT
BAG SPEC THK2 15X12 ZIP CLS (MISCELLANEOUS)
BAG SPNG CNTER NS LX DISP (BAG)
BAG ZIPLOCK 12X15 (MISCELLANEOUS) IMPLANT
BASEPLATE TIB CMT FB PCKT SZ3 (Knees) IMPLANT
BLADE SAW SGTL 11.0X1.19X90.0M (BLADE) IMPLANT
BLADE SAW SGTL 13.0X1.19X90.0M (BLADE) ×1 IMPLANT
BNDG CMPR 5X62 HK CLSR LF (GAUZE/BANDAGES/DRESSINGS) ×1
BNDG ELASTIC 6INX 5YD STR LF (GAUZE/BANDAGES/DRESSINGS) ×1 IMPLANT
BNDG ELASTIC 6X5.8 VLCR STR LF (GAUZE/BANDAGES/DRESSINGS) IMPLANT
BOWL SMART MIX CTS (DISPOSABLE) ×1 IMPLANT
BSPLAT TIB 3 CMNT FXBRNG STRL (Knees) ×1 IMPLANT
CEMENT HV SMART SET (Cement) IMPLANT
COMP FEM CMT ATTUNE NRS 4 LT (Joint) ×1 IMPLANT
COMPONENT FEM CMT ATTN NRS 4LT (Joint) IMPLANT
COVER SURGICAL LIGHT HANDLE (MISCELLANEOUS) ×1 IMPLANT
CUFF TOURN SGL QUICK 34 (TOURNIQUET CUFF) ×1
CUFF TRNQT CYL 34X4.125X (TOURNIQUET CUFF) ×1 IMPLANT
DERMABOND ADVANCED .7 DNX12 (GAUZE/BANDAGES/DRESSINGS) ×1 IMPLANT
DRAPE U-SHAPE 47X51 STRL (DRAPES) ×1 IMPLANT
DRESSING AQUACEL AG SP 3.5X10 (GAUZE/BANDAGES/DRESSINGS) ×1 IMPLANT
DRSG AQUACEL AG SP 3.5X10 (GAUZE/BANDAGES/DRESSINGS) ×1
DURAPREP 26ML APPLICATOR (WOUND CARE) ×2 IMPLANT
ELECT REM PT RETURN 15FT ADLT (MISCELLANEOUS) ×1 IMPLANT
GLOVE BIO SURGEON STRL SZ 6 (GLOVE) ×1 IMPLANT
GLOVE BIOGEL PI IND STRL 6.5 (GLOVE) ×1 IMPLANT
GLOVE BIOGEL PI IND STRL 7.5 (GLOVE) ×1 IMPLANT
GLOVE ORTHO TXT STRL SZ7.5 (GLOVE) ×2 IMPLANT
GOWN STRL REUS W/ TWL LRG LVL3 (GOWN DISPOSABLE) ×2 IMPLANT
GOWN STRL REUS W/TWL LRG LVL3 (GOWN DISPOSABLE) ×2
HANDPIECE INTERPULSE COAX TIP (DISPOSABLE) ×1
HOLDER FOLEY CATH W/STRAP (MISCELLANEOUS) IMPLANT
INSERT MED ATTUNE KNEE 4 7 LT (Insert) IMPLANT
KIT TURNOVER KIT A (KITS) IMPLANT
MANIFOLD NEPTUNE II (INSTRUMENTS) ×1 IMPLANT
NDL SAFETY ECLIP 18X1.5 (MISCELLANEOUS) IMPLANT
NS IRRIG 1000ML POUR BTL (IV SOLUTION) ×1 IMPLANT
PACK TOTAL KNEE CUSTOM (KITS) ×1 IMPLANT
PIN FIX SIGMA LCS THRD HI (PIN) IMPLANT
PROTECTOR NERVE ULNAR (MISCELLANEOUS) ×1 IMPLANT
SET HNDPC FAN SPRY TIP SCT (DISPOSABLE) ×1 IMPLANT
SET PAD KNEE POSITIONER (MISCELLANEOUS) ×1 IMPLANT
SPIKE FLUID TRANSFER (MISCELLANEOUS) ×2 IMPLANT
SUT MNCRL AB 4-0 PS2 18 (SUTURE) ×1 IMPLANT
SUT STRATAFIX PDS+ 0 24IN (SUTURE) ×1 IMPLANT
SUT VIC AB 1 CT1 36 (SUTURE) ×1 IMPLANT
SUT VIC AB 2-0 CT1 27 (SUTURE) ×2
SUT VIC AB 2-0 CT1 TAPERPNT 27 (SUTURE) ×2 IMPLANT
SYR 3ML LL SCALE MARK (SYRINGE) ×1 IMPLANT
TOWEL GREEN STERILE FF (TOWEL DISPOSABLE) ×1 IMPLANT
TRAY FOLEY MTR SLVR 16FR STAT (SET/KITS/TRAYS/PACK) ×1 IMPLANT
TUBE SUCTION HIGH CAP CLEAR NV (SUCTIONS) ×1 IMPLANT
WATER STERILE IRR 1000ML POUR (IV SOLUTION) ×2 IMPLANT
WRAP KNEE MAXI GEL POST OP (GAUZE/BANDAGES/DRESSINGS) ×1 IMPLANT

## 2022-06-01 NOTE — Op Note (Signed)
NAME:  Tiffany Hood                      MEDICAL RECORD NO.:  [id number]                             FACILITY:  [facility]      PHYSICIAN:  Pietro Cassis. Alvan Dame, M.D.  DATE OF BIRTH:  Apr 16, 1940      DATE OF PROCEDURE:  06/01/2022                                     OPERATIVE REPORT         PREOPERATIVE DIAGNOSIS:  Left knee osteoarthritis.      POSTOPERATIVE DIAGNOSIS:  Left knee osteoarthritis.      FINDINGS:  The patient was noted to have complete loss of cartilage and   bone-on-bone arthritis with associated osteophytes in the medial and patellofemoral compartments of   the knee with a significant synovitis and associated effusion.  The patient had failed months of conservative treatment including medications, injection therapy, activity modification.     PROCEDURE:  Left total knee replacement.      COMPONENTS USED:  DePuy Attune MS CR knee   system, a size 4N femur, 3 tibia, size 7 mm MS CR AOX insert, and 35 anatomic patellar   button.      SURGEON:  Pietro Cassis. Alvan Dame, M.D.      ASSISTANT:  Costella Hatcher, PA-C.      ANESTHESIA:  Regional and Spinal.      SPECIMENS:  None.      COMPLICATION:  None.      DRAINS:  None.  EBL: <100 cc      TOURNIQUET TIME:  26 min @ 225 mmHg     The patient was stable to the recovery room.      INDICATION FOR PROCEDURE:  Tiffany Hood is a 82 y.o. female patient of   mine.  The patient had been seen, evaluated, and treated for months conservatively in the   office with medication, activity modification, and injections.  The patient had   radiographic changes of bone-on-bone arthritis with endplate sclerosis and osteophytes noted.  Based on the radiographic changes and failed conservative measures, the patient   decided to proceed with definitive treatment, total knee replacement.  Risks of infection, DVT, component failure, need for revision surgery, neurovascular injury were reviewed in the office setting.  The postop course was reviewed  stressing the efforts to maximize post-operative satisfaction and function.  Consent was obtained for benefit of pain   relief.      PROCEDURE IN DETAIL:  The patient was brought to the operative theater.   Once adequate anesthesia, preoperative antibiotics, 2 gm of Ancef,1 gm of Tranexamic Acid, and 10 mg of Decadron administered, the patient was positioned supine with a left thigh tourniquet placed.  The  left lower extremity was prepped and draped in sterile fashion.  A time-   out was performed identifying the patient, planned procedure, and the appropriate extremity.      The left lower extremity was placed in the [facility] leg holder.  The leg was   exsanguinated, tourniquet elevated to 225 mmHg.  A midline incision was   made followed by median parapatellar arthrotomy.  Following initial   exposure, attention was first  directed to the patella.  Precut   measurement was noted to be 20 mm.  I resected down to 13 mm and used a   35 anatomic patellar button to restore patellar height as well as cover the cut surface.      The lug holes were drilled and a metal shim was placed to protect the   patella from retractors and saw blade during the procedure.      At this point, attention was now directed to the femur.  The femoral   canal was opened with a drill, irrigated to try to prevent fat emboli.  An   intramedullary rod was passed at 3 degrees valgus, 9 mm of bone was   resected off the distal femur.  Following this resection, the tibia was   subluxated anteriorly.  Using the extramedullary guide, 2 mm of bone was resected off   the proximal medial tibia.  We confirmed the gap would be   stable medially and laterally with a size 5 spacer block as well as confirmed that the tibial cut was perpendicular in the coronal plane, checking with an alignment rod.      Once this was done, I sized the femur to be a size 4 in the anterior-   posterior dimension, chose a narrow component based on  medial and   lateral dimension.  The size 4 rotation block was then pinned in   position anterior referenced using the C-clamp to set rotation.  The   anterior, posterior, and  chamfer cuts were made without difficulty nor   notching making certain that I was along the anterior cortex to help   with flexion gap stability.      The anterior shim cut was made off the lateral aspect of distal femur.      At this point, the tibia was sized to be a size 3.  The size 3 tray was   then pinned in position after marking rotation by floating a trial tibia through flexion and extension, drilled, and keel punched.  Trial reduction was now carried with a 4 femur,  3 tibia, a size 7 mm CR insert, and the 35 anatomic patella botton.  The knee was brought to full extension with good flexion stability with the patella   tracking through the trochlea without application of pressure.  Given   all these findings the trial components removed.  Final components were   opened and cement was mixed.  The knee was irrigated with normal saline solution and pulse lavage.  The synovial lining was   then injected with 30 cc of 0.25% Marcaine with epinephrine, 1 cc of Toradol and 30 cc of NS for a total of 61 cc.     Final implants were then cemented onto cleaned and dried cut surfaces of bone with the knee brought to extension with a size 7 mm MS CR trial insert.      Once the cement had fully cured, excess cement was removed   throughout the knee.  I confirmed that I was satisfied with the range of   motion and stability, and the final size 7 mm MS CR AOX insert was chosen.  It was   placed into the knee.      The tourniquet had been let down at 26 minutes.  No significant   hemostasis was required.  The extensor mechanism was then reapproximated using #1 Vicryl and #1 Stratafix sutures with the knee   in  flexion.  The   remaining wound was closed with 2-0 Vicryl and running 4-0 Monocryl.   The knee was cleaned,  dried, dressed sterilely using Dermabond and   Aquacel dressing.  The patient was then   brought to recovery room in stable condition, tolerating the procedure   well.   Please note that Physician Assistant, Costella Hatcher, PA-C was present for the entirety of the case, and was utilized for pre-operative positioning, peri-operative retractor management, general facilitation of the procedure and for primary wound closure at the end of the case.              Pietro Cassis Alvan Dame, M.D.    06/01/2022 7:15 AM

## 2022-06-01 NOTE — Anesthesia Procedure Notes (Signed)
Anesthesia Regional Block: Adductor canal block   Pre-Anesthetic Checklist: , timeout performed,  Correct Patient, Correct Site, Correct Laterality,  Correct Procedure, Correct Position, site marked,  Risks and benefits discussed,  Surgical consent,  Pre-op evaluation,  At surgeon's request and post-op pain management  Laterality: Left  Prep: chloraprep       Needles:  Injection technique: Single-shot  Needle Type: Echogenic Needle     Needle Length: 9cm  Needle Gauge: 21     Additional Needles:   Procedures:,,,, ultrasound used (permanent image in chart),,    Narrative:  Start time: 06/01/2022 6:50 AM End time: 06/01/2022 6:54 AM Injection made incrementally with aspirations every 5 mL.  Performed by: Personally  Anesthesiologist: Suzette Battiest, MD

## 2022-06-01 NOTE — Care Plan (Signed)
Ortho Bundle Case Management Note  Patient Details  Name: Tiffany Hood MRN: [id number] Date of Birth: 06-05-1940  L TKA on 06-01-22 DCP:  Home with sister and son DME:  No needs, has a RW PT:  [facility] on 06-07-22                   DME Arranged:  N/A DME Agency:  NA  HH Arranged:  NA HH Agency:  NA  Additional Comments: Please contact me with any questions of if this plan should need to change.  Marianne Sofia, RN,CCM [facility]  [phone] 06/01/2022, 5:03 PM

## 2022-06-01 NOTE — Evaluation (Signed)
Physical Therapy Evaluation Patient Details Name: Tiffany Hood MRN: [id number] DOB: 1941/01/01 Today's Date: 06/01/2022  History of Present Illness  82 yo female presents to therapy s/p L TKA on 06/01/2022 due to failure of conservative measures. Pt PMH includes but is not limited to: glaucoma, thyroid nodules, DM II, spinal stenosis, diverticulitis, HDL, HTN, and L BaCa.  Clinical Impression    Tiffany Hood is a 82 y.o. female POD 0 s/p L TKA. Patient reports IND with mobility at baseline. Patient is now limited by functional impairments (see PT problem list below) and requires S for bed mobility and min guard for transfers. Patient was able to ambulate 56 feet with RW and min guard level of assist. Patient instructed in exercise to facilitate ROM and circulation to manage edema. Patient will benefit from continued skilled PT interventions to address impairments and progress towards PLOF. Acute PT will follow to progress mobility and stair training in preparation for safe discharge home with anticipation of OPPT starting on 3/11.      Recommendations for follow up therapy are one component of a multi-disciplinary discharge planning process, led by the attending physician.  Recommendations may be updated based on patient status, additional functional criteria and insurance authorization.  Follow Up Recommendations Outpatient PT      Assistance Recommended at Discharge Intermittent Supervision/Assistance  Patient can return home with the following  A little help with walking and/or transfers;A little help with bathing/dressing/bathroom;Assistance with cooking/housework;Assist for transportation;Help with stairs or ramp for entrance    Equipment Recommendations None recommended by PT (pt reports having DME in home setting)  Recommendations for Other Services       Functional Status Assessment Patient has had a recent decline in their functional status and demonstrates the ability to make  significant improvements in function in a reasonable and predictable amount of time.     Precautions / Restrictions Precautions Precautions: Knee;Fall Restrictions Weight Bearing Restrictions: No      Mobility  Bed Mobility Overal bed mobility: Needs Assistance Bed Mobility: Supine to Sit     Supine to sit: Supervision     General bed mobility comments: HOB elevated and min cues    Transfers Overall transfer level: Needs assistance Equipment used: Rolling walker (2 wheels) Transfers: Sit to/from Stand Sit to Stand: Min guard           General transfer comment: cues for proper UE and AD placement    Ambulation/Gait Ambulation/Gait assistance: Min guard Gait Distance (Feet): 56 Feet Assistive device: Rolling walker (2 wheels) Gait Pattern/deviations: Step-to pattern (step almost through pattern wtih good B foot clearance) Gait velocity: decreased     General Gait Details: '  Stairs            Wheelchair Mobility    Modified Rankin (Stroke Patients Only)       Balance Overall balance assessment: Needs assistance Sitting-balance support: Feet supported Sitting balance-Leahy Scale: Fair     Standing balance support: Single extremity supported Standing balance-Leahy Scale: Poor                               Pertinent Vitals/Pain Pain Assessment Pain Assessment: 0-10 Pain Score: 0-No pain Pain Location: L knee Pain Descriptors / Indicators: Other (Comment) (pt reported no pain at rest and no pain with mobility) Pain Intervention(s): Limited activity within patient's tolerance, Monitored during session, Premedicated before session, Ice applied    Home Living  Family/patient expects to be discharged to:: Private residence Living Arrangements: Alone Available Help at Discharge: Family;Friend(s);Neighbor (son will be able to assist pt for 1 wk then sister for 2 additoinal wks) Type of Home: House Home Access: Stairs to enter Entrance  Stairs-Rails: Can reach both Entrance Stairs-Number of Steps: 2   Home Layout: One level Home Equipment: Shower seat - built in;Cane - Holiday representative (2 wheels)      Prior Function Prior Level of Function : Independent/Modified Independent;Driving             Mobility Comments: IND no AD completing all ADLs, self care tasks and IADLs       Hand Dominance        Extremity/Trunk Assessment        Lower Extremity Assessment Lower Extremity Assessment: LLE deficits/detail LLE Deficits / Details: ankle DF/PF 5/5, SLR < 10 degree lag LLE Sensation: decreased light touch       Communication   Communication: No difficulties  Cognition Arousal/Alertness: Awake/alert Behavior During Therapy: WFL for tasks assessed/performed Overall Cognitive Status: Within Functional Limits for tasks assessed                                          General Comments      Exercises Total Joint Exercises Ankle Circles/Pumps: AROM, Both, 20 reps Quad Sets: AROM, Left, 5 reps Gluteal Sets: AROM, Both, 5 reps Heel Slides: AROM, Left, 5 reps Straight Leg Raises: AROM, Left, 5 reps Long Arc Quad: AROM, Left, 5 reps   Assessment/Plan    PT Assessment Patient needs continued PT services  PT Problem List Decreased strength;Decreased range of motion;Decreased activity tolerance;Decreased balance;Decreased mobility;Decreased coordination;Decreased knowledge of use of DME       PT Treatment Interventions DME instruction;Gait training;Stair training;Functional mobility training;Therapeutic activities;Therapeutic exercise;Balance training;Neuromuscular re-education;Patient/family education;Modalities    PT Goals (Current goals can be found in the Care Plan section)  Acute Rehab PT Goals Patient Stated Goal: to be more mobile PT Goal Formulation: With patient Time For Goal Achievement: 06/15/22 Potential to Achieve Goals: Good    Frequency 7X/week      Co-evaluation               AM-PAC PT "6 Clicks" Mobility  Outcome Measure Help needed turning from your back to your side while in a flat bed without using bedrails?: None Help needed moving from lying on your back to sitting on the side of a flat bed without using bedrails?: A Little Help needed moving to and from a bed to a chair (including a wheelchair)?: A Little Help needed standing up from a chair using your arms (e.g., wheelchair or bedside chair)?: A Little Help needed to walk in hospital room?: A Little Help needed climbing 3-5 steps with a railing? : Total 6 Click Score: 17    End of Session Equipment Utilized During Treatment: Gait belt Activity Tolerance: Patient tolerated treatment well;No increased pain Patient left: in chair;with call bell/phone within reach;with chair alarm set;with nursing/sitter in room Nurse Communication: Mobility status PT Visit Diagnosis: Unsteadiness on feet (R26.81);Other abnormalities of gait and mobility (R26.89);Muscle weakness (generalized) (M62.81);Difficulty in walking, not elsewhere classified (R26.2)    Time: [id number] PT Time Calculation (min) (ACUTE ONLY): 43 min   Charges:   PT Evaluation $PT Eval Low Complexity: 1 Low PT Treatments $Gait Training: 8-22 mins $Therapeutic Activity: 8-22 mins  Baird Lyons, PT   Adair Patter 06/01/2022, 2:34 PM

## 2022-06-01 NOTE — Anesthesia Procedure Notes (Signed)
Procedure Name: MAC Date/Time: 06/01/2022 7:06 AM  Performed by: Victoriano Lain, CRNAPre-anesthesia Checklist: Patient identified, Emergency Drugs available, Suction available, Patient being monitored and Timeout performed Patient Re-evaluated:Patient Re-evaluated prior to induction Oxygen Delivery Method: Simple face mask Placement Confirmation: positive ETCO2 Dental Injury: Teeth and Oropharynx as per pre-operative assessment

## 2022-06-01 NOTE — Transfer of Care (Signed)
Immediate Anesthesia Transfer of Care Note  Patient: Tiffany Hood  Procedure(s) Performed: TOTAL KNEE ARTHROPLASTY (Left: Knee)  Patient Location: PACU  Anesthesia Type:MAC and Spinal  Level of Consciousness: awake, alert , oriented, and patient cooperative  Airway & Oxygen Therapy: Patient Spontanous Breathing and Patient connected to face mask oxygen  Post-op Assessment: Report given to RN and Post -op Vital signs reviewed and stable  Post vital signs: Reviewed and stable  Last Vitals:  Vitals Value Taken Time  BP 97/50 06/01/22 0849  Temp    Pulse 77 06/01/22 0850  Resp 13 06/01/22 0850  SpO2 100 % 06/01/22 0850  Vitals shown include unvalidated device data.  Last Pain:  Vitals:   06/01/22 0554  TempSrc: Oral  PainSc:          Complications: No notable events documented.

## 2022-06-01 NOTE — Anesthesia Postprocedure Evaluation (Signed)
Anesthesia Post Note  Patient: Tiffany Hood  Procedure(s) Performed: TOTAL KNEE ARTHROPLASTY (Left: Knee)     Patient location during evaluation: PACU Anesthesia Type: Spinal and MAC Level of consciousness: awake and alert Pain management: pain level controlled Vital Signs Assessment: post-procedure vital signs reviewed and stable Respiratory status: spontaneous breathing and respiratory function stable Cardiovascular status: blood pressure returned to baseline and stable Postop Assessment: spinal receding Anesthetic complications: no  No notable events documented.  Last Vitals:  Vitals:   06/01/22 1045 06/01/22 1115  BP: (!) 108/58 110/61  Pulse: 60 62  Resp: 11 16  Temp: (!) 36.3 C (!) 36.3 C  SpO2: 100% 98%    Last Pain:  Vitals:   06/01/22 1115  TempSrc: Oral  PainSc:                  Tiajuana Amass

## 2022-06-01 NOTE — Anesthesia Procedure Notes (Signed)
Spinal  Patient location during procedure: OR Start time: 06/01/2022 7:07 AM End time: 06/01/2022 7:13 AM Reason for block: surgical anesthesia Staffing Performed: anesthesiologist  Anesthesiologist: Suzette Battiest, MD Performed by: Suzette Battiest, MD Authorized by: Suzette Battiest, MD   Preanesthetic Checklist Completed: patient identified, IV checked, site marked, risks and benefits discussed, surgical consent, monitors and equipment checked, pre-op evaluation and timeout performed Spinal Block Patient position: sitting Prep: DuraPrep Patient monitoring: heart rate, cardiac monitor, continuous pulse ox and blood pressure Approach: midline Location: L3-4 Injection technique: single-shot Needle Needle type: Pencan  Needle gauge: 24 G Needle length: 9 cm Assessment Sensory level: T4 Events: CSF return

## 2022-06-01 NOTE — H&P (Signed)
TOTAL KNEE ADMISSION H&P  Patient is being admitted for left total knee arthroplasty.  Subjective:  Chief Complaint:left knee pain.  HPI: Tiffany Hood, 82 y.o. female, has a history of pain and functional disability in the left knee due to arthritis and has failed non-surgical conservative treatments for greater than 12 weeks to includeNSAID's and/or analgesics, corticosteriod injections, and activity modification.  Onset of symptoms was gradual, starting 2 years ago with gradually worsening course since that time. The patient noted no past surgery on the left knee(s).  Patient currently rates pain in the left knee(s) at 8 out of 10 with activity. Patient has worsening of pain with activity and weight bearing and pain that interferes with activities of daily living.  Patient has evidence of joint space narrowing by imaging studies.  There is no active infection.  Patient Active Problem List   Diagnosis Date Noted   Preop examination 04/19/2022   Glaucoma 04/10/2021   Osteoarthritis of left knee 04/10/2021   Multiple thyroid nodules --stable for 5 years-no follow-up needed 08/12/2016   Diabetes ([place]) 08/08/2015   Spinal stenosis, lumbar region, with neurogenic claudication 02/19/2015   Osteopenia 03/30/2013   Diverticulitis of sigmoid colon s/p sigmoidectomy 10/05/2012   Diverticulosis 10/19/2011   Osteoarthritis 01/28/2010   Hyperlipidemia 12/25/2007   Essential hypertension 02/10/2007   History of cancer of left breast 05/02/2002   Past Medical History:  Diagnosis Date   Breast cancer ([place]) left-2004 sx   Diabetes mellitus    TYPE 2   Diverticulitis    09/14/12   Diverticulosis    Heart murmur    History of radiation therapy    History of urinary tract infection    Hyperlipidemia    Hypertension    Osteoporosis    Fosamax Rxed 2/13   Personal history of colonic adenomas 11/15/2012   Personal history of radiation therapy 06/2002   Pneumonia    2013   Seasonal allergies     Tingling    right leg     Past Surgical History:  Procedure Laterality Date   ABDOMINAL HYSTERECTOMY  1965   ovaries remain; for dysfunctional menses; ? endometriosis   ABDOMINAL SURGERY     BREAST BIOPSY Left 04/16/2002   malignant   BREAST LUMPECTOMY Left    colon polyps  2014   Dr Carlean Purl   COLONOSCOPY  2004    Tics   DILATION AND CURETTAGE OF UTERUS     LAPAROSCOPIC SIGMOID COLECTOMY  04/13/2013   DR Dudley N/A 04/13/2013   Procedure: LAPAROSCOPIC ASSISTED SIGMOID COLECTOMY;  Surgeon: Merrie Roof, MD;  Location: [facility];  Service: General;  Laterality: N/A;   LUMBAR LAMINECTOMY/DECOMPRESSION MICRODISCECTOMY N/A 02/19/2015   Procedure: MICRODISCECTOMY AT L4-L5, CENTRAL DECOMPRESSION L4-L5 FOR SPINAL STENOSIS, FORAMINOTOMY FOR L4 L5 ROOT ON RIGHT;  Surgeon: Latanya Maudlin, MD;  Location: [facility];  Service: Orthopedics;  Laterality: N/A;   removal bone spur Left 2/14   2nd finger   TONSILLECTOMY AND ADENOIDECTOMY      Current Facility-Administered Medications  Medication Dose Route Frequency Provider Last Rate Last Admin   ceFAZolin (ANCEF) IVPB 2g/100 mL premix  2 g Intravenous On Call to OR Irving Copas, PA-C       dexamethasone (DECADRON) injection 8 mg  8 mg Intravenous Once Irving Copas, PA-C       lactated ringers infusion   Intravenous Continuous Irving Copas, PA-C       lactated  ringers infusion   Intravenous Continuous Pervis Hocking, DO 10 mL/hr at 06/01/22 [id number] Continued from Pre-op at 06/01/22 0556   tranexamic acid (CYKLOKAPRON) IVPB 1,000 mg  1,000 mg Intravenous To OR Irving Copas, PA-C       Allergies  Allergen Reactions   Celestone [Betamethasone] Nausea Only    Sweated; face and chest with redness for several days     Social History   Tobacco Use   Smoking status: Never   Smokeless tobacco: Never  Substance Use Topics   Alcohol use: No    Alcohol/week: 0.0 standard drinks of alcohol     Family History  Problem Relation Age of Onset   Stroke Mother 49   Hypertension Mother    Lung cancer Father        smoker   Cancer - Lung Father    Hypertension Brother    Heart disease Brother 60   Stroke Brother 58   Hypertension Brother    Hypertension Sister    Diabetes Neg Hx    Colon cancer Neg Hx    Esophageal cancer Neg Hx    Rectal cancer Neg Hx    Stomach cancer Neg Hx    Colon polyps Neg Hx      Review of Systems  Constitutional:  Negative for chills and fever.  Respiratory:  Negative for cough and shortness of breath.   Cardiovascular:  Negative for chest pain.  Gastrointestinal:  Negative for nausea and vomiting.  Musculoskeletal:  Positive for arthralgias.     Objective:  Physical Exam Well nourished and well developed. General: Alert and oriented x3, cooperative and pleasant, no acute distress. Head: normocephalic, atraumatic, neck supple. Eyes: EOMI.  Musculoskeletal: Left knee exam: No palpable effusion, warmth erythema Slight flexion contracture associated with mild genu varum Tenderness medially Stable medial and lateral collateral ligaments Normal ipsilateral left hip exam without groin pain or referred pain  Calves soft and nontender. Motor function intact in LE. Strength 5/5 LE bilaterally. Neuro: Distal pulses 2+. Sensation to light touch intact in LE.  Vital signs in last 24 hours: Temp:  [98.2 F (36.8 C)] 98.2 F (36.8 C) (03/05 0554) Pulse Rate:  [73] 73 (03/05 0554) Resp:  [14] 14 (03/05 0554) BP: (112)/(65) 112/65 (03/05 0554) SpO2:  [95 %] 95 % (03/05 0554) Weight:  [69.4 kg] 69.4 kg (03/05 0542)  Labs:   Estimated body mass index is 28.44 kg/m as calculated from the following:   Height as of 05/19/22: 5' 1.5" (1.562 m).   Weight as of this encounter: 69.4 kg.   Imaging Review Plain radiographs demonstrate severe degenerative joint disease of the left knee(s). The overall alignment isneutral. The bone quality appears  to be adequate for age and reported activity level.      Assessment/Plan:  End stage arthritis, left knee   The patient history, physical examination, clinical judgment of the provider and imaging studies are consistent with end stage degenerative joint disease of the left knee(s) and total knee arthroplasty is deemed medically necessary. The treatment options including medical management, injection therapy arthroscopy and arthroplasty were discussed at length. The risks and benefits of total knee arthroplasty were presented and reviewed. The risks due to aseptic loosening, infection, stiffness, patella tracking problems, thromboembolic complications and other imponderables were discussed. The patient acknowledged the explanation, agreed to proceed with the plan and consent was signed. Patient is being admitted for inpatient treatment for surgery, pain control, PT, OT, prophylactic antibiotics, VTE prophylaxis, progressive  ambulation and ADL's and discharge planning. The patient is planning to be discharged  home.  Therapy Plans: outpatient therapy at [facility] Disposition: Home with sister & son (son for a week, sister for a few weeks) Planned DVT Prophylaxis: aspirin '81mg'$  BID DME needed: none PCP: Dr. Billey Gosling, clearance received TXA: IV Allergies: Celestone - rash Anesthesia Concerns: none BMI: 29 Last HgbA1c: 6.5%   Other: - oxycodone, robaxin, tylenol, meloxicam - No hx of VTE, hx of breast CA 2004    Patient's anticipated LOS is less than 2 midnights, meeting these requirements: - Younger than 71 - Lives within 1 hour of care - Has a competent adult at home to recover with post-op recover - NO history of  - Chronic pain requiring opiods  - Diabetes  - Coronary Artery Disease  - Heart failure  - Heart attack  - Stroke  - DVT/VTE  - Cardiac arrhythmia  - Respiratory Failure/COPD  - Renal failure  - Anemia  - Advanced Liver disease  Costella Hatcher, PA-C Orthopedic  Surgery [facility] [place] [phone]

## 2022-06-01 NOTE — Anesthesia Preprocedure Evaluation (Addendum)
Anesthesia Evaluation  Patient identified by MRN, date of birth, ID band Patient awake    Reviewed: Allergy & Precautions, NPO status , Patient's Chart, lab work & pertinent test results  Airway Mallampati: II  TM Distance: >3 FB     Dental  (+) Dental Advisory Given   Pulmonary neg pulmonary ROS   breath sounds clear to auscultation       Cardiovascular hypertension, Pt. on medications  Rhythm:Regular Rate:Normal     Neuro/Psych negative neurological ROS     GI/Hepatic negative GI ROS, Neg liver ROS,,,  Endo/Other  diabetes, Type 2    Renal/GU negative Renal ROS     Musculoskeletal  (+) Arthritis ,    Abdominal   Peds  Hematology negative hematology ROS (+)   Anesthesia Other Findings   Reproductive/Obstetrics                             Anesthesia Physical Anesthesia Plan  ASA: 3  Anesthesia Plan: Spinal and MAC   Post-op Pain Management: Tylenol PO (pre-op)* and Regional block*   Induction:   PONV Risk Score and Plan: 2 and Propofol infusion, Ondansetron and Dexamethasone  Airway Management Planned: Natural Airway and Simple Face Mask  Additional Equipment:   Intra-op Plan:   Post-operative Plan:   Informed Consent: I have reviewed the patients History and Physical, chart, labs and discussed the procedure including the risks, benefits and alternatives for the proposed anesthesia with the patient or authorized representative who has indicated his/her understanding and acceptance.       Plan Discussed with: CRNA  Anesthesia Plan Comments:        Anesthesia Quick Evaluation

## 2022-06-01 NOTE — Plan of Care (Signed)
  Problem: Education: Goal: Knowledge of the prescribed therapeutic regimen will improve Outcome: Progressing   Problem: Activity: Goal: Range of joint motion will improve Outcome: Progressing   Problem: Pain Management: Goal: Pain level will decrease with appropriate interventions Outcome: Progressing   Problem: Safety: Goal: Ability to remain free from injury will improve Outcome: Progressing   

## 2022-06-01 NOTE — Discharge Instructions (Signed)

## 2022-06-01 NOTE — Interval H&P Note (Signed)
History and Physical Interval Note:  06/01/2022 6:58 AM  Tiffany Hood  has presented today for surgery, with the diagnosis of Left knee osteoarthritis.  The various methods of treatment have been discussed with the patient and family. After consideration of risks, benefits and other options for treatment, the patient has consented to  Procedure(s): TOTAL KNEE ARTHROPLASTY (Left) as a surgical intervention.  The patient's history has been reviewed, patient examined, no change in status, stable for surgery.  I have reviewed the patient's chart and labs.  Questions were answered to the patient's satisfaction.     [name]

## 2022-06-02 ENCOUNTER — Encounter (HOSPITAL_COMMUNITY): Payer: Self-pay | Admitting: Orthopedic Surgery

## 2022-06-02 DIAGNOSIS — M1712 Unilateral primary osteoarthritis, left knee: Secondary | ICD-10-CM | POA: Diagnosis not present

## 2022-06-02 DIAGNOSIS — E119 Type 2 diabetes mellitus without complications: Secondary | ICD-10-CM | POA: Diagnosis not present

## 2022-06-02 DIAGNOSIS — Z853 Personal history of malignant neoplasm of breast: Secondary | ICD-10-CM | POA: Diagnosis not present

## 2022-06-02 DIAGNOSIS — I10 Essential (primary) hypertension: Secondary | ICD-10-CM | POA: Diagnosis not present

## 2022-06-02 LAB — BASIC METABOLIC PANEL
Anion gap: 7 (ref 5–15)
BUN: 27 mg/dL — ABNORMAL HIGH (ref 8–23)
CO2: 23 mmol/L (ref 22–32)
Calcium: 8.5 mg/dL — ABNORMAL LOW (ref 8.9–10.3)
Chloride: 106 mmol/L (ref 98–111)
Creatinine, Ser: 0.77 mg/dL (ref 0.44–1.00)
GFR, Estimated: >60 mL/min (ref >60)
Glucose, Bld: 120 mg/dL — ABNORMAL HIGH (ref 70–99)
Potassium: 4 mmol/L (ref 3.5–5.1)
Sodium: 136 mmol/L (ref 135–145)

## 2022-06-02 LAB — CBC
HCT: 35.7 % — ABNORMAL LOW (ref 36.0–46.0)
Hemoglobin: 11.5 g/dL — ABNORMAL LOW (ref 12.0–15.0)
MCH: 29.3 pg (ref 26.0–34.0)
MCHC: 32.2 g/dL (ref 30.0–36.0)
MCV: 90.8 fL (ref 80.0–100.0)
Platelets: 248 10*3/uL (ref 150–400)
RBC: 3.93 MIL/uL (ref 3.87–5.11)
RDW: 15.1 % (ref 11.5–15.5)
WBC: 10.1 10*3/uL (ref 4.0–10.5)
nRBC: 0 % (ref 0.0–0.2)

## 2022-06-02 LAB — GLUCOSE, CAPILLARY: Glucose-Capillary: 120 mg/dL — ABNORMAL HIGH (ref 70–99)

## 2022-06-02 MED ORDER — SENNA 8.6 MG PO TABS: 1.0000 | ORAL_TABLET | Freq: Every day | ORAL | 0 refills | Status: AC

## 2022-06-02 MED ORDER — OXYCODONE HCL 5 MG PO TABS: 5.0000 mg | ORAL_TABLET | ORAL | 0 refills | Status: DC | PRN

## 2022-06-02 MED ORDER — POLYETHYLENE GLYCOL 3350 17 G PO PACK: 17.0000 g | PACK | Freq: Two times a day (BID) | ORAL | 0 refills | Status: DC

## 2022-06-02 MED ORDER — ASPIRIN 81 MG PO CHEW: 81.0000 mg | CHEWABLE_TABLET | Freq: Two times a day (BID) | ORAL | 0 refills | Status: AC

## 2022-06-02 MED ORDER — METHOCARBAMOL 500 MG PO TABS: 500.0000 mg | ORAL_TABLET | Freq: Four times a day (QID) | ORAL | 2 refills | Status: DC | PRN

## 2022-06-02 NOTE — Progress Notes (Signed)
Subjective: 1 Day Post-Op Procedure(s) (LRB): TOTAL KNEE ARTHROPLASTY (Left) Patient reports pain as mild.   Patient seen in rounds with Dr. Alvan Dame. Patient is well, and has had no acute complaints or problems. No acute events overnight. Foley catheter removed. Patient ambulated 56 feet with PT.  We will start therapy today.   Objective: Vital signs in last 24 hours: Temp:  [97.3 F (36.3 C)-97.9 F (36.6 C)] 97.4 F (36.3 C) (03/06 0544) Pulse Rate:  [59-77] 64 (03/06 0544) Resp:  [10-20] 18 (03/06 0544) BP: (97-130)/(45-77) 121/63 (03/06 0544) SpO2:  [93 %-100 %] 94 % (03/06 0544) Weight:  [69.4 kg] 69.4 kg (03/05 2031)  Intake/Output from previous day:  Intake/Output Summary (Last 24 hours) at 06/02/2022 0744 Last data filed at 06/02/2022 0600 Gross per 24 hour  Intake 2405.39 ml  Output 1700 ml  Net 705.39 ml     Intake/Output this shift: No intake/output data recorded.  Labs: Recent Labs    06/02/22 0338  HGB 11.5*   Recent Labs    06/02/22 0338  WBC 10.1  RBC 3.93  HCT 35.7*  PLT 248   Recent Labs    06/02/22 0338  NA 136  K 4.0  CL 106  CO2 23  BUN 27*  CREATININE 0.77  GLUCOSE 120*  CALCIUM 8.5*   No results for input(s): "LABPT", "INR" in the last 72 hours.  Exam: General - Patient is Alert and Oriented Extremity - Neurologically intact Sensation intact distally Intact pulses distally Dorsiflexion/Plantar flexion intact Dressing - dressing C/D/I Motor Function - intact, moving foot and toes well on exam.   Past Medical History:  Diagnosis Date   Breast cancer ([place]) left-2004 sx   Diabetes mellitus    TYPE 2   Diverticulitis    09/14/12   Diverticulosis    Heart murmur    History of radiation therapy    History of urinary tract infection    Hyperlipidemia    Hypertension    Osteoporosis    Fosamax Rxed 2/13   Personal history of colonic adenomas 11/15/2012   Personal history of radiation therapy 06/2002   Pneumonia    2013    Seasonal allergies    Tingling    right leg     Assessment/Plan: 1 Day Post-Op Procedure(s) (LRB): TOTAL KNEE ARTHROPLASTY (Left) Principal Problem:   S/P total knee arthroplasty, left  Estimated body mass index is 28.44 kg/m as calculated from the following:   Height as of this encounter: 5' 1.5" (1.562 m).   Weight as of this encounter: 69.4 kg. Advance diet Up with therapy D/C IV fluids   Patient's anticipated LOS is less than 2 midnights, meeting these requirements: - Younger than 67 - Lives within 1 hour of care - Has a competent adult at home to recover with post-op recover - NO history of  - Chronic pain requiring opiods  - Diabetes  - Coronary Artery Disease  - Heart failure  - Heart attack  - Stroke  - DVT/VTE  - Cardiac arrhythmia  - Respiratory Failure/COPD  - Renal failure  - Anemia  - Advanced Liver disease     DVT Prophylaxis - Aspirin Weight bearing as tolerated.  Hgb stable at 11.5 this AM.  Plan is to go Home after hospital stay. Plan for discharge today following 1-2 sessions of PT as long as they are meeting their goals. Patient is scheduled for OPPT. Follow up in the office in 2 weeks.   Griffith Citron,  PA-C Orthopedic Surgery [phone] 06/02/2022, 7:44 AM

## 2022-06-02 NOTE — TOC Transition Note (Signed)
Transition of Care [facility]) - CM/SW Discharge Note  Patient Details  Name: Tiffany Hood MRN: [id number] Date of Birth: April 20, 1940  [facility]) CM/SW Contact:  Sherie Don, LCSW Phone Number: 06/02/2022, 8:54 AM  Clinical Narrative: Patient is expected to discharge home after working with PT. CSW met with patient to confirm discharge plan. Patient will go home with OPPT at [facility]. Patient has a rolling walker and cane at home, so there are no DME needs at this time. TOC signing off.  Final next level of care: OP Rehab Barriers to Discharge: No Barriers Identified  Patient Goals and CMS Choice Choice offered to / list presented to : NA  Discharge Plan and Services Additional resources added to the After Visit Summary for            DME Arranged: N/A DME Agency: NA HH Arranged: NA [place] Agency: NA  Social Determinants of Health (SDOH) Interventions SDOH Screenings   Food Insecurity: No Food Insecurity (06/01/2022)  Housing: Low Risk  (06/01/2022)  Transportation Needs: No Transportation Needs (06/01/2022)  Utilities: Not At Risk (06/01/2022)  Alcohol Screen: Low Risk  (07/21/2020)  Depression (PHQ2-9): Low Risk  (10/16/2021)  Financial Resource Strain: Low Risk  (07/21/2020)  Physical Activity: Sufficiently Active (07/21/2020)  Social Connections: Moderately Integrated (07/21/2020)  Stress: No Stress Concern Present (07/21/2020)  Tobacco Use: Low Risk  (06/01/2022)   Readmission Risk Interventions     No data to display

## 2022-06-02 NOTE — Progress Notes (Signed)
Physical Therapy Treatment Patient Details Name: Tiffany Hood MRN: [id number] DOB: [birth year] Today's Date: 06/02/2022   History of Present Illness 82 yo female presents to therapy s/p L TKA on 06/01/2022 due to failure of conservative measures. Pt PMH includes but is not limited to: glaucoma, thyroid nodules, DM II, spinal stenosis, diverticulitis, HDL, HTN, and L BrCa.    PT Comments    Pt is mobilizing well and has met PT goals, she's ready to DC home from a PT standpoint. She ambulated 180' with RW, completed stair training, and demonstrates good understanding of HEP.    Recommendations for follow up therapy are one component of a multi-disciplinary discharge planning process, led by the attending physician.  Recommendations may be updated based on patient status, additional functional criteria and insurance authorization.  Follow Up Recommendations  Outpatient PT     Assistance Recommended at Discharge Intermittent Supervision/Assistance  Patient can return home with the following A little help with walking and/or transfers;A little help with bathing/dressing/bathroom;Assistance with cooking/housework;Assist for transportation;Help with stairs or ramp for entrance   Equipment Recommendations  None recommended by PT (pt reports having DME in home setting)    Recommendations for Other Services       Precautions / Restrictions Precautions Precautions: Knee;Fall Precaution Booklet Issued: Yes (comment) Precaution Comments: reviewed no pillow under knee Restrictions Weight Bearing Restrictions: No     Mobility  Bed Mobility Overal bed mobility: Modified Independent Bed Mobility: Supine to Sit     Supine to sit: Modified independent (Device/Increase time), HOB elevated          Transfers Overall transfer level: Needs assistance Equipment used: Rolling walker (2 wheels) Transfers: Sit to/from Stand Sit to Stand: Supervision           General transfer comment:  cues for proper UE and AD placement    Ambulation/Gait Ambulation/Gait assistance: Supervision Gait Distance (Feet): 180 Feet Assistive device: Rolling walker (2 wheels) Gait Pattern/deviations: Step-to pattern, Decreased step length - right, Decreased step length - left Gait velocity: decreased     General Gait Details: VCs sequencing initially, no buckling, no loss of balance   Stairs Stairs: Yes Stairs assistance: Supervision Stair Management: One rail Left, Step to pattern, Sideways Number of Stairs: 4 General stair comments: 2 steps x 2 trials, VCs sequencing   Wheelchair Mobility    Modified Rankin (Stroke Patients Only)       Balance Overall balance assessment: Needs assistance Sitting-balance support: Feet supported Sitting balance-Leahy Scale: Fair     Standing balance support: Single extremity supported Standing balance-Leahy Scale: Poor                              Cognition Arousal/Alertness: Awake/alert Behavior During Therapy: WFL for tasks assessed/performed Overall Cognitive Status: Within Functional Limits for tasks assessed                                          Exercises Total Joint Exercises Ankle Circles/Pumps: AROM, Both, 20 reps Quad Sets: AROM, Left, 5 reps Gluteal Sets: AROM Short Arc Quad: AROM, Left, 10 reps, Supine Heel Slides: AROM, Left, 10 reps, Supine Hip ABduction/ADduction: AROM, Left, 10 reps, Supine Straight Leg Raises: AROM, Left, 5 reps Long Arc Quad: AROM, Left, 10 reps, Seated Knee Flexion: AROM, Left, 10 reps, Seated Goniometric ROM: 5-65* L  knee AAROM    General Comments        Pertinent Vitals/Pain Pain Assessment Pain Score: 3  Pain Location: L knee Pain Descriptors / Indicators: Sore Pain Intervention(s): Limited activity within patient's tolerance, Monitored during session, Premedicated before session, Ice applied    Home Living                          Prior  Function            PT Goals (current goals can now be found in the care plan section) Acute Rehab PT Goals Patient Stated Goal: to be more mobile PT Goal Formulation: All assessment and education complete, DC therapy Time For Goal Achievement: 06/15/22 Potential to Achieve Goals: Good Progress towards PT goals: Progressing toward goals    Frequency    7X/week      PT Plan Current plan remains appropriate    Co-evaluation              AM-PAC PT "6 Clicks" Mobility   Outcome Measure  Help needed turning from your back to your side while in a flat bed without using bedrails?: None Help needed moving from lying on your back to sitting on the side of a flat bed without using bedrails?: A Little Help needed moving to and from a bed to a chair (including a wheelchair)?: None Help needed standing up from a chair using your arms (e.g., wheelchair or bedside chair)?: None Help needed to walk in hospital room?: None Help needed climbing 3-5 steps with a railing? : A Little 6 Click Score: 22    End of Session Equipment Utilized During Treatment: Gait belt Activity Tolerance: Patient tolerated treatment well;No increased pain Patient left: in chair;with call bell/phone within reach;with chair alarm set Nurse Communication: Mobility status PT Visit Diagnosis: Unsteadiness on feet (R26.81);Other abnormalities of gait and mobility (R26.89);Muscle weakness (generalized) (M62.81);Difficulty in walking, not elsewhere classified (R26.2)     Time: [id number] PT Time Calculation (min) (ACUTE ONLY): 29 min  Charges:  $Gait Training: 8-22 mins $Therapeutic Exercise: 8-22 mins                     Blondell Reveal Kistler PT 06/02/2022  Acute Rehabilitation Services  Office [phone]

## 2022-06-02 NOTE — Plan of Care (Signed)
  Problem: Education: Goal: Ability to describe self-care measures that may prevent or decrease complications (Diabetes Survival Skills Education) will improve Outcome: Adequate for Discharge Goal: Individualized Educational Video(s) Outcome: Adequate for Discharge   Problem: Coping: Goal: Ability to adjust to condition or change in health will improve Outcome: Adequate for Discharge   Problem: Fluid Volume: Goal: Ability to maintain a balanced intake and output will improve Outcome: Adequate for Discharge   Problem: Health Behavior/Discharge Planning: Goal: Ability to identify and utilize available resources and services will improve Outcome: Adequate for Discharge Goal: Ability to manage health-related needs will improve Outcome: Adequate for Discharge   Problem: Metabolic: Goal: Ability to maintain appropriate glucose levels will improve Outcome: Adequate for Discharge   Problem: Nutritional: Goal: Maintenance of adequate nutrition will improve Outcome: Adequate for Discharge Goal: Progress toward achieving an optimal weight will improve Outcome: Adequate for Discharge   Problem: Skin Integrity: Goal: Risk for impaired skin integrity will decrease Outcome: Adequate for Discharge   Problem: Tissue Perfusion: Goal: Adequacy of tissue perfusion will improve Outcome: Adequate for Discharge   Problem: Education: Goal: Knowledge of the prescribed therapeutic regimen will improve Outcome: Adequate for Discharge Goal: Individualized Educational Video(s) Outcome: Adequate for Discharge   Problem: Activity: Goal: Ability to avoid complications of mobility impairment will improve Outcome: Adequate for Discharge Goal: Range of joint motion will improve Outcome: Adequate for Discharge   Problem: Clinical Measurements: Goal: Postoperative complications will be avoided or minimized Outcome: Adequate for Discharge   Problem: Pain Management: Goal: Pain level will decrease  with appropriate interventions Outcome: Adequate for Discharge   Problem: Skin Integrity: Goal: Will show signs of wound healing Outcome: Adequate for Discharge   Problem: Education: Goal: Knowledge of General Education information will improve Description: Including pain rating scale, medication(s)/side effects and non-pharmacologic comfort measures Outcome: Adequate for Discharge   Problem: Health Behavior/Discharge Planning: Goal: Ability to manage health-related needs will improve Outcome: Adequate for Discharge   Problem: Clinical Measurements: Goal: Ability to maintain clinical measurements within normal limits will improve Outcome: Adequate for Discharge Goal: Will remain free from infection Outcome: Adequate for Discharge Goal: Diagnostic test results will improve Outcome: Adequate for Discharge Goal: Respiratory complications will improve Outcome: Adequate for Discharge Goal: Cardiovascular complication will be avoided Outcome: Adequate for Discharge   Problem: Activity: Goal: Risk for activity intolerance will decrease Outcome: Adequate for Discharge   Problem: Nutrition: Goal: Adequate nutrition will be maintained Outcome: Adequate for Discharge   Problem: Coping: Goal: Level of anxiety will decrease Outcome: Adequate for Discharge   Problem: Elimination: Goal: Will not experience complications related to bowel motility Outcome: Adequate for Discharge Goal: Will not experience complications related to urinary retention Outcome: Adequate for Discharge   Problem: Pain Managment: Goal: General experience of comfort will improve Outcome: Adequate for Discharge   Problem: Safety: Goal: Ability to remain free from injury will improve Outcome: Adequate for Discharge   Problem: Skin Integrity: Goal: Risk for impaired skin integrity will decrease Outcome: Adequate for Discharge

## 2022-06-07 DIAGNOSIS — M25562 Pain in left knee: Secondary | ICD-10-CM | POA: Diagnosis not present

## 2022-06-09 DIAGNOSIS — M25562 Pain in left knee: Secondary | ICD-10-CM | POA: Diagnosis not present

## 2022-06-11 ENCOUNTER — Telehealth: Payer: Self-pay | Admitting: Internal Medicine

## 2022-06-11 ENCOUNTER — Telehealth: Payer: Medicare Other | Admitting: Physician Assistant

## 2022-06-11 DIAGNOSIS — J069 Acute upper respiratory infection, unspecified: Secondary | ICD-10-CM | POA: Diagnosis not present

## 2022-06-11 MED ORDER — BENZONATATE 100 MG PO CAPS: 100.0000 mg | ORAL_CAPSULE | Freq: Three times a day (TID) | ORAL | 0 refills | Status: DC | PRN

## 2022-06-11 NOTE — Progress Notes (Signed)
I have spent 5 minutes in review of e-visit questionnaire, review and updating patient chart, medical decision making and response to patient.   Skyy Nilan Cody Jaton Eilers, PA-C    

## 2022-06-11 NOTE — Progress Notes (Signed)
E-Visit for Upper Respiratory Infection   We are sorry you are not feeling well.  Here is how we plan to help!  Based on what you have shared with me, it looks like you may have a viral upper respiratory infection.  Upper respiratory infections are caused by a large number of viruses; however, rhinovirus is the most common cause.   Symptoms vary from person to person, with common symptoms including sore throat, cough, fatigue or lack of energy and feeling of general discomfort.  A low-grade fever of up to 100.4 may present, but is often uncommon.  Symptoms vary however, and are closely related to a person's age or underlying illnesses.  The most common symptoms associated with an upper respiratory infection are nasal discharge or congestion, cough, sneezing, headache and pressure in the ears and face.  These symptoms usually persist for about 3 to 10 days, but can last up to 2 weeks.  It is important to know that upper respiratory infections do not cause serious illness or complications in most cases.    Upper respiratory infections can be transmitted from person to person, with the most common method of transmission being a person's hands.  The virus is able to live on the skin and can infect other persons for up to 2 hours after direct contact.  Also, these can be transmitted when someone coughs or sneezes; thus, it is important to cover the mouth to reduce this risk.  To keep the spread of the illness at [place], good hand hygiene is very important.  This is an infection that is most likely caused by a virus. There are no specific treatments other than to help you with the symptoms until the infection runs its course.  We are sorry you are not feeling well.  Here is how we plan to help!   For nasal congestion, you may use an oral decongestants such as Mucinex D or if you have glaucoma or high blood pressure use plain Mucinex.  Saline nasal spray or nasal drops can help and can safely be used as often as  needed for congestion.    If you do not have a history of heart disease, hypertension, diabetes or thyroid disease, prostate/bladder issues or glaucoma, you may also use Sudafed to treat nasal congestion.  It is highly recommended that you consult with a pharmacist or your primary care physician to ensure this medication is safe for you to take.     If you have a cough, you may use cough suppressants such as Delsym and Robitussin.  If you have glaucoma or high blood pressure, you can also use Coricidin HBP.   For cough I have prescribed for you A prescription cough medication called Tessalon Perles 100 mg. You may take 1-2 capsules every 8 hours as needed for cough  If you have a sore or scratchy throat, use a saltwater gargle-  to  teaspoon of salt dissolved in a 4-ounce to 8-ounce glass of warm water.  Gargle the solution for approximately 15-30 seconds and then spit.  It is important not to swallow the solution.  You can also use throat lozenges/cough drops and Chloraseptic spray to help with throat pain or discomfort.  Warm or cold liquids can also be helpful in relieving throat pain.  For headache, pain or general discomfort, you can use Ibuprofen or Tylenol as directed.   Some authorities believe that zinc sprays or the use of Echinacea may shorten the course of your symptoms.  Your sister would need to schedule a visit for herself either via Evisit or a video visit. She can go to [email or web address] if she does not have a MyChart account to get scheduled for an evaluation.    HOME CARE Only take medications as instructed by your medical team. Be sure to drink plenty of fluids. Water is fine as well as fruit juices, sodas and electrolyte beverages. You may want to stay away from caffeine or alcohol. If you are nauseated, try taking small sips of liquids. How do you know if you are getting enough fluid? Your urine should be a pale yellow or almost colorless. Get rest. Taking a steamy  shower or using a humidifier may help nasal congestion and ease sore throat pain. You can place a towel over your head and breathe in the steam from hot water coming from a faucet. Using a saline nasal spray works much the same way. Cough drops, hard candies and sore throat lozenges may ease your cough. Avoid close contacts especially the very young and the elderly Cover your mouth if you cough or sneeze Always remember to wash your hands.   GET HELP RIGHT AWAY IF: You develop worsening fever. If your symptoms do not improve within 10 days You develop yellow or green discharge from your nose over 3 days. You have coughing fits You develop a severe head ache or visual changes. You develop shortness of breath, difficulty breathing or start having chest pain Your symptoms persist after you have completed your treatment plan  MAKE SURE YOU  Understand these instructions. Will watch your condition. Will get help right away if you are not doing well or get worse.  Thank you for choosing an e-visit.  Your e-visit answers were reviewed by a board certified advanced clinical practitioner to complete your personal care plan. Depending upon the condition, your plan could have included both over the counter or prescription medications.  Please review your pharmacy choice. Make sure the pharmacy is open so you can pick up prescription now. If there is a problem, you may contact your provider through [employer] and have the prescription routed to another pharmacy.  Your safety is important to [place]. If you have drug allergies check your prescription carefully.   For the next 24 hours you can use MyChart to ask questions about today's visit, request a non-urgent call back, or ask for a work or school excuse. You will get an email in the next two days asking about your experience. I hope that your e-visit has been valuable and will speed your recovery.

## 2022-06-11 NOTE — Telephone Encounter (Signed)
Spoke with patient today and info given.  Virtual offered but patient will do e-visit.  She has had trouble connecting in the past.

## 2022-06-11 NOTE — Telephone Encounter (Signed)
Patient has a head and sinus issue going on.  She wants to know if you would be willing to call her something in.  She just got out of the hospital with a total knee replacement.  If you will call something in please send it to :  [facility] on [facility].  Please call patient and let her know:  [phone]

## 2022-06-11 NOTE — Telephone Encounter (Signed)
Needs a visit - can be virtual or e visit

## 2022-06-14 DIAGNOSIS — M25562 Pain in left knee: Secondary | ICD-10-CM | POA: Diagnosis not present

## 2022-06-16 DIAGNOSIS — M25562 Pain in left knee: Secondary | ICD-10-CM | POA: Diagnosis not present

## 2022-06-17 NOTE — Discharge Summary (Signed)
Patient ID: Tiffany Hood MRN: [id number] DOB/AGE: 1940-06-23 82 y.o.  Admit date: 06/01/2022 Discharge date: 06/02/2022  Admission Diagnoses:  Left knee osteoarthritis  Discharge Diagnoses:  Principal Problem:   S/P total knee arthroplasty, left   Past Medical History:  Diagnosis Date   Breast cancer ([place]) left-2004 sx   Diabetes mellitus    TYPE 2   Diverticulitis    09/14/12   Diverticulosis    Heart murmur    History of radiation therapy    History of urinary tract infection    Hyperlipidemia    Hypertension    Osteoporosis    Fosamax Rxed 2/13   Personal history of colonic adenomas 11/15/2012   Personal history of radiation therapy 06/2002   Pneumonia    2013   Seasonal allergies    Tingling    right leg     Surgeries: Procedure(s): TOTAL KNEE ARTHROPLASTY on 06/01/2022   Consultants:   Discharged Condition: Improved  Hospital Course: Tiffany Hood is an 82 y.o. female who was admitted 06/01/2022 for operative treatment ofS/P total knee arthroplasty, left. Patient has severe unremitting pain that affects sleep, daily activities, and work/hobbies. After pre-op clearance the patient was taken to the operating room on 06/01/2022 and underwent  Procedure(s): TOTAL KNEE ARTHROPLASTY.    Patient was given perioperative antibiotics:  Anti-infectives (From admission, onward)    Start     Dose/Rate Route Frequency Ordered Stop   06/01/22 1400  ceFAZolin (ANCEF) IVPB 2g/100 mL premix        2 g 200 mL/hr over 30 Minutes Intravenous Every 6 hours 06/01/22 1129 06/01/22 2056   06/01/22 0600  ceFAZolin (ANCEF) IVPB 2g/100 mL premix        2 g 200 mL/hr over 30 Minutes Intravenous On call to O.R. 06/01/22 0533 06/01/22 0744        Patient was given sequential compression devices, early ambulation, and chemoprophylaxis to prevent DVT. Patient worked with PT and was meeting their goals regarding safe ambulation and transfers.  Patient benefited maximally from hospital  stay and there were no complications.    Recent vital signs: No data found.   Recent laboratory studies: No results for input(s): "WBC", "HGB", "HCT", "PLT", "NA", "K", "CL", "CO2", "BUN", "CREATININE", "GLUCOSE", "INR", "CALCIUM" in the last 72 hours.  Invalid input(s): "PT", "2"   Discharge Medications:   Allergies as of 06/02/2022       Reactions   Celestone [betamethasone] Nausea Only   Sweated; face and chest with redness for several days         Medication List     TAKE these medications    aspirin 81 MG chewable tablet Chew 1 tablet (81 mg total) by mouth 2 (two) times daily for 28 days.   cycloSPORINE 0.05 % ophthalmic emulsion Commonly known as: RESTASIS Place 1 drop into both eyes in the morning and at bedtime.   diclofenac Sodium 1 % Gel Commonly known as: VOLTAREN Apply 2 g topically 4 (four) times daily as needed (pain/swelling (leg/knee)).   fenofibrate 145 MG tablet Commonly known as: Tricor Take 1 tablet (145 mg total) by mouth daily.   HAIR SKIN & NAILS GUMMIES PO Take 2 each by mouth in the morning.   icosapent Ethyl 1 g capsule Commonly known as: VASCEPA TAKE 2 CAPSULES BY MOUTH TWICE  DAILY   losartan 50 MG tablet Commonly known as: COZAAR TAKE 1 TABLET BY MOUTH DAILY What changed: when to take this   Melatonin  10 MG Tabs Take 20 mg by mouth at bedtime.   meloxicam 7.5 MG tablet Commonly known as: MOBIC TAKE 1 TABLET BY MOUTH  DAILY   methocarbamol 500 MG tablet Commonly known as: ROBAXIN Take 1 tablet (500 mg total) by mouth every 6 (six) hours as needed for muscle spasms.   metroNIDAZOLE 0.75 % gel Commonly known as: METROGEL Apply 1 Application topically daily as needed (facial redness).   MULTI + OMEGA-3 ADULT GUMMIES PO Take 2 tablets by mouth in the morning.   OCUVITE PRESERVISION PO Take 1 tablet by mouth in the morning.   ONE TOUCH ULTRA TEST test strip Generic drug: glucose blood USE TO CHECK BLOOD SUGAR  DAILY    onetouch ultrasoft lancets USE AS INSTRUCTED TO CHECK  BLOOD SUGARS DAILY   oxyCODONE 5 MG immediate release tablet Commonly known as: Oxy IR/ROXICODONE Take 1 tablet (5 mg total) by mouth every 4 (four) hours as needed for severe pain.   Polyethyl Glycol-Propyl Glycol 0.4-0.3 % Soln Place 1 drop into both eyes 2 (two) times daily as needed (dry/irritated eyes).   polyethylene glycol 17 g packet Commonly known as: MIRALAX / GLYCOLAX Take 17 g by mouth 2 (two) times daily.   pravastatin 40 MG tablet Commonly known as: PRAVACHOL TAKE 1 TABLET BY MOUTH  DAILY   triamterene-hydrochlorothiazide 75-50 MG tablet Commonly known as: MAXZIDE TAKE ONE-HALF TABLET BY  MOUTH DAILY   Viactiv Calcium Plus D 650-12.5-40 MG-MCG-MCG Chew Generic drug: Calcium-Vitamin D-Vitamin K Chew 1 tablet by mouth in the morning and at bedtime.   vitamin C 250 MG tablet Commonly known as: ASCORBIC ACID Take 250 mg by mouth in the morning.       ASK your doctor about these medications    senna 8.6 MG Tabs tablet Commonly known as: SENOKOT Take 1 tablet (8.6 mg total) by mouth at bedtime for 14 days. Ask about: Should I take this medication?               Discharge Care Instructions  (From admission, onward)           Start     Ordered   06/02/22 0000  Change dressing       Comments: Maintain surgical dressing until follow up in the clinic. If the edges start to pull up, may reinforce with tape. If the dressing is no longer working, may remove and cover with gauze and tape, but must keep the area dry and clean.  Call with any questions or concerns.   06/02/22 0754            Diagnostic Studies: No results found.  Disposition: Discharge disposition: 01-Home or Self Care       Discharge Instructions     Call MD / Call 911   Complete by: As directed    If you experience chest pain or shortness of breath, CALL 911 and be transported to the hospital emergency room.  If you  develope a fever above 101 F, pus (white drainage) or increased drainage or redness at the wound, or calf pain, call your surgeon's office.   Change dressing   Complete by: As directed    Maintain surgical dressing until follow up in the clinic. If the edges start to pull up, may reinforce with tape. If the dressing is no longer working, may remove and cover with gauze and tape, but must keep the area dry and clean.  Call with any questions or concerns.  Constipation Prevention   Complete by: As directed    Drink plenty of fluids.  Prune juice may be helpful.  You may use a stool softener, such as Colace (over the counter) 100 mg twice a day.  Use MiraLax (over the counter) for constipation as needed.   Diet - low sodium heart healthy   Complete by: As directed    Increase activity slowly as tolerated   Complete by: As directed    Weight bearing as tolerated with assist device (walker, cane, etc) as directed, use it as long as suggested by your surgeon or therapist, typically at least 4-6 weeks.   Post-operative opioid taper instructions:   Complete by: As directed    POST-OPERATIVE OPIOID TAPER INSTRUCTIONS: It is important to wean off of your opioid medication as soon as possible. If you do not need pain medication after your surgery it is ok to stop day one. Opioids include: Codeine, Hydrocodone(Norco, Vicodin), Oxycodone(Percocet, oxycontin) and hydromorphone amongst others.  Long term and even short term use of opiods can cause: Increased pain response Dependence Constipation Depression Respiratory depression And more.  Withdrawal symptoms can include Flu like symptoms Nausea, vomiting And more Techniques to manage these symptoms Hydrate well Eat regular healthy meals Stay active Use relaxation techniques(deep breathing, meditating, yoga) Do Not substitute Alcohol to help with tapering If you have been on opioids for less than two weeks and do not have pain than it is ok to  stop all together.  Plan to wean off of opioids This plan should start within one week post op of your joint replacement. Maintain the same interval or time between taking each dose and first decrease the dose.  Cut the total daily intake of opioids by one tablet each day Next start to increase the time between doses. The last dose that should be eliminated is the evening dose.      TED hose   Complete by: As directed    Use stockings (TED hose) for 2 weeks on both leg(s).  You may remove them at night for sleeping.        Follow-up Information     Irving Copas, PA-C. Go on 06/16/2022.   Specialty: Orthopedic Surgery Why: You are scheduled for a follow up appointment on 06-16-22 at 11:00 am. Contact information: [address] [place] [address] [id number]                  Signed: Irving Copas 06/17/2022, 7:13 AM

## 2022-06-18 DIAGNOSIS — M25562 Pain in left knee: Secondary | ICD-10-CM | POA: Diagnosis not present

## 2022-06-21 DIAGNOSIS — M25562 Pain in left knee: Secondary | ICD-10-CM | POA: Diagnosis not present

## 2022-06-24 DIAGNOSIS — M25562 Pain in left knee: Secondary | ICD-10-CM | POA: Diagnosis not present

## 2022-06-28 DIAGNOSIS — M25562 Pain in left knee: Secondary | ICD-10-CM | POA: Diagnosis not present

## 2022-07-01 DIAGNOSIS — M25562 Pain in left knee: Secondary | ICD-10-CM | POA: Diagnosis not present

## 2022-07-06 DIAGNOSIS — M25562 Pain in left knee: Secondary | ICD-10-CM | POA: Diagnosis not present

## 2022-07-08 DIAGNOSIS — M25562 Pain in left knee: Secondary | ICD-10-CM | POA: Diagnosis not present

## 2022-07-13 DIAGNOSIS — M25562 Pain in left knee: Secondary | ICD-10-CM | POA: Diagnosis not present

## 2022-07-15 DIAGNOSIS — M25562 Pain in left knee: Secondary | ICD-10-CM | POA: Diagnosis not present

## 2022-07-19 DIAGNOSIS — Z4731 Aftercare following explantation of shoulder joint prosthesis: Secondary | ICD-10-CM | POA: Diagnosis not present

## 2022-07-20 DIAGNOSIS — M25562 Pain in left knee: Secondary | ICD-10-CM | POA: Diagnosis not present

## 2022-07-21 ENCOUNTER — Other Ambulatory Visit: Payer: Self-pay

## 2022-07-21 ENCOUNTER — Other Ambulatory Visit: Payer: Self-pay | Admitting: Internal Medicine

## 2022-07-22 DIAGNOSIS — M25562 Pain in left knee: Secondary | ICD-10-CM | POA: Diagnosis not present

## 2022-07-28 ENCOUNTER — Other Ambulatory Visit: Payer: Self-pay | Admitting: Internal Medicine

## 2022-07-29 DIAGNOSIS — M25562 Pain in left knee: Secondary | ICD-10-CM | POA: Diagnosis not present

## 2022-08-03 DIAGNOSIS — M25562 Pain in left knee: Secondary | ICD-10-CM | POA: Diagnosis not present

## 2022-08-13 DIAGNOSIS — E119 Type 2 diabetes mellitus without complications: Secondary | ICD-10-CM | POA: Diagnosis not present

## 2022-08-13 DIAGNOSIS — H0102B Squamous blepharitis left eye, upper and lower eyelids: Secondary | ICD-10-CM | POA: Diagnosis not present

## 2022-08-13 DIAGNOSIS — H2513 Age-related nuclear cataract, bilateral: Secondary | ICD-10-CM | POA: Diagnosis not present

## 2022-08-13 DIAGNOSIS — H0102A Squamous blepharitis right eye, upper and lower eyelids: Secondary | ICD-10-CM | POA: Diagnosis not present

## 2022-08-30 DIAGNOSIS — L820 Inflamed seborrheic keratosis: Secondary | ICD-10-CM | POA: Diagnosis not present

## 2022-08-30 DIAGNOSIS — L570 Actinic keratosis: Secondary | ICD-10-CM | POA: Diagnosis not present

## 2022-08-30 DIAGNOSIS — L821 Other seborrheic keratosis: Secondary | ICD-10-CM | POA: Diagnosis not present

## 2022-08-31 ENCOUNTER — Other Ambulatory Visit: Payer: Self-pay | Admitting: Internal Medicine

## 2022-09-28 ENCOUNTER — Other Ambulatory Visit: Payer: Self-pay | Admitting: Internal Medicine

## 2022-10-13 ENCOUNTER — Other Ambulatory Visit: Payer: Self-pay | Admitting: Internal Medicine

## 2022-10-18 ENCOUNTER — Encounter: Payer: Medicare Other | Admitting: Internal Medicine

## 2022-10-26 ENCOUNTER — Ambulatory Visit (INDEPENDENT_AMBULATORY_CARE_PROVIDER_SITE_OTHER): Payer: Medicare Other | Admitting: Internal Medicine

## 2022-10-26 ENCOUNTER — Encounter: Payer: Self-pay | Admitting: Internal Medicine

## 2022-10-26 VITALS — BP 130/70 | HR 80 | Temp 98.0°F | Ht 61.5 in | Wt 153.4 lb

## 2022-10-26 DIAGNOSIS — J069 Acute upper respiratory infection, unspecified: Secondary | ICD-10-CM | POA: Diagnosis not present

## 2022-10-26 DIAGNOSIS — N3000 Acute cystitis without hematuria: Secondary | ICD-10-CM | POA: Diagnosis not present

## 2022-10-26 DIAGNOSIS — I10 Essential (primary) hypertension: Secondary | ICD-10-CM | POA: Diagnosis not present

## 2022-10-26 DIAGNOSIS — R350 Frequency of micturition: Secondary | ICD-10-CM | POA: Insufficient documentation

## 2022-10-26 DIAGNOSIS — N3001 Acute cystitis with hematuria: Secondary | ICD-10-CM | POA: Insufficient documentation

## 2022-10-26 LAB — POC URINALSYSI DIPSTICK (AUTOMATED)
Bilirubin, UA: NEGATIVE
Blood, UA: NEGATIVE
Glucose, UA: NEGATIVE
Ketones, UA: NEGATIVE
Nitrite, UA: POSITIVE
Protein, UA: NEGATIVE
Spec Grav, UA: 1.025 (ref 1.010–1.025)
Urobilinogen, UA: 0.2 E.U./dL
pH, UA: 5.5 (ref 5.0–8.0)

## 2022-10-26 LAB — POC COVID19 BINAXNOW: SARS Coronavirus 2 Ag: NEGATIVE

## 2022-10-26 MED ORDER — AMOXICILLIN-POT CLAVULANATE 875-125 MG PO TABS: 1.0000 | ORAL_TABLET | Freq: Two times a day (BID) | ORAL | 0 refills | Status: AC

## 2022-10-26 NOTE — Assessment & Plan Note (Signed)
Acute Urine dip consistent with UTI Will send urine for culture Take the antibiotic as prescribed.  Augmentin bid x 7 days Take tylenol if needed.   Increase your water intake.  Call if no improvement

## 2022-10-26 NOTE — Patient Instructions (Addendum)
Your covid test is negative     Medications changes include :   Augmentin twice a day for one week     Return if symptoms worsen or fail to improve.    You do have a UTI.  Take the antibiotic.   Take tylenol if needed.   Increase your water intake.   Call if no improvement     Urinary Tract Infection, Adult A urinary tract infection (UTI) is an infection of any part of the urinary tract, which includes the kidneys, ureters, bladder, and urethra. These organs make, store, and get rid of urine in the body. UTI can be a bladder infection (cystitis) or kidney infection (pyelonephritis). What are the causes? This infection may be caused by fungi, viruses, or bacteria. Bacteria are the most common cause of UTIs. This condition can also be caused by repeated incomplete emptying of the bladder during urination. What increases the risk? This condition is more likely to develop if: You ignore your need to urinate or hold urine for long periods of time. You do not empty your bladder completely during urination. You wipe back to front after urinating or having a bowel movement, if you are female. You are uncircumcised, if you are female. You are constipated. You have a urinary catheter that stays in place (indwelling). You have a weak defense (immune) system. You have a medical condition that affects your bowels, kidneys, or bladder. You have diabetes. You take antibiotic medicines frequently or for long periods of time, and the antibiotics no longer work well against certain types of infections (antibiotic resistance). You take medicines that irritate your urinary tract. You are exposed to chemicals that irritate your urinary tract. You are female.  What are the signs or symptoms? Symptoms of this condition include: Fever. Frequent urination or passing small amounts of urine frequently. Needing to urinate urgently. Pain or burning with urination. Urine that smells bad or  unusual. Cloudy urine. Pain in the lower abdomen or back. Trouble urinating. Blood in the urine. Vomiting or being less hungry than normal. Diarrhea or abdominal pain. Vaginal discharge, if you are female.  How is this diagnosed? This condition is diagnosed with a medical history and physical exam. You will also need to provide a urine sample to test your urine. Other tests may be done, including: Blood tests. Sexually transmitted disease (STD) testing.  If you have had more than one UTI, a cystoscopy or imaging studies may be done to determine the cause of the infections. How is this treated? Treatment for this condition often includes a combination of two or more of the following: Antibiotic medicine. Other medicines to treat less common causes of UTI. Over-the-counter medicines to treat pain. Drinking enough water to stay hydrated.  Follow these instructions at home: Take over-the-counter and prescription medicines only as told by your health care provider. If you were prescribed an antibiotic, take it as told by your health care provider. Do not stop taking the antibiotic even if you start to feel better. Avoid alcohol, caffeine, tea, and carbonated beverages. They can irritate your bladder. Drink enough fluid to keep your urine clear or pale yellow. Keep all follow-up visits as told by your health care provider. This is important. Make sure to: Empty your bladder often and completely. Do not hold urine for long periods of time. Empty your bladder before and after sex. Wipe from front to back after a bowel movement if you are female. Use each tissue one  time when you wipe. Contact a health care provider if: You have back pain. You have a fever. You feel nauseous or vomit. Your symptoms do not get better after 3 days. Your symptoms go away and then return. Get help right away if: You have severe back pain or lower abdominal pain. You are vomiting and cannot keep down any  medicines or water. This information is not intended to replace advice given to you by your health care provider. Make sure you discuss any questions you have with your health care provider. Document Released: 12/23/2004 Document Revised: 08/27/2015 Document Reviewed: 02/03/2015 Elsevier Interactive Patient Education  [employer].

## 2022-10-26 NOTE — Assessment & Plan Note (Signed)
Chronic Blood pressure well controlled Continue triamterene-HCTZ 75-50 mg tablet-1/2 tablet daily, losartan 50 mg daily

## 2022-10-26 NOTE — Assessment & Plan Note (Signed)
Acute Symptoms likely viral in nature Covid test is negative Continue symptomatic treatment with over-the-counter cold medications, Tylenol/ibuprofen Increase rest and fluids Call if symptoms worsen or do not improve

## 2022-10-26 NOTE — Progress Notes (Signed)
Subjective:    Patient ID: Tiffany Hood, female    DOB: 1940-07-04, 82 y.o.   MRN: [id number]      HPI Tiffany Hood is here for  Chief Complaint  Patient presents with   Nasal Congestion    Symptoms started Friday (congestion, runny nose)    Her symptoms started 4 days ago and include appetite decreased, fatigue, nasal congestion, abdominal pain and diarrhea one day.  She has had a lot of increased urinary frequency.  She has headaches at times.     Medications and allergies reviewed with patient and updated if appropriate.  Current Outpatient Medications on File Prior to Visit  Medication Sig Dispense Refill   Biotin w/ Vitamins C & E (HAIR SKIN & NAILS GUMMIES PO) Take 2 each by mouth in the morning.     cycloSPORINE (RESTASIS) 0.05 % ophthalmic emulsion Place 1 drop into both eyes in the morning and at bedtime.     diclofenac Sodium (VOLTAREN) 1 % GEL Apply 2 g topically 4 (four) times daily as needed (pain/swelling (leg/knee)).     fenofibrate (TRICOR) 145 MG tablet TAKE 1 TABLET BY MOUTH DAILY 90 tablet 2   Lancets (ONETOUCH ULTRASOFT) lancets USE AS INSTRUCTED TO CHECK  BLOOD SUGARS DAILY 100 each 3   losartan (COZAAR) 50 MG tablet TAKE 1 TABLET BY MOUTH DAILY 60 tablet 5   Melatonin 10 MG TABS Take 20 mg by mouth at bedtime.     meloxicam (MOBIC) 7.5 MG tablet TAKE 1 TABLET BY MOUTH DAILY 100 tablet 1   metroNIDAZOLE (METROGEL) 0.75 % gel Apply 1 Application topically daily as needed (facial redness).     Multiple Vitamins-Minerals (OCUVITE PRESERVISION PO) Take 1 tablet by mouth in the morning.     ONE TOUCH ULTRA TEST test strip USE TO CHECK BLOOD SUGAR  DAILY 100 each 3   Polyethyl Glycol-Propyl Glycol 0.4-0.3 % SOLN Place 1 drop into both eyes 2 (two) times daily as needed (dry/irritated eyes).     pravastatin (PRAVACHOL) 40 MG tablet TAKE 1 TABLET BY MOUTH DAILY 100 tablet 2   triamterene-hydrochlorothiazide (MAXZIDE) 75-50 MG tablet TAKE ONE-HALF TABLET BY MOUTH   DAILY 50 tablet 2   vitamin C (ASCORBIC ACID) 250 MG tablet Take 250 mg by mouth in the morning.     No current facility-administered medications on file prior to visit.    Review of Systems  Constitutional:  Positive for appetite change (decrease) and fatigue. Negative for chills and fever.  HENT:  Positive for congestion (improved today). Negative for ear pain, postnasal drip, sinus pain and sore throat.        Metallic taste in mouth after she ate bread today, swollen nodule in left side of neck - better day  Respiratory:  Negative for cough, shortness of breath and wheezing.   Gastrointestinal:  Positive for abdominal pain (maybe a little in LUQ) and diarrhea (the first day only - resolved). Negative for blood in stool and nausea.       A couple of episodes of reflux  Genitourinary:  Positive for frequency. Negative for dysuria, hematuria and urgency.  Musculoskeletal:  Positive for myalgias.  Neurological:  Positive for headaches (mild, intermittent). Negative for dizziness and light-headedness.       Objective:   Vitals:   10/26/22 1302  Pulse: 80  Temp: 98 F (36.7 C)  SpO2: 94%   BP Readings from Last 3 Encounters:  06/02/22 (!) 141/62  05/19/22 (!) 148/74  04/19/22 130/78   Wt Readings from Last 3 Encounters:  10/26/22 153 lb 6.4 oz (69.6 kg)  06/01/22 153 lb (69.4 kg)  05/19/22 153 lb (69.4 kg)   Body mass index is 28.52 kg/m.    Physical Exam Constitutional:      General: She is not in acute distress.    Appearance: Normal appearance. She is not ill-appearing.  HENT:     Head: Normocephalic and atraumatic.     Right Ear: Tympanic membrane, ear canal and external ear normal.     Left Ear: Tympanic membrane, ear canal and external ear normal.     Mouth/Throat:     Mouth: Mucous membranes are moist.     Pharynx: No oropharyngeal exudate or posterior oropharyngeal erythema.  Eyes:     Conjunctiva/sclera: Conjunctivae normal.  Cardiovascular:     Rate and  Rhythm: Normal rate and regular rhythm.  Pulmonary:     Effort: Pulmonary effort is normal. No respiratory distress.     Breath sounds: Normal breath sounds. No wheezing or rales.  Abdominal:     General: There is no distension.     Palpations: Abdomen is soft.     Tenderness: There is abdominal tenderness (suprapubic region, LUQ). There is no right CVA tenderness, left CVA tenderness, guarding or rebound.  Musculoskeletal:     Cervical back: Neck supple. No tenderness.  Lymphadenopathy:     Cervical: No cervical adenopathy.  Skin:    General: Skin is warm and dry.  Neurological:     Mental Status: She is alert.            Assessment & Plan:    See Problem List for Assessment and Plan of chronic medical problems.

## 2022-11-04 ENCOUNTER — Other Ambulatory Visit: Payer: Self-pay | Admitting: Internal Medicine

## 2022-11-04 DIAGNOSIS — Z1231 Encounter for screening mammogram for malignant neoplasm of breast: Secondary | ICD-10-CM

## 2022-11-04 NOTE — Progress Notes (Signed)
All a lot of flulike   Subjective:    Patient ID: Tiffany Hood, female    DOB: 03-31-1940, 82 y.o.   MRN: [id number]      HPI Tiffany Hood is here for a Physical exam and her chronic medical problems.   Doing well overall.    Medications and allergies reviewed with patient and updated if appropriate.  Current Outpatient Medications on File Prior to Visit  Medication Sig Dispense Refill   Biotin w/ Vitamins C & E (HAIR SKIN & NAILS GUMMIES PO) Take 2 each by mouth in the morning.     cycloSPORINE (RESTASIS) 0.05 % ophthalmic emulsion Place 1 drop into both eyes in the morning and at bedtime.     diclofenac Sodium (VOLTAREN) 1 % GEL Apply 2 g topically 4 (four) times daily as needed (pain/swelling (leg/knee)).     fenofibrate (TRICOR) 145 MG tablet TAKE 1 TABLET BY MOUTH DAILY 90 tablet 2   Lancets (ONETOUCH ULTRASOFT) lancets USE AS INSTRUCTED TO CHECK  BLOOD SUGARS DAILY 100 each 3   losartan (COZAAR) 50 MG tablet TAKE 1 TABLET BY MOUTH DAILY 60 tablet 5   Melatonin 10 MG TABS Take 20 mg by mouth at bedtime.     metroNIDAZOLE (METROGEL) 0.75 % gel Apply 1 Application topically daily as needed (facial redness).     Multiple Vitamins-Minerals (OCUVITE PRESERVISION PO) Take 1 tablet by mouth in the morning.     ONE TOUCH ULTRA TEST test strip USE TO CHECK BLOOD SUGAR  DAILY 100 each 3   Polyethyl Glycol-Propyl Glycol 0.4-0.3 % SOLN Place 1 drop into both eyes 2 (two) times daily as needed (dry/irritated eyes).     pravastatin (PRAVACHOL) 40 MG tablet TAKE 1 TABLET BY MOUTH DAILY 100 tablet 2   triamterene-hydrochlorothiazide (MAXZIDE) 75-50 MG tablet TAKE ONE-HALF TABLET BY MOUTH  DAILY 50 tablet 2   vitamin C (ASCORBIC ACID) 250 MG tablet Take 250 mg by mouth in the morning.     No current facility-administered medications on file prior to visit.    Review of Systems  Constitutional:  Negative for fever.  Eyes:  Negative for visual disturbance.  Respiratory:  Negative for cough,  shortness of breath and wheezing.   Cardiovascular:  Negative for chest pain, palpitations and leg swelling.  Gastrointestinal:  Negative for abdominal pain, blood in stool, constipation and diarrhea.       No gerd  Genitourinary:  Positive for frequency. Negative for dysuria and hematuria.  Musculoskeletal:  Positive for arthralgias (hands) and back pain (recent - x 3-4 days).  Skin:  Negative for rash.  Neurological:  Negative for light-headedness and headaches.  Psychiatric/Behavioral:  Negative for dysphoric mood. The patient is not nervous/anxious.        Objective:   Vitals:   11/05/22 1106  BP: 128/70  Pulse: 75  Temp: 98.1 F (36.7 C)  SpO2: 98%   Filed Weights   11/05/22 1106  Weight: 153 lb (69.4 kg)   Body mass index is 28.44 kg/m.  BP Readings from Last 3 Encounters:  11/05/22 128/70  10/26/22 130/70  06/02/22 (!) 141/62    Wt Readings from Last 3 Encounters:  11/05/22 153 lb (69.4 kg)  10/26/22 153 lb 6.4 oz (69.6 kg)  06/01/22 153 lb (69.4 kg)       Physical Exam Constitutional: She appears well-developed and well-nourished. No distress.  HENT:  Head: Normocephalic and atraumatic.  Right Ear: External ear normal. Normal ear canal and TM Left  Ear: External ear normal.  Normal ear canal and TM Mouth/Throat: Oropharynx is clear and moist.  Eyes: Conjunctivae normal.  Neck: Neck supple. No tracheal deviation present. No thyromegaly present.  No carotid bruit  Cardiovascular: Normal rate, regular rhythm and normal heart sounds.   No murmur heard.  No edema. Pulmonary/Chest: Effort normal and breath sounds normal. No respiratory distress. She has no wheezes. She has no rales.  Breast: deferred   Abdominal: Soft. She exhibits no distension. There is no tenderness.  Lymphadenopathy: She has no cervical adenopathy.  Skin: Skin is warm and dry. She is not diaphoretic.  Psychiatric: She has a normal mood and affect. Her behavior is normal.    Diabetic  Foot Exam - Simple   Simple Foot Form Diabetic Foot exam was performed with the following findings: Yes 11/05/2022 11:28 AM  Visual Inspection No deformities, no ulcerations, no other skin breakdown bilaterally: Yes Sensation Testing Intact to touch and monofilament testing bilaterally: Yes Pulse Check Posterior Tibialis and Dorsalis pulse intact bilaterally: Yes Comments      Lab Results  Component Value Date   WBC 10.1 06/02/2022   HGB 11.5 (L) 06/02/2022   HCT 35.7 (L) 06/02/2022   PLT 248 06/02/2022   GLUCOSE 120 (H) 06/02/2022   CHOL 231 (H) 04/19/2022   TRIG (H) 04/19/2022    442.0 Triglyceride is over 400; calculations on Lipids are invalid.   HDL 64.90 04/19/2022   LDLDIRECT 99.0 04/19/2022   LDLCALC  10/09/2019     Comment:     . LDL cholesterol not calculated. Triglyceride levels greater than 400 mg/dL invalidate calculated LDL results. . Reference range: <100 . Desirable range <100 mg/dL for primary prevention;   <70 mg/dL for patients with CHD or diabetic patients  with > or = 2 CHD risk factors. Marland Kitchen LDL-C is now calculated using the Martin-Hopkins  calculation, which is a validated novel method providing  better accuracy than the Friedewald equation in the  estimation of LDL-C.  Horald Pollen et al. Lenox Ahr. 9147;829(56): 2061-2068  (http://education.[facility].com/faq/FAQ164)    ALT 18 04/19/2022   AST 19 04/19/2022   NA 136 06/02/2022   K 4.0 06/02/2022   CL 106 06/02/2022   CREATININE 0.77 06/02/2022   BUN 27 (H) 06/02/2022   CO2 23 06/02/2022   TSH 1.32 04/10/2021   INR 0.92 02/18/2015   HGBA1C 6.5 04/19/2022   MICROALBUR <0.7 10/16/2021         Assessment & Plan:   Physical exam: Screening blood work  ordered Exercise  not enough - will work on increasing exercise Weight   good for age Substance abuse  none   Reviewed recommended immunizations.   Health Maintenance  Topic Date Due   Medicare Annual Wellness (AWV)  07/21/2021    COVID-19 Vaccine (7 - 2023-24 season) 06/15/2022   Diabetic kidney evaluation - Urine ACR  10/17/2022   HEMOGLOBIN A1C  10/18/2022   INFLUENZA VACCINE  06/27/2023 (Originally 10/28/2022)   OPHTHALMOLOGY EXAM  12/19/2022   DTaP/Tdap/Td (2 - Tdap) 03/29/2023   Diabetic kidney evaluation - eGFR measurement  06/02/2023   FOOT EXAM  11/05/2023   DEXA SCAN  05/10/2025   Pneumonia Vaccine 94+ Years old  Completed   Zoster Vaccines- Shingrix  Completed   HPV VACCINES  Aged Out   Hepatitis C Screening  Discontinued          See Problem List for Assessment and Plan of chronic medical problems.

## 2022-11-04 NOTE — Patient Instructions (Addendum)
Blood work was ordered.   The lab is on the first floor.    Medications changes include :   none    Return in about 6 months (around 05/08/2023) for follow up.   Health Maintenance, Female Adopting a healthy lifestyle and getting preventive care are important in promoting health and wellness. Ask your health care provider about: The right schedule for you to have regular tests and exams. Things you can do on your own to prevent diseases and keep yourself healthy. What should I know about diet, weight, and exercise? Eat a healthy diet  Eat a diet that includes plenty of vegetables, fruits, low-fat dairy products, and lean protein. Do not eat a lot of foods that are high in solid fats, added sugars, or sodium. Maintain a healthy weight Body mass index (BMI) is used to identify weight problems. It estimates body fat based on height and weight. Your health care provider can help determine your BMI and help you achieve or maintain a healthy weight. Get regular exercise Get regular exercise. This is one of the most important things you can do for your health. Most adults should: Exercise for at least 150 minutes each week. The exercise should increase your heart rate and make you sweat (moderate-intensity exercise). Do strengthening exercises at least twice a week. This is in addition to the moderate-intensity exercise. Spend less time sitting. Even light physical activity can be beneficial. Watch cholesterol and blood lipids Have your blood tested for lipids and cholesterol at 81 years of age, then have this test every 5 years. Have your cholesterol levels checked more often if: Your lipid or cholesterol levels are high. You are older than 81 years of age. You are at high risk for heart disease. What should I know about cancer screening? Depending on your health history and family history, you may need to have cancer screening at various ages. This may include screening  for: Breast cancer. Cervical cancer. Colorectal cancer. Skin cancer. Lung cancer. What should I know about heart disease, diabetes, and high blood pressure? Blood pressure and heart disease High blood pressure causes heart disease and increases the risk of stroke. This is more likely to develop in people who have high blood pressure readings or are overweight. Have your blood pressure checked: Every 3-5 years if you are 80-40 years of age. Every year if you are 40 years old or older. Diabetes Have regular diabetes screenings. This checks your fasting blood sugar level. Have the screening done: Once every three years after age 62 if you are at a normal weight and have a low risk for diabetes. More often and at a younger age if you are overweight or have a high risk for diabetes. What should I know about preventing infection? Hepatitis B If you have a higher risk for hepatitis B, you should be screened for this virus. Talk with your health care provider to find out if you are at risk for hepatitis B infection. Hepatitis C Testing is recommended for: Everyone born from [birth year] through 1964. Anyone with known risk factors for hepatitis C. Sexually transmitted infections (STIs) Get screened for STIs, including gonorrhea and chlamydia, if: You are sexually active and are younger than 81 years of age. You are older than 81 years of age and your health care provider tells you that you are at risk for this type of infection. Your sexual activity has changed since you were last screened, and you are at increased  risk for chlamydia or gonorrhea. Ask your health care provider if you are at risk. Ask your health care provider about whether you are at high risk for HIV. Your health care provider may recommend a prescription medicine to help prevent HIV infection. If you choose to take medicine to prevent HIV, you should first get tested for HIV. You should then be tested every 3 months for as long as you  are taking the medicine. Pregnancy If you are about to stop having your period (premenopausal) and you may become pregnant, seek counseling before you get pregnant. Take 400 to 800 micrograms (mcg) of folic acid every day if you become pregnant. Ask for birth control (contraception) if you want to prevent pregnancy. Osteoporosis and menopause Osteoporosis is a disease in which the bones lose minerals and strength with aging. This can result in bone fractures. If you are 41 years old or older, or if you are at risk for osteoporosis and fractures, ask your health care provider if you should: Be screened for bone loss. Take a calcium or vitamin D supplement to lower your risk of fractures. Be given hormone replacement therapy (HRT) to treat symptoms of menopause. Follow these instructions at home: Alcohol use Do not drink alcohol if: Your health care provider tells you not to drink. You are pregnant, may be pregnant, or are planning to become pregnant. If you drink alcohol: Limit how much you have to: 0-1 drink a day. Know how much alcohol is in your drink. In the [place] one drink equals one 12 oz bottle of beer (355 mL), one 5 oz glass of wine (148 mL), or one 1 oz glass of hard liquor (44 mL). Lifestyle Do not use any products that contain nicotine or tobacco. These products include cigarettes, chewing tobacco, and vaping devices, such as e-cigarettes. If you need help quitting, ask your health care provider. Do not use street drugs. Do not share needles. Ask your health care provider for help if you need support or information about quitting drugs. General instructions Schedule regular health, dental, and eye exams. Stay current with your vaccines. Tell your health care provider if: You often feel depressed. You have ever been abused or do not feel safe at home. Summary Adopting a healthy lifestyle and getting preventive care are important in promoting health and wellness. Follow your  health care provider's instructions about healthy diet, exercising, and getting tested or screened for diseases. Follow your health care provider's instructions on monitoring your cholesterol and blood pressure. This information is not intended to replace advice given to you by your health care provider. Make sure you discuss any questions you have with your health care provider. Document Revised: 08/04/2020 Document Reviewed: 08/04/2020 Elsevier Patient Education  2024 [employer].

## 2022-11-05 ENCOUNTER — Encounter: Payer: Self-pay | Admitting: Internal Medicine

## 2022-11-05 ENCOUNTER — Ambulatory Visit: Payer: Medicare Other | Admitting: Internal Medicine

## 2022-11-05 VITALS — BP 128/70 | HR 75 | Temp 98.1°F | Ht 61.5 in | Wt 153.0 lb

## 2022-11-05 DIAGNOSIS — E119 Type 2 diabetes mellitus without complications: Secondary | ICD-10-CM

## 2022-11-05 DIAGNOSIS — M85839 Other specified disorders of bone density and structure, unspecified forearm: Secondary | ICD-10-CM

## 2022-11-05 DIAGNOSIS — E7849 Other hyperlipidemia: Secondary | ICD-10-CM

## 2022-11-05 DIAGNOSIS — R350 Frequency of micturition: Secondary | ICD-10-CM

## 2022-11-05 DIAGNOSIS — Z0000 Encounter for general adult medical examination without abnormal findings: Secondary | ICD-10-CM

## 2022-11-05 DIAGNOSIS — E042 Nontoxic multinodular goiter: Secondary | ICD-10-CM

## 2022-11-05 DIAGNOSIS — I10 Essential (primary) hypertension: Secondary | ICD-10-CM | POA: Diagnosis not present

## 2022-11-05 LAB — MICROALBUMIN / CREATININE URINE RATIO
Creatinine,U: 110.5 mg/dL
Microalb Creat Ratio: 0.6 mg/g (ref 0.0–30.0)
Microalb, Ur: <0.7 mg/dL (ref 0.0–1.9)

## 2022-11-05 LAB — HEMOGLOBIN A1C: Hgb A1c MFr Bld: 6.9 % — ABNORMAL HIGH (ref 4.6–6.5)

## 2022-11-05 LAB — CBC WITH DIFFERENTIAL/PLATELET
Basophils Absolute: 0.1 10*3/uL (ref 0.0–0.1)
Basophils Relative: 0.8 % (ref 0.0–3.0)
Eosinophils Absolute: 0.3 10*3/uL (ref 0.0–0.7)
Eosinophils Relative: 3.5 % (ref 0.0–5.0)
HCT: 40.9 % (ref 36.0–46.0)
Hemoglobin: 13.3 g/dL (ref 12.0–15.0)
Lymphocytes Relative: 21.5 % (ref 12.0–46.0)
Lymphs Abs: 1.9 10*3/uL (ref 0.7–4.0)
MCHC: 32.5 g/dL (ref 30.0–36.0)
MCV: 87 fl (ref 78.0–100.0)
Monocytes Absolute: 0.7 10*3/uL (ref 0.1–1.0)
Monocytes Relative: 7.8 % (ref 3.0–12.0)
Neutro Abs: 5.8 10*3/uL (ref 1.4–7.7)
Neutrophils Relative %: 66.4 % (ref 43.0–77.0)
Platelets: 248 10*3/uL (ref 150.0–400.0)
RBC: 4.7 Mil/uL (ref 3.87–5.11)
RDW: 14.8 % (ref 11.5–15.5)
WBC: 8.7 10*3/uL (ref 4.0–10.5)

## 2022-11-05 LAB — COMPREHENSIVE METABOLIC PANEL
ALT: 16 U/L (ref 0–35)
AST: 19 U/L (ref 0–37)
Albumin: 4.5 g/dL (ref 3.5–5.2)
Alkaline Phosphatase: 55 U/L (ref 39–117)
BUN: 24 mg/dL — ABNORMAL HIGH (ref 6–23)
CO2: 29 mEq/L (ref 19–32)
Calcium: 10.5 mg/dL (ref 8.4–10.5)
Chloride: 99 mEq/L (ref 96–112)
Creatinine, Ser: 0.77 mg/dL (ref 0.40–1.20)
GFR: 72.73 mL/min (ref >60.00)
Glucose, Bld: 83 mg/dL (ref 70–99)
Potassium: 4.2 mEq/L (ref 3.5–5.1)
Sodium: 136 mEq/L (ref 135–145)
Total Bilirubin: 0.3 mg/dL (ref 0.2–1.2)
Total Protein: 7.4 g/dL (ref 6.0–8.3)

## 2022-11-05 LAB — LIPID PANEL
Cholesterol: 183 mg/dL (ref 0–200)
HDL: 62.3 mg/dL (ref >39.00)
NonHDL: 120.23
Total CHOL/HDL Ratio: 3
Triglycerides: 233 mg/dL — ABNORMAL HIGH (ref 0.0–149.0)
VLDL: 46.6 mg/dL — ABNORMAL HIGH (ref 0.0–40.0)

## 2022-11-05 LAB — URINALYSIS, ROUTINE W REFLEX MICROSCOPIC
Bilirubin Urine: NEGATIVE
Hgb urine dipstick: NEGATIVE
Ketones, ur: NEGATIVE
Leukocytes,Ua: NEGATIVE
Nitrite: NEGATIVE
RBC / HPF: NONE SEEN (ref 0–?)
Specific Gravity, Urine: 1.02 (ref 1.000–1.030)
Total Protein, Urine: NEGATIVE
Urine Glucose: NEGATIVE
Urobilinogen, UA: 0.2 (ref 0.0–1.0)
pH: 6 (ref 5.0–8.0)

## 2022-11-05 LAB — TSH: TSH: 1.27 u[IU]/mL (ref 0.35–5.50)

## 2022-11-05 LAB — LDL CHOLESTEROL, DIRECT: Direct LDL: 90 mg/dL

## 2022-11-05 MED ORDER — MELOXICAM 7.5 MG PO TABS: 7.5000 mg | ORAL_TABLET | Freq: Every day | ORAL | Status: DC | PRN

## 2022-11-05 NOTE — Assessment & Plan Note (Signed)
Chronic DEXA up-to-date Encourage as much exercise as possible Continue calcium and vitamin D

## 2022-11-05 NOTE — Assessment & Plan Note (Signed)
Chronic Nodules stable - no follow up needed tsh

## 2022-11-05 NOTE — Assessment & Plan Note (Signed)
Chronic Regular exercise and healthy diet encouraged Check lipid panel  Continue pravastatin 40 mg daily 

## 2022-11-05 NOTE — Assessment & Plan Note (Signed)
Chronic   Lab Results  Component Value Date   HGBA1C 6.5 04/19/2022   Sugars well controlled Check A1c, urine microalbumin Continue Lifestyle control Stressed regular exercise, diabetic diet

## 2022-11-05 NOTE — Assessment & Plan Note (Signed)
Recent UTI - treated Still has urinary frequency Check UA, Ucx

## 2022-11-05 NOTE — Assessment & Plan Note (Signed)
Chronic Blood pressure well controlled Cbc, cmp Continue triamterene-HCTZ 75-50 mg tablet-1/2 tablet daily, losartan 50 mg daily

## 2022-11-09 MED ORDER — SULFAMETHOXAZOLE-TRIMETHOPRIM 800-160 MG PO TABS: 1.0000 | ORAL_TABLET | Freq: Two times a day (BID) | ORAL | 0 refills | Status: AC

## 2022-11-09 NOTE — Addendum Note (Signed)
Addended by: [name] on: 11/09/2022 07:50 AM   Modules accepted: Orders

## 2022-11-17 ENCOUNTER — Ambulatory Visit (INDEPENDENT_AMBULATORY_CARE_PROVIDER_SITE_OTHER): Payer: Medicare Other | Admitting: Family Medicine

## 2022-11-17 ENCOUNTER — Encounter: Payer: Self-pay | Admitting: Family Medicine

## 2022-11-17 VITALS — BP 127/64 | HR 92 | Temp 97.8°F | Ht 61.5 in | Wt 150.0 lb

## 2022-11-17 DIAGNOSIS — R7989 Other specified abnormal findings of blood chemistry: Secondary | ICD-10-CM | POA: Diagnosis not present

## 2022-11-17 DIAGNOSIS — R630 Anorexia: Secondary | ICD-10-CM

## 2022-11-17 DIAGNOSIS — R5383 Other fatigue: Secondary | ICD-10-CM | POA: Diagnosis not present

## 2022-11-17 DIAGNOSIS — J3489 Other specified disorders of nose and nasal sinuses: Secondary | ICD-10-CM | POA: Diagnosis not present

## 2022-11-17 NOTE — Progress Notes (Signed)
Acute Office Visit  Subjective:     Patient ID: Tiffany Hood, female    DOB: April 08, 1940, 82 y.o.   MRN: [id number]  Chief Complaint  Patient presents with   Fatigue    HPI Patient is in today for fatigue.  Discussed the use of AI scribe software for clinical note transcription with the patient, who gave verbal consent to proceed.  History of Present Illness   The patient, an active 82 year old presents with a several-week history of progressive fatigue and malaise. They report feeling unwell since the last week of July, with symptoms initially attributed to a UTI. Despite two rounds of antibiotics, the patient's condition has not improved and they describe feeling like they are 'laying around' most days, which is atypical for their usually active lifestyle.  The patient also reports a significant decrease in appetite, having to force themselves to eat, and consuming only small amounts such as a cup of yogurt and a piece of bread. They also acknowledge inadequate fluid intake.  The patient denies abdominal pain and reports normal daily bowel movements, albeit less frequent than their usual pattern. They have no difficulty passing stool and there is no visible blood. Urine color varies from dark to light.  The patient has tested negative for COVID-19 twice, once at a healthcare facility and once using a home test kit yesterday. Despite the negative results, they have concerns about potential COVID-19 infection due to their ongoing malaise. They also report recent onset of nasal congestion and runny nose.  The patient has noticed a sensation of intermittent discomfort in the balls of their feet, which they describe as similar to neuropathy, although they have no prior history of this condition.  The patient's symptoms have significantly impacted their quality of life, leading them to cancel plans with visitors due to their lack of energy and overall malaise.           ROS All  review of systems negative except what is listed in the HPI      Objective:    BP 127/64   Pulse 92   Temp 97.8 F (36.6 C) (Oral)   Ht 5' 1.5" (1.562 m)   Wt 150 lb (68 kg)   SpO2 100%   BMI 27.88 kg/m    Physical Exam Vitals reviewed.  Constitutional:      General: She is not in acute distress.    Appearance: Normal appearance. She is not ill-appearing.  HENT:     Nose: Rhinorrhea present.  Cardiovascular:     Rate and Rhythm: Normal rate and regular rhythm.     Heart sounds: Normal heart sounds.  Pulmonary:     Effort: Pulmonary effort is normal.     Breath sounds: Normal breath sounds. No wheezing, rhonchi or rales.  Musculoskeletal:     Cervical back: Normal range of motion and neck supple.  Skin:    General: Skin is warm and dry.  Neurological:     Mental Status: She is alert and oriented to person, place, and time.  Psychiatric:        Mood and Affect: Mood normal.        Behavior: Behavior normal.        Thought Content: Thought content normal.        Judgment: Judgment normal.        No results found for any visits on 11/17/22.      Assessment & Plan:   Problem List Items Addressed This Visit  None Visit Diagnoses     Fatigue, unspecified type    -  Primary Decreased appetite Patient reports feeling unwell since the last week of July, with decreased energy and appetite. Recent history of UTI treated with two rounds of antibiotics. No fever, abdominal pain, or changes in bowel habits. Negative COVID-19 tests. -Order comprehensive metabolic panel, complete blood count, and vitamin B12 level to evaluate for potential causes of fatigue and decreased appetite. -Check urine culture to ensure resolution of UTI. -Encourage patient to maintain hydration and stay active.    Relevant Orders   Urinalysis w microscopic + reflex cultur   B12 and Folate Panel   CBC with Differential/Platelet   Comprehensive metabolic panel   IBC + Ferritin           Relevant Orders   Urinalysis w microscopic + reflex cultur   B12 and Folate Panel   CBC with Differential/Platelet   Comprehensive metabolic panel   Rhinorrhea    Patient reports recent onset of nasal congestion and rhinorrhea. -Recommend Flonase or Nasacort for symptom relief.         No orders of the defined types were placed in this encounter.   Return if symptoms worsen or fail to improve.  Clayborne Dana, NP

## 2022-11-18 LAB — COMPREHENSIVE METABOLIC PANEL
ALT: 197 U/L — ABNORMAL HIGH (ref 0–35)
AST: 231 U/L — ABNORMAL HIGH (ref 0–37)
Albumin: 4.2 g/dL (ref 3.5–5.2)
Alkaline Phosphatase: 159 U/L — ABNORMAL HIGH (ref 39–117)
BUN: 24 mg/dL — ABNORMAL HIGH (ref 6–23)
CO2: 24 meq/L (ref 19–32)
Calcium: 10.3 mg/dL (ref 8.4–10.5)
Chloride: 96 mEq/L (ref 96–112)
Creatinine, Ser: 0.9 mg/dL (ref 0.40–1.20)
GFR: 60.3 mL/min (ref >60.00)
Glucose, Bld: 106 mg/dL — ABNORMAL HIGH (ref 70–99)
Potassium: 4 meq/L (ref 3.5–5.1)
Sodium: 132 mEq/L — ABNORMAL LOW (ref 135–145)
Total Bilirubin: 0.6 mg/dL (ref 0.2–1.2)
Total Protein: 7.3 g/dL (ref 6.0–8.3)

## 2022-11-18 LAB — CBC WITH DIFFERENTIAL/PLATELET
Basophils Absolute: 0.1 10*3/uL (ref 0.0–0.1)
Basophils Relative: 0.6 % (ref 0.0–3.0)
Eosinophils Absolute: 0.4 10*3/uL (ref 0.0–0.7)
Eosinophils Relative: 4 % (ref 0.0–5.0)
HCT: 40 % (ref 36.0–46.0)
Hemoglobin: 13.1 g/dL (ref 12.0–15.0)
Lymphocytes Relative: 15.6 % (ref 12.0–46.0)
Lymphs Abs: 1.6 10*3/uL (ref 0.7–4.0)
MCHC: 32.8 g/dL (ref 30.0–36.0)
MCV: 86.2 fl (ref 78.0–100.0)
Monocytes Absolute: 0.9 10*3/uL (ref 0.1–1.0)
Monocytes Relative: 8.7 % (ref 3.0–12.0)
Neutro Abs: 7.2 10*3/uL (ref 1.4–7.7)
Neutrophils Relative %: 71.1 % (ref 43.0–77.0)
Platelets: 280 10*3/uL (ref 150.0–400.0)
RBC: 4.64 Mil/uL (ref 3.87–5.11)
RDW: 14.6 % (ref 11.5–15.5)
WBC: 10.1 10*3/uL (ref 4.0–10.5)

## 2022-11-19 ENCOUNTER — Encounter: Payer: Self-pay | Admitting: Internal Medicine

## 2022-11-19 LAB — URINALYSIS W MICROSCOPIC + REFLEX CULTURE
Bacteria, UA: NONE SEEN /HPF
Bilirubin Urine: NEGATIVE
Glucose, UA: NEGATIVE
Hgb urine dipstick: NEGATIVE
Hyaline Cast: NONE SEEN /LPF
Ketones, ur: NEGATIVE
Nitrites, Initial: NEGATIVE
Protein, ur: NEGATIVE
Specific Gravity, Urine: 1.022 (ref 1.001–1.035)
pH: 5.5 (ref 5.0–8.0)

## 2022-11-19 LAB — B12 AND FOLATE PANEL
Folate: >24.2 ng/mL (ref >5.9)
Vitamin B-12: 406 pg/mL (ref 211–911)

## 2022-11-19 LAB — URINE CULTURE
MICRO NUMBER:: 15367403
Result:: NO GROWTH
SPECIMEN QUALITY:: ADEQUATE

## 2022-11-19 LAB — IBC + FERRITIN
Ferritin: 207.5 ng/mL (ref 10.0–291.0)
Iron: 35 ug/dL — ABNORMAL LOW (ref 42–145)
Saturation Ratios: 8.8 % — ABNORMAL LOW (ref 20.0–50.0)
TIBC: 396.2 ug/dL (ref 250.0–450.0)
Transferrin: 283 mg/dL (ref 212.0–360.0)

## 2022-11-19 LAB — CULTURE INDICATED

## 2022-11-19 NOTE — Addendum Note (Signed)
Addended by: Clayborne Dana on: 11/19/2022 01:31 PM   Modules accepted: Orders

## 2022-11-20 ENCOUNTER — Ambulatory Visit (HOSPITAL_BASED_OUTPATIENT_CLINIC_OR_DEPARTMENT_OTHER)
Admission: RE | Admit: 2022-11-20 | Discharge: 2022-11-20 | Disposition: A | Discharge status: Home / Self Care | Payer: Medicare Other | Source: Ambulatory Visit | Attending: Family Medicine | Admitting: Family Medicine

## 2022-11-20 DIAGNOSIS — R630 Anorexia: Secondary | ICD-10-CM | POA: Diagnosis present

## 2022-11-20 DIAGNOSIS — R7989 Other specified abnormal findings of blood chemistry: Secondary | ICD-10-CM | POA: Diagnosis not present

## 2022-11-20 DIAGNOSIS — R5381 Other malaise: Secondary | ICD-10-CM | POA: Diagnosis not present

## 2022-11-20 DIAGNOSIS — R5383 Other fatigue: Secondary | ICD-10-CM | POA: Diagnosis not present

## 2022-11-25 NOTE — Progress Notes (Signed)
Subjective:    Patient ID: Tiffany Hood, female    DOB: [birth year], 82 y.o.   MRN: [id number]      HPI Tiffany Hood is here for  Chief Complaint  Patient presents with   Referral     8/21 seen for fatigue x several weeks - ? Related to URI - 2 rounds of abx and not improved. Had dec appetite, urine color varied, discomfort in feet - neuropathy like pain.  Labs done -- elevated lfts, iron slightly low, Na 132.  ucx neg, cbc, B12 normal.   Abd US done - pending report   Balls of feet no longer hurt  Now has left hand pain and joint swelling near the thumb with pain radiating up to left shoulder.  This started a few days ago.  The pain radiated up towards her shoulder is still there, but improved.  She did have right hand pain the other day w/o radiation up the arm- right hand pain has resolved.  Yesterday - left groin area began hurting- cramping pain - hard to get in and out of bed and up and down to bathroom.  She is walking with a cane now because she is concerned about falling.  The pain is severe at times.  Severe HA - usually does not get headaches.   Rash - little red bumps b/l from knees down - blotches  - just over one week ago - lasted a couple of days ago  Got stung by something - late July on right lower arm - had red circle the size of an egg. It was painful and itchy --- then just itchy.  Took forever to go away - about three weeks.  She did not see what stung her, but assumed it must have been a bee or something similar  Drinking lot of fluids  Her father had RA  Medications and allergies reviewed with patient and updated if appropriate.  Current Outpatient Medications on File Prior to Visit  Medication Sig Dispense Refill   Biotin w/ Vitamins C & E (HAIR SKIN & NAILS GUMMIES PO) Take 2 each by mouth in the morning.     cycloSPORINE (RESTASIS) 0.05 % ophthalmic emulsion Place 1 drop into both eyes in the morning and at bedtime.     diclofenac Sodium  (VOLTAREN) 1 % GEL Apply 2 g topically 4 (four) times daily as needed (pain/swelling (leg/knee)).     fenofibrate (TRICOR) 145 MG tablet TAKE 1 TABLET BY MOUTH DAILY 90 tablet 2   Lancets (ONETOUCH ULTRASOFT) lancets USE AS INSTRUCTED TO CHECK  BLOOD SUGARS DAILY 100 each 3   losartan (COZAAR) 50 MG tablet TAKE 1 TABLET BY MOUTH DAILY 60 tablet 5   Melatonin 10 MG TABS Take 20 mg by mouth at bedtime.     meloxicam (MOBIC) 7.5 MG tablet Take 1 tablet (7.5 mg total) by mouth daily as needed for pain.     metroNIDAZOLE (METROGEL) 0.75 % gel Apply 1 Application topically daily as needed (facial redness).     Multiple Vitamins-Minerals (OCUVITE PRESERVISION PO) Take 1 tablet by mouth in the morning.     ONE TOUCH ULTRA TEST test strip USE TO CHECK BLOOD SUGAR  DAILY 100 each 3   Polyethyl Glycol-Propyl Glycol 0.4-0.3 % SOLN Place 1 drop into both eyes 2 (two) times daily as needed (dry/irritated eyes).     pravastatin (PRAVACHOL) 40 MG tablet TAKE 1 TABLET BY MOUTH DAILY 100 tablet 2   triamterene-hydrochlorothiazide (  MAXZIDE) 75-50 MG tablet TAKE ONE-HALF TABLET BY MOUTH  DAILY 50 tablet 2   vitamin C (ASCORBIC ACID) 250 MG tablet Take 250 mg by mouth in the morning.     No current facility-administered medications on file prior to visit.    Review of Systems  Constitutional:  Positive for appetite change (dec), chills, diaphoresis and fatigue. Negative for fever.  HENT:  Positive for congestion (mild) and rhinorrhea (better). Negative for ear pain and sore throat.   Respiratory:  Positive for cough (mild) and shortness of breath (maybe a little). Negative for wheezing.   Cardiovascular:  Negative for chest pain, palpitations and leg swelling.  Gastrointestinal:  Positive for nausea (having dry heaves). Negative for abdominal pain, blood in stool, constipation, diarrhea and vomiting.       No gerd  Genitourinary:  Negative for dysuria, frequency and hematuria.       Urine is clear   Musculoskeletal:  Positive for arthralgias, back pain (transient - resolved - pressure - improved with BM) and neck pain (posterior neck - new).  Skin:  Positive for rash.  Neurological:  Positive for headaches. Negative for dizziness and light-headedness.  Psychiatric/Behavioral:  Positive for sleep disturbance.        Objective:   Vitals:   11/26/22 0838  BP: 120/74  Pulse: 94  Temp: 97.7 F (36.5 C)  SpO2: 95%   BP Readings from Last 3 Encounters:  11/26/22 120/74  11/17/22 127/64  11/05/22 128/70   Wt Readings from Last 3 Encounters:  11/26/22 150 lb (68 kg)  11/17/22 150 lb (68 kg)  11/05/22 153 lb (69.4 kg)   Body mass index is 27.88 kg/m.    Physical Exam Constitutional:      General: She is not in acute distress.    Appearance: Normal appearance.  HENT:     Head: Normocephalic and atraumatic.     Mouth/Throat:     Mouth: Mucous membranes are moist.     Pharynx: No posterior oropharyngeal erythema.  Eyes:     Conjunctiva/sclera: Conjunctivae normal.  Cardiovascular:     Rate and Rhythm: Normal rate and regular rhythm.     Heart sounds: Normal heart sounds.  Pulmonary:     Effort: Pulmonary effort is normal. No respiratory distress.     Breath sounds: Normal breath sounds. No wheezing.  Abdominal:     General: There is no distension.     Palpations: Abdomen is soft.     Tenderness: There is abdominal tenderness (Mild tenderness left lower quadrant 1 focal area.  No other tenderness throughout abdomen). There is no guarding or rebound.  Musculoskeletal:        General: Swelling (Mild swelling left first and second MCP, PIP joints) and tenderness (Left MCP joint, left groin) present.     Cervical back: Neck supple.     Right lower leg: No edema.     Left lower leg: No edema.  Lymphadenopathy:     Cervical: No cervical adenopathy.  Skin:    General: Skin is warm and dry.     Findings: No erythema or rash.  Neurological:     Mental Status: She is  alert. Mental status is at baseline.  Psychiatric:        Mood and Affect: Mood normal.        Behavior: Behavior normal.             Latest Ref Rng & Units 11/17/2022    2:02 PM 11/05/2022  11:35 AM 04/19/2022   12:18 PM  Hepatic Function  Total Protein 6.0 - 8.3 g/dL 7.3  7.4  7.7   Albumin 3.5 - 5.2 g/dL 4.2  4.5  4.6   AST 0 - 37 U/L 231  19  19   ALT 0 - 35 U/L 197  16  18   Alk Phosphatase 39 - 117 U/L 159  55  61   Total Bilirubin 0.2 - 1.2 mg/dL 0.6  0.3  0.4      Assessment & Plan:    See Problem List for Assessment and Plan of chronic medical problems.

## 2022-11-26 ENCOUNTER — Encounter: Payer: Self-pay | Admitting: Internal Medicine

## 2022-11-26 ENCOUNTER — Ambulatory Visit: Payer: Medicare Other | Admitting: Internal Medicine

## 2022-11-26 VITALS — BP 120/74 | HR 94 | Temp 97.7°F | Ht 61.5 in | Wt 150.0 lb

## 2022-11-26 DIAGNOSIS — R21 Rash and other nonspecific skin eruption: Secondary | ICD-10-CM

## 2022-11-26 DIAGNOSIS — R7989 Other specified abnormal findings of blood chemistry: Secondary | ICD-10-CM | POA: Diagnosis not present

## 2022-11-26 DIAGNOSIS — G4489 Other headache syndrome: Secondary | ICD-10-CM

## 2022-11-26 DIAGNOSIS — R5383 Other fatigue: Secondary | ICD-10-CM

## 2022-11-26 DIAGNOSIS — M2550 Pain in unspecified joint: Secondary | ICD-10-CM | POA: Diagnosis not present

## 2022-11-26 LAB — CBC WITH DIFFERENTIAL/PLATELET
Basophils Absolute: 0 10*3/uL (ref 0.0–0.1)
Basophils Relative: 0.4 % (ref 0.0–3.0)
Eosinophils Absolute: 0.2 10*3/uL (ref 0.0–0.7)
Eosinophils Relative: 1.4 % (ref 0.0–5.0)
HCT: 37.9 % (ref 36.0–46.0)
Hemoglobin: 12.1 g/dL (ref 12.0–15.0)
Lymphocytes Relative: 13.6 % (ref 12.0–46.0)
Lymphs Abs: 1.8 10*3/uL (ref 0.7–4.0)
MCHC: 31.9 g/dL (ref 30.0–36.0)
MCV: 86.4 fl (ref 78.0–100.0)
Monocytes Absolute: 1 10*3/uL (ref 0.1–1.0)
Monocytes Relative: 7.6 % (ref 3.0–12.0)
Neutro Abs: 10.1 10*3/uL — ABNORMAL HIGH (ref 1.4–7.7)
Neutrophils Relative %: 77 % (ref 43.0–77.0)
Platelets: 426 10*3/uL — ABNORMAL HIGH (ref 150.0–400.0)
RBC: 4.39 Mil/uL (ref 3.87–5.11)
RDW: 14.5 % (ref 11.5–15.5)
WBC: 13.1 10*3/uL — ABNORMAL HIGH (ref 4.0–10.5)

## 2022-11-26 LAB — COMPREHENSIVE METABOLIC PANEL
ALT: 29 U/L (ref 0–35)
AST: 19 U/L (ref 0–37)
Albumin: 4 g/dL (ref 3.5–5.2)
Alkaline Phosphatase: 93 U/L (ref 39–117)
BUN: 19 mg/dL (ref 6–23)
CO2: 26 mEq/L (ref 19–32)
Calcium: 10.3 mg/dL (ref 8.4–10.5)
Chloride: 98 mEq/L (ref 96–112)
Creatinine, Ser: 0.63 mg/dL (ref 0.40–1.20)
GFR: 83.6 mL/min (ref >60.00)
Glucose, Bld: 102 mg/dL — ABNORMAL HIGH (ref 70–99)
Potassium: 3.5 mEq/L (ref 3.5–5.1)
Sodium: 135 mEq/L (ref 135–145)
Total Bilirubin: 0.5 mg/dL (ref 0.2–1.2)
Total Protein: 7.8 g/dL (ref 6.0–8.3)

## 2022-11-26 LAB — C-REACTIVE PROTEIN: CRP: 10 mg/dL (ref 0.5–20.0)

## 2022-11-26 LAB — SEDIMENTATION RATE: Sed Rate: 106 mm/hr — ABNORMAL HIGH (ref 0–30)

## 2022-11-26 MED ORDER — PREDNISONE 10 MG PO TABS: ORAL_TABLET | ORAL | 0 refills | Status: DC

## 2022-11-26 NOTE — Assessment & Plan Note (Signed)
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  Viral,?  Related to UTI she had in July, tick/mosquito Will check Lyme, CBC, CMP, autoimmune blood work Prednisone taper-hopefully this will work-advised not to take any other NSAIDs while taking it Will follow-up with her next week

## 2022-11-26 NOTE — Assessment & Plan Note (Signed)
Acute Ultrasound of abdomen done-results pending Recheck LFTs today I do not think she has any sort of obstruction, etc. given that she has no pain Given her other symptoms I think this is likely related to what ever process is going on-possibly viral/infectious versus related to reactive arthritis

## 2022-11-26 NOTE — Patient Instructions (Addendum)
      Blood work was ordered.   The lab is on the first floor.    Medications changes include :   prednisone taper  .     Return if symptoms worsen or fail to improve.

## 2022-11-26 NOTE — Assessment & Plan Note (Signed)
She did have a rash on her bilateral lower legs-red blotches that were tender and itchy Have resolved at this time Related to what ever process she has going on Check blood work as stated

## 2022-11-26 NOTE — Assessment & Plan Note (Signed)
Acute Associated with other symptoms Symptomatic treatment Check blood work as stated

## 2022-11-30 ENCOUNTER — Other Ambulatory Visit: Payer: Self-pay | Admitting: Internal Medicine

## 2022-11-30 ENCOUNTER — Encounter: Payer: Self-pay | Admitting: Internal Medicine

## 2022-11-30 DIAGNOSIS — K769 Liver disease, unspecified: Secondary | ICD-10-CM

## 2022-11-30 DIAGNOSIS — N281 Cyst of kidney, acquired: Secondary | ICD-10-CM

## 2022-12-03 LAB — RHEUMATOID FACTOR: Rheumatoid fact SerPl-aCnc: 12 [IU]/mL (ref <14)

## 2022-12-03 LAB — B. BURGDORFI ANTIBODIES BY WB

## 2022-12-03 LAB — ANA: Anti Nuclear Antibody (ANA): NEGATIVE

## 2022-12-03 LAB — CYCLIC CITRUL PEPTIDE ANTIBODY, IGG: Cyclic Citrullin Peptide Ab: <16 U

## 2022-12-08 DIAGNOSIS — H401112 Primary open-angle glaucoma, right eye, moderate stage: Secondary | ICD-10-CM | POA: Diagnosis not present

## 2022-12-08 DIAGNOSIS — H401121 Primary open-angle glaucoma, left eye, mild stage: Secondary | ICD-10-CM | POA: Diagnosis not present

## 2022-12-08 DIAGNOSIS — E119 Type 2 diabetes mellitus without complications: Secondary | ICD-10-CM | POA: Diagnosis not present

## 2022-12-08 DIAGNOSIS — H0102A Squamous blepharitis right eye, upper and lower eyelids: Secondary | ICD-10-CM | POA: Diagnosis not present

## 2022-12-08 DIAGNOSIS — H2513 Age-related nuclear cataract, bilateral: Secondary | ICD-10-CM | POA: Diagnosis not present

## 2022-12-08 DIAGNOSIS — H0102B Squamous blepharitis left eye, upper and lower eyelids: Secondary | ICD-10-CM | POA: Diagnosis not present

## 2022-12-08 LAB — HM DIABETES EYE EXAM

## 2022-12-16 ENCOUNTER — Ambulatory Visit
Admission: RE | Admit: 2022-12-16 | Discharge: 2022-12-16 | Disposition: A | Discharge status: Home / Self Care | Payer: Medicare Other | Source: Ambulatory Visit | Attending: Internal Medicine | Admitting: Internal Medicine

## 2022-12-16 ENCOUNTER — Other Ambulatory Visit: Payer: Medicare Other

## 2022-12-16 DIAGNOSIS — Z1231 Encounter for screening mammogram for malignant neoplasm of breast: Secondary | ICD-10-CM

## 2022-12-17 ENCOUNTER — Ambulatory Visit
Admission: RE | Admit: 2022-12-17 | Discharge: 2022-12-17 | Disposition: A | Discharge status: Home / Self Care | Payer: Medicare Other | Source: Ambulatory Visit | Attending: Internal Medicine | Admitting: Internal Medicine

## 2022-12-17 DIAGNOSIS — N281 Cyst of kidney, acquired: Secondary | ICD-10-CM

## 2022-12-17 DIAGNOSIS — I700 Atherosclerosis of aorta: Secondary | ICD-10-CM | POA: Diagnosis not present

## 2022-12-17 DIAGNOSIS — K7689 Other specified diseases of liver: Secondary | ICD-10-CM | POA: Diagnosis not present

## 2022-12-17 DIAGNOSIS — K5730 Diverticulosis of large intestine without perforation or abscess without bleeding: Secondary | ICD-10-CM | POA: Diagnosis not present

## 2022-12-17 DIAGNOSIS — K769 Liver disease, unspecified: Secondary | ICD-10-CM

## 2022-12-17 MED ORDER — IOPAMIDOL (ISOVUE-300) INJECTION 61%
200.0000 mL | Freq: Once | INTRAVENOUS | Status: AC | PRN
  Administered 2022-12-17: 100 mL via INTRAVENOUS

## 2022-12-30 ENCOUNTER — Encounter: Payer: Self-pay | Admitting: Internal Medicine

## 2023-01-07 ENCOUNTER — Ambulatory Visit: Payer: Medicare Other

## 2023-01-07 VITALS — Ht 61.5 in | Wt 148.0 lb

## 2023-01-07 DIAGNOSIS — Z0000 Encounter for general adult medical examination without abnormal findings: Secondary | ICD-10-CM

## 2023-01-07 NOTE — Progress Notes (Signed)
Subjective:   Tiffany Hood is a 82 y.o. female who presents for Medicare Annual (Subsequent) preventive examination.  Visit Complete: Virtual I connected with  Tiffany Hood on 01/07/23 by a audio enabled telemedicine application and verified that I am speaking with the correct person using two identifiers.  Patient Location: Home  Provider Location: Home Office  I discussed the limitations of evaluation and management by telemedicine. The patient expressed understanding and agreed to proceed.  Vital Signs: Because this visit was a virtual/telehealth visit, some criteria may be missing or patient reported. Any vitals not documented were not able to be obtained and vitals that have been documented are patient reported.  Patient Medicare AWV questionnaire was completed by the patient on 01/03/2023; I have confirmed that all information answered by patient is correct and no changes since this date.  Cardiac Risk Factors include: advanced age (>7men, >70 women);hypertension;diabetes mellitus;dyslipidemia     Objective:    Today's Vitals   01/07/23 1027  Weight: 148 lb (67.1 kg)  Height: 5' 1.5" (1.562 m)   Body mass index is 27.51 kg/m.     01/07/2023   10:38 AM 06/01/2022   11:44 AM 05/19/2022   10:15 AM 03/05/2022   11:17 AM 07/21/2020   12:40 PM 03/03/2020   12:06 PM 05/06/2015   11:23 AM  Advanced Directives  Does Patient Have a Medical Advance Directive? No No No No No No No  Would patient like information on creating a medical advance directive?  No - Patient declined No - Patient declined No - Patient declined Yes (MAU/Ambulatory/Procedural Areas - Information given) Yes (MAU/Ambulatory/Procedural Areas - Information given)     Current Medications (verified) Outpatient Encounter Medications as of 01/07/2023  Medication Sig   Biotin w/ Vitamins C & E (HAIR SKIN & NAILS GUMMIES PO) Take 2 each by mouth in the morning.   cycloSPORINE (RESTASIS) 0.05 % ophthalmic  emulsion Place 1 drop into both eyes in the morning and at bedtime.   diclofenac Sodium (VOLTAREN) 1 % GEL Apply 2 g topically 4 (four) times daily as needed (pain/swelling (leg/knee)).   fenofibrate (TRICOR) 145 MG tablet TAKE 1 TABLET BY MOUTH DAILY   Lancets (ONETOUCH ULTRASOFT) lancets USE AS INSTRUCTED TO CHECK  BLOOD SUGARS DAILY   losartan (COZAAR) 50 MG tablet TAKE 1 TABLET BY MOUTH DAILY   Melatonin 10 MG TABS Take 20 mg by mouth at bedtime.   meloxicam (MOBIC) 7.5 MG tablet Take 1 tablet (7.5 mg total) by mouth daily as needed for pain.   metroNIDAZOLE (METROGEL) 0.75 % gel Apply 1 Application topically daily as needed (facial redness).   Multiple Vitamins-Minerals (OCUVITE PRESERVISION PO) Take 1 tablet by mouth in the morning.   ONE TOUCH ULTRA TEST test strip USE TO CHECK BLOOD SUGAR  DAILY   Polyethyl Glycol-Propyl Glycol 0.4-0.3 % SOLN Place 1 drop into both eyes 2 (two) times daily as needed (dry/irritated eyes).   pravastatin (PRAVACHOL) 40 MG tablet TAKE 1 TABLET BY MOUTH DAILY   triamterene-hydrochlorothiazide (MAXZIDE) 75-50 MG tablet TAKE ONE-HALF TABLET BY MOUTH  DAILY   vitamin C (ASCORBIC ACID) 250 MG tablet Take 250 mg by mouth in the morning.   predniSONE (DELTASONE) 10 MG tablet Take 4 tabs po qd x 3 days, then 3 tabs po qd x 3 days, then 2 tabs po qd x 3 days, then 1 tab po qd x 3 days   No facility-administered encounter medications on file as of 01/07/2023.  Allergies (verified) Celestone [betamethasone]   History: Past Medical History:  Diagnosis Date   Breast cancer (HCC) left-2004 sx   Diabetes mellitus    TYPE 2   Diverticulitis    09/14/12   Diverticulosis    Heart murmur    History of radiation therapy    History of urinary tract infection    Hyperlipidemia    Hypertension    Osteoporosis    Fosamax Rxed 2/13   Personal history of colonic adenomas 11/15/2012   Personal history of radiation therapy 06/2002   Pneumonia    2013   Seasonal  allergies    Tingling    right leg    Past Surgical History:  Procedure Laterality Date   ABDOMINAL HYSTERECTOMY  1965   ovaries remain; for dysfunctional menses; ? endometriosis   ABDOMINAL SURGERY     BREAST BIOPSY Left 04/16/2002   malignant   BREAST LUMPECTOMY Left    colon polyps  2014   Dr Leone Payor   COLONOSCOPY  2004    Tics   DILATION AND CURETTAGE OF UTERUS     LAPAROSCOPIC SIGMOID COLECTOMY  04/13/2013   DR TOTH   LAPAROSCOPIC SIGMOID COLECTOMY N/A 04/13/2013   Procedure: LAPAROSCOPIC ASSISTED SIGMOID COLECTOMY;  Surgeon: Robyne Askew, MD;  Location: [facility] OR;  Service: General;  Laterality: N/A;   LUMBAR LAMINECTOMY/DECOMPRESSION MICRODISCECTOMY N/A 02/19/2015   Procedure: MICRODISCECTOMY AT L4-L5, CENTRAL DECOMPRESSION L4-L5 FOR SPINAL STENOSIS, FORAMINOTOMY FOR L4 L5 ROOT ON RIGHT;  Surgeon: Ranee Gosselin, MD;  Location: [facility];  Service: Orthopedics;  Laterality: N/A;   removal bone spur Left 2/14   2nd finger   TONSILLECTOMY AND ADENOIDECTOMY     TOTAL KNEE ARTHROPLASTY Left 06/01/2022   Procedure: TOTAL KNEE ARTHROPLASTY;  Surgeon: Durene Romans, MD;  Location: [facility];  Service: Orthopedics;  Laterality: Left;   Family History  Problem Relation Age of Onset   Stroke Mother 93   Hypertension Mother    Lung cancer Father        smoker   Cancer - Lung Father    Hypertension Brother    Heart disease Brother 16   Stroke Brother 10   Hypertension Brother    Hypertension Sister    Diabetes Neg Hx    Colon cancer Neg Hx    Esophageal cancer Neg Hx    Rectal cancer Neg Hx    Stomach cancer Neg Hx    Colon polyps Neg Hx    Social History   Socioeconomic History   Marital status: Widowed    Spouse name: Not on file   Number of children: 1   Years of education: Not on file   Highest education level: 12th grade  Occupational History   Occupation: Retired  Tobacco Use   Smoking status: Never   Smokeless tobacco: Never  Vaping Use   Vaping status: Never  Used  Substance and Sexual Activity   Alcohol use: No    Alcohol/week: 0.0 standard drinks of alcohol   Drug use: No   Sexual activity: Not on file  Other Topics Concern   Not on file  Social History Narrative   Exercise: no   Lives alone.   Social Determinants of Health   Financial Resource Strain: Low Risk  (10/25/2022)   Overall Financial Resource Strain (CARDIA)    Difficulty of Paying Living Expenses: Not hard at all  Food Insecurity: No Food Insecurity (10/25/2022)   Hunger Vital Sign    Worried About Running  Out of Food in the Last Year: Never true    Ran Out of Food in the Last Year: Never true  Transportation Needs: No Transportation Needs (10/25/2022)   PRAPARE - Administrator, Civil Service (Medical): No    Lack of Transportation (Non-Medical): No  Physical Activity: Insufficiently Active (01/07/2023)   Exercise Vital Sign    Days of Exercise per Week: 2 days    Minutes of Exercise per Session: 20 min  Stress: Stress Concern Present (10/25/2022)   [employer] of Occupational Health - Occupational Stress Questionnaire    Feeling of Stress : To some extent  Social Connections: Moderately Integrated (10/25/2022)   Social Connection and Isolation Panel [NHANES]    Frequency of Communication with Friends and Family: More than three times a week    Frequency of Social Gatherings with Friends and Family: Three times a week    Attends Religious Services: More than 4 times per year    Active Member of Clubs or Organizations: Yes    Attends Banker Meetings: 1 to 4 times per year    Marital Status: Widowed    Tobacco Counseling Counseling given: Not Answered   Clinical Intake:  Pre-visit preparation completed: Yes  Pain : No/denies pain     BMI - recorded: 27.51 Nutritional Status: BMI 25 -29 Overweight Nutritional Risks: None Diabetes: Yes CBG done?: No Did pt. bring in CBG monitor from home?: No  How often do you need to have  someone help you when you read instructions, pamphlets, or other written materials from your doctor or pharmacy?: (P) 1 - Never  Interpreter Needed?: No  Information entered by :: Wave Calzada, RMA   Activities of Daily Living    01/03/2023    4:50 PM 10/22/2022   10:18 AM  In your present state of health, do you have any difficulty performing the following activities:  Hearing? 0 0  Vision? 0 0  Difficulty concentrating or making decisions? 0 0  Walking or climbing stairs? 1 0  Dressing or bathing? 0 0  Doing errands, shopping? 0 0  Preparing Food and eating ? N N  Using the Toilet? N N  In the past six months, have you accidently leaked urine? N N  Do you have problems with loss of bowel control? N N  Managing your Medications? N N  Managing your Finances? N N  Housekeeping or managing your Housekeeping? N N    Patient Care Team: [name], MD as PCP - General (Internal Medicine) Axel Filler, Larna Daughters, NP as Nurse Practitioner (Hematology and Oncology) Szabat, Vinnie Level, [facility] (Inactive) as Pharmacist (Pharmacist) Sallye Lat, MD as Consulting Physician (Ophthalmology)  Indicate any recent Medical Services you may have received from other than [facility] providers in the past year (date may be approximate).     Assessment:   This is a routine wellness examination for Yajayra.  Hearing/Vision screen Hearing Screening - Comments:: Denies hearing difficulties   Vision Screening - Comments:: Wears eyeglasses   Goals Addressed             This Visit's Progress    Exercise 150 minutes per week (moderate activity)   Not on track    Exercise at least 30 minutes walk x 5 days a week;        Depression Screen    01/07/2023   10:41 AM 11/05/2022   11:55 AM 10/16/2021    1:58 PM 04/10/2021   12:32 PM 07/21/2020  1:01 PM 10/11/2019    2:51 PM 10/05/2018    9:07 AM  PHQ 2/9 Scores  PHQ - 2 Score 0 0 0 2 0 0 0  PHQ- 9 Score 2 0 0 8       Fall Risk     01/03/2023    4:50 PM 11/05/2022   11:55 AM 10/22/2022   10:18 AM 10/16/2021    1:58 PM 07/21/2020    1:02 PM  Fall Risk   Falls in the past year? 0 0 0 0 0  Number falls in past yr:  0  0 0  Injury with Fall?  0 0 0 0  Risk for fall due to :  No Fall Risks  No Fall Risks No Fall Risks  Follow up Falls evaluation completed;Falls prevention discussed Falls evaluation completed  Falls evaluation completed Falls evaluation completed    MEDICARE RISK AT HOME: Medicare Risk at Home Any stairs in or around the home?: No If so, are there any without handrails?: No Home free of loose throw rugs in walkways, pet beds, electrical cords, etc?: No Life alert?: No Use of a cane, walker or w/c?: No Grab bars in the bathroom?: Yes Shower chair or bench in shower?: Yes Elevated toilet seat or a handicapped toilet?: Yes  TIMED UP AND GO:  Was the test performed?  No    Cognitive Function:        01/07/2023   10:38 AM  6CIT Screen  What Year? 0 points  What month? 0 points  What time? 0 points  Count back from 20 0 points  Months in reverse 0 points  Repeat phrase 0 points  Total Score 0 points    Immunizations Immunization History  Administered Date(s) Administered   Fluad Quad(high Dose 65+) 12/15/2018, 12/03/2021   Influenza Whole 02/06/2007, 12/25/2007   Influenza, High Dose Seasonal PF 01/02/2013, 01/02/2014, 12/13/2016, 12/07/2017, 12/25/2019, 01/01/2021   Influenza,inj,quad, With Preservative 11/27/2016   Influenza-Unspecified 12/26/2014, 12/19/2015, 12/07/2017   PFIZER Comirnaty(Gray Top)Covid-19 Tri-Sucrose Vaccine 07/30/2020, 04/20/2022   PFIZER(Purple Top)SARS-COV-2 Vaccination 05/04/2019, 05/25/2019, 01/07/2020   Pfizer Covid-19 Vaccine Bivalent Booster 41yrs & up 12/29/2020   Pneumococcal Conjugate-13 09/26/2014   Pneumococcal Polysaccharide-23 03/30/2013   Td 03/28/2013   Zoster Recombinant(Shingrix) 10/30/2020, 04/02/2021   Zoster, Live 12/09/2011    TDAP status:  Due, Education has been provided regarding the importance of this vaccine. Advised may receive this vaccine at local pharmacy or Health Dept. Aware to provide a copy of the vaccination record if obtained from local pharmacy or Health Dept. Verbalized acceptance and understanding.  Flu Vaccine status: Due, Education has been provided regarding the importance of this vaccine. Advised may receive this vaccine at local pharmacy or Health Dept. Aware to provide a copy of the vaccination record if obtained from local pharmacy or Health Dept. Verbalized acceptance and understanding.  Pneumococcal vaccine status: Up to date  Covid-19 vaccine status: Information provided on how to obtain vaccines.   Qualifies for Shingles Vaccine? Yes   Zostavax completed Yes   Shingrix Completed?: Yes  Screening Tests Health Maintenance  Topic Date Due   COVID-19 Vaccine (7 - 2023-24 season) 11/28/2022   INFLUENZA VACCINE  06/27/2023 (Originally 10/28/2022)   DTaP/Tdap/Td (2 - Tdap) 03/29/2023   HEMOGLOBIN A1C  05/08/2023   Diabetic kidney evaluation - Urine ACR  11/05/2023   FOOT EXAM  11/05/2023   Diabetic kidney evaluation - eGFR measurement  11/26/2023   OPHTHALMOLOGY EXAM  12/08/2023   Medicare Annual  Wellness (AWV)  01/07/2024   DEXA SCAN  05/10/2025   Pneumonia Vaccine 2+ Years old  Completed   Zoster Vaccines- Shingrix  Completed   HPV VACCINES  Aged Out   Hepatitis C Screening  Discontinued    Health Maintenance  Health Maintenance Due  Topic Date Due   COVID-19 Vaccine (7 - 2023-24 season) 11/28/2022    Colorectal cancer screening: No longer required.   Mammogram status: Completed 12/17/2022. Repeat every year  Bone Density status: Completed 05/10/2022. Results reflect: Bone density results: OSTEOPENIA. Repeat every 2 years.  Lung Cancer Screening: (Low Dose CT Chest recommended if Age 15-80 years, 20 pack-year currently smoking OR have quit w/in 15years.) does not qualify.   Lung Cancer  Screening Referral: N/A  Additional Screening:  Hepatitis C Screening: does qualify; Completed 04/10/2020  Vision Screening: Recommended annual ophthalmology exams for early detection of glaucoma and other disorders of the eye. Is the patient up to date with their annual eye exam?  Yes  Who is the provider or what is the name of the office in which the patient attends annual eye exams? Dr. Dione Booze If pt is not established with a provider, would they like to be referred to a provider to establish care? No .   Dental Screening: Recommended annual dental exams for proper oral hygiene  Diabetic Foot Exam: Diabetic Foot Exam: Completed 11/05/2022  Community Resource Referral / Chronic Care Management: CRR required this visit?  No   CCM required this visit?  No     Plan:     I have personally reviewed and noted the following in the patient's chart:   Medical and social history Use of alcohol, tobacco or illicit drugs  Current medications and supplements including opioid prescriptions. Patient is not currently taking opioid prescriptions. Functional ability and status Nutritional status Physical activity Advanced directives List of other physicians Hospitalizations, surgeries, and ER visits in previous 12 months Vitals Screenings to include cognitive, depression, and falls Referrals and appointments  In addition, I have reviewed and discussed with patient certain preventive protocols, quality metrics, and best practice recommendations. A written personalized care plan for preventive services as well as general preventive health recommendations were provided to patient.     Lyndzie Zentz L Dyland Panuco, CMA   01/07/2023   After Visit Summary: (MyChart) Due to this being a telephonic visit, the after visit summary with patients personalized plan was offered to patient via MyChart   Nurse Notes: Patient is due for a Flu, Tdap and Covid vaccines.  She is aware to get the Covid at her pharmacy.   She is up to date on the rest of her health maintenance screenings.  She has no other concerns to address today.

## 2023-01-07 NOTE — Patient Instructions (Signed)
Tiffany Hood , Thank you for taking time to come for your Medicare Wellness Visit. I appreciate your ongoing commitment to your health goals. Please review the following plan we discussed and let me know if I can assist you in the future.   Referrals/Orders/Follow-Ups/Clinician Recommendations: You are due for a Flu, Tetanus and Covid vaccines.  It was nice talking to you today.  Keep up the good work.  This is a list of the screening recommended for you and due dates:  Health Maintenance  Topic Date Due   COVID-19 Vaccine (7 - 2023-24 season) 11/28/2022   Flu Shot  06/27/2023*   DTaP/Tdap/Td vaccine (2 - Tdap) 03/29/2023   Hemoglobin A1C  05/08/2023   Yearly kidney health urinalysis for diabetes  11/05/2023   Complete foot exam   11/05/2023   Yearly kidney function blood test for diabetes  11/26/2023   Eye exam for diabetics  12/08/2023   Medicare Annual Wellness Visit  01/07/2024   DEXA scan (bone density measurement)  05/10/2025   Pneumonia Vaccine  Completed   Zoster (Shingles) Vaccine  Completed   HPV Vaccine  Aged Out   Hepatitis C Screening  Discontinued  *Topic was postponed. The date shown is not the original due date.    Advanced directives: (Copy Requested) Please bring a copy of your health care power of attorney and living will to the office to be added to your chart at your convenience.  Next Medicare Annual Wellness Visit scheduled for next year: No

## 2023-03-07 ENCOUNTER — Inpatient Hospital Stay: Payer: Medicare Other | Attending: Adult Health | Admitting: Adult Health

## 2023-03-07 ENCOUNTER — Encounter: Payer: Self-pay | Admitting: Adult Health

## 2023-03-07 ENCOUNTER — Ambulatory Visit (HOSPITAL_COMMUNITY)
Admission: RE | Admit: 2023-03-07 | Discharge: 2023-03-07 | Disposition: A | Discharge status: Home / Self Care | Payer: Medicare Other | Source: Ambulatory Visit | Attending: Adult Health | Admitting: Adult Health

## 2023-03-07 VITALS — BP 141/68 | HR 91 | Temp 98.6°F | Resp 18 | Ht 61.5 in | Wt 152.1 lb

## 2023-03-07 DIAGNOSIS — Z853 Personal history of malignant neoplasm of breast: Secondary | ICD-10-CM | POA: Insufficient documentation

## 2023-03-07 DIAGNOSIS — M79601 Pain in right arm: Secondary | ICD-10-CM | POA: Diagnosis not present

## 2023-03-07 DIAGNOSIS — Z08 Encounter for follow-up examination after completed treatment for malignant neoplasm: Secondary | ICD-10-CM | POA: Insufficient documentation

## 2023-03-07 DIAGNOSIS — M19011 Primary osteoarthritis, right shoulder: Secondary | ICD-10-CM | POA: Diagnosis not present

## 2023-03-07 DIAGNOSIS — M25511 Pain in right shoulder: Secondary | ICD-10-CM | POA: Insufficient documentation

## 2023-03-07 NOTE — Progress Notes (Signed)
[facility] Cancer Follow up:    [name], MD [address] [place] [address]   DIAGNOSIS: History of breast cancer  SUMMARY OF ONCOLOGIC HISTORY: Per Dr. Earmon Phoenix note from 01/30/2015: #1 T1c N0 breast cancer status post lumpectomy 05/07/2002. She went on to complete radiation therapy adjuvantly on 08/19/2012.   #2 patient took Arimidex 1 mg daily from June 2004 to June 2014.  CURRENT THERAPY: observation  INTERVAL HISTORY:  Discussed the use of AI scribe software for clinical note transcription with the patient, who gave verbal consent to proceed.  Tiffany Hood 82 y.o. female with a history of breast cancer, diverticulitis, and recent urinary tract and kidney infections, presents with a two-month history of right arm pain. The pain is intermittent, exacerbated by lying on the affected side, and relieved somewhat by applying pressure. The patient denies any associated weakness, numbness, or other neurological symptoms.  The patient also reports a recent episode of elevated liver enzymes, which resolved without a clear etiology identified. This was investigated with ultrasound and CT scan, both of which were unremarkable. The patient's liver enzymes have since normalized.  The patient's recent urinary tract and kidney infections were treated with multiple rounds of antibiotics and prednisone. The patient reports feeling unwell during this period, with symptoms including pain in the groin area and a sensation of numbness in the right hand and arm. These symptoms have since resolved.  The patient also has a history of knee replacement surgery and reports some residual pain in the foot on the same side. The patient denies any other new symptoms or changes in health.   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   Glaucoma 04/10/2021   Osteoarthritis of left knee 04/10/2021   Chronic neuropathic pain 12/18/2020   Pain in right knee 02/10/2018   Seasonal allergic rhinitis 09/20/2016   Multiple thyroid nodules --stable for 5 years-no follow-up needed 08/12/2016   Diabetes (HCC) 08/08/2015   Spinal stenosis, lumbar region, 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  MEDICAL HISTORY: Past Medical History:  Diagnosis Date   Breast cancer (HCC) left-2004 sx   Diabetes mellitus    TYPE 2   Diverticulitis    09/14/12   Diverticulosis    Heart murmur    History of radiation therapy    History of urinary tract infection    Hyperlipidemia    Hypertension    Osteoporosis    Fosamax Rxed 2/13   Personal history of colonic adenomas 11/15/2012   Personal history of radiation therapy 06/2002   Pneumonia    2013   Seasonal allergies    Tingling    right leg     SURGICAL HISTORY: Past Surgical History:  Procedure Laterality Date   ABDOMINAL HYSTERECTOMY  1965   ovaries remain; for dysfunctional menses; ? endometriosis   ABDOMINAL SURGERY     BREAST BIOPSY Left 04/16/2002   malignant   BREAST LUMPECTOMY Left  colon polyps  2014   Dr Leone Payor   COLONOSCOPY  2004    Tics   DILATION AND CURETTAGE OF UTERUS     LAPAROSCOPIC SIGMOID COLECTOMY  04/13/2013   DR TOTH   LAPAROSCOPIC SIGMOID COLECTOMY N/A 04/13/2013   Procedure: LAPAROSCOPIC ASSISTED SIGMOID COLECTOMY;  Surgeon: Robyne Askew, MD;  Location: [facility] OR;  Service: General;  Laterality: N/A;   LUMBAR  LAMINECTOMY/DECOMPRESSION MICRODISCECTOMY N/A 02/19/2015   Procedure: MICRODISCECTOMY AT L4-L5, CENTRAL DECOMPRESSION L4-L5 FOR SPINAL STENOSIS, FORAMINOTOMY FOR L4 L5 ROOT ON RIGHT;  Surgeon: Ranee Gosselin, MD;  Location: [facility] ORS;  Service: Orthopedics;  Laterality: N/A;   removal bone spur Left 2/14   2nd finger   TONSILLECTOMY AND ADENOIDECTOMY     TOTAL KNEE ARTHROPLASTY Left 06/01/2022   Procedure: TOTAL KNEE ARTHROPLASTY;  Surgeon: Durene Romans, MD;  Location: [facility] ORS;  Service: Orthopedics;  Laterality: Left;    SOCIAL HISTORY: Social History   Socioeconomic History   Marital status: Widowed    Spouse name: Not on file   Number of children: 1   Years of education: Not on file   Highest education level: 12th grade  Occupational History   Occupation: Retired  Tobacco Use   Smoking status: Never   Smokeless tobacco: Never  Vaping Use   Vaping status: Never Used  Substance and Sexual Activity   Alcohol use: No    Alcohol/week: 0.0 standard drinks of alcohol   Drug use: No   Sexual activity: Not on file  Other Topics Concern   Not on file  Social History Narrative   Exercise: no   Lives alone.   Social Determinants of Health   Financial Resource Strain: Low Risk  (10/25/2022)   Overall Financial Resource Strain (CARDIA)    Difficulty of Paying Living Expenses: Not hard at all  Food Insecurity: No Food Insecurity (10/25/2022)   Hunger Vital Sign    Worried About Running Out of Food in the Last Year: Never true    Ran Out of Food in the Last Year: Never true  Transportation Needs: No Transportation Needs (10/25/2022)   PRAPARE - Administrator, Civil Service (Medical): No    Lack of Transportation (Non-Medical): No  Physical Activity: Insufficiently Active (01/07/2023)   Exercise Vital Sign    Days of Exercise per Week: 2 days    Minutes of Exercise per Session: 20 min  Stress: Stress Concern Present (10/25/2022)   [employer] of Occupational Health -  Occupational Stress Questionnaire    Feeling of Stress : To some extent  Social Connections: Moderately Integrated (10/25/2022)   Social Connection and Isolation Panel [NHANES]    Frequency of Communication with Friends and Family: More than three times a week    Frequency of Social Gatherings with Friends and Family: Three times a week    Attends Religious Services: More than 4 times per year    Active Member of Clubs or Organizations: Yes    Attends Banker Meetings: 1 to 4 times per year    Marital Status: Widowed  Intimate Partner Violence: Patient Unable To Answer (01/07/2023)   Humiliation, Afraid, Rape, and Kick questionnaire    Fear of Current or Ex-Partner: Patient unable to answer    Emotionally Abused: Patient unable to answer    Physically Abused: Patient unable to answer    Sexually Abused: Patient unable to answer    FAMILY HISTORY: Family History  Problem Relation Age of Onset  Stroke Mother 1   Hypertension Mother    Lung cancer Father        smoker   Cancer - Lung Father    Hypertension Brother    Heart disease Brother 18   Stroke Brother 84   Hypertension Brother    Hypertension Sister    Diabetes Neg Hx    Colon cancer Neg Hx    Esophageal cancer Neg Hx    Rectal cancer Neg Hx    Stomach cancer Neg Hx    Colon polyps Neg Hx     Review of Systems  Constitutional:  Negative for appetite change, chills, fatigue, fever and unexpected weight change.  HENT:   Negative for hearing loss, lump/mass and trouble swallowing.   Eyes:  Negative for eye problems and icterus.  Respiratory:  Negative for chest tightness, cough and shortness of breath.   Cardiovascular:  Negative for chest pain, leg swelling and palpitations.  Gastrointestinal:  Negative for abdominal distention, abdominal pain, constipation, diarrhea, nausea and vomiting.  Endocrine: Negative for hot flashes.  Genitourinary:  Negative for difficulty urinating.   Musculoskeletal:   Negative for arthralgias.  Skin:  Negative for itching and rash.  Neurological:  Negative for dizziness, extremity weakness, headaches and numbness.  Hematological:  Negative for adenopathy. Does not bruise/bleed easily.  Psychiatric/Behavioral:  Negative for depression. The patient is not nervous/anxious.       PHYSICAL EXAMINATION   Onc Performance Status - 03/07/23 1100       KPS SCALE   KPS % SCORE Able to carry on normal activity, minor s/s of disease             Vitals:   03/07/23 1113  BP: (!) 141/68  Pulse: 91  Resp: 18  Temp: 98.6 F (37 C)  SpO2: 97%    Physical Exam Constitutional:      General: She is not in acute distress.    Appearance: Normal appearance. She is not toxic-appearing.  HENT:     Head: Normocephalic and atraumatic.     Mouth/Throat:     Mouth: Mucous membranes are moist.     Pharynx: Oropharynx is clear. No oropharyngeal exudate or posterior oropharyngeal erythema.  Eyes:     General: No scleral icterus. Cardiovascular:     Rate and Rhythm: Normal rate and regular rhythm.     Pulses: Normal pulses.     Heart sounds: Normal heart sounds.  Pulmonary:     Effort: Pulmonary effort is normal.     Breath sounds: Normal breath sounds.  Chest:     Comments: Left breast s/p lumpectomy and radiation, no sign of local recurrence, right breast benign Abdominal:     General: Abdomen is flat. Bowel sounds are normal. There is no distension.     Palpations: Abdomen is soft.     Tenderness: There is no abdominal tenderness.  Musculoskeletal:        General: No swelling.     Cervical back: Neck supple.  Lymphadenopathy:     Cervical: No cervical adenopathy.     Upper Body:     Right upper body: No axillary adenopathy.     Left upper body: No axillary adenopathy.  Skin:    General: Skin is warm and dry.     Findings: No rash.  Neurological:     General: No focal deficit present.     Mental Status: She is alert.  Psychiatric:        Mood  and Affect:  Mood normal.        Behavior: Behavior normal.       ASSESSMENT and THERAPY PLAN:   History of cancer of left breast Breast Cancer Follow-up No clinical or radiographic sign of breast cancer recurrence. -Continue annual mammograms and breast exams. -RTC in 1 year for continued long-term f/u.   Right Arm Pain Chronic pain in the right arm, possibly related to arthritis. No signs of lymphedema or malignancy. -Order right arm X-ray to rule out unexpected pathology. -Continue shoulder exercises.  Liver Enzyme Elevation Previous elevation of liver enzymes, possibly related to urinary tract infection and kidney infection. Enzymes have returned to normal and imaging was unremarkable. -No further action required at this time.   All questions were answered. The patient knows to call the clinic with any problems, questions or concerns. We can certainly see the patient much sooner if necessary.  Total encounter time:20 minutes*in face-to-face visit time, chart review, lab review, care coordination, order entry, and documentation of the encounter time.    Lillard Anes, NP 03/07/23 1:26 PM Medical Oncology and Hematology [facility] [address] [place], [place] [address] Tel. [phone]    Fax. [phone]  *Total Encounter Time as defined by the Centers for Medicare and Medicaid Services includes, in addition to the face-to-face time of a patient visit (documented in the note above) non-face-to-face time: obtaining and reviewing outside history, ordering and reviewing medications, tests or procedures, care coordination (communications with other health care professionals or caregivers) and documentation in the medical record.

## 2023-03-07 NOTE — Assessment & Plan Note (Signed)
Breast Cancer Follow-up No clinical or radiographic sign of breast cancer recurrence. -Continue annual mammograms and breast exams. -RTC in 1 year for continued long-term f/u.   Right Arm Pain Chronic pain in the right arm, possibly related to arthritis. No signs of lymphedema or malignancy. -Order right arm X-ray to rule out unexpected pathology. -Continue shoulder exercises.  Liver Enzyme Elevation Previous elevation of liver enzymes, possibly related to urinary tract infection and kidney infection. Enzymes have returned to normal and imaging was unremarkable. -No further action required at this time.

## 2023-03-10 ENCOUNTER — Ambulatory Visit: Payer: Medicare Other | Admitting: Internal Medicine

## 2023-04-04 ENCOUNTER — Telehealth: Payer: Self-pay

## 2023-04-04 NOTE — Telephone Encounter (Addendum)
 Called pt. With message below. Pt stated that she did receive her results for her shoulder x-ray----- Message from Morna JAYSON Kendall sent at 04/01/2023  5:00 PM EST ----- I am not sure if the patient saw her results of her shoulder x-ray.  They resulted right as I was going out of the office. ----- Message ----- From: Interface, Rad Results In Sent: 03/18/2023  11:05 PM EST To: Morna Dalton Kendall, NP

## 2023-04-11 DIAGNOSIS — E119 Type 2 diabetes mellitus without complications: Secondary | ICD-10-CM | POA: Diagnosis not present

## 2023-04-11 DIAGNOSIS — H0102B Squamous blepharitis left eye, upper and lower eyelids: Secondary | ICD-10-CM | POA: Diagnosis not present

## 2023-04-11 DIAGNOSIS — H401121 Primary open-angle glaucoma, left eye, mild stage: Secondary | ICD-10-CM | POA: Diagnosis not present

## 2023-04-11 DIAGNOSIS — H401112 Primary open-angle glaucoma, right eye, moderate stage: Secondary | ICD-10-CM | POA: Diagnosis not present

## 2023-04-11 DIAGNOSIS — H2513 Age-related nuclear cataract, bilateral: Secondary | ICD-10-CM | POA: Diagnosis not present

## 2023-04-11 DIAGNOSIS — H0102A Squamous blepharitis right eye, upper and lower eyelids: Secondary | ICD-10-CM | POA: Diagnosis not present

## 2023-05-10 ENCOUNTER — Ambulatory Visit: Payer: Medicare Other | Admitting: Internal Medicine

## 2023-05-18 ENCOUNTER — Ambulatory Visit: Payer: Medicare Other | Admitting: Internal Medicine

## 2023-06-30 ENCOUNTER — Other Ambulatory Visit: Payer: Self-pay | Admitting: Internal Medicine

## 2023-07-03 ENCOUNTER — Encounter: Payer: Self-pay | Admitting: Internal Medicine

## 2023-07-03 NOTE — Progress Notes (Unsigned)
 Subjective:    Patient ID: Tiffany Hood, female    DOB: [birth year], 83 y.o.   MRN: [id number]     HPI [name] is here for follow up of her chronic medical problems.  Doing well.   No regular exercise.  Does some exercises inside the house some days.   Will start with garden and flower work in the yard soon and will be more active.    Medications and allergies reviewed with patient and updated if appropriate.  Current Outpatient Medications on File Prior to Visit  Medication Sig Dispense Refill   Biotin w/ Vitamins C & E (HAIR SKIN & NAILS GUMMIES PO) Take 2 each by mouth in the morning.     cycloSPORINE (RESTASIS) 0.05 % ophthalmic emulsion Place 1 drop into both eyes in the morning and at bedtime.     diclofenac Sodium (VOLTAREN) 1 % GEL Apply 2 g topically 4 (four) times daily as needed (pain/swelling (leg/knee)).     fenofibrate (TRICOR) 145 MG tablet TAKE 1 TABLET BY MOUTH DAILY 90 tablet 2   Lancets (ONETOUCH ULTRASOFT) lancets USE AS INSTRUCTED TO CHECK  BLOOD SUGARS DAILY 100 each 3   losartan (COZAAR) 50 MG tablet TAKE 1 TABLET BY MOUTH DAILY 100 tablet 2   meloxicam (MOBIC) 7.5 MG tablet Take 1 tablet (7.5 mg total) by mouth daily as needed for pain.     metroNIDAZOLE (METROGEL) 0.75 % gel Apply 1 Application topically daily as needed (facial redness).     Multiple Vitamins-Minerals (OCUVITE PRESERVISION PO) Take 1 tablet by mouth in the morning.     ONE TOUCH ULTRA TEST test strip USE TO CHECK BLOOD SUGAR  DAILY 100 each 3   Polyethyl Glycol-Propyl Glycol 0.4-0.3 % SOLN Place 1 drop into both eyes 2 (two) times daily as needed (dry/irritated eyes).     pravastatin (PRAVACHOL) 40 MG tablet TAKE 1 TABLET BY MOUTH DAILY 100 tablet 2   triamterene-hydrochlorothiazide (MAXZIDE) 75-50 MG tablet TAKE ONE-HALF TABLET BY MOUTH  DAILY 50 tablet 2   vitamin C (ASCORBIC ACID) 250 MG tablet Take 250 mg by mouth in the morning.     No current facility-administered  medications on file prior to visit.     Review of Systems  Constitutional:  Negative for fever.       Energy level is low  Respiratory:  Negative for cough, shortness of breath and wheezing.   Cardiovascular:  Negative for chest pain, palpitations and leg swelling.  Neurological:  Positive for headaches (rare). Negative for light-headedness.  Psychiatric/Behavioral:  Positive for sleep disturbance.        Objective:   Vitals:   07/04/23 1300 07/04/23 1337  BP: (!) 144/80 134/80  Pulse: 68   Temp: 98.2 F (36.8 C)   SpO2: 97%    BP Readings from Last 3 Encounters:  07/04/23 134/80  03/07/23 (!) 141/68  11/26/22 120/74   Wt Readings from Last 3 Encounters:  07/04/23 153 lb (69.4 kg)  03/07/23 152 lb 1.6 oz (69 kg)  01/07/23 148 lb (67.1 kg)   Body mass index is 28.44 kg/m.    Physical Exam Constitutional:      General: She is not in acute distress.    Appearance: Normal appearance.  HENT:     Head: Normocephalic and atraumatic.  Eyes:     Conjunctiva/sclera: Conjunctivae normal.  Cardiovascular:     Rate and Rhythm: Normal rate and regular rhythm.     Heart  sounds: Normal heart sounds.  Pulmonary:     Effort: Pulmonary effort is normal. No respiratory distress.     Breath sounds: Normal breath sounds. No wheezing.  Musculoskeletal:     Cervical back: Neck supple.     Right lower leg: No edema.     Left lower leg: No edema.  Lymphadenopathy:     Cervical: No cervical adenopathy.  Skin:    General: Skin is warm and dry.     Findings: No rash.  Neurological:     Mental Status: She is alert. Mental status is at baseline.  Psychiatric:        Mood and Affect: Mood normal.        Behavior: Behavior normal.        Lab Results  Component Value Date   WBC 7.2 07/04/2023   HGB 13.9 07/04/2023   HCT 41.0 07/04/2023   PLT 286.0 07/04/2023   GLUCOSE 107 (H) 07/04/2023   CHOL 173 07/04/2023   TRIG 179.0 (H) 07/04/2023   HDL 77.70 07/04/2023   LDLDIRECT  90.0 11/05/2022   LDLCALC 59 07/04/2023   ALT 17 07/04/2023   AST 21 07/04/2023   NA 136 07/04/2023   K 3.8 07/04/2023   CL 101 07/04/2023   CREATININE 0.72 07/04/2023   BUN 22 07/04/2023   CO2 24 07/04/2023   TSH 1.27 11/05/2022   INR 0.92 02/18/2015   HGBA1C 6.5 07/04/2023   MICROALBUR <0.7 11/05/2022     Assessment & Plan:    See Problem List for Assessment and Plan of chronic medical problems.

## 2023-07-03 NOTE — Patient Instructions (Addendum)
      Blood work was ordered.       Medications changes include :   None    A referral was ordered and someone will call you to schedule an appointment.     Return in about 6 months (around 01/03/2024) for Physical Exam.

## 2023-07-04 ENCOUNTER — Ambulatory Visit (INDEPENDENT_AMBULATORY_CARE_PROVIDER_SITE_OTHER): Payer: Medicare Other | Admitting: Internal Medicine

## 2023-07-04 VITALS — BP 134/80 | HR 68 | Temp 98.2°F | Ht 61.5 in | Wt 153.0 lb

## 2023-07-04 DIAGNOSIS — M25561 Pain in right knee: Secondary | ICD-10-CM | POA: Diagnosis not present

## 2023-07-04 DIAGNOSIS — E1169 Type 2 diabetes mellitus with other specified complication: Secondary | ICD-10-CM | POA: Diagnosis not present

## 2023-07-04 DIAGNOSIS — G8929 Other chronic pain: Secondary | ICD-10-CM

## 2023-07-04 DIAGNOSIS — E7849 Other hyperlipidemia: Secondary | ICD-10-CM

## 2023-07-04 DIAGNOSIS — I10 Essential (primary) hypertension: Secondary | ICD-10-CM

## 2023-07-04 LAB — COMPREHENSIVE METABOLIC PANEL WITH GFR
ALT: 17 U/L (ref 0–35)
AST: 21 U/L (ref 0–37)
Albumin: 4.8 g/dL (ref 3.5–5.2)
Alkaline Phosphatase: 59 U/L (ref 39–117)
BUN: 22 mg/dL (ref 6–23)
CO2: 24 meq/L (ref 19–32)
Calcium: 10.6 mg/dL — ABNORMAL HIGH (ref 8.4–10.5)
Chloride: 101 meq/L (ref 96–112)
Creatinine, Ser: 0.72 mg/dL (ref 0.40–1.20)
GFR: 78.47 mL/min (ref >60.00)
Glucose, Bld: 107 mg/dL — ABNORMAL HIGH (ref 70–99)
Potassium: 3.8 meq/L (ref 3.5–5.1)
Sodium: 136 meq/L (ref 135–145)
Total Bilirubin: 0.4 mg/dL (ref 0.2–1.2)
Total Protein: 7.7 g/dL (ref 6.0–8.3)

## 2023-07-04 LAB — CBC WITH DIFFERENTIAL/PLATELET
Basophils Absolute: 0.1 10*3/uL (ref 0.0–0.1)
Basophils Relative: 0.8 % (ref 0.0–3.0)
Eosinophils Absolute: 0.4 10*3/uL (ref 0.0–0.7)
Eosinophils Relative: 5.5 % — ABNORMAL HIGH (ref 0.0–5.0)
HCT: 41 % (ref 36.0–46.0)
Hemoglobin: 13.9 g/dL (ref 12.0–15.0)
Lymphocytes Relative: 23.5 % (ref 12.0–46.0)
Lymphs Abs: 1.7 10*3/uL (ref 0.7–4.0)
MCHC: 33.8 g/dL (ref 30.0–36.0)
MCV: 87.1 fl (ref 78.0–100.0)
Monocytes Absolute: 0.6 10*3/uL (ref 0.1–1.0)
Monocytes Relative: 8.9 % (ref 3.0–12.0)
Neutro Abs: 4.4 10*3/uL (ref 1.4–7.7)
Neutrophils Relative %: 61.3 % (ref 43.0–77.0)
Platelets: 286 10*3/uL (ref 150.0–400.0)
RBC: 4.71 Mil/uL (ref 3.87–5.11)
RDW: 15.5 % (ref 11.5–15.5)
WBC: 7.2 10*3/uL (ref 4.0–10.5)

## 2023-07-04 LAB — LIPID PANEL
Cholesterol: 173 mg/dL (ref 0–200)
HDL: 77.7 mg/dL (ref >39.00)
LDL Cholesterol: 59 mg/dL (ref 0–99)
NonHDL: 94.92
Total CHOL/HDL Ratio: 2
Triglycerides: 179 mg/dL — ABNORMAL HIGH (ref 0.0–149.0)
VLDL: 35.8 mg/dL (ref 0.0–40.0)

## 2023-07-04 LAB — HEMOGLOBIN A1C: Hgb A1c MFr Bld: 6.5 % (ref 4.6–6.5)

## 2023-07-04 NOTE — Assessment & Plan Note (Addendum)
 Chronic Blood pressure elevated - rechecked and it has improved Cbc, cmp Continue triamterene-HCTZ 75-50 mg tablet-1/2 tablet daily, losartan 50 mg daily

## 2023-07-04 NOTE — Assessment & Plan Note (Signed)
 Chronic  Lab Results  Component Value Date   HGBA1C 6.9 (H) 11/05/2022   Sugars well controlled Check A1c Continue Lifestyle control Stressed regular exercise, diabetic diet

## 2023-07-04 NOTE — Assessment & Plan Note (Signed)
 Chronic Regular exercise and healthy diet encouraged Check lipid panel  Continue pravastatin 40 mg daily, fenofibrate 145 mg daily

## 2023-07-04 NOTE — Assessment & Plan Note (Signed)
 Mild OA Hurts intermittently Takes meloxicam as needed

## 2023-07-05 ENCOUNTER — Encounter: Payer: Self-pay | Admitting: Internal Medicine

## 2023-07-15 ENCOUNTER — Encounter: Payer: Self-pay | Admitting: Internal Medicine

## 2023-07-17 MED ORDER — FENOFIBRATE 145 MG PO TABS: 145.0000 mg | ORAL_TABLET | Freq: Every day | ORAL | 2 refills | Status: DC

## 2023-08-15 DIAGNOSIS — H0102B Squamous blepharitis left eye, upper and lower eyelids: Secondary | ICD-10-CM | POA: Diagnosis not present

## 2023-08-15 DIAGNOSIS — H401112 Primary open-angle glaucoma, right eye, moderate stage: Secondary | ICD-10-CM | POA: Diagnosis not present

## 2023-08-15 DIAGNOSIS — E119 Type 2 diabetes mellitus without complications: Secondary | ICD-10-CM | POA: Diagnosis not present

## 2023-08-15 DIAGNOSIS — H2513 Age-related nuclear cataract, bilateral: Secondary | ICD-10-CM | POA: Diagnosis not present

## 2023-08-15 DIAGNOSIS — H401121 Primary open-angle glaucoma, left eye, mild stage: Secondary | ICD-10-CM | POA: Diagnosis not present

## 2023-08-15 DIAGNOSIS — H0102A Squamous blepharitis right eye, upper and lower eyelids: Secondary | ICD-10-CM | POA: Diagnosis not present

## 2023-09-01 ENCOUNTER — Other Ambulatory Visit: Payer: Self-pay | Admitting: Internal Medicine

## 2023-09-05 DIAGNOSIS — L821 Other seborrheic keratosis: Secondary | ICD-10-CM | POA: Diagnosis not present

## 2023-09-05 DIAGNOSIS — L570 Actinic keratosis: Secondary | ICD-10-CM | POA: Diagnosis not present

## 2023-09-05 DIAGNOSIS — L820 Inflamed seborrheic keratosis: Secondary | ICD-10-CM | POA: Diagnosis not present

## 2023-09-05 DIAGNOSIS — L720 Epidermal cyst: Secondary | ICD-10-CM | POA: Diagnosis not present

## 2023-09-22 ENCOUNTER — Ambulatory Visit: Payer: Self-pay

## 2023-09-22 ENCOUNTER — Ambulatory Visit (INDEPENDENT_AMBULATORY_CARE_PROVIDER_SITE_OTHER): Admitting: Internal Medicine

## 2023-09-22 ENCOUNTER — Encounter: Payer: Self-pay | Admitting: Internal Medicine

## 2023-09-22 VITALS — BP 120/70 | HR 79 | Temp 97.9°F | Ht 61.5 in | Wt 148.0 lb

## 2023-09-22 DIAGNOSIS — N390 Urinary tract infection, site not specified: Secondary | ICD-10-CM | POA: Diagnosis not present

## 2023-09-22 DIAGNOSIS — N3001 Acute cystitis with hematuria: Secondary | ICD-10-CM

## 2023-09-22 DIAGNOSIS — I10 Essential (primary) hypertension: Secondary | ICD-10-CM

## 2023-09-22 LAB — POC URINALSYSI DIPSTICK (AUTOMATED)
Bilirubin, UA: NEGATIVE
Glucose, UA: NEGATIVE
Ketones, UA: NEGATIVE
Protein, UA: POSITIVE — AB
Spec Grav, UA: >=1.03 — AB (ref 1.010–1.025)
Urobilinogen, UA: 0.2 U/dL
pH, UA: 6 (ref 5.0–8.0)

## 2023-09-22 MED ORDER — CIPROFLOXACIN HCL 250 MG PO TABS: 250.0000 mg | ORAL_TABLET | Freq: Two times a day (BID) | ORAL | 0 refills | Status: AC

## 2023-09-22 NOTE — Patient Instructions (Addendum)
       Medications changes include :   cipro  250 mg twice a day for 3 days   Uti prevention vitamin -- cranberry, d-mannose and probiotics     A referral was ordered for urology.     Return if symptoms worsen or fail to improve.

## 2023-09-22 NOTE — Assessment & Plan Note (Signed)
 Has had 3 UTI in the past year Referral to urology to evaluate

## 2023-09-22 NOTE — Assessment & Plan Note (Signed)
Chronic Blood pressure well controlled Continue triamterene-HCTZ 75-50 mg tablet-1/2 tablet daily, losartan 50 mg daily

## 2023-09-22 NOTE — Progress Notes (Signed)
 Subjective:    Patient ID: Tiffany Hood, female    DOB: 05-24-1940, 83 y.o.   MRN: [id number]      HPI Tiffany Hood is here for  Chief Complaint  Patient presents with   Urinary Tract Infection    Frequency, pressure; Started on Tuesday    ? UTI:  Her symptoms started 2 days ago.  She states bladder pressure, decreased urination, mild dysuria, urinary frequency, urinary urgency, and minimal back pain.   She denies abdominal pain, hematuria, nausea, fever      Medications and allergies reviewed with patient and updated if appropriate.  Current Outpatient Medications on File Prior to Visit  Medication Sig Dispense Refill   Biotin w/ Vitamins C & E (HAIR SKIN & NAILS GUMMIES PO) Take 2 each by mouth in the morning.     cycloSPORINE (RESTASIS) 0.05 % ophthalmic emulsion Place 1 drop into both eyes in the morning and at bedtime.     diclofenac Sodium (VOLTAREN) 1 % GEL Apply 2 g topically 4 (four) times daily as needed (pain/swelling (leg/knee)).     fenofibrate  (TRICOR ) 145 MG tablet Take 1 tablet (145 mg total) by mouth daily. 90 tablet 2   Lancets (ONETOUCH ULTRASOFT) lancets USE AS INSTRUCTED TO CHECK  BLOOD SUGARS DAILY 100 each 3   losartan  (COZAAR ) 50 MG tablet TAKE 1 TABLET BY MOUTH DAILY 100 tablet 2   meloxicam  (MOBIC ) 7.5 MG tablet Take 1 tablet (7.5 mg total) by mouth daily as needed for pain.     metroNIDAZOLE  (METROGEL ) 0.75 % gel Apply 1 Application topically daily as needed (facial redness).     Multiple Vitamins-Minerals (OCUVITE PRESERVISION PO) Take 1 tablet by mouth in the morning.     ONE TOUCH ULTRA TEST test strip USE TO CHECK BLOOD SUGAR  DAILY 100 each 3   Polyethyl Glycol-Propyl Glycol 0.4-0.3 % SOLN Place 1 drop into both eyes 2 (two) times daily as needed (dry/irritated eyes).     pravastatin  (PRAVACHOL ) 40 MG tablet TAKE 1 TABLET BY MOUTH DAILY 100 tablet 2   triamterene -hydrochlorothiazide  (MAXZIDE ) 75-50 MG tablet TAKE ONE-HALF TABLET BY MOUTH   DAILY 50 tablet 2   vitamin C (ASCORBIC ACID) 250 MG tablet Take 250 mg by mouth in the morning.     No current facility-administered medications on file prior to visit.    Review of Systems     Objective:   Vitals:   09/22/23 0913  BP: 120/70  Pulse: 79  Temp: 97.9 F (36.6 C)  SpO2: 97%   BP Readings from Last 3 Encounters:  09/22/23 120/70  07/04/23 134/80  03/07/23 (!) 141/68   Wt Readings from Last 3 Encounters:  09/22/23 148 lb (67.1 kg)  07/04/23 153 lb (69.4 kg)  03/07/23 152 lb 1.6 oz (69 kg)   Body mass index is 27.51 kg/m.    Physical Exam Constitutional:      General: She is not in acute distress.    Appearance: Normal appearance. She is not ill-appearing.  HENT:     Head: Normocephalic.   Eyes:     Conjunctiva/sclera: Conjunctivae normal.   Abdominal:     General: There is no distension.     Palpations: Abdomen is soft.     Tenderness: There is no abdominal tenderness. There is no right CVA tenderness, left CVA tenderness, guarding or rebound.   Skin:    General: Skin is warm and dry.   Neurological:     Mental Status:  She is alert.            Assessment & Plan:    See Problem List for Assessment and Plan of chronic medical problems.

## 2023-09-22 NOTE — Telephone Encounter (Signed)
 FYI Only or Action Required?: FYI only for provider.  Patient was last seen in primary care on 07/04/2023 by Geofm Glade PARAS, MD. Called Nurse Triage reporting Dysuria. Symptoms began several days ago. Interventions attempted: Nothing. Symptoms are: unchanged.  Triage Disposition: See Physician Within 24 Hours  Patient/caregiver understands and will follow disposition?: Yes  Appt scheduled with PCP today 0910.   Copied from CRM [id number]. Topic: Clinical - Red Word Triage >> Sep 22, 2023  8:30 AM Elle L wrote: Red Word that prompted transfer to Nurse Triage: The patient states she may have a kidney infection or a urinary tract infection as she is having frequent urination but no relief, burning, and there is foam in her urine. Reason for Disposition  Urinating more frequently than usual (i.e., frequency)  Answer Assessment - Initial Assessment Questions 1. SYMPTOM: What's the main symptom you're concerned about? (e.g., frequency, incontinence)     Frequency  2. ONSET: When did the  sx  start?     2-3 days  3. PAIN: Is there any pain? If Yes, ask: How bad is it? (Scale: 1-10; mild, moderate, severe)     yes 4. CAUSE: What do you think is causing the symptoms?     Possible UTI  5. OTHER SYMPTOMS: Do you have any other symptoms? (e.g., blood in urine, fever, flank pain, pain with urination)     Urgency and foamy  Protocols used: Urinary Symptoms-A-AH

## 2023-09-22 NOTE — Assessment & Plan Note (Signed)
 Acute Urine dip consistent with UTI Will send urine for culture Take the antibiotic as prescribed. Cipro  250 mg bid x 3 days Take tylenol  if needed.   Increase your water  intake.  Call if no improvement

## 2023-09-23 ENCOUNTER — Telehealth: Payer: Self-pay | Admitting: Internal Medicine

## 2023-09-23 LAB — UNLABELED: Test Ordered On Req: 3021

## 2023-09-23 NOTE — Telephone Encounter (Signed)
 Taken care of today.  Form was faxed. Lab tech did not label specimen when sending off to [facility] for testing.

## 2023-09-23 NOTE — Telephone Encounter (Signed)
 Copied from CRM [id number]. Topic: Clinical - Request for Lab/Test Order >> Sep 23, 2023  9:15 AM Viola FALCON wrote: Reason for CRM: Paulette from [facility] called regardin order for urine culture that was sent yesterday - says the tube of urine culture is missing the name. Please call [phone] - open 24/7  - Reference #[id number] D.

## 2023-09-28 ENCOUNTER — Ambulatory Visit: Payer: Self-pay | Admitting: Internal Medicine

## 2023-09-28 LAB — PAT ID TIQ DOC

## 2023-09-28 LAB — CULTURE, URINE COMPREHENSIVE

## 2023-10-05 ENCOUNTER — Other Ambulatory Visit: Payer: Self-pay | Admitting: Internal Medicine

## 2023-10-16 NOTE — Progress Notes (Signed)
 Chief Complaint: No chief complaint on file.   History of Present Illness:  Tiffany Hood is a 83 y.o. female who is seen in consultation from Geofm Glade PARAS, MD for evaluation of ***.   Past Medical History:  Past Medical History:  Diagnosis Date   Breast cancer (HCC) left-2004 sx   Diabetes mellitus    TYPE 2   Diverticulitis    09/14/12   Diverticulosis    Heart murmur    History of radiation therapy    History of urinary tract infection    Hyperlipidemia    Hypertension    Osteoporosis    Fosamax  Rxed 2/13   Personal history of colonic adenomas 11/15/2012   Personal history of radiation therapy 06/2002   Pneumonia    2013   Seasonal allergies    Tingling    right leg     Past Surgical History:  Past Surgical History:  Procedure Laterality Date   ABDOMINAL HYSTERECTOMY  1965   ovaries remain; for dysfunctional menses; ? endometriosis   ABDOMINAL SURGERY     BREAST BIOPSY Left 04/16/2002   malignant   BREAST LUMPECTOMY Left    colon polyps  2014   Dr Avram   COLONOSCOPY  2004    Tics   DILATION AND CURETTAGE OF UTERUS     LAPAROSCOPIC SIGMOID COLECTOMY  04/13/2013   DR TOTH   LAPAROSCOPIC SIGMOID COLECTOMY N/A 04/13/2013   Procedure: LAPAROSCOPIC ASSISTED SIGMOID COLECTOMY;  Surgeon: Deward GORMAN Curvin DOUGLAS, MD;  Location: [facility] OR;  Service: General;  Laterality: N/A;   LUMBAR LAMINECTOMY/DECOMPRESSION MICRODISCECTOMY N/A 02/19/2015   Procedure: MICRODISCECTOMY AT L4-L5, CENTRAL DECOMPRESSION L4-L5 FOR SPINAL STENOSIS, FORAMINOTOMY FOR L4 L5 ROOT ON RIGHT;  Surgeon: Tanda Heading, MD;  Location: [facility];  Service: Orthopedics;  Laterality: N/A;   removal bone spur Left 2/14   2nd finger   TONSILLECTOMY AND ADENOIDECTOMY     TOTAL KNEE ARTHROPLASTY Left 06/01/2022   Procedure: TOTAL KNEE ARTHROPLASTY;  Surgeon: Ernie Cough, MD;  Location: [facility];  Service: Orthopedics;  Laterality: Left;    Allergies:  Allergies  Allergen Reactions   Celestone   [Betamethasone ] Nausea Only    Sweated; face and chest with redness for several days     Family History:  Family History  Problem Relation Age of Onset   Stroke Mother 35   Hypertension Mother    Lung cancer Father        smoker   Cancer - Lung Father    Hypertension Brother    Heart disease Brother 30   Stroke Brother 62   Hypertension Brother    Hypertension Sister    Diabetes Neg Hx    Colon cancer Neg Hx    Esophageal cancer Neg Hx    Rectal cancer Neg Hx    Stomach cancer Neg Hx    Colon polyps Neg Hx     Social History:  Social History   Tobacco Use   Smoking status: Never   Smokeless tobacco: Never  Vaping Use   Vaping status: Never Used  Substance Use Topics   Alcohol  use: No    Alcohol /week: 0.0 standard drinks of alcohol    Drug use: No    Review of symptoms:  Constitutional:  Negative for unexplained weight loss, night sweats, fever, chills ENT:  Negative for nose bleeds, sinus pain, painful swallowing CV:  Negative for chest pain, shortness of breath, exercise intolerance, palpitations, loss of consciousness Resp:  Negative for cough,  wheezing, shortness of breath GI:  Negative for nausea, vomiting, diarrhea, bloody stools GU:  Positives noted in HPI; otherwise negative for gross hematuria, dysuria, urinary incontinence Neuro:  Negative for seizures, poor balance, limb weakness, slurred speech Psych:  Negative for lack of energy, depression, anxiety Endocrine:  Negative for polydipsia, polyuria, symptoms of hypoglycemia (dizziness, hunger, sweating) Hematologic:  Negative for anemia, purpura, petechia, prolonged or excessive bleeding, use of anticoagulants  Allergic:  Negative for difficulty breathing or choking as a result of exposure to anything; no shellfish allergy; no allergic response (rash/itch) to materials, foods  Physical exam: There were no vitals taken for this visit. GENERAL APPEARANCE:  Well appearing, well developed, well nourished,  NAD HEENT: Atraumatic, Normocephalic. NECK: Normal appearance LUNGS: Normal inspiratory and expiratory excursion HEART: Regular Rate ABDOMEN: *** EXTREMITIES: Moves all extremities well.  Without clubbing, cyanosis, or edema. NEUROLOGIC:  Alert and oriented x 3, normal gait, CN II-XII grossly intact.  MENTAL STATUS:  Appropriate. SKIN:  Warm, dry and intact.    Results: No results found for this or any previous visit (from the past 24 hours).  I have reviewed prior patient's records  I have reviewed referring/prior physicians records  I have reviewed urinalysis  I have reviewed prior urine cultures  I reviewed prior imaging studies  Assessment: No diagnosis found.   Plan: ***

## 2023-10-17 ENCOUNTER — Ambulatory Visit: Admitting: Urology

## 2023-10-17 VITALS — BP 177/78 | HR 111 | Ht 60.0 in | Wt 149.0 lb

## 2023-10-17 DIAGNOSIS — N952 Postmenopausal atrophic vaginitis: Secondary | ICD-10-CM

## 2023-10-17 DIAGNOSIS — N390 Urinary tract infection, site not specified: Secondary | ICD-10-CM

## 2023-10-17 DIAGNOSIS — Z87440 Personal history of urinary (tract) infections: Secondary | ICD-10-CM | POA: Diagnosis not present

## 2023-10-17 DIAGNOSIS — Z853 Personal history of malignant neoplasm of breast: Secondary | ICD-10-CM

## 2023-10-17 LAB — URINALYSIS, ROUTINE W REFLEX MICROSCOPIC
Bilirubin, UA: NEGATIVE
Glucose, UA: NEGATIVE
Ketones, UA: NEGATIVE
Leukocytes,UA: NEGATIVE
Nitrite, UA: NEGATIVE
Protein,UA: NEGATIVE
RBC, UA: NEGATIVE
Specific Gravity, UA: <1.005 — ABNORMAL LOW (ref 1.005–1.030)
Urobilinogen, Ur: 0.2 mg/dL (ref 0.2–1.0)
pH, UA: 5.5 (ref 5.0–7.5)

## 2023-10-17 LAB — BLADDER SCAN AMB NON-IMAGING: Scan Result: 17

## 2023-10-18 ENCOUNTER — Telehealth: Payer: Self-pay

## 2023-10-18 MED ORDER — ESTRADIOL 0.1 MG/GM VA CREA
TOPICAL_CREAM | VAGINAL | 3 refills | Status: AC
Start: 2023-10-18 — End: ?

## 2023-10-18 NOTE — Telephone Encounter (Signed)
 Matilda Senior, MD  Koleen Cree C, LPN Let pt know that we got the green light from oncology to use estrogen cream--I sent scrip in.   LMOM making pt aware.

## 2023-10-18 NOTE — Addendum Note (Signed)
 Addended by: MATILDA SENIOR on: 10/18/2023 12:44 PM   Modules accepted: Orders

## 2023-10-20 ENCOUNTER — Encounter: Payer: Self-pay | Admitting: Internal Medicine

## 2023-10-20 ENCOUNTER — Encounter: Payer: Self-pay | Admitting: Adult Health

## 2023-11-03 ENCOUNTER — Other Ambulatory Visit: Payer: Self-pay | Admitting: Adult Health

## 2023-11-03 DIAGNOSIS — Z1231 Encounter for screening mammogram for malignant neoplasm of breast: Secondary | ICD-10-CM

## 2023-11-04 ENCOUNTER — Encounter: Payer: Self-pay | Admitting: Adult Health

## 2023-11-07 ENCOUNTER — Other Ambulatory Visit: Payer: Self-pay | Admitting: Internal Medicine

## 2023-12-19 ENCOUNTER — Ambulatory Visit
Admission: RE | Admit: 2023-12-19 | Discharge: 2023-12-19 | Disposition: A | Discharge status: Home / Self Care | Source: Ambulatory Visit | Attending: Adult Health | Admitting: Adult Health

## 2023-12-19 DIAGNOSIS — Z1231 Encounter for screening mammogram for malignant neoplasm of breast: Secondary | ICD-10-CM | POA: Diagnosis not present

## 2023-12-22 ENCOUNTER — Other Ambulatory Visit: Payer: Self-pay | Admitting: Adult Health

## 2023-12-22 DIAGNOSIS — R928 Other abnormal and inconclusive findings on diagnostic imaging of breast: Secondary | ICD-10-CM

## 2023-12-26 DIAGNOSIS — H0102B Squamous blepharitis left eye, upper and lower eyelids: Secondary | ICD-10-CM | POA: Diagnosis not present

## 2023-12-26 DIAGNOSIS — H401112 Primary open-angle glaucoma, right eye, moderate stage: Secondary | ICD-10-CM | POA: Diagnosis not present

## 2023-12-26 DIAGNOSIS — E119 Type 2 diabetes mellitus without complications: Secondary | ICD-10-CM | POA: Diagnosis not present

## 2023-12-26 DIAGNOSIS — H401121 Primary open-angle glaucoma, left eye, mild stage: Secondary | ICD-10-CM | POA: Diagnosis not present

## 2023-12-26 DIAGNOSIS — H2513 Age-related nuclear cataract, bilateral: Secondary | ICD-10-CM | POA: Diagnosis not present

## 2023-12-26 DIAGNOSIS — H0102A Squamous blepharitis right eye, upper and lower eyelids: Secondary | ICD-10-CM | POA: Diagnosis not present

## 2023-12-26 LAB — OPHTHALMOLOGY REPORT-SCANNED

## 2024-01-02 ENCOUNTER — Ambulatory Visit
Admission: RE | Admit: 2024-01-02 | Discharge: 2024-01-02 | Disposition: A | Source: Ambulatory Visit | Attending: Adult Health | Admitting: Adult Health

## 2024-01-02 ENCOUNTER — Ambulatory Visit

## 2024-01-02 ENCOUNTER — Ambulatory Visit
Admission: RE | Admit: 2024-01-02 | Discharge: 2024-01-02 | Disposition: A | Discharge status: Home / Self Care | Source: Ambulatory Visit | Attending: Adult Health | Admitting: Adult Health

## 2024-01-02 DIAGNOSIS — R928 Other abnormal and inconclusive findings on diagnostic imaging of breast: Secondary | ICD-10-CM | POA: Diagnosis not present

## 2024-01-02 DIAGNOSIS — N6310 Unspecified lump in the right breast, unspecified quadrant: Secondary | ICD-10-CM | POA: Diagnosis not present

## 2024-01-03 ENCOUNTER — Encounter (INDEPENDENT_AMBULATORY_CARE_PROVIDER_SITE_OTHER): Payer: Self-pay | Admitting: General Surgery

## 2024-01-03 ENCOUNTER — Other Ambulatory Visit: Payer: Self-pay | Admitting: Internal Medicine

## 2024-01-03 DIAGNOSIS — N6310 Unspecified lump in the right breast, unspecified quadrant: Secondary | ICD-10-CM

## 2024-01-06 ENCOUNTER — Ambulatory Visit
Admission: RE | Admit: 2024-01-06 | Discharge: 2024-01-06 | Disposition: A | Discharge status: Home / Self Care | Source: Ambulatory Visit | Attending: Internal Medicine | Admitting: Internal Medicine

## 2024-01-06 ENCOUNTER — Other Ambulatory Visit: Payer: Self-pay | Admitting: Internal Medicine

## 2024-01-06 DIAGNOSIS — N6310 Unspecified lump in the right breast, unspecified quadrant: Secondary | ICD-10-CM

## 2024-01-06 DIAGNOSIS — N6012 Diffuse cystic mastopathy of left breast: Secondary | ICD-10-CM | POA: Diagnosis not present

## 2024-01-09 ENCOUNTER — Encounter: Payer: Self-pay | Admitting: Internal Medicine

## 2024-01-09 NOTE — Progress Notes (Unsigned)
 Subjective:    Patient ID: Tiffany Hood, female    DOB: [birth year], 83 y.o.   MRN: [id number]      HPI Tiffany Hood is here for a Physical exam and her chronic medical problems.   Just had breast biopsy - has hx of L breast ca   Medications and allergies reviewed with patient and updated if appropriate.  Current Outpatient Medications on File Prior to Visit  Medication Sig Dispense Refill   Biotin w/ Vitamins C & E (HAIR SKIN & NAILS GUMMIES PO) Take 2 each by mouth in the morning.     cycloSPORINE (RESTASIS) 0.05 % ophthalmic emulsion Place 1 drop into both eyes in the morning and at bedtime.     diclofenac Sodium (VOLTAREN) 1 % GEL Apply 2 g topically 4 (four) times daily as needed (pain/swelling (leg/knee)).     estradiol  (ESTRACE ) 0.1 MG/GM vaginal cream Apply a pea-sized amount to vaginal opening using index finger 2-3 nights/week 42.5 g 3   fenofibrate  (TRICOR ) 145 MG tablet Take 1 tablet (145 mg total) by mouth daily. 90 tablet 2   Lancets (ONETOUCH ULTRASOFT) lancets USE AS INSTRUCTED TO CHECK  BLOOD SUGARS DAILY 100 each 3   losartan  (COZAAR ) 50 MG tablet TAKE 1 TABLET BY MOUTH DAILY 100 tablet 2   meloxicam  (MOBIC ) 7.5 MG tablet TAKE 1 TABLET BY MOUTH DAILY 100 tablet 2   metroNIDAZOLE  (METROGEL ) 0.75 % gel Apply 1 Application topically daily as needed (facial redness).     Multiple Vitamins-Minerals (OCUVITE PRESERVISION PO) Take 1 tablet by mouth in the morning.     ONE TOUCH ULTRA TEST test strip USE TO CHECK BLOOD SUGAR  DAILY 100 each 3   Polyethyl Glycol-Propyl Glycol 0.4-0.3 % SOLN Place 1 drop into both eyes 2 (two) times daily as needed (dry/irritated eyes).     pravastatin  (PRAVACHOL ) 40 MG tablet TAKE 1 TABLET BY MOUTH DAILY 100 tablet 2   triamterene -hydrochlorothiazide  (MAXZIDE ) 75-50 MG tablet TAKE ONE-HALF TABLET BY MOUTH  DAILY 50 tablet 2   vitamin C (ASCORBIC ACID) 250 MG tablet Take 250 mg by mouth in the morning.     No current facility-administered  medications on file prior to visit.    Review of Systems     Objective:  There were no vitals filed for this visit. There were no vitals filed for this visit. There is no height or weight on file to calculate BMI.  BP Readings from Last 3 Encounters:  10/17/23 (!) 177/78  09/22/23 120/70  07/04/23 134/80    Wt Readings from Last 3 Encounters:  10/17/23 149 lb (67.6 kg)  09/22/23 148 lb (67.1 kg)  07/04/23 153 lb (69.4 kg)       Physical Exam Constitutional: She appears well-developed and well-nourished. No distress.  HENT:  Head: Normocephalic and atraumatic.  Right Ear: External ear normal. Normal ear canal and TM Left Ear: External ear normal.  Normal ear canal and TM Mouth/Throat: Oropharynx is clear and moist.  Eyes: Conjunctivae normal.  Neck: Neck supple. No tracheal deviation present. No thyromegaly present.  No carotid bruit  Cardiovascular: Normal rate, regular rhythm and normal heart sounds.   No murmur heard.  No edema. Pulmonary/Chest: Effort normal and breath sounds normal. No respiratory distress. She has no wheezes. She has no rales.  Breast: deferred   Abdominal: Soft. She exhibits no distension. There is no tenderness.  Lymphadenopathy: She has no cervical adenopathy.  Skin: Skin is warm and dry. She  is not diaphoretic.  Psychiatric: She has a normal mood and affect. Her behavior is normal.     Lab Results  Component Value Date   WBC 7.2 07/04/2023   HGB 13.9 07/04/2023   HCT 41.0 07/04/2023   PLT 286.0 07/04/2023   GLUCOSE 107 (H) 07/04/2023   CHOL 173 07/04/2023   TRIG 179.0 (H) 07/04/2023   HDL 77.70 07/04/2023   LDLDIRECT 90.0 11/05/2022   LDLCALC 59 07/04/2023   ALT 17 07/04/2023   AST 21 07/04/2023   NA 136 07/04/2023   K 3.8 07/04/2023   CL 101 07/04/2023   CREATININE 0.72 07/04/2023   BUN 22 07/04/2023   CO2 24 07/04/2023   TSH 1.27 11/05/2022   INR 0.92 02/18/2015   HGBA1C 6.5 07/04/2023   MICROALBUR 1.0 12/25/2008          Assessment & Plan:   Physical exam: Screening blood work  ordered Exercise   Weight   Substance abuse  none   Reviewed recommended immunizations.   Health Maintenance  Topic Date Due   Diabetic kidney evaluation - Urine ACR  12/25/2009   Influenza Vaccine  10/28/2023   FOOT EXAM  11/05/2023   COVID-19 Vaccine (7 - 2025-26 season) 11/28/2023   Medicare Annual Wellness (AWV)  01/07/2024   HEMOGLOBIN A1C  01/03/2024   Diabetic kidney evaluation - eGFR measurement  07/03/2024   OPHTHALMOLOGY EXAM  12/25/2024   DEXA SCAN  05/10/2025   DTaP/Tdap/Td (3 - Td or Tdap) 06/28/2033   Pneumococcal Vaccine: 50+ Years  Completed   Zoster Vaccines- Shingrix  Completed   Meningococcal B Vaccine  Aged Out   Hepatitis C Screening  Discontinued          See Problem List for Assessment and Plan of chronic medical problems.

## 2024-01-09 NOTE — Patient Instructions (Addendum)
 Get flu and tetanus vaccine at the pharmacy   Blood work was ordered.       Medications changes include :   you can try lidocaine  gel or a patch  - both over the counter to numb the skin on the right upper leg.   Estrace vaginal cream.       Return in about 6 months (around 07/10/2024) for follow up.    Health Maintenance, Female Adopting a healthy lifestyle and getting preventive care are important in promoting health and wellness. Ask your health care provider about: The right schedule for you to have regular tests and exams. Things you can do on your own to prevent diseases and keep yourself healthy. What should I know about diet, weight, and exercise? Eat a healthy diet  Eat a diet that includes plenty of vegetables, fruits, low-fat dairy products, and lean protein. Do not eat a lot of foods that are high in solid fats, added sugars, or sodium. Maintain a healthy weight Body mass index (BMI) is used to identify weight problems. It estimates body fat based on height and weight. Your health care provider can help determine your BMI and help you achieve or maintain a healthy weight. Get regular exercise Get regular exercise. This is one of the most important things you can do for your health. Most adults should: Exercise for at least 150 minutes each week. The exercise should increase your heart rate and make you sweat (moderate-intensity exercise). Do strengthening exercises at least twice a week. This is in addition to the moderate-intensity exercise. Spend less time sitting. Even light physical activity can be beneficial. Watch cholesterol and blood lipids Have your blood tested for lipids and cholesterol at 82 years of age, then have this test every 5 years. Have your cholesterol levels checked more often if: Your lipid or cholesterol levels are high. You are older than 82 years of age. You are at high risk for heart disease. What should I know about cancer  screening? Depending on your health history and family history, you may need to have cancer screening at various ages. This may include screening for: Breast cancer. Cervical cancer. Colorectal cancer. Skin cancer. Lung cancer. What should I know about heart disease, diabetes, and high blood pressure? Blood pressure and heart disease High blood pressure causes heart disease and increases the risk of stroke. This is more likely to develop in people who have high blood pressure readings or are overweight. Have your blood pressure checked: Every 3-5 years if you are 90-70 years of age. Every year if you are 2 years old or older. Diabetes Have regular diabetes screenings. This checks your fasting blood sugar level. Have the screening done: Once every three years after age 53 if you are at a normal weight and have a low risk for diabetes. More often and at a younger age if you are overweight or have a high risk for diabetes. What should I know about preventing infection? Hepatitis B If you have a higher risk for hepatitis B, you should be screened for this virus. Talk with your health care provider to find out if you are at risk for hepatitis B infection. Hepatitis C Testing is recommended for: Everyone born from [birth year] through 1964. Anyone with known risk factors for hepatitis C. Sexually transmitted infections (STIs) Get screened for STIs, including gonorrhea and chlamydia, if: You are sexually active and are younger than 82 years of age. You are older than 82 years of age  and your health care provider tells you that you are at risk for this type of infection. Your sexual activity has changed since you were last screened, and you are at increased risk for chlamydia or gonorrhea. Ask your health care provider if you are at risk. Ask your health care provider about whether you are at high risk for HIV. Your health care provider may recommend a prescription medicine to help prevent HIV  infection. If you choose to take medicine to prevent HIV, you should first get tested for HIV. You should then be tested every 3 months for as long as you are taking the medicine. Pregnancy If you are about to stop having your period (premenopausal) and you may become pregnant, seek counseling before you get pregnant. Take 400 to 800 micrograms (mcg) of folic acid every day if you become pregnant. Ask for birth control (contraception) if you want to prevent pregnancy. Osteoporosis and menopause Osteoporosis is a disease in which the bones lose minerals and strength with aging. This can result in bone fractures. If you are 5 years old or older, or if you are at risk for osteoporosis and fractures, ask your health care provider if you should: Be screened for bone loss. Take a calcium  or vitamin D  supplement to lower your risk of fractures. Be given hormone replacement therapy (HRT) to treat symptoms of menopause. Follow these instructions at home: Alcohol use Do not drink alcohol if: Your health care provider tells you not to drink. You are pregnant, may be pregnant, or are planning to become pregnant. If you drink alcohol: Limit how much you have to: 0-1 drink a day. Know how much alcohol is in your drink. In the [place] one drink equals one 12 oz bottle of beer (355 mL), one 5 oz glass of wine (148 mL), or one 1 oz glass of hard liquor (44 mL). Lifestyle Do not use any products that contain nicotine or tobacco. These products include cigarettes, chewing tobacco, and vaping devices, such as e-cigarettes. If you need help quitting, ask your health care provider. Do not use street drugs. Do not share needles. Ask your health care provider for help if you need support or information about quitting drugs. General instructions Schedule regular health, dental, and eye exams. Stay current with your vaccines. Tell your health care provider if: You often feel depressed. You have ever been abused  or do not feel safe at home. Summary Adopting a healthy lifestyle and getting preventive care are important in promoting health and wellness. Follow your health care provider's instructions about healthy diet, exercising, and getting tested or screened for diseases. Follow your health care provider's instructions on monitoring your cholesterol and blood pressure. This information is not intended to replace advice given to you by your health care provider. Make sure you discuss any questions you have with your health care provider. Document Revised: 08/04/2020 Document Reviewed: 08/04/2020 Elsevier Patient Education  2024 [employer].

## 2024-01-10 ENCOUNTER — Ambulatory Visit: Admitting: Internal Medicine

## 2024-01-10 VITALS — BP 132/78 | HR 60 | Temp 98.0°F | Ht 60.0 in | Wt 149.0 lb

## 2024-01-10 DIAGNOSIS — M1711 Unilateral primary osteoarthritis, right knee: Secondary | ICD-10-CM | POA: Diagnosis not present

## 2024-01-10 DIAGNOSIS — E785 Hyperlipidemia, unspecified: Secondary | ICD-10-CM

## 2024-01-10 DIAGNOSIS — Z0000 Encounter for general adult medical examination without abnormal findings: Secondary | ICD-10-CM

## 2024-01-10 DIAGNOSIS — I152 Hypertension secondary to endocrine disorders: Secondary | ICD-10-CM

## 2024-01-10 DIAGNOSIS — E1159 Type 2 diabetes mellitus with other circulatory complications: Secondary | ICD-10-CM

## 2024-01-10 DIAGNOSIS — E1169 Type 2 diabetes mellitus with other specified complication: Secondary | ICD-10-CM

## 2024-01-10 DIAGNOSIS — M85839 Other specified disorders of bone density and structure, unspecified forearm: Secondary | ICD-10-CM | POA: Diagnosis not present

## 2024-01-10 LAB — CBC
HCT: 39.9 % (ref 36.0–46.0)
Hemoglobin: 13.3 g/dL (ref 12.0–15.0)
MCHC: 33.3 g/dL (ref 30.0–36.0)
MCV: 87.1 fl (ref 78.0–100.0)
Platelets: 268 K/uL (ref 150.0–400.0)
RBC: 4.58 Mil/uL (ref 3.87–5.11)
RDW: 14.8 % (ref 11.5–15.5)
WBC: 7.6 K/uL (ref 4.0–10.5)

## 2024-01-10 LAB — COMPREHENSIVE METABOLIC PANEL WITH GFR
ALT: 16 U/L (ref 0–35)
AST: 20 U/L (ref 0–37)
Albumin: 4.8 g/dL (ref 3.5–5.2)
Alkaline Phosphatase: 44 U/L (ref 39–117)
BUN: 23 mg/dL (ref 6–23)
CO2: 27 meq/L (ref 19–32)
Calcium: 10.6 mg/dL — ABNORMAL HIGH (ref 8.4–10.5)
Chloride: 100 meq/L (ref 96–112)
Creatinine, Ser: 0.83 mg/dL (ref 0.40–1.20)
GFR: 65.92 mL/min (ref >60.00)
Glucose, Bld: 94 mg/dL (ref 70–99)
Potassium: 3.9 meq/L (ref 3.5–5.1)
Sodium: 137 meq/L (ref 135–145)
Total Bilirubin: 0.5 mg/dL (ref 0.2–1.2)
Total Protein: 7.5 g/dL (ref 6.0–8.3)

## 2024-01-10 LAB — HEMOGLOBIN A1C: Hgb A1c MFr Bld: 6.7 % — ABNORMAL HIGH (ref 4.6–6.5)

## 2024-01-10 LAB — LIPID PANEL
Cholesterol: 161 mg/dL (ref 0–200)
HDL: 70.7 mg/dL (ref >39.00)
LDL Cholesterol: 60 mg/dL (ref 0–99)
NonHDL: 90.78
Total CHOL/HDL Ratio: 2
Triglycerides: 152 mg/dL — ABNORMAL HIGH (ref 0.0–149.0)
VLDL: 30.4 mg/dL (ref 0.0–40.0)

## 2024-01-10 LAB — MICROALBUMIN / CREATININE URINE RATIO
Creatinine,U: 66.5 mg/dL
Microalb Creat Ratio: UNDETERMINED mg/g (ref 0.0–30.0)
Microalb, Ur: <0.7 mg/dL

## 2024-01-10 LAB — TSH: TSH: 1.36 u[IU]/mL (ref 0.35–5.50)

## 2024-01-10 NOTE — Assessment & Plan Note (Signed)
Chronic DEXA up-to-date Encourage as much exercise as possible Continue calcium and vitamin D

## 2024-01-10 NOTE — Assessment & Plan Note (Signed)
 Chronic Multiple joints Continue meloxicam  daily as needed - not taking daily

## 2024-01-10 NOTE — Assessment & Plan Note (Signed)
 Chronic Blood pressure slightly elevated  Cmp, cbc Continue triamterene -HCTZ 75-50 mg tablet-1/2 tablet daily, losartan  50 mg daily

## 2024-01-10 NOTE — Assessment & Plan Note (Signed)
 Chronic Associated  with hyperlipidemia  Lab Results  Component Value Date   HGBA1C 6.5 07/04/2023   Sugars well controlled Check A1c, urine albumin/cr ratio Continue Lifestyle control Stressed regular exercise, diabetic diet

## 2024-01-10 NOTE — Assessment & Plan Note (Signed)
 Chronic Regular exercise and healthy diet encouraged Check lipid panel, cmp Continue pravastatin  40 mg daily, fenofibrate  145 mg daily

## 2024-01-13 ENCOUNTER — Ambulatory Visit: Payer: Self-pay | Admitting: Internal Medicine

## 2024-01-15 NOTE — Progress Notes (Signed)
 Assessment: -Postmenopausal atrophic vaginal changes, on Estrace  cream  -Recurrent urinary tract infections, no recent UTIs   Plan: -I will have her come back on an as needed basis  - Continue estrogen cream.  I will let her know that she can get this refilled by her PCP, Glade Hope  History of Present Illness:  7.21.2025: Initial visit for evaluation of recurrent urinary tract infections.  She has been treated for 3 symptomatic UTIs in the past year.  Typically she has increasing frequency and some dysuria.  No gross hematuria, fever, lateralizing flank or abdominal pain.  1 UTI made her feel uncomfortable in her arms and legs, however.  Prior to last year, she denies longstanding issues with infections.  Typically she has a good stream and empties out well.  10.20.2025: Here for recheck.  She is on estrogen cream 2 nights a week.  Also on cranberry capsules.  No recent UTIs.  Urinalysis Past Medical History:  Past Medical History:  Diagnosis Date   Breast cancer (HCC) left-2004 sx   Diabetes mellitus    TYPE 2   Diverticulitis    09/14/12   Diverticulosis    Heart murmur    History of radiation therapy    History of urinary tract infection    Hyperlipidemia    Hypertension    Osteoporosis    Fosamax  Rxed 2/13   Personal history of colonic adenomas 11/15/2012   Personal history of radiation therapy 06/2002   Pneumonia    2013   Seasonal allergies    Tingling    right leg     Past Surgical History:  Past Surgical History:  Procedure Laterality Date   ABDOMINAL HYSTERECTOMY  1965   ovaries remain; for dysfunctional menses; ? endometriosis   ABDOMINAL SURGERY     BREAST BIOPSY Left 04/16/2002   malignant   BREAST LUMPECTOMY Left    colon polyps  2014   Dr Avram   COLONOSCOPY  2004    Tics   DILATION AND CURETTAGE OF UTERUS     LAPAROSCOPIC SIGMOID COLECTOMY  04/13/2013   DR TOTH   LAPAROSCOPIC SIGMOID COLECTOMY N/A 04/13/2013   Procedure: LAPAROSCOPIC  ASSISTED SIGMOID COLECTOMY;  Surgeon: Deward GORMAN Curvin DOUGLAS, MD;  Location: [facility] OR;  Service: General;  Laterality: N/A;   LUMBAR LAMINECTOMY/DECOMPRESSION MICRODISCECTOMY N/A 02/19/2015   Procedure: MICRODISCECTOMY AT L4-L5, CENTRAL DECOMPRESSION L4-L5 FOR SPINAL STENOSIS, FORAMINOTOMY FOR L4 L5 ROOT ON RIGHT;  Surgeon: Tanda Heading, MD;  Location: [facility];  Service: Orthopedics;  Laterality: N/A;   removal bone spur Left 2/14   2nd finger   TONSILLECTOMY AND ADENOIDECTOMY     TOTAL KNEE ARTHROPLASTY Left 06/01/2022   Procedure: TOTAL KNEE ARTHROPLASTY;  Surgeon: Ernie Cough, MD;  Location: [facility];  Service: Orthopedics;  Laterality: Left;    Allergies:  Allergies  Allergen Reactions   Celestone  [Betamethasone ] Nausea Only    Sweated; face and chest with redness for several days     Family History:  Family History  Problem Relation Age of Onset   Stroke Mother 19   Hypertension Mother    Lung cancer Father        smoker   Cancer - Lung Father    Hypertension Brother    Heart disease Brother 65   Stroke Brother 68   Hypertension Brother    Hypertension Sister    Diabetes Neg Hx    Colon cancer Neg Hx    Esophageal cancer Neg Hx  Rectal cancer Neg Hx    Stomach cancer Neg Hx    Colon polyps Neg Hx     Social History:  Social History   Tobacco Use   Smoking status: Never   Smokeless tobacco: Never  Vaping Use   Vaping status: Never Used  Substance Use Topics   Alcohol  use: No    Alcohol /week: 0.0 standard drinks of alcohol    Drug use: No     Results:  Urinalysis today clear

## 2024-01-16 ENCOUNTER — Ambulatory Visit (INDEPENDENT_AMBULATORY_CARE_PROVIDER_SITE_OTHER): Admitting: Urology

## 2024-01-16 VITALS — BP 151/67 | HR 71 | Ht 61.0 in | Wt 150.0 lb

## 2024-01-16 DIAGNOSIS — N952 Postmenopausal atrophic vaginitis: Secondary | ICD-10-CM

## 2024-01-16 DIAGNOSIS — N390 Urinary tract infection, site not specified: Secondary | ICD-10-CM

## 2024-01-16 DIAGNOSIS — Z87440 Personal history of urinary (tract) infections: Secondary | ICD-10-CM

## 2024-01-16 LAB — URINALYSIS, ROUTINE W REFLEX MICROSCOPIC
Bilirubin, UA: NEGATIVE
Glucose, UA: NEGATIVE
Ketones, UA: NEGATIVE
Nitrite, UA: NEGATIVE
Protein,UA: NEGATIVE
RBC, UA: NEGATIVE
Specific Gravity, UA: 1.02 (ref 1.005–1.030)
Urobilinogen, Ur: 0.2 mg/dL (ref 0.2–1.0)
pH, UA: 7 (ref 5.0–7.5)

## 2024-01-16 LAB — MICROSCOPIC EXAMINATION

## 2024-02-21 ENCOUNTER — Other Ambulatory Visit: Payer: Self-pay | Admitting: Internal Medicine

## 2024-03-01 ENCOUNTER — Ambulatory Visit: Payer: Self-pay

## 2024-03-01 ENCOUNTER — Ambulatory Visit: Admitting: Emergency Medicine

## 2024-03-01 VITALS — BP 138/80 | HR 86 | Temp 97.8°F | Ht 61.0 in | Wt 149.0 lb

## 2024-03-01 DIAGNOSIS — R051 Acute cough: Secondary | ICD-10-CM | POA: Insufficient documentation

## 2024-03-01 DIAGNOSIS — J069 Acute upper respiratory infection, unspecified: Secondary | ICD-10-CM

## 2024-03-01 MED ORDER — BENZONATATE 100 MG PO CAPS: 100.0000 mg | ORAL_CAPSULE | Freq: Three times a day (TID) | ORAL | 1 refills | Status: AC | PRN

## 2024-03-01 NOTE — Patient Instructions (Signed)

## 2024-03-01 NOTE — Progress Notes (Signed)
 Tiffany Hood Finder 83 y.o.   Chief Complaint  Patient presents with   Cough    Pt states that she has little knots on both sides of her throat. Her throat has only been sore since last night     HISTORY OF PRESENT ILLNESS: Acute problem visit today. This is a 83 y.o. female complaining of flu like symptoms started 5 days ago Mostly complaining of cough, sore throat, and swollen cervical nodes No other associated symptoms No other complaints or medical concerns today.  Cough Pertinent negatives include no chest pain, chills, fever, headaches or rash.     Prior to Admission medications   Medication Sig Start Date End Date Taking? Authorizing Provider  Biotin w/ Vitamins C & E (HAIR SKIN & NAILS GUMMIES PO) Take 2 each by mouth in the morning.   Yes [provider]  Cranberry-Vitamin C (AZO CRANBERRY URINARY TRACT) 250-60 MG CAPS  07/05/23  Yes [provider]  cycloSPORINE (RESTASIS) 0.05 % ophthalmic emulsion Place 1 drop into both eyes in the morning and at bedtime.   Yes [provider]  diclofenac Sodium (VOLTAREN) 1 % GEL Apply 2 g topically 4 (four) times daily as needed (pain/swelling (leg/knee)).   Yes [provider]  estradiol  (ESTRACE ) 0.1 MG/GM vaginal cream Apply a pea-sized amount to vaginal opening using index finger 2-3 nights/week 10/18/23  Yes Dahlstedt, Garnette, MD  fenofibrate  (TRICOR ) 145 MG tablet TAKE 1 TABLET BY MOUTH DAILY 02/21/24  Yes Burns, Glade PARAS, MD  Lancets [facility] ULTRASOFT) lancets USE AS INSTRUCTED TO CHECK  BLOOD SUGARS DAILY 12/01/17  Yes Burns, Glade PARAS, MD  losartan  (COZAAR ) 50 MG tablet TAKE 1 TABLET BY MOUTH DAILY 06/30/23  Yes Burns, Glade PARAS, MD  meloxicam  (MOBIC ) 7.5 MG tablet TAKE 1 TABLET BY MOUTH DAILY 10/05/23  Yes Burns, Glade PARAS, MD  metroNIDAZOLE  (METROGEL ) 0.75 % gel Apply 1 Application topically daily as needed (facial redness). 12/09/20  Yes [provider]  Multiple Vitamins-Minerals (OCUVITE  PRESERVISION PO) Take 1 tablet by mouth in the morning.   Yes [provider]  ONE TOUCH ULTRA TEST test strip USE TO CHECK BLOOD SUGAR  DAILY 12/01/17  Yes Burns, Glade PARAS, MD  Polyethyl Glycol-Propyl Glycol 0.4-0.3 % SOLN Place 1 drop into both eyes 2 (two) times daily as needed (dry/irritated eyes).   Yes [provider]  pravastatin  (PRAVACHOL ) 40 MG tablet TAKE 1 TABLET BY MOUTH DAILY 09/01/23  Yes Burns, Glade PARAS, MD  triamterene -hydrochlorothiazide  (MAXZIDE ) 75-50 MG tablet TAKE ONE-HALF TABLET BY MOUTH  DAILY 11/07/23  Yes Burns, Glade PARAS, MD  vitamin C (ASCORBIC ACID) 250 MG tablet Take 250 mg by mouth in the morning.   Yes [provider]    Allergies  Allergen Reactions   Celestone  [Betamethasone ] Nausea Only    Sweated; face and chest with redness for several days     Patient Active Problem List   Diagnosis Date Noted   Frequent UTI 09/22/2023   Elevated LFTs 11/26/2022   Arthralgia 11/26/2022   Fatigue 11/26/2022   Rash 11/26/2022   Other headache syndrome 11/26/2022   Urinary frequency 10/26/2022   S/P total knee arthroplasty, left 06/01/2022   Glaucoma 04/10/2021   Osteoarthritis of left knee 04/10/2021   Chronic neuropathic pain 12/18/2020   Pain in right knee 02/10/2018   Seasonal allergic rhinitis 09/20/2016   Multiple thyroid  nodules --stable for 5 years-no follow-up needed 08/12/2016   Type 2 diabetes mellitus with hyperlipidemia (HCC) 08/08/2015  Spinal stenosis, lumbar region, with neurogenic claudication 02/19/2015   Osteopenia 03/30/2013   Diverticulitis of sigmoid colon s/p sigmoidectomy 10/05/2012   Diverticulosis 10/19/2011   Osteoarthritis 01/28/2010   Hyperlipidemia associated with type 2 diabetes mellitus (HCC) 12/25/2007   Hypertension associated with type 2 diabetes mellitus (HCC) 02/10/2007   History of cancer of left breast 05/02/2002    Past Medical History:  Diagnosis Date   Breast cancer (HCC) left-2004 sx   Diabetes  mellitus    TYPE 2   Diverticulitis    09/14/12   Diverticulosis    Heart murmur    History of radiation therapy    History of urinary tract infection    Hyperlipidemia    Hypertension    Osteoporosis    Fosamax  Rxed 2/13   Personal history of colonic adenomas 11/15/2012   Personal history of radiation therapy 06/2002   Pneumonia    2013   Seasonal allergies    Tingling    right leg     Past Surgical History:  Procedure Laterality Date   ABDOMINAL HYSTERECTOMY  1965   ovaries remain; for dysfunctional menses; ? endometriosis   ABDOMINAL SURGERY     BREAST BIOPSY Left 04/16/2002   malignant   BREAST LUMPECTOMY Left    colon polyps  2014   Dr Avram   COLONOSCOPY  2004    Tics   DILATION AND CURETTAGE OF UTERUS     LAPAROSCOPIC SIGMOID COLECTOMY  04/13/2013   DR TOTH   LAPAROSCOPIC SIGMOID COLECTOMY N/A 04/13/2013   Procedure: LAPAROSCOPIC ASSISTED SIGMOID COLECTOMY;  Surgeon: Deward Hood Curvin DOUGLAS, MD;  Location: [facility] OR;  Service: General;  Laterality: N/A;   LUMBAR LAMINECTOMY/DECOMPRESSION MICRODISCECTOMY N/A 02/19/2015   Procedure: MICRODISCECTOMY AT L4-L5, CENTRAL DECOMPRESSION L4-L5 FOR SPINAL STENOSIS, FORAMINOTOMY FOR L4 L5 ROOT ON RIGHT;  Surgeon: Tanda Heading, MD;  Location: [facility];  Service: Orthopedics;  Laterality: N/A;   removal bone spur Left 2/14   2nd finger   TONSILLECTOMY AND ADENOIDECTOMY     TOTAL KNEE ARTHROPLASTY Left 06/01/2022   Procedure: TOTAL KNEE ARTHROPLASTY;  Surgeon: Ernie Cough, MD;  Location: [facility];  Service: Orthopedics;  Laterality: Left;    Social History   Socioeconomic History   Marital status: Widowed    Spouse name: Not on file   Number of children: 1   Years of education: Not on file   Highest education level: 12th grade  Occupational History   Occupation: Retired  Tobacco Use   Smoking status: Never   Smokeless tobacco: Never  Vaping Use   Vaping status: Never Used  Substance and Sexual Activity   Alcohol  use: No     Alcohol /week: 0.0 standard drinks of alcohol    Drug use: No   Sexual activity: Not on file  Other Topics Concern   Not on file  Social History Narrative   Exercise: no   Lives alone.   Social Drivers of Corporate Investment Banker Strain: Low Risk  (03/01/2024)   Overall Financial Resource Strain (CARDIA)    Difficulty of Paying Living Expenses: Not hard at all  Food Insecurity: No Food Insecurity (03/01/2024)   Hunger Vital Sign    Worried About Running Out of Food in the Last Year: Never true    Ran Out of Food in the Last Year: Never true  Transportation Needs: No Transportation Needs (03/01/2024)   PRAPARE - Administrator, Civil Service (Medical): No    Lack of Transportation (Non-Medical):  No  Physical Activity: Insufficiently Active (03/01/2024)   Exercise Vital Sign    Days of Exercise per Week: 1 day    Minutes of Exercise per Session: 30 min  Stress: Stress Concern Present (03/01/2024)   [employer] of Occupational Health - Occupational Stress Questionnaire    Feeling of Stress: To some extent  Social Connections: Moderately Integrated (03/01/2024)   Social Connection and Isolation Panel    Frequency of Communication with Friends and Family: More than three times a week    Frequency of Social Gatherings with Friends and Family: Once a week    Attends Religious Services: More than 4 times per year    Active Member of [employer] or Organizations: Yes    Attends Banker Meetings: Not on file    Marital Status: Widowed  Intimate Partner Violence: Patient Unable To Answer (01/07/2023)   Humiliation, Afraid, Rape, and Kick questionnaire    Fear of Current or Ex-Partner: Patient unable to answer    Emotionally Abused: Patient unable to answer    Physically Abused: Patient unable to answer    Sexually Abused: Patient unable to answer    Family History  Problem Relation Age of Onset   Stroke Mother 79   Hypertension Mother    Lung cancer Father         smoker   Cancer - Lung Father    Hypertension Brother    Heart disease Brother 76   Stroke Brother 51   Hypertension Brother    Hypertension Sister    Diabetes Neg Hx    Colon cancer Neg Hx    Esophageal cancer Neg Hx    Rectal cancer Neg Hx    Stomach cancer Neg Hx    Colon polyps Neg Hx      Review of Systems  Constitutional: Negative.  Negative for chills and fever.  HENT:  Positive for congestion.   Respiratory:  Positive for cough.   Cardiovascular: Negative.  Negative for chest pain and palpitations.  Gastrointestinal:  Negative for abdominal pain, diarrhea, nausea and vomiting.  Genitourinary: Negative.  Negative for dysuria and hematuria.  Skin: Negative.  Negative for rash.  Neurological: Negative.  Negative for dizziness and headaches.  All other systems reviewed and are negative.   Vitals:   03/01/24 1317  BP: 138/80  Pulse: 86  Temp: 97.8 F (36.6 C)  SpO2: 93%    Physical Exam Vitals reviewed.  Constitutional:      Appearance: Normal appearance.  HENT:     Head: Normocephalic.     Mouth/Throat:     Mouth: Mucous membranes are moist.     Pharynx: Oropharynx is clear. No oropharyngeal exudate or posterior oropharyngeal erythema.  Eyes:     Extraocular Movements: Extraocular movements intact.     Pupils: Pupils are equal, round, and reactive to light.  Cardiovascular:     Rate and Rhythm: Normal rate and regular rhythm.     Pulses: Normal pulses.     Heart sounds: Normal heart sounds.  Pulmonary:     Effort: Pulmonary effort is normal.     Breath sounds: Normal breath sounds.  Musculoskeletal:     Cervical back: No tenderness.  Lymphadenopathy:     Cervical: Cervical adenopathy present.  Skin:    General: Skin is warm and dry.  Neurological:     General: No focal deficit present.     Mental Status: She is alert and oriented to person, place, and time.  Psychiatric:  Mood and Affect: Mood normal.        Behavior: Behavior normal.       ASSESSMENT & PLAN: Problem List Items Addressed This Visit       Respiratory   Upper respiratory infection, viral - Primary   Clinically stable.  Running its course without complications No red flag signs or symptoms Advised to rest and stay well-hydrated Symptom management discussed Recommend over-the-counter Mucinex DM and cough drops Tessalon  100 mg 3 times daily as needed NyQuil at bedtime as needed Advised to contact the office if no better or worse during the next several days.        Other   Acute cough   Cough management discussed Recommend over-the-counter Mucinex DM and cough drops Tessalon  100 mg 3 times daily as needed NyQuil at bedtime Advised to rest and stay well-hydrated      Relevant Medications   benzonatate  (TESSALON ) 100 MG capsule   Patient Instructions  Upper Respiratory Infection, Adult An upper respiratory infection (URI) affects the nose, throat, and upper airways that lead to the lungs. The most common type of URI is often called the common cold. URIs usually get better on their own, without medical treatment. What are the causes? A URI is caused by a germ (virus). You may catch these germs by: Breathing in droplets from an infected person's cough or sneeze. Touching something that has the germ on it (is contaminated) and then touching your mouth, nose, or eyes. What increases the risk? You are more likely to get a URI if: You are very young or very old. You have close contact with others, such as at work, school, or a health care facility. You smoke. You have long-term (chronic) heart or lung disease. You have a weakened disease-fighting system (immune system). You have nasal allergies or asthma. You have a lot of stress. You have poor nutrition. What are the signs or symptoms? Runny or stuffy (congested) nose. Cough. Sneezing. Sore throat. Headache. Feeling tired (fatigue). Fever. Not wanting to eat as much as usual. Pain in  your forehead, behind your eyes, and over your cheekbones (sinus pain). Muscle aches. Redness or irritation of the eyes. Pressure in the ears or face. How is this treated? URIs usually get better on their own within 7-10 days. Medicines cannot cure URIs, but your doctor may recommend certain medicines to help relieve symptoms, such as: Over-the-counter cold medicines. Medicines to reduce coughing (cough suppressants). Coughing is a type of defense against infection that helps to clear the nose, throat, windpipe, and lungs (respiratory system). Take these medicines only as told by your doctor. Medicines to lower your fever. Follow these instructions at home: Activity Rest as needed. If you have a fever, stay home from work or school until your fever is gone, or until your doctor says you may return to work or school. You should stay home until you cannot spread the infection anymore (you are not contagious). Your doctor may have you wear a face mask so you have less risk of spreading the infection. Relieving symptoms Rinse your mouth often with salt water . To make salt water , dissolve -1 tsp (3-6 g) of salt in 1 cup (237 mL) of warm water . Use a cool-mist humidifier to add moisture to the air. This can help you breathe more easily. Eating and drinking  Drink enough fluid to keep your pee (urine) pale yellow. Eat soups and other clear broths. General instructions  Take over-the-counter and prescription medicines only as told  by your doctor. Do not smoke or use any products that contain nicotine or tobacco. If you need help quitting, ask your doctor. Avoid being where people are smoking (avoid secondhand smoke). Stay up to date on all your shots (immunizations), and get the flu shot every year. Keep all follow-up visits. How to prevent the spread of infection to others  Wash your hands with soap and water  for at least 20 seconds. If you cannot use soap and water , use hand  sanitizer. Avoid touching your mouth, face, eyes, or nose. Cough or sneeze into a tissue or your sleeve or elbow. Do not cough or sneeze into your hand or into the air. Contact a doctor if: You are getting worse, not better. You have any of these: A fever or chills. Brown or red mucus in your nose. Yellow or brown fluid (discharge)coming from your nose. Pain in your face, especially when you bend forward. Swollen neck glands. Pain when you swallow. White areas in the back of your throat. Get help right away if: You have shortness of breath that gets worse. You have very bad or constant: Headache. Ear pain. Pain in your forehead, behind your eyes, and over your cheekbones (sinus pain). Chest pain. You have long-lasting (chronic) lung disease along with any of these: Making high-pitched whistling sounds when you breathe, most often when you breathe out (wheezing). Long-lasting cough (more than 14 days). Coughing up blood. A change in your usual mucus. You have a stiff neck. You have changes in your: Vision. Hearing. Thinking. Mood. These symptoms may be an emergency. Get help right away. Call 911. Do not wait to see if the symptoms will go away. Do not drive yourself to the hospital. Summary An upper respiratory infection (URI) is caused by a germ (virus). The most common type of URI is often called the common cold. URIs usually get better within 7-10 days. Take over-the-counter and prescription medicines only as told by your doctor. This information is not intended to replace advice given to you by your health care provider. Make sure you discuss any questions you have with your health care provider. Document Revised: 10/15/2020 Document Reviewed: 10/15/2020 Elsevier Patient Education  2024 Elsevier Inc.    Emil Schaumann, MD  Primary Care at [facility]

## 2024-03-01 NOTE — Telephone Encounter (Signed)
 FYI Only or Action Required?: FYI only for provider: appointment scheduled on 03/01/2024.  Patient was last seen in primary care on 01/10/2024 by Geofm Glade PARAS, MD.  Called Nurse Triage reporting Cough. And sore throat  Symptoms began several days ago.  Interventions attempted: OTC medications: robitussin and mucinex.  Symptoms are: gradually worsening.  Triage Disposition: see in office  Patient/caregiver understands and will follow disposition?: yes                     Copied from CRM #[id number]. Topic: Clinical - Red Word Triage >> Mar 01, 2024  8:13 AM Avram MATSU wrote: Red Word that prompted transfer to Nurse Triage: sore throat and really bad cough. Reason for Disposition  Cough  Answer Assessment - Initial Assessment Questions 1. ONSET: When did the cough begin?      Saturday 2. SEVERITY: How bad is the cough today?      Moderate 3. SPUTUM: Describe the color of your sputum (e.g., none, dry cough; clear, white, yellow, green)     unsure 4. HEMOPTYSIS: Are you coughing up any blood? If Yes, ask: How much? (e.g., flecks, streaks, tablespoons, etc.)     unsure 5. DIFFICULTY BREATHING: Are you having difficulty breathing? If Yes, ask: How bad is it? (e.g., mild, moderate, severe)      no 6. FEVER: Do you have a fever? If Yes, ask: What is your temperature, how was it measured, and when did it start?     no 7. CARDIAC HISTORY: Do you have any history of heart disease? (e.g., heart attack, congestive heart failure)      no 8. LUNG HISTORY: Do you have any history of lung disease?  (e.g., pulmonary embolus, asthma, emphysema)     no  10. OTHER SYMPTOMS: Do you have any other symptoms? (e.g., runny nose, wheezing, chest pain)       Sore throat  Protocols used: Cough - Acute Non-Productive-A-AH

## 2024-03-01 NOTE — Assessment & Plan Note (Signed)
 Clinically stable.  Running its course without complications No red flag signs or symptoms Advised to rest and stay well-hydrated Symptom management discussed Recommend over-the-counter Mucinex DM and cough drops Tessalon  100 mg 3 times daily as needed NyQuil at bedtime as needed Advised to contact the office if no better or worse during the next several days.

## 2024-03-01 NOTE — Assessment & Plan Note (Signed)
 Cough management discussed Recommend over-the-counter Mucinex DM and cough drops Tessalon  100 mg 3 times daily as needed NyQuil at bedtime Advised to rest and stay well-hydrated

## 2024-03-06 NOTE — Progress Notes (Unsigned)
 [facility] Cancer Follow up:    Tiffany Glade PARAS, MD [address] [place] [address]   DIAGNOSIS:    SUMMARY OF ONCOLOGIC HISTORY: Per Dr. Gara note from 01/30/2015: #1 T1c N0 breast cancer status post lumpectomy 05/07/2002. She went on to complete radiation therapy adjuvantly on 08/19/2012.   #2 patient took Arimidex 1 mg daily from June 2004 to June 2014.  CURRENT THERAPY: observation  INTERVAL HISTORY:  Discussed the use of AI scribe software for clinical note transcription with the patient, who gave verbal consent to proceed.  History of Present Illness      Patient Active Problem List   Diagnosis Date Noted   Acute cough 03/01/2024   Frequent UTI 09/22/2023   Elevated LFTs 11/26/2022   Arthralgia 11/26/2022   Fatigue 11/26/2022   Rash 11/26/2022   Other headache syndrome 11/26/2022   Urinary frequency 10/26/2022   Upper respiratory infection, viral 10/26/2022   S/P total knee arthroplasty, left 06/01/2022   Glaucoma 04/10/2021   Osteoarthritis of left knee 04/10/2021   Chronic neuropathic pain 12/18/2020   Pain in right knee 02/10/2018   Seasonal allergic rhinitis 09/20/2016   Multiple thyroid  nodules --stable for 5 years-no follow-up needed 08/12/2016   Type 2 diabetes mellitus with hyperlipidemia (HCC) 08/08/2015   Spinal stenosis, lumbar region, with neurogenic claudication 02/19/2015   Osteopenia 03/30/2013   Diverticulitis of sigmoid colon s/p sigmoidectomy 10/05/2012   Diverticulosis 10/19/2011   Osteoarthritis 01/28/2010   Hyperlipidemia associated with type 2 diabetes mellitus (HCC) 12/25/2007   Hypertension associated with type 2 diabetes mellitus (HCC) 02/10/2007   History of cancer of left breast 05/02/2002    is allergic to celestone  [betamethasone ].  MEDICAL HISTORY: Past Medical History:  Diagnosis Date   Breast cancer (HCC) left-2004 sx   Diabetes mellitus    TYPE 2   Diverticulitis    09/14/12    Diverticulosis    Heart murmur    History of radiation therapy    History of urinary tract infection    Hyperlipidemia    Hypertension    Osteoporosis    Fosamax  Rxed 2/13   Personal history of colonic adenomas 11/15/2012   Personal history of radiation therapy 06/2002   Pneumonia    2013   Seasonal allergies    Tingling    right leg     SURGICAL HISTORY: Past Surgical History:  Procedure Laterality Date   ABDOMINAL HYSTERECTOMY  1965   ovaries remain; for dysfunctional menses; ? endometriosis   ABDOMINAL SURGERY     BREAST BIOPSY Left 04/16/2002   malignant   BREAST LUMPECTOMY Left    colon polyps  2014   Dr Avram   COLONOSCOPY  2004    Tics   DILATION AND CURETTAGE OF UTERUS     LAPAROSCOPIC SIGMOID COLECTOMY  04/13/2013   DR TOTH   LAPAROSCOPIC SIGMOID COLECTOMY N/A 04/13/2013   Procedure: LAPAROSCOPIC ASSISTED SIGMOID COLECTOMY;  Surgeon: Deward GORMAN Curvin DOUGLAS, MD;  Location: [facility] OR;  Service: General;  Laterality: N/A;   LUMBAR LAMINECTOMY/DECOMPRESSION MICRODISCECTOMY N/A 02/19/2015   Procedure: MICRODISCECTOMY AT L4-L5, CENTRAL DECOMPRESSION L4-L5 FOR SPINAL STENOSIS, FORAMINOTOMY FOR L4 L5 ROOT ON RIGHT;  Surgeon: Tanda Heading, MD;  Location: [facility] ORS;  Service: Orthopedics;  Laterality: N/A;   removal bone spur Left 2/14   2nd finger   TONSILLECTOMY AND ADENOIDECTOMY     TOTAL KNEE ARTHROPLASTY Left 06/01/2022   Procedure: TOTAL KNEE ARTHROPLASTY;  Surgeon: Ernie Cough, MD;  Location: [facility];  Service: Orthopedics;  Laterality: Left;    SOCIAL HISTORY: Social History   Socioeconomic History   Marital status: Widowed    Spouse name: Not on file   Number of children: 1   Years of education: Not on file   Highest education level: 12th grade  Occupational History   Occupation: Retired  Tobacco Use   Smoking status: Never   Smokeless tobacco: Never  Vaping Use   Vaping status: Never Used  Substance and Sexual Activity   Alcohol  use: No    Alcohol /week: 0.0  standard drinks of alcohol    Drug use: No   Sexual activity: Not on file  Other Topics Concern   Not on file  Social History Narrative   Exercise: no   Lives alone.   Social Drivers of Corporate Investment Banker Strain: Low Risk  (03/01/2024)   Overall Financial Resource Strain (CARDIA)    Difficulty of Paying Living Expenses: Not hard at all  Food Insecurity: No Food Insecurity (03/01/2024)   Hunger Vital Sign    Worried About Running Out of Food in the Last Year: Never true    Ran Out of Food in the Last Year: Never true  Transportation Needs: No Transportation Needs (03/01/2024)   PRAPARE - Administrator, Civil Service (Medical): No    Lack of Transportation (Non-Medical): No  Physical Activity: Insufficiently Active (03/01/2024)   Exercise Vital Sign    Days of Exercise per Week: 1 day    Minutes of Exercise per Session: 30 min  Stress: Stress Concern Present (03/01/2024)   [employer] of Occupational Health - Occupational Stress Questionnaire    Feeling of Stress: To some extent  Social Connections: Moderately Integrated (03/01/2024)   Social Connection and Isolation Panel    Frequency of Communication with Friends and Family: More than three times a week    Frequency of Social Gatherings with Friends and Family: Once a week    Attends Religious Services: More than 4 times per year    Active Member of [employer] or Organizations: Yes    Attends Banker Meetings: Not on file    Marital Status: Widowed  Intimate Partner Violence: Patient Unable To Answer (01/07/2023)   Humiliation, Afraid, Rape, and Kick questionnaire    Fear of Current or Ex-Partner: Patient unable to answer    Emotionally Abused: Patient unable to answer    Physically Abused: Patient unable to answer    Sexually Abused: Patient unable to answer    FAMILY HISTORY: Family History  Problem Relation Age of Onset   Stroke Mother 83   Hypertension Mother    Lung cancer Father         smoker   Cancer - Lung Father    Hypertension Brother    Heart disease Brother 65   Stroke Brother 91   Hypertension Brother    Hypertension Sister    Diabetes Neg Hx    Colon cancer Neg Hx    Esophageal cancer Neg Hx    Rectal cancer Neg Hx    Stomach cancer Neg Hx    Colon polyps Neg Hx     Review of Systems  Constitutional:  Negative for appetite change, chills, fatigue, fever and unexpected weight change.  HENT:   Negative for hearing loss, lump/mass and trouble swallowing.   Eyes:  Negative for eye problems and icterus.  Respiratory:  Negative for chest tightness, cough and shortness of breath.  Cardiovascular:  Negative for chest pain, leg swelling and palpitations.  Gastrointestinal:  Negative for abdominal distention, abdominal pain, constipation, diarrhea, nausea and vomiting.  Endocrine: Negative for hot flashes.  Genitourinary:  Negative for difficulty urinating.   Musculoskeletal:  Negative for arthralgias.  Skin:  Negative for itching and rash.  Neurological:  Negative for dizziness, extremity weakness, headaches and numbness.  Hematological:  Negative for adenopathy. Does not bruise/bleed easily.  Psychiatric/Behavioral:  Negative for depression. The patient is not nervous/anxious.       PHYSICAL EXAMINATION    There were no vitals filed for this visit.  Physical Exam Constitutional:      General: She is not in acute distress.    Appearance: Normal appearance. She is not toxic-appearing.  HENT:     Head: Normocephalic and atraumatic.     Mouth/Throat:     Mouth: Mucous membranes are moist.     Pharynx: Oropharynx is clear. No oropharyngeal exudate or posterior oropharyngeal erythema.  Eyes:     General: No scleral icterus. Cardiovascular:     Rate and Rhythm: Normal rate and regular rhythm.     Pulses: Normal pulses.     Heart sounds: Normal heart sounds.  Pulmonary:     Effort: Pulmonary effort is normal.     Breath sounds: Normal breath  sounds.  Abdominal:     General: Abdomen is flat. Bowel sounds are normal. There is no distension.     Palpations: Abdomen is soft.     Tenderness: There is no abdominal tenderness.  Musculoskeletal:        General: No swelling.     Cervical back: Neck supple.  Lymphadenopathy:     Cervical: No cervical adenopathy.  Skin:    General: Skin is warm and dry.     Findings: No rash.  Neurological:     General: No focal deficit present.     Mental Status: She is alert.  Psychiatric:        Mood and Affect: Mood normal.        Behavior: Behavior normal.     LABORATORY DATA:  CBC    Component Value Date/Time   WBC 7.6 01/10/2024 1344   RBC 4.58 01/10/2024 1344   HGB 13.3 01/10/2024 1344   HGB 13.8 01/30/2015 1406   HCT 39.9 01/10/2024 1344   HCT 41.1 01/30/2015 1406   PLT 268.0 01/10/2024 1344   PLT 238 01/30/2015 1406   MCV 87.1 01/10/2024 1344   MCV 85.6 01/30/2015 1406   MCH 29.3 06/02/2022 0338   MCHC 33.3 01/10/2024 1344   RDW 14.8 01/10/2024 1344   RDW 14.7 (H) 01/30/2015 1406   LYMPHSABS 1.7 07/04/2023 1342   LYMPHSABS 1.7 01/30/2015 1406   MONOABS 0.6 07/04/2023 1342   MONOABS 0.7 01/30/2015 1406   EOSABS 0.4 07/04/2023 1342   EOSABS 0.3 01/30/2015 1406   BASOSABS 0.1 07/04/2023 1342   BASOSABS 0.1 01/30/2015 1406    CMP     Component Value Date/Time   NA 137 01/10/2024 1344   NA 139 01/30/2015 1406   K 3.9 01/10/2024 1344   K 3.7 01/30/2015 1406   CL 100 01/10/2024 1344   CO2 27 01/10/2024 1344   CO2 27 01/30/2015 1406   GLUCOSE 94 01/10/2024 1344   GLUCOSE 113 01/30/2015 1406   BUN 23 01/10/2024 1344   BUN 29.0 (H) 01/30/2015 1406   CREATININE 0.83 01/10/2024 1344   CREATININE 0.64 10/09/2019 1123   CREATININE 0.8 01/30/2015 1406  CALCIUM 10.6 (H) 01/10/2024 1344   CALCIUM 10.1 01/30/2015 1406   PROT 7.5 01/10/2024 1344   PROT 7.1 01/30/2015 1406   ALBUMIN 4.8 01/10/2024 1344   ALBUMIN 3.8 01/30/2015 1406   AST 20 01/10/2024 1344   AST 15  01/30/2015 1406   ALT 16 01/10/2024 1344   ALT 15 01/30/2015 1406   ALKPHOS 44 01/10/2024 1344   ALKPHOS 75 01/30/2015 1406   BILITOT 0.5 01/10/2024 1344   BILITOT 0.45 01/30/2015 1406   GFRNONAA >60 06/02/2022 0338   GFRAA >60 02/18/2015 1130     ASSESSMENT and THERAPY PLAN:   No problem-specific Assessment & Plan notes found for this encounter.   Assessment and Plan Assessment & Plan       All questions were answered. The patient knows to call the clinic with any problems, questions or concerns. We can certainly see the patient much sooner if necessary.  Total encounter time:*** minutes*in face-to-face visit time, chart review, lab review, care coordination, order entry, and documentation of the encounter time.    Morna Kendall, NP 03/06/24 11:26 AM Medical Oncology and Hematology [facility] [address] [place], [place] [address] Tel. [phone]    Fax. [phone]  *Total Encounter Time as defined by the Centers for Medicare and Medicaid Services includes, in addition to the face-to-face time of a patient visit (documented in the note above) non-face-to-face time: obtaining and reviewing outside history, ordering and reviewing medications, tests or procedures, care coordination (communications with other health care professionals or caregivers) and documentation in the medical record.

## 2024-03-07 ENCOUNTER — Inpatient Hospital Stay: Payer: Medicare Other | Attending: Adult Health | Admitting: Adult Health

## 2024-03-07 ENCOUNTER — Encounter: Payer: Self-pay | Admitting: Adult Health

## 2024-03-07 VITALS — BP 130/76 | HR 89 | Temp 98.2°F | Resp 18 | Wt 149.1 lb

## 2024-03-07 DIAGNOSIS — Z853 Personal history of malignant neoplasm of breast: Secondary | ICD-10-CM

## 2024-03-07 DIAGNOSIS — M8589 Other specified disorders of bone density and structure, multiple sites: Secondary | ICD-10-CM

## 2024-03-07 DIAGNOSIS — M810 Age-related osteoporosis without current pathological fracture: Secondary | ICD-10-CM | POA: Diagnosis not present

## 2024-03-07 DIAGNOSIS — Z08 Encounter for follow-up examination after completed treatment for malignant neoplasm: Secondary | ICD-10-CM | POA: Insufficient documentation

## 2024-03-09 NOTE — Assessment & Plan Note (Addendum)
 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  She continues on observation alone.  1.  History of breast cancer: She has no clinical or radiographic signs of breast cancer recurrence.  She will continue annual mammograms.  I recommended healthy diet and exercise.  She prefers to return annually for a clinical breast exam here at the cancer center which we are happy to accommodate.  2.  Osteopenia: She has a history of osteopenia and was previously on an aromatase inhibitor which can impact bone health.  She is due for repeat bone density testing in 2026.  I placed orders for this today.  She continues to do calcium and vitamin D .  I recommended that Tiffany Hood continue to follow-up with her primary care provider regularly.  We will see her back in 1 year for continued long-term follow-up.

## 2024-05-01 ENCOUNTER — Other Ambulatory Visit: Payer: Self-pay | Admitting: Internal Medicine

## 2024-07-10 ENCOUNTER — Ambulatory Visit: Admitting: Internal Medicine

## 2025-03-07 ENCOUNTER — Inpatient Hospital Stay: Admitting: Adult Health
